# Patient Record
Sex: Male | Born: 1955 | Race: White | Hispanic: No | Marital: Married | Smoking: Never smoker
Health system: Southern US, Community
[De-identification: ages and names within clinical notes are randomized; demographics above are authoritative.]

## PROBLEM LIST (undated history)

## (undated) DIAGNOSIS — Z7901 Long term (current) use of anticoagulants: Secondary | ICD-10-CM

## (undated) DIAGNOSIS — Z9081 Acquired absence of spleen: Secondary | ICD-10-CM

## (undated) DIAGNOSIS — R0600 Dyspnea, unspecified: Secondary | ICD-10-CM

## (undated) DIAGNOSIS — J189 Pneumonia, unspecified organism: Secondary | ICD-10-CM

## (undated) DIAGNOSIS — M7701 Medial epicondylitis, right elbow: Secondary | ICD-10-CM

## (undated) DIAGNOSIS — E785 Hyperlipidemia, unspecified: Secondary | ICD-10-CM

## (undated) DIAGNOSIS — K219 Gastro-esophageal reflux disease without esophagitis: Secondary | ICD-10-CM

## (undated) DIAGNOSIS — I471 Supraventricular tachycardia, unspecified: Secondary | ICD-10-CM

## (undated) DIAGNOSIS — T4145XA Adverse effect of unspecified anesthetic, initial encounter: Secondary | ICD-10-CM

## (undated) DIAGNOSIS — R0989 Other specified symptoms and signs involving the circulatory and respiratory systems: Secondary | ICD-10-CM

## (undated) DIAGNOSIS — T466X5A Adverse effect of antihyperlipidemic and antiarteriosclerotic drugs, initial encounter: Secondary | ICD-10-CM

## (undated) DIAGNOSIS — K449 Diaphragmatic hernia without obstruction or gangrene: Secondary | ICD-10-CM

## (undated) DIAGNOSIS — T783XXA Angioneurotic edema, initial encounter: Secondary | ICD-10-CM

## (undated) DIAGNOSIS — I5189 Other ill-defined heart diseases: Secondary | ICD-10-CM

## (undated) DIAGNOSIS — R7303 Prediabetes: Secondary | ICD-10-CM

## (undated) DIAGNOSIS — C884 Extranodal marginal zone b-cell lymphoma of mucosa-associated lymphoid tissue (malt-lymphoma) not having achieved remission: Secondary | ICD-10-CM

## (undated) DIAGNOSIS — G43109 Migraine with aura, not intractable, without status migrainosus: Secondary | ICD-10-CM

## (undated) DIAGNOSIS — I2510 Atherosclerotic heart disease of native coronary artery without angina pectoris: Secondary | ICD-10-CM

## (undated) DIAGNOSIS — G4730 Sleep apnea, unspecified: Secondary | ICD-10-CM

## (undated) DIAGNOSIS — T8859XA Other complications of anesthesia, initial encounter: Secondary | ICD-10-CM

## (undated) DIAGNOSIS — M75102 Unspecified rotator cuff tear or rupture of left shoulder, not specified as traumatic: Secondary | ICD-10-CM

## (undated) DIAGNOSIS — G2581 Restless legs syndrome: Secondary | ICD-10-CM

## (undated) DIAGNOSIS — G4700 Insomnia, unspecified: Secondary | ICD-10-CM

## (undated) DIAGNOSIS — I82890 Acute embolism and thrombosis of other specified veins: Secondary | ICD-10-CM

## (undated) DIAGNOSIS — K5792 Diverticulitis of intestine, part unspecified, without perforation or abscess without bleeding: Secondary | ICD-10-CM

## (undated) DIAGNOSIS — G5711 Meralgia paresthetica, right lower limb: Secondary | ICD-10-CM

## (undated) DIAGNOSIS — Z9289 Personal history of other medical treatment: Secondary | ICD-10-CM

## (undated) DIAGNOSIS — G4733 Obstructive sleep apnea (adult) (pediatric): Secondary | ICD-10-CM

## (undated) DIAGNOSIS — K5790 Diverticulosis of intestine, part unspecified, without perforation or abscess without bleeding: Secondary | ICD-10-CM

## (undated) DIAGNOSIS — R3129 Other microscopic hematuria: Secondary | ICD-10-CM

## (undated) DIAGNOSIS — Z8489 Family history of other specified conditions: Secondary | ICD-10-CM

## (undated) DIAGNOSIS — Z87442 Personal history of urinary calculi: Secondary | ICD-10-CM

## (undated) DIAGNOSIS — M791 Myalgia, unspecified site: Secondary | ICD-10-CM

## (undated) DIAGNOSIS — C8590 Non-Hodgkin lymphoma, unspecified, unspecified site: Secondary | ICD-10-CM

## (undated) HISTORY — DX: Non-Hodgkin lymphoma, unspecified, unspecified site: C85.90

## (undated) HISTORY — DX: Diaphragmatic hernia without obstruction or gangrene: K44.9

## (undated) HISTORY — DX: Meralgia paresthetica, right lower limb: G57.11

## (undated) HISTORY — PX: HERNIA REPAIR: SHX51

## (undated) HISTORY — DX: Gastro-esophageal reflux disease without esophagitis: K21.9

## (undated) HISTORY — PX: SMALL INTESTINE SURGERY: SHX150

## (undated) HISTORY — DX: Diverticulosis of intestine, part unspecified, without perforation or abscess without bleeding: K57.90

## (undated) HISTORY — DX: Medial epicondylitis, right elbow: M77.01

## (undated) HISTORY — PX: SPLENECTOMY: SUR1306

## (undated) HISTORY — DX: Other microscopic hematuria: R31.29

## (undated) HISTORY — DX: Hyperlipidemia, unspecified: E78.5

## (undated) HISTORY — DX: Personal history of other medical treatment: Z92.89

## (undated) HISTORY — DX: Diverticulitis of intestine, part unspecified, without perforation or abscess without bleeding: K57.92

## (undated) HISTORY — DX: Other specified symptoms and signs involving the circulatory and respiratory systems: R09.89

## (undated) HISTORY — DX: Atherosclerotic heart disease of native coronary artery without angina pectoris: I25.10

## (undated) HISTORY — PX: COLONOSCOPY: SHX174

---
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
## 2005-12-28 ENCOUNTER — Ambulatory Visit: Payer: Self-pay | Admitting: Unknown Physician Specialty

## 2007-01-26 ENCOUNTER — Ambulatory Visit (HOSPITAL_COMMUNITY): Admission: RE | Admit: 2007-01-26 | Discharge: 2007-01-26 | Payer: Self-pay | Admitting: Cardiology

## 2007-01-26 ENCOUNTER — Ambulatory Visit: Payer: Self-pay | Admitting: Vascular Surgery

## 2007-12-21 HISTORY — PX: OTHER SURGICAL HISTORY: SHX169

## 2008-03-02 LAB — HM COLONOSCOPY: HM Colonoscopy: NORMAL

## 2008-09-26 DIAGNOSIS — R5381 Other malaise: Secondary | ICD-10-CM

## 2008-09-26 DIAGNOSIS — E785 Hyperlipidemia, unspecified: Secondary | ICD-10-CM

## 2008-09-26 DIAGNOSIS — I10 Essential (primary) hypertension: Secondary | ICD-10-CM

## 2008-09-26 DIAGNOSIS — K219 Gastro-esophageal reflux disease without esophagitis: Secondary | ICD-10-CM | POA: Insufficient documentation

## 2008-09-26 DIAGNOSIS — R5383 Other fatigue: Secondary | ICD-10-CM

## 2008-10-01 ENCOUNTER — Ambulatory Visit: Payer: Self-pay | Admitting: Internal Medicine

## 2008-10-01 DIAGNOSIS — G4730 Sleep apnea, unspecified: Secondary | ICD-10-CM

## 2008-10-01 DIAGNOSIS — K625 Hemorrhage of anus and rectum: Secondary | ICD-10-CM

## 2008-10-04 ENCOUNTER — Telehealth: Payer: Self-pay | Admitting: Internal Medicine

## 2008-10-09 ENCOUNTER — Ambulatory Visit: Payer: Self-pay | Admitting: Internal Medicine

## 2008-10-18 ENCOUNTER — Telehealth: Payer: Self-pay | Admitting: Internal Medicine

## 2008-10-24 ENCOUNTER — Encounter: Payer: Self-pay | Admitting: Internal Medicine

## 2008-11-13 ENCOUNTER — Ambulatory Visit: Payer: Self-pay | Admitting: Internal Medicine

## 2008-11-13 DIAGNOSIS — K3189 Other diseases of stomach and duodenum: Secondary | ICD-10-CM

## 2008-11-13 DIAGNOSIS — R1013 Epigastric pain: Secondary | ICD-10-CM

## 2008-12-26 ENCOUNTER — Encounter: Payer: Self-pay | Admitting: Internal Medicine

## 2009-03-31 ENCOUNTER — Telehealth: Payer: Self-pay | Admitting: Internal Medicine

## 2009-04-09 ENCOUNTER — Ambulatory Visit: Payer: Self-pay | Admitting: Internal Medicine

## 2009-04-09 DIAGNOSIS — R197 Diarrhea, unspecified: Secondary | ICD-10-CM

## 2009-06-04 ENCOUNTER — Ambulatory Visit: Payer: Self-pay | Admitting: Internal Medicine

## 2009-07-07 ENCOUNTER — Encounter: Admission: RE | Admit: 2009-07-07 | Discharge: 2009-07-07 | Payer: Self-pay | Admitting: Orthopedic Surgery

## 2009-08-18 ENCOUNTER — Inpatient Hospital Stay (HOSPITAL_COMMUNITY): Admission: RE | Admit: 2009-08-18 | Discharge: 2009-08-21 | Payer: Self-pay | Admitting: Surgery

## 2009-09-18 ENCOUNTER — Encounter: Payer: Self-pay | Admitting: Internal Medicine

## 2011-03-26 LAB — CBC
Hemoglobin: 13.4 g/dL (ref 13.0–17.0)
MCHC: 34.3 g/dL (ref 30.0–36.0)
Platelets: 227 10*3/uL (ref 150–400)
RDW: 14 % (ref 11.5–15.5)

## 2011-03-27 LAB — BASIC METABOLIC PANEL
Calcium: 9.5 mg/dL (ref 8.4–10.5)
GFR calc non Af Amer: >60 mL/min (ref >60)
Glucose, Bld: 87 mg/dL (ref 70–99)
Sodium: 141 mEq/L (ref 135–145)

## 2011-03-27 LAB — HEMOGLOBIN AND HEMATOCRIT, BLOOD: Hemoglobin: 14.5 g/dL (ref 13.0–17.0)

## 2011-05-04 NOTE — Op Note (Signed)
Brian Atkins, Brian Atkins                 ACCOUNT NO.:  [id number]   MEDICAL RECORD NO.:  [id number]          PATIENT TYPE:  AMB   LOCATION:  DAY                          FACILITY:  [facility]   PHYSICIAN:  Thornton Park. Daphine Deutscher, MD  DATE OF BIRTH:  [birth year]   DATE OF PROCEDURE:  08/18/2009  DATE OF DISCHARGE:                               OPERATIVE REPORT   PREOPERATIVE DIAGNOSIS:  Gastroesophageal reflux with small hiatal  hernia.   POSTOPERATIVE DIAGNOSIS:  Gastroesophageal reflux with small hiatal  hernia.   PROCEDURE:  Laparoscopic repair of hiatal hernia with three posterior  pledgeted sutures and a three suture Nissen wrap.   FINDINGS:  Evidence of moderate gastroesophageal reflux with  inflammatory changes in the EG junction.   SURGEON:  Dr. Daphine Deutscher   ASSISTANT:  Dr. Freida Busman   ANESTHESIA:  General endotracheal.   DESCRIPTION OF PROCEDURE:  Brian Atkins is a 52-year man taken to room  one on Monday, August 18, 2009 and given general anesthesia.  The  abdomen was prepped with a Techni-Care equivalent and draped sterilely.  I entered the abdomen through the left upper quadrant and 0 degree  OptiView without difficulty.  After entering, a 5 was placed lateral to  that, a 5 the upper midline for the [facility] retractor, two 11s on the  right and one slightly to the left umbilicus for the camera.   With the [facility] in place, I began the foregut dissection.  I took  down the gastrohepatic window.  I clipped first the small vessel and  there was no evidence of any change in the liver before I harmonized  that and then opened up the window completely up.  I went posterior  along the right crus and opened that window and then took down the  phrenoesophageal ligament and completely mobilized the esophagus  anteriorly and posteriorly including the left crus.  I took down short  gastrics and mobilized the cardia and a portion of the fundus.   I then placed three sutures with pledgeted sutures  posteriorly for the  closure and before tying the last one, I put in the 56 lighted bougie  and then secured the last one with tie knot.  I then brought the wrap  around the distal esophagus inside the vagus nerve which was lying  outside the wrap and this stayed in position and was under no tension.  I then found a contiguous portion of the stomach and wrapped it suturing  in place esophagus with three sutures using these 2-0 Surgitek and the  Endo stitch with tie knots in the top two and a free tie on the bottom  one.  I also tacked the left side of the stomach down to the stomach to  kind of prevent migration up behind the wrapped portion of the stomach  on the left.   There was no bleeding.  Everything looked good.  Pictures were taken and  the area was irrigated and the [place] removed.  Port sites were all  injected with Marcaine and closed 4-0 Vicryl with Benzoin and Steri-  Strips.  The patient tolerated the procedure well and was taken to the  recovery room in satisfactory condition.      Thornton Park Daphine Deutscher, MD  Electronically Signed     MBM/MEDQ  D:  08/18/2009  T:  08/18/2009  Job:  [id number]   cc:   Wilhemina Bonito. Marina Goodell, MD  [address]  [place]  [place] [address]

## 2011-06-07 ENCOUNTER — Other Ambulatory Visit: Payer: Self-pay | Admitting: Cardiology

## 2011-08-02 ENCOUNTER — Other Ambulatory Visit: Payer: Self-pay | Admitting: *Deleted

## 2011-08-02 MED ORDER — CARVEDILOL 6.25 MG PO TABS: 6.2500 mg | ORAL_TABLET | Freq: Two times a day (BID) | ORAL | Status: DC

## 2011-08-02 NOTE — Telephone Encounter (Signed)
escribe medication per fax request  

## 2011-10-04 ENCOUNTER — Ambulatory Visit: Payer: Self-pay | Admitting: Family Medicine

## 2011-10-25 ENCOUNTER — Ambulatory Visit (INDEPENDENT_AMBULATORY_CARE_PROVIDER_SITE_OTHER): Payer: BC Managed Care – PPO | Admitting: Internal Medicine

## 2011-10-25 ENCOUNTER — Encounter: Payer: Self-pay | Admitting: Internal Medicine

## 2011-10-25 VITALS — BP 112/74 | HR 70 | Temp 98.3°F | Resp 16 | Ht 73.5 in | Wt 201.8 lb

## 2011-10-25 DIAGNOSIS — M7701 Medial epicondylitis, right elbow: Secondary | ICD-10-CM

## 2011-10-25 DIAGNOSIS — M7700 Medial epicondylitis, unspecified elbow: Secondary | ICD-10-CM

## 2011-10-25 DIAGNOSIS — R5381 Other malaise: Secondary | ICD-10-CM

## 2011-10-25 DIAGNOSIS — I10 Essential (primary) hypertension: Secondary | ICD-10-CM

## 2011-10-25 DIAGNOSIS — E785 Hyperlipidemia, unspecified: Secondary | ICD-10-CM

## 2011-10-25 DIAGNOSIS — K2970 Gastritis, unspecified, without bleeding: Secondary | ICD-10-CM

## 2011-10-25 DIAGNOSIS — L918 Other hypertrophic disorders of the skin: Secondary | ICD-10-CM

## 2011-10-25 DIAGNOSIS — G5711 Meralgia paresthetica, right lower limb: Secondary | ICD-10-CM

## 2011-10-25 DIAGNOSIS — L909 Atrophic disorder of skin, unspecified: Secondary | ICD-10-CM

## 2011-10-25 DIAGNOSIS — R5383 Other fatigue: Secondary | ICD-10-CM

## 2011-10-25 DIAGNOSIS — E162 Hypoglycemia, unspecified: Secondary | ICD-10-CM

## 2011-10-25 DIAGNOSIS — G5710 Meralgia paresthetica, unspecified lower limb: Secondary | ICD-10-CM

## 2011-10-25 LAB — COMPREHENSIVE METABOLIC PANEL
ALT: 20 U/L (ref 0–53)
AST: 21 U/L (ref 0–37)
Albumin: 4.4 g/dL (ref 3.5–5.2)
BUN: 17 mg/dL (ref 6–23)
Calcium: 9.2 mg/dL (ref 8.4–10.5)
Chloride: 103 mEq/L (ref 96–112)
Potassium: 4.5 mEq/L (ref 3.5–5.1)

## 2011-10-25 LAB — LIPID PANEL
Cholesterol: 140 mg/dL (ref 0–200)
LDL Cholesterol: 83 mg/dL (ref 0–99)
Total CHOL/HDL Ratio: 4

## 2011-10-25 MED ORDER — HYOSCYAMINE SULFATE 0.125 MG SL SUBL: 0.1250 mg | SUBLINGUAL_TABLET | SUBLINGUAL | Status: DC | PRN

## 2011-10-25 MED ORDER — DEXLANSOPRAZOLE 30 MG PO CPDR: 30.0000 mg | DELAYED_RELEASE_CAPSULE | Freq: Every day | ORAL | Status: AC

## 2011-10-25 NOTE — Patient Instructions (Addendum)
Resume kapidex or dexilant daily .  Add 20 mg pepcid (famotidine) at bedtime if needed,  Ok to [employer] for immediate relief of reflux,  But for the cramping  Try hyoscyamine sublingual tablet   If no improvement in 2 weeks,  We should order a barium swallow and followup with Dr. Hyacinth Meeker  Add 500 mg of tylenol two times daily for the shoulder  (2000 mg total daily)

## 2011-10-25 NOTE — Progress Notes (Signed)
Subjective:    Patient ID: Brian Atkins, male    DOB: Dec 09, 1956, 55 y.o.   MRN: [id number]  HPI  55 yo male with a history of htn, hyperlipidemia,  Hiatal hernia s/p Nissen fundoplication 2009, presents for establishment of primary care.  Has several complaints: recurrence of reflux after being mostly symptom free for over 2 years. He describes reflux and epsiodes of severe substernal chest pain occurring 1 hr after eating.  He has had 6 such episodes since his surgery. He has a remote history of a normal stress test and recently walked up [employer] 1.5 hr trail and had no chest pain during event.   2nd complaint is that his 2nd and 3rd toes turn blue in the winter, only in the left foot. There is some pain associated with the color change.  Had a doppler ultrasound done by Clelia Croft several months ago and it was reportedly  normal.   HIs 3rd issue is persistent right anterior thigh numbness for the past 6 months.  He had an evaluation by neurologist Dr. Clelia Croft who diagnosed lateral femoral cutaneous nerve syndrome.  He has not seen an improvement but conitnue to wear belts and constrictive undergarments due to misunderstanding.  His 4th complaint is left shoulder soreness as well as pain his his right elbow.  NO history of trauma.  He is taking naproxen daily  Which provides minimal relief.    Review of Systems  Constitutional: Negative for fever, chills, diaphoresis, activity change, appetite change, fatigue and unexpected weight change.  HENT: Negative for hearing loss, ear pain, nosebleeds, congestion, sore throat, facial swelling, rhinorrhea, sneezing, drooling, mouth sores, trouble swallowing, neck pain, neck stiffness, dental problem, voice change, postnasal drip, sinus pressure, tinnitus and ear discharge.   Eyes: Negative for photophobia, pain, discharge, redness, itching and visual disturbance.  Respiratory: Negative for apnea, cough, choking, chest tightness, shortness of breath, wheezing and  stridor.   Cardiovascular: Positive for chest pain. Negative for palpitations and leg swelling.  Gastrointestinal: Negative for nausea, vomiting, abdominal pain, diarrhea, constipation, blood in stool, abdominal distention, anal bleeding and rectal pain.  Genitourinary: Negative for dysuria, urgency, frequency, hematuria, flank pain, decreased urine volume, scrotal swelling, difficulty urinating and testicular pain.  Musculoskeletal: Positive for arthralgias. Negative for myalgias, back pain, joint swelling and gait problem.  Skin: Positive for color change. Negative for rash and wound.  Neurological: Positive for numbness. Negative for dizziness, tremors, seizures, syncope, speech difficulty, weakness, light-headedness and headaches.  Psychiatric/Behavioral: Negative for suicidal ideas, hallucinations, behavioral problems, confusion, sleep disturbance, dysphoric mood, decreased concentration and agitation. The patient is not nervous/anxious.        Objective:   Physical Exam  Constitutional: He is oriented to person, place, and time.  HENT:  Head: Normocephalic and atraumatic.  Mouth/Throat: Oropharynx is clear and moist.  Eyes: Conjunctivae and EOM are normal.  Neck: Normal range of motion. Neck supple. No JVD present. No thyromegaly present.  Cardiovascular: Normal rate, regular rhythm, normal heart sounds and normal pulses.        Cap refill sluggish left foot toes 2 and 3  Pulmonary/Chest: Effort normal and breath sounds normal. He has no wheezes. He has no rales.  Abdominal: Soft. Bowel sounds are normal. He exhibits no mass. There is no tenderness. There is no rebound.  Musculoskeletal: Normal range of motion. He exhibits tenderness. He exhibits no edema.       Arms: Neurological: He is alert and oriented to person, place, and time.  Skin: Skin is warm and dry.  Psychiatric: He has a normal mood and affect.      Notable fo rdecreased cap refill in ttoes 2 and 3    Assessment  & Plan:  Chest pain:  Unlikely to be cardiac given its timing.   May be esophageal spasm from recurrent reflux vs problem with the Nissen fundoplication. Resume PPI, a trial of hyoscyamine , consider workup for CREST given and refer back to GI  May need barium swallow but will need to discuss with radiology.

## 2011-10-26 ENCOUNTER — Encounter: Payer: Self-pay | Admitting: Internal Medicine

## 2011-10-26 DIAGNOSIS — M7701 Medial epicondylitis, right elbow: Secondary | ICD-10-CM | POA: Insufficient documentation

## 2011-10-26 DIAGNOSIS — R3121 Asymptomatic microscopic hematuria: Secondary | ICD-10-CM | POA: Insufficient documentation

## 2011-10-26 DIAGNOSIS — R0989 Other specified symptoms and signs involving the circulatory and respiratory systems: Secondary | ICD-10-CM | POA: Insufficient documentation

## 2011-10-26 DIAGNOSIS — G5711 Meralgia paresthetica, right lower limb: Secondary | ICD-10-CM | POA: Insufficient documentation

## 2011-10-26 DIAGNOSIS — K649 Unspecified hemorrhoids: Secondary | ICD-10-CM | POA: Insufficient documentation

## 2011-10-26 DIAGNOSIS — R3129 Other microscopic hematuria: Secondary | ICD-10-CM | POA: Insufficient documentation

## 2011-10-26 DIAGNOSIS — E785 Hyperlipidemia, unspecified: Secondary | ICD-10-CM | POA: Insufficient documentation

## 2011-10-26 LAB — TESTOSTERONE, FREE, TOTAL, SHBG: Testosterone-% Free: 2.1 % (ref 1.6–2.9)

## 2011-10-26 NOTE — Assessment & Plan Note (Signed)
LDL 80, HDL 24 in Sept 2011.  Repeat lipids are  Due/

## 2011-10-26 NOTE — Assessment & Plan Note (Signed)
currenlty well controlled on carvedilol and losartan/ no changes

## 2011-10-26 NOTE — Assessment & Plan Note (Signed)
He was treated with a steroid taper in Late July with improvement in symptoms but continues to overuse his joints at work.

## 2011-10-26 NOTE — Assessment & Plan Note (Signed)
Explained source of problem and need for him to change to boxer shorts and suspenders (avoid belts) to giave the entrapped nervie a time to decomopresso  Minimum of 6 months

## 2011-11-23 ENCOUNTER — Other Ambulatory Visit: Payer: Self-pay | Admitting: Internal Medicine

## 2011-11-24 MED ORDER — CARVEDILOL 6.25 MG PO TABS: 6.2500 mg | ORAL_TABLET | Freq: Two times a day (BID) | ORAL | Status: DC

## 2011-11-29 ENCOUNTER — Telehealth: Payer: Self-pay | Admitting: Internal Medicine

## 2011-11-29 NOTE — Telephone Encounter (Signed)
Left message asking patient to return my call.

## 2011-11-29 NOTE — Telephone Encounter (Signed)
[phone]  Pt went to get rx at pharmacy and they told him that he need to make an appointment with his md.  He as an appointment on 12/07/11 for cpx. [facility] He needs refills on all his meds Does he need to come see dr [name] before 12/18  He was seen by dr [name] 10/25/11 Please call and advise pt,

## 2011-12-07 ENCOUNTER — Ambulatory Visit (INDEPENDENT_AMBULATORY_CARE_PROVIDER_SITE_OTHER): Payer: BC Managed Care – PPO | Admitting: Internal Medicine

## 2011-12-07 ENCOUNTER — Encounter: Payer: Self-pay | Admitting: Internal Medicine

## 2011-12-07 DIAGNOSIS — G4730 Sleep apnea, unspecified: Secondary | ICD-10-CM

## 2011-12-07 DIAGNOSIS — Z1211 Encounter for screening for malignant neoplasm of colon: Secondary | ICD-10-CM

## 2011-12-07 DIAGNOSIS — G4710 Hypersomnia, unspecified: Secondary | ICD-10-CM | POA: Insufficient documentation

## 2011-12-07 DIAGNOSIS — I10 Essential (primary) hypertension: Secondary | ICD-10-CM

## 2011-12-07 DIAGNOSIS — R1013 Epigastric pain: Secondary | ICD-10-CM

## 2011-12-07 DIAGNOSIS — Z125 Encounter for screening for malignant neoplasm of prostate: Secondary | ICD-10-CM

## 2011-12-07 DIAGNOSIS — E785 Hyperlipidemia, unspecified: Secondary | ICD-10-CM

## 2011-12-07 DIAGNOSIS — K3189 Other diseases of stomach and duodenum: Secondary | ICD-10-CM

## 2011-12-07 NOTE — Assessment & Plan Note (Signed)
DRE normal today,  Return for PSA on Friday

## 2011-12-07 NOTE — Assessment & Plan Note (Signed)
Done at age 55 for rectal bleeding.  Repeat at age 66

## 2011-12-07 NOTE — Progress Notes (Signed)
Subjective:    Patient ID: Daivik Overley, male    DOB: December 29, 1955, 55 y.o.   MRN: [id number]  HPI  Mr. Bognar is a healthy 55 yo white male with a history of hypertension, hiatal hernia and    BP 107/74  Pulse 78  Temp(Src) 98.7 F (37.1 C) (Oral)  Ht 6\' 1"  (1.854 m)  Wt 204 lb 4 oz (92.647 kg)  BMI 26.95 kg/m2  SpO2 97% recurrent left shoulder pain and bilteral elbow pain who present for his annual physical exam. 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 Relieved with occasional Alleve .No prior orthopedic evaluations or steroid injections but has had prior PT therapy which were not helpful .   2d complaint is decreased libido and weight gain .  Not exercising,  Falls asleep as soon as he comes home and eats a sandwhich, frequentl skips meals,  Snores a lot but no prior apnea test and sleeps in separate room from wife.   Past Medical History  Diagnosis Date  . Medial epicondylitis of right elbow   . Meralgia paresthetica of right side   . Microscopic hematuria   . Dyslipidemia   . Labile hypertension   . Hemorrhoids    Past Surgical History  Procedure Date  . Nissen funduplication 2009    Juliane Lack    Current Outpatient Prescriptions on File Prior to Visit  Medication Sig Dispense Refill  . carvedilol (COREG) 6.25 MG tablet Take 1 tablet (6.25 mg total) by mouth 2 (two) times daily.  60 tablet  2  . fluticasone (FLONASE) 50 MCG/ACT nasal spray Place 2 sprays into the nose daily.        [name] losartan-hydrochlorothiazide (HYZAAR) 100-25 MG per tablet Take 1 tablet by mouth daily.        . naproxen (NAPROSYN) 500 MG tablet Take 500 mg by mouth 2 (two) times daily as needed.       . simvastatin (ZOCOR) 20 MG tablet Take 20 mg by mouth at bedtime.           Review of Systems  Constitutional: Negative for fever, chills, diaphoresis, activity change, appetite change, fatigue and unexpected weight change.  HENT: Negative for hearing loss, ear pain,  nosebleeds, congestion, sore throat, facial swelling, rhinorrhea, sneezing, drooling, mouth sores, trouble swallowing, neck pain, neck stiffness, dental problem, voice change, postnasal drip, sinus pressure, tinnitus and ear discharge.   Eyes: Negative for photophobia, pain, discharge, redness, itching and visual disturbance.  Respiratory: Negative for apnea, cough, choking, chest tightness, shortness of breath, wheezing and stridor.   Cardiovascular: Negative for chest pain, palpitations and leg swelling.  Gastrointestinal: Negative for nausea, vomiting, abdominal pain, diarrhea, constipation, blood in stool, abdominal distention, anal bleeding and rectal pain.  Genitourinary: Negative for dysuria, urgency, frequency, hematuria, flank pain, decreased urine volume, scrotal swelling, difficulty urinating and testicular pain.  Musculoskeletal: Negative for myalgias, back pain, joint swelling, arthralgias and gait problem.  Skin: Negative for color change, rash and wound.  Neurological: Negative for dizziness, tremors, seizures, syncope, speech difficulty, weakness, light-headedness, numbness and headaches.  Psychiatric/Behavioral: Negative for suicidal ideas, hallucinations, behavioral problems, confusion, sleep disturbance, dysphoric mood, decreased concentration and agitation. The patient is not nervous/anxious.        BP 107/74  Pulse 78  Temp(Src) 98.7 F (37.1 C) (Oral)  Ht 6\' 1"  (1.854 m)  Wt 204 lb 4 oz (92.647 kg)  BMI 26.95 kg/m2  SpO2 97%  Objective:   Physical Exam  Constitutional: He is oriented to person, place, and time.  HENT:  Head: Normocephalic and atraumatic.  Mouth/Throat: Oropharynx is clear and moist.  Eyes: Conjunctivae and EOM are normal.  Neck: Normal range of motion. Neck supple. No JVD present. No thyromegaly present.  Cardiovascular: Normal rate, regular rhythm and normal heart sounds.   Pulmonary/Chest: Effort normal and breath sounds normal. He has no  wheezes. He has no rales.  Abdominal: Soft. Bowel sounds are normal. He exhibits no mass. There is no tenderness. There is no rebound.  Genitourinary: Prostate normal and penis normal.  Musculoskeletal: Normal range of motion. He exhibits no edema.  Neurological: He is alert and oriented to person, place, and time.  Skin: Skin is warm and dry.  Psychiatric: He has a normal mood and affect.       Assessment & Plan:   SLEEP APNEA Apparently patient has had a sleep study and does not want to use CPAP for dx.  Will recommend weight loss, consider repeat study in 6 months  HYPERTENSION Now well controlled. Normal renal function,  No changes  HYPERLIPIDEMIA Near goal of LDL 100 .  recommended mediterranean diet and starch restriction.   DYSPEPSIA His symptoms solved after last visit and  he is not using PPI on a regular basis despite my advice.   Special screening for malignant neoplasm of prostate DRE normal today,  Return for PSA on Friday  Screening for colon cancer Done at age 55 for rectal bleeding.  Repeat at age 55    Updated Medication List Outpatient Encounter Prescriptions as of 12/07/2011  Medication Sig Dispense Refill  . carvedilol (COREG) 6.25 MG tablet Take 1 tablet (6.25 mg total) by mouth 2 (two) times daily.  60 tablet  2  . fluticasone (FLONASE) 50 MCG/ACT nasal spray Place 2 sprays into the nose daily.        [name] losartan-hydrochlorothiazide (HYZAAR) 100-25 MG per tablet Take 1 tablet by mouth daily.        . naproxen (NAPROSYN) 500 MG tablet Take 500 mg by mouth 2 (two) times daily as needed.       . simvastatin (ZOCOR) 20 MG tablet Take 20 mg by mouth at bedtime.         Total visit time 45 minutes spent answering additional questions on chronic medical issues.

## 2011-12-07 NOTE — Assessment & Plan Note (Signed)
Apparently patient has had a sleep study and does not want to use CPAP for dx.  Will recommend weight loss, consider repeat study in 6 months

## 2011-12-07 NOTE — Assessment & Plan Note (Addendum)
His symptoms solved after last visit and  he is not using PPI on a regular basis despite my advice.

## 2011-12-07 NOTE — Assessment & Plan Note (Signed)
Near goal of LDL 100 .  recommended mediterranean diet and starch restriction.

## 2011-12-07 NOTE — Assessment & Plan Note (Signed)
Now well controlled. Normal renal function,  No changes

## 2012-02-02 ENCOUNTER — Emergency Department: Payer: Self-pay | Admitting: Emergency Medicine

## 2012-02-02 LAB — CBC
HCT: 47.7 % (ref 40.0–52.0)
MCH: 29.1 pg (ref 26.0–34.0)
MCHC: 34.5 g/dL (ref 32.0–36.0)
Platelet: 249 10*3/uL (ref 150–440)
RDW: 13.5 % (ref 11.5–14.5)
WBC: 12.4 10*3/uL — ABNORMAL HIGH (ref 3.8–10.6)

## 2012-02-02 LAB — COMPREHENSIVE METABOLIC PANEL
Alkaline Phosphatase: 54 U/L (ref 50–136)
Anion Gap: 10 (ref 7–16)
BUN: 24 mg/dL — ABNORMAL HIGH (ref 7–18)
Bilirubin,Total: 0.5 mg/dL (ref 0.2–1.0)
EGFR (Non-African Amer.): >60
Glucose: 114 mg/dL — ABNORMAL HIGH (ref 65–99)
Potassium: 3.9 mmol/L (ref 3.5–5.1)
SGOT(AST): 24 U/L (ref 15–37)
SGPT (ALT): 23 U/L
Sodium: 143 mmol/L (ref 136–145)
Total Protein: 7.9 g/dL (ref 6.4–8.2)

## 2012-02-03 LAB — CK TOTAL AND CKMB (NOT AT ARMC): CK, Total: 61 U/L (ref 35–232)

## 2012-02-03 LAB — TROPONIN I: Troponin-I: <0.02 ng/mL

## 2012-03-06 ENCOUNTER — Other Ambulatory Visit: Payer: Self-pay | Admitting: Internal Medicine

## 2012-03-06 MED ORDER — CARVEDILOL 6.25 MG PO TABS: 6.2500 mg | ORAL_TABLET | Freq: Two times a day (BID) | ORAL | Status: DC

## 2012-06-29 ENCOUNTER — Other Ambulatory Visit: Payer: Self-pay | Admitting: *Deleted

## 2012-06-29 MED ORDER — LOSARTAN POTASSIUM-HCTZ 100-25 MG PO TABS: 1.0000 | ORAL_TABLET | Freq: Every day | ORAL | Status: DC

## 2012-06-30 ENCOUNTER — Telehealth: Payer: Self-pay | Admitting: Internal Medicine

## 2012-06-30 MED ORDER — LOSARTAN POTASSIUM-HCTZ 100-25 MG PO TABS: 1.0000 | ORAL_TABLET | Freq: Every day | ORAL | Status: DC

## 2012-06-30 NOTE — Telephone Encounter (Signed)
Refill on Losartan 100-25 mg.

## 2012-06-30 NOTE — Telephone Encounter (Signed)
Rx called in 

## 2012-07-30 ENCOUNTER — Other Ambulatory Visit: Payer: Self-pay | Admitting: Internal Medicine

## 2012-11-05 ENCOUNTER — Other Ambulatory Visit: Payer: Self-pay | Admitting: Internal Medicine

## 2012-11-06 ENCOUNTER — Telehealth: Payer: Self-pay | Admitting: Internal Medicine

## 2012-11-06 MED ORDER — LOSARTAN POTASSIUM-HCTZ 100-25 MG PO TABS: 1.0000 | ORAL_TABLET | Freq: Every day | ORAL | Status: DC

## 2012-11-06 NOTE — Telephone Encounter (Signed)
Losartan -Hydrochlorothiazide 100-25 mg # 30 2R sent electronic to [facility].Patient advised that a follow up appt is need for further refills.

## 2012-11-06 NOTE — Telephone Encounter (Signed)
Will refer to Dr. [name]

## 2012-11-06 NOTE — Telephone Encounter (Signed)
Pt spouse called he is completely out of  Losartan hctz 100-25 [facility]

## 2012-11-06 NOTE — Telephone Encounter (Signed)
Losartan 100-25 mg # 30 2 R [facility].

## 2013-01-23 ENCOUNTER — Other Ambulatory Visit: Payer: Self-pay | Admitting: Internal Medicine

## 2013-01-23 ENCOUNTER — Other Ambulatory Visit: Payer: Self-pay | Admitting: General Practice

## 2013-01-23 MED ORDER — CARVEDILOL 6.25 MG PO TABS: ORAL_TABLET | ORAL | Status: DC

## 2013-03-02 ENCOUNTER — Encounter: Payer: Self-pay | Admitting: Internal Medicine

## 2013-03-02 ENCOUNTER — Ambulatory Visit (INDEPENDENT_AMBULATORY_CARE_PROVIDER_SITE_OTHER): Payer: No Typology Code available for payment source | Admitting: Internal Medicine

## 2013-03-02 VITALS — BP 112/78 | HR 67 | Temp 98.0°F | Resp 16 | Ht 73.0 in | Wt 204.2 lb

## 2013-03-02 DIAGNOSIS — K219 Gastro-esophageal reflux disease without esophagitis: Secondary | ICD-10-CM

## 2013-03-02 DIAGNOSIS — G4730 Sleep apnea, unspecified: Secondary | ICD-10-CM

## 2013-03-02 DIAGNOSIS — R7309 Other abnormal glucose: Secondary | ICD-10-CM

## 2013-03-02 DIAGNOSIS — Z79899 Other long term (current) drug therapy: Secondary | ICD-10-CM

## 2013-03-02 DIAGNOSIS — Z13220 Encounter for screening for lipoid disorders: Secondary | ICD-10-CM

## 2013-03-02 DIAGNOSIS — Z125 Encounter for screening for malignant neoplasm of prostate: Secondary | ICD-10-CM

## 2013-03-02 LAB — COMPREHENSIVE METABOLIC PANEL
Albumin: 4.4 g/dL (ref 3.5–5.2)
Alkaline Phosphatase: 62 U/L (ref 39–117)
CO2: 27 mEq/L (ref 19–32)
Glucose, Bld: 93 mg/dL (ref 70–99)
Potassium: 4 mEq/L (ref 3.5–5.1)
Sodium: 138 mEq/L (ref 135–145)
Total Protein: 7.9 g/dL (ref 6.0–8.3)

## 2013-03-02 LAB — CBC WITH DIFFERENTIAL/PLATELET
Eosinophils Relative: 2.4 % (ref 0.0–5.0)
Lymphocytes Relative: 43.6 % (ref 12.0–46.0)
Monocytes Absolute: 0.6 10*3/uL (ref 0.1–1.0)
Monocytes Relative: 6.5 % (ref 3.0–12.0)
Neutrophils Relative %: 47 % (ref 43.0–77.0)
Platelets: 276 10*3/uL (ref 150.0–400.0)
WBC: 8.8 10*3/uL (ref 4.5–10.5)

## 2013-03-02 LAB — LIPID PANEL
Cholesterol: 212 mg/dL — ABNORMAL HIGH (ref 0–200)
Total CHOL/HDL Ratio: 8

## 2013-03-02 LAB — PSA: PSA: 1.92 ng/mL (ref 0.10–4.00)

## 2013-03-02 NOTE — Progress Notes (Signed)
Patient ID: Brian Atkins, male   DOB: August 26, 1956, 57 y.o.   MRN: [id number].  The patient is here for his annual male physical examination and management of other chronic and acute problems.   The risk factors are reflected in the social history.  The roster of all physicians providing medical care to patient - is listed in the Snapshot section of the chart.  Activities of daily living:  The patient is 100% independent in all ADLs: dressing, toileting, feeding as well as independent mobility  Home safety : The patient has smoke detectors in the home. He wears seatbelts.  There are no firearms at home. There is no violence in the home.   There is no risks for hepatitis, STDs or HIV. There is no   history of blood transfusion. There is no travel history to infectious disease endemic areas of the world.  The patient has seen their dentist in the last six month and  their eye doctor in the last year.  They do not  have excessive sun exposure. They have seen a dermatoloigist in the last year. Discussed the need for sun protection: hats, long sleeves and use of sunscreen if there is significant sun exposure.   Diet: the importance of a healthy diet is discussed. They do have a healthy diet.  The benefits of regular aerobic exercise were discussed. He exercises a minimum of 30 minutes  5 days per week. Depression screen: there are no signs or vegative symptoms of depression- irritability, change in appetite, anhedonia, sadness/tearfullness.  The following portions of the patient's history were reviewed and updated as appropriate: allergies, current medications, past family history, past medical history,  past surgical history, past social history  and problem list.  Visual acuity was not assessed per patient preference since he has regular follow up with his ophthalmologist. Hearing and body mass index were assessed and reviewed.   During the course of the visit the patient was educated and counseled  about appropriate screening and preventive services including :  nutrition counseling, colorectal cancer screening, and recommended immunizations.    Objective  BP 112/78  Pulse 67  Temp(Src) 98 F (36.7 C) (Oral)  Resp 16  Ht 6\' 1"  (1.854 m)  Wt 204 lb 4 oz (92.647 kg)  BMI 26.95 kg/m2  SpO2 95%  General Appearance:    Alert, cooperative, no distress, appears stated age  Head:    Normocephalic, without obvious abnormality, atraumatic  Eyes:    PERRL, conjunctiva/corneas clear, EOM's intact, fundi    benign, both eyes       Ears:    Normal TM's and external ear canals, both ears  Nose:   Nares normal, septum midline, mucosa normal, no drainage   or sinus tenderness  Throat:   Lips, mucosa, and tongue normal; teeth and gums normal  Neck:   Supple, symmetrical, trachea midline, no adenopathy;       thyroid:  No enlargement/tenderness/nodules; no carotid   bruit or JVD  Back:     Symmetric, no curvature, ROM normal, no CVA tenderness  Lungs:     Clear to auscultation bilaterally, respirations unlabored  Chest wall:    No tenderness or deformity  Heart:    Regular rate and rhythm, S1 and S2 normal, no murmur, rub   or gallop  Abdomen:     Soft, non-tender, bowel sounds active all four quadrants,    no masses, no organomegaly  Genitalia:    Normal male without lesion, discharge or  tenderness  Rectal:    Normal tone, normal prostate, no masses or tenderness;   guaiac negative stool  Extremities:   Extremities normal, atraumatic, no cyanosis or edema  Pulses:   2+ and symmetric all extremities  Skin:   Skin color, texture, turgor normal, no rashes or lesions  Lymph nodes:   Cervical, supraclavicular, and axillary nodes normal  Neurologic:   CNII-XII intact. Normal strength, sensation and reflexes      throughout   Assessment and Plan  Special screening for malignant neoplasm of prostate Annual male exam was done including testicular and prostate exam. PSA is < 2.0  Routine  general medical examination at a health care facility Annual male exam was done including testicular and prostate exam. PSA is pending .  Colon ca screening was reviewed and options given.    HYPERLIPIDEMIA His untreated cholesterol is fine except for a very low HDL. recommended Mediterranean lifestyle diet  HYPERTENSION Well controlled on current regimen. Renal function stable, no changes today.  GERD Recommended daily use of a PPI or H2 blocker for management of symptoms.  SLEEP APNEA Managed with weight loss as patient refused to use CPAP for treatment. He currently has no symptoms.   Updated Medication List Outpatient Encounter Prescriptions as of 03/02/2013  Medication Sig Dispense Refill  . carvedilol (COREG) 6.25 MG tablet TAKE ONE TABLET BY MOUTH TWICE DAILY  120 tablet  0  . fluticasone (FLONASE) 50 MCG/ACT nasal spray Place 2 sprays into the nose daily.        [name] losartan-hydrochlorothiazide (HYZAAR) 100-25 MG per tablet Take 1 tablet by mouth daily.  30 tablet  2  . naproxen (NAPROSYN) 500 MG tablet Take 500 mg by mouth 2 (two) times daily as needed.       . [DISCONTINUED] simvastatin (ZOCOR) 20 MG tablet Take 20 mg by mouth at bedtime.        No facility-administered encounter medications on file as of 03/02/2013.

## 2013-03-02 NOTE — Patient Instructions (Addendum)
We did your annual exam today and everything was normal  We are checking cholesterol, PSA, thyroid, etc and you will be notified of your results  Regarding your stomach complaints"  Try Mylanta Gas for the episode  of belly pain. Slow down eating,  Talk  less during eating (swallow less air )  And try resuming nexium or prilosec   Regarding your low blood sugars ,  I will check a hgba1c today , and you may want to try the following diet   This is  One version of a  "Low GI"  Diet:  It is not ultra low carb, but will still lower your blood sugars and allow you to lose 5 to 15lbs per month if you follow it carefully and combine it with 30 minutes of aeroric exercise 5 days per week .   All of the foods can be found at grocery stores and in bulk at [employer].  The Atkins protein bars and shakes are available in more varieties at [employer], [employer] and [employer].     7 AM Breakfast:  Low carbohydrate Protein  Shakes (I recommend the EAS AdvantEdge "Carb Control" shakes  Or the low carb shakes by Atkins.   Both are available everywhere:  In  cases at BJs  Or in 4 packs at grocery stores and pharmacies  2.5 carbs  (Alternative is  a toasted Arnold's Sandwhich Thin w/ peanut butter, a "Bagel Thin" with cream cheese and salmon) or  a scrambled egg burrito made with a low carb tortilla .  Avoid cereal and bananas, oatmeal too unless you are cooking the old fashioned kind that takes 30-40 minutes to prepare.  the rest is overly processed, has minimal fiber, and is loaded with carbohydrates!   10 AM: Protein bar by Atkins (the snack size, under 200 cal).  There are many varieties , available widely again or in bulk in limited varieties at BJs)  Other so called "protein bars" tend to be loaded with carbohydrates.  Remember, in food advertising, the word "energy" is synonymous for " carbohydrate."  Lunch: sandwich of [place], (or any lunchmeat, grilled meat or canned tuna), fresh avocado, mayonnaise  and  cheese on a lower carbohydrate pita bread, flatbread, or tortilla . Ok to use regular mayonnaise. The bread is the only source or carbohydrate that can be decreased (Joseph's makes a pita bread and a flat bread that are 50 cal and 4 net carbs ; Toufayan makes a low carb flatbread that's 100 cal and 9 net carbs  and  Mission makes a low carb whole wheat tortilla  That is 210 cal and 6 net carbs)  3 PM:  Mid day :  Another protein bar,  Or a  cheese stick (100 cal, 0 carbs),  Or 1 ounce of  almonds, walnuts, pistachios, pecans, peanuts,  Macadamia nuts. Or a Dannon light n Fit greek yogurt, 80 cal 8 net carbs . Avoid "granola"; the dried cranberries and raisins are loaded with carbohydrates. Mixed nuts ok if no raisins or cranberries or dried fruit.      6 PM  Dinner:  "mean and green:"  Meat/chicken/fish or a high protein legume; , with a green salad, and a low GI  Veggie (broccoli, cauliflower, green beans, spinach, brussel sprouts. Lima beans) : Avoid "Low fat dressings, as well as Reyne Dumas and [address]! They are loaded with sugar! Instead use ranch, vinagrette,  Blue cheese, etc.  There is a low carb pasta  by Dreamfield's available at [facility] grocery that is acceptable and tastes great. Try Michel Angelo's chicken piccata over low carb pasta. The chicken dish is 0 carbs, and can be found in frozen section at BJs and Lowe's. Also try [employer]" (pulled pork, no sauce,  0 carbs) and his pot roast.   both are in the refrigerated section at BJs   Dreamfield's makes a low carb pasta only 5 g/serving.  Available at all grocery stores,  And tastes like normal pasta  9 PM snack : Breyer's "low carb" fudgsicle or  ice cream bar (Carb Smart line), or  Weight Watcher's ice cream bar , or another "no sugar added" ice cream;a serving of fresh berries/cherries with whipped cream (Avoid bananas, pineapple, grapes  and watermelon on a regular basis because they are high in sugar)   Remember that  snack Substitutions should be less than 10 carbs per serving and meals < 20 carbs. Remember to subtract fiber grams and sugar alcohols to get the "net carbs."

## 2013-03-04 DIAGNOSIS — Z0001 Encounter for general adult medical examination with abnormal findings: Secondary | ICD-10-CM | POA: Insufficient documentation

## 2013-03-04 NOTE — Assessment & Plan Note (Signed)
Annual male exam was done including testicular and prostate exam. PSA is pending .  Colon ca screening was reviewed and options given.   

## 2013-03-04 NOTE — Assessment & Plan Note (Signed)
Managed with weight loss as patient refused to use CPAP for treatment. He currently has no symptoms.

## 2013-03-04 NOTE — Assessment & Plan Note (Signed)
Recommended daily use of a PPI or H2 blocker for management of symptoms.

## 2013-03-04 NOTE — Assessment & Plan Note (Addendum)
His untreated cholesterol is fine except for a very low HDL. recommended Mediterranean lifestyle diet

## 2013-03-04 NOTE — Assessment & Plan Note (Signed)
Annual male exam was done including testicular and prostate exam. PSA is < 2.0

## 2013-03-04 NOTE — Assessment & Plan Note (Signed)
Well controlled on current regimen. Renal function stable, no changes today. 

## 2013-03-11 ENCOUNTER — Other Ambulatory Visit: Payer: Self-pay | Admitting: Internal Medicine

## 2013-04-08 ENCOUNTER — Other Ambulatory Visit: Payer: Self-pay | Admitting: Internal Medicine

## 2013-04-09 NOTE — Telephone Encounter (Signed)
Rx sent to pharmacy by escript  

## 2013-07-14 ENCOUNTER — Other Ambulatory Visit: Payer: Self-pay | Admitting: Internal Medicine

## 2013-10-17 ENCOUNTER — Other Ambulatory Visit: Payer: Self-pay | Admitting: Internal Medicine

## 2013-10-23 ENCOUNTER — Encounter: Payer: Self-pay | Admitting: Internal Medicine

## 2013-10-23 ENCOUNTER — Ambulatory Visit (INDEPENDENT_AMBULATORY_CARE_PROVIDER_SITE_OTHER): Payer: BC Managed Care – PPO | Admitting: Internal Medicine

## 2013-10-23 VITALS — BP 118/78 | HR 67 | Temp 97.5°F | Resp 12 | Ht 73.0 in | Wt 203.0 lb

## 2013-10-23 DIAGNOSIS — M25429 Effusion, unspecified elbow: Secondary | ICD-10-CM

## 2013-10-23 DIAGNOSIS — M25421 Effusion, right elbow: Secondary | ICD-10-CM | POA: Insufficient documentation

## 2013-10-23 NOTE — Progress Notes (Signed)
Patient ID: Brian Atkins, male   DOB: 07-29-1956, 57 y.o.   MRN: [id number]  Patient Active Problem List   Diagnosis Date Noted  . Effusion of right olecranon bursa 10/23/2013  . Routine general medical examination at a health care facility 03/04/2013  . Special screening for malignant neoplasm of prostate 12/07/2011  . Screening for colon cancer 12/07/2011  . Microscopic hematuria   . Hemorrhoids   . SLEEP APNEA 10/01/2008  . HYPERLIPIDEMIA 09/26/2008  . HYPERTENSION 09/26/2008  . GERD 09/26/2008    Subjective:  CC:   Chief Complaint  Patient presents with  . Acute Visit    Right Elbow swollen and painful to lift weight.    HPI:   Brian Atkins a 57 y.o. male who presents with painful swollen elbow,  History of blunt trauma to elbow on truck machinery last week, but  ignored it. Has noticed that it has been very tender to direct pressure.  Came in today because his wife noticed considerable swelling last night.  Has been taking  advil on and off for other joint pains.  . Does not take aspirin .   Has developed some numbness and tingling in the right hand on the ulnar side.         Past Medical History  Diagnosis Date  . Medial epicondylitis of right elbow   . Meralgia paresthetica of right side   . Microscopic hematuria   . Dyslipidemia   . Labile hypertension   . Hemorrhoids     Past Surgical History  Procedure Laterality Date  . Nissen funduplication  2009    Juliane Lack       The following portions of the patient's history were reviewed and updated as appropriate: Allergies, current medications, and problem list.    Review of Systems:   12 Pt  review of systems was negative except those addressed in the HPI,     History   Social History  . Marital Status: Married    Spouse Name: N/A    Number of Children: N/A  . Years of Education: 14   Occupational History  . [employer]   Social History Main Topics  . Smoking status:  Never Smoker   . Smokeless tobacco: Never Used  . Alcohol Use: No  . Drug Use: No  . Sexual Activity: Not on file   Other Topics Concern  . Not on file   Social History Narrative  . No narrative on file    Objective:  Filed Vitals:   10/23/13 0858  BP: 118/78  Pulse: 67  Temp: 97.5 F (36.4 C)  Resp: 12     General appearance: alert, cooperative and appears stated age Ears: normal TM's and external ear canals both ears Throat: lips, mucosa, and tongue normal; teeth and gums normal Neck: no adenopathy, no carotid bruit, supple, symmetrical, trachea midline and thyroid not enlarged, symmetric, no tenderness/mass/nodules Back: symmetric, no curvature. ROM normal. No CVA tenderness. Lungs: clear to auscultation bilaterally Heart: regular rate and rhythm, S1, S2 normal, no murmur, click, rub or gallop Abdomen: soft, non-tender; bowel sounds normal; no masses,  no organomegaly Pulses: 2+ and symmetric MSK: Large effusion tracking down into right  forearm.  Has a good radial pulse. Skin: Skin color, texture, turgor normal. No rashes or lesions Lymph nodes: Cervical, supraclavicular, and axillary nodes normal.  Assessment and Plan:  Effusion of right olecranon bursa Secondary to trauma last week, now with swelling of  forearm. He has a good radial pulse but is developing numbness in the ulnar side of hand which may be due to trauma of ulnar nerve versus early compartment syndrome.  Urgent referral to [facility],  Dr Claris Gladden to see him.    Updated Medication List Outpatient Encounter Prescriptions as of 10/23/2013  Medication Sig  . carvedilol (COREG) 6.25 MG tablet TAKE ONE TABLET BY MOUTH TWICE DAILY   . fluticasone (FLONASE) 50 MCG/ACT nasal spray Place 2 sprays into the nose daily.    [name] losartan-hydrochlorothiazide (HYZAAR) 100-25 MG per tablet Take one tablet by mouth one time daily  . naproxen (NAPROSYN) 500 MG tablet Take 500 mg by mouth 2 (two) times  daily as needed.      No orders of the defined types were placed in this encounter.    No Follow-up on file.

## 2013-10-23 NOTE — Assessment & Plan Note (Signed)
Secondary to trauma last week, now with swelling of forearm. He has a good radial pulse but is developing numbness in the ulnar side of hand which may be due to trauma of ulnar nerve versus early compartment syndrome.  Urgent referral to [facility] clinic,  Dr Claris Gladden to see him.

## 2013-10-26 ENCOUNTER — Other Ambulatory Visit: Payer: Self-pay | Admitting: Internal Medicine

## 2013-11-21 ENCOUNTER — Other Ambulatory Visit: Payer: Self-pay | Admitting: Internal Medicine

## 2014-03-04 ENCOUNTER — Other Ambulatory Visit: Payer: Self-pay | Admitting: Internal Medicine

## 2014-03-26 ENCOUNTER — Inpatient Hospital Stay: Payer: Self-pay | Admitting: Internal Medicine

## 2014-03-26 LAB — COMPREHENSIVE METABOLIC PANEL
Albumin: 3.9 g/dL (ref 3.4–5.0)
Alkaline Phosphatase: 65 U/L
Anion Gap: 7 (ref 7–16)
BILIRUBIN TOTAL: 0.7 mg/dL (ref 0.2–1.0)
BUN: 23 mg/dL — ABNORMAL HIGH (ref 7–18)
CALCIUM: 9.3 mg/dL (ref 8.5–10.1)
Chloride: 107 mmol/L (ref 98–107)
Co2: 24 mmol/L (ref 21–32)
Creatinine: 1.16 mg/dL (ref 0.60–1.30)
EGFR (Non-African Amer.): >60
GLUCOSE: 120 mg/dL — AB (ref 65–99)
Osmolality: 281 (ref 275–301)
POTASSIUM: 3.7 mmol/L (ref 3.5–5.1)
SGOT(AST): 20 U/L (ref 15–37)
SGPT (ALT): 32 U/L (ref 12–78)
Sodium: 138 mmol/L (ref 136–145)
Total Protein: 7.8 g/dL (ref 6.4–8.2)

## 2014-03-26 LAB — LIPASE, BLOOD: Lipase: 70 U/L — ABNORMAL LOW (ref 73–393)

## 2014-03-26 LAB — URINALYSIS, COMPLETE
BACTERIA: NONE SEEN
Bilirubin,UR: NEGATIVE
Glucose,UR: NEGATIVE mg/dL (ref 0–75)
Ketone: NEGATIVE
Leukocyte Esterase: NEGATIVE
NITRITE: NEGATIVE
PROTEIN: NEGATIVE
Ph: 5 (ref 4.5–8.0)
RBC,UR: 1 /HPF (ref 0–5)
SPECIFIC GRAVITY: 1.008 (ref 1.003–1.030)
SQUAMOUS EPITHELIAL: NONE SEEN
WBC UR: NONE SEEN /HPF (ref 0–5)

## 2014-03-26 LAB — CBC WITH DIFFERENTIAL/PLATELET
BASOS ABS: 0.1 10*3/uL (ref 0.0–0.1)
BASOS PCT: 0.4 %
EOS ABS: 0.1 10*3/uL (ref 0.0–0.7)
EOS PCT: 0.8 %
HCT: 49.8 % (ref 40.0–52.0)
HGB: 17 g/dL (ref 13.0–18.0)
LYMPHS PCT: 12.9 %
Lymphocyte #: 2.1 10*3/uL (ref 1.0–3.6)
MCH: 28.7 pg (ref 26.0–34.0)
MCHC: 34.1 g/dL (ref 32.0–36.0)
MCV: 84 fL (ref 80–100)
MONOS PCT: 6.5 %
Monocyte #: 1.1 x10 3/mm — ABNORMAL HIGH (ref 0.2–1.0)
NEUTROS PCT: 79.4 %
Neutrophil #: 13 10*3/uL — ABNORMAL HIGH (ref 1.4–6.5)
Platelet: 209 10*3/uL (ref 150–440)
RBC: 5.92 10*6/uL — AB (ref 4.40–5.90)
RDW: 14.4 % (ref 11.5–14.5)
WBC: 16.3 10*3/uL — AB (ref 3.8–10.6)

## 2014-03-26 LAB — TROPONIN I

## 2014-03-27 LAB — BASIC METABOLIC PANEL WITH GFR
Anion Gap: 5 — ABNORMAL LOW
BUN: 20 mg/dL — ABNORMAL HIGH
Calcium, Total: 8.4 mg/dL — ABNORMAL LOW
Chloride: 105 mmol/L
Co2: 30 mmol/L
Creatinine: 1.34 mg/dL — ABNORMAL HIGH
EGFR (African American): >60
EGFR (Non-African Amer.): 58 — ABNORMAL LOW
Glucose: 122 mg/dL — ABNORMAL HIGH
Osmolality: 283
Potassium: 3.7 mmol/L
Sodium: 140 mmol/L

## 2014-03-27 LAB — CBC WITH DIFFERENTIAL/PLATELET
BASOS PCT: 0.4 %
Basophil #: 0 10*3/uL (ref 0.0–0.1)
Eosinophil #: 0.1 10*3/uL (ref 0.0–0.7)
Eosinophil %: 0.7 %
HCT: 40.1 % (ref 40.0–52.0)
HGB: 13.4 g/dL (ref 13.0–18.0)
Lymphocyte #: 3.3 10*3/uL (ref 1.0–3.6)
Lymphocyte %: 31.3 %
MCH: 28.5 pg (ref 26.0–34.0)
MCHC: 33.6 g/dL (ref 32.0–36.0)
MCV: 85 fL (ref 80–100)
MONO ABS: 1 x10 3/mm (ref 0.2–1.0)
Monocyte %: 9.5 %
Neutrophil #: 6 10*3/uL (ref 1.4–6.5)
Neutrophil %: 58.1 %
Platelet: 186 10*3/uL (ref 150–440)
RBC: 4.72 10*6/uL (ref 4.40–5.90)
RDW: 14.4 % (ref 11.5–14.5)
WBC: 10.4 10*3/uL (ref 3.8–10.6)

## 2014-03-28 LAB — CBC WITH DIFFERENTIAL/PLATELET
BASOS PCT: 0.4 %
Basophil #: 0 10*3/uL (ref 0.0–0.1)
Eosinophil #: 0.2 10*3/uL (ref 0.0–0.7)
Eosinophil %: 2.4 %
HCT: 39.7 % — ABNORMAL LOW (ref 40.0–52.0)
HGB: 13.7 g/dL (ref 13.0–18.0)
Lymphocyte #: 2 10*3/uL (ref 1.0–3.6)
Lymphocyte %: 26 %
MCH: 29.2 pg (ref 26.0–34.0)
MCHC: 34.4 g/dL (ref 32.0–36.0)
MCV: 85 fL (ref 80–100)
Monocyte #: 0.7 x10 3/mm (ref 0.2–1.0)
Monocyte %: 8.7 %
Neutrophil #: 4.7 10*3/uL (ref 1.4–6.5)
Neutrophil %: 62.5 %
Platelet: 170 10*3/uL (ref 150–440)
RBC: 4.68 10*6/uL (ref 4.40–5.90)
RDW: 14.4 % (ref 11.5–14.5)
WBC: 7.5 10*3/uL (ref 3.8–10.6)

## 2014-03-28 LAB — BASIC METABOLIC PANEL
Anion Gap: 3 — ABNORMAL LOW (ref 7–16)
BUN: 12 mg/dL (ref 7–18)
CALCIUM: 8.5 mg/dL (ref 8.5–10.1)
CHLORIDE: 110 mmol/L — AB (ref 98–107)
CO2: 30 mmol/L (ref 21–32)
Creatinine: 1.22 mg/dL (ref 0.60–1.30)
EGFR (African American): >60
EGFR (Non-African Amer.): >60
Glucose: 93 mg/dL (ref 65–99)
OSMOLALITY: 284 (ref 275–301)
Potassium: 3.9 mmol/L (ref 3.5–5.1)
Sodium: 143 mmol/L (ref 136–145)

## 2014-03-28 LAB — CLOSTRIDIUM DIFFICILE(ARMC)

## 2014-03-28 LAB — MAGNESIUM: Magnesium: 2 mg/dL

## 2014-03-29 LAB — WBCS, STOOL

## 2014-03-31 LAB — STOOL CULTURE

## 2014-04-10 ENCOUNTER — Encounter: Payer: Self-pay | Admitting: Internal Medicine

## 2014-04-10 ENCOUNTER — Ambulatory Visit (INDEPENDENT_AMBULATORY_CARE_PROVIDER_SITE_OTHER): Payer: BC Managed Care – PPO | Admitting: Internal Medicine

## 2014-04-10 VITALS — BP 120/70 | HR 90 | Temp 98.8°F | Resp 18 | Wt 208.5 lb

## 2014-04-10 DIAGNOSIS — K5732 Diverticulitis of large intestine without perforation or abscess without bleeding: Secondary | ICD-10-CM

## 2014-04-10 DIAGNOSIS — H1013 Acute atopic conjunctivitis, bilateral: Secondary | ICD-10-CM

## 2014-04-10 DIAGNOSIS — H1045 Other chronic allergic conjunctivitis: Secondary | ICD-10-CM

## 2014-04-10 MED ORDER — AZELASTINE HCL 0.05 % OP SOLN: 1.0000 [drp] | Freq: Two times a day (BID) | OPHTHALMIC | Status: DC

## 2014-04-10 NOTE — Progress Notes (Signed)
Patient ID: Brian Atkins, male   DOB: [birth year], 58 y.o.   MRN: [id number]  Patient Active Problem List   Diagnosis Date Noted  . Diverticulitis of colon without hemorrhage 04/13/2014  . Allergic conjunctivitis, bilateral 04/13/2014  . Routine general medical examination at a health care facility 03/04/2013  . Special screening for malignant neoplasm of prostate 12/07/2011  . Screening for colon cancer 12/07/2011  . Microscopic hematuria   . Hemorrhoids   . SLEEP APNEA 10/01/2008  . HYPERLIPIDEMIA 09/26/2008  . HYPERTENSION 09/26/2008  . GERD 09/26/2008    Subjective:  CC:   Chief Complaint  Patient presents with  . Follow-up    hospiatl follow up all meds at 90 day supplies.    HPI:   Brian Atkins is a 58 y.o. male who presents for Hospital  follow up .  Patient was admitted April 7 to April 9 at [facility] for severe lower abdominal pain accompanied by nausea, abdominal distension and leukocytosis.  Was diagnosed with  gastroenteritis by GI consult [name]).  He was treated with Cipro/flagyl for sudden onset of profuse diarrhea while in house,  And the assay for  C dificile toxin was negative .  CT scan suggested ileitis or Crohn's (small bowel wall thickening and sigmoid diverticulitis was noted). He was discharged home on clear liquid diet and abx.    Has been home 13 day. ,  Had diarrhea for the first few days but eventually with clear liquid diet  Stools solidified . Ate red meat yesteday,  No problem.  Told to follow up with his GI but has not made appt with Dr. Henrene Pastor.  No instructions on diet given.  Has sigmoid diverticuliss by report on 2008 colonoscopy   2) Swollen eyes for the past 3 months.  Etiology unclear,  Has allergies to weeds by recent environmental allergy testing (per patient).  [name] did CT scan of head (I assume to rule out orbital cellulitis) .,  Found a deviated septum.  Turbinate hypertrophy. PND keeping him awake.   taking singulair ,  Allegra,  nasonex bid for  the pqst 2 weeks . PND has improved.  Was treated initially with predisone taper, flonase.  Currently  Also using a compounded oral drop prescribed by Dr. Kathyrn Sheriff.  ,     Past Medical History  Diagnosis Date  . Medial epicondylitis of right elbow   . Meralgia paresthetica of right side   . Microscopic hematuria   . Dyslipidemia   . Labile hypertension   . Hemorrhoids     Past Surgical History  Procedure Laterality Date  . Nissen funduplication  4709    [name]       The following portions of the patient's history were reviewed and updated as appropriate: Allergies, current medications, and problem list.    Review of Systems:   Patient denies headache, fevers, malaise, unintentional weight loss, skin rash, eye pain, sinus congestion and sinus pain, sore throat, dysphagia,  hemoptysis , cough, dyspnea, wheezing, chest pain, palpitations, orthopnea, edema, abdominal pain, nausea, melena, diarrhea, constipation, flank pain, dysuria, hematuria, urinary  Frequency, nocturia, numbness, tingling, seizures,  Focal weakness, Loss of consciousness,  Tremor, insomnia, depression, anxiety, and suicidal ideation.     History   Social History  . Marital Status: Married    Spouse Name: N/A    Number of Children: N/A  . Years of Education: 14   Occupational History  . [employer]  Social History Main Topics  . Smoking status: Never Smoker   . Smokeless tobacco: Never Used  . Alcohol Use: No  . Drug Use: No  . Sexual Activity: Yes   Other Topics Concern  . Not on file   Social History Narrative  . No narrative on file    Objective:  Filed Vitals:   04/10/14 1525  BP: 120/70  Pulse: 90  Temp: 98.8 F (37.1 C)  Resp: 18     General appearance: alert, cooperative and appears stated age Ears: normal TM's and external ear canals both ears Throat: lips, mucosa, and tongue normal; teeth and gums normal Neck: no adenopathy, no carotid bruit,  supple, symmetrical, trachea midline and thyroid not enlarged, symmetric, no tenderness/mass/nodules Back: symmetric, no curvature. ROM normal. No CVA tenderness. Lungs: clear to auscultation bilaterally Heart: regular rate and rhythm, S1, S2 normal, no murmur, click, rub or gallop Abdomen: soft, non-tender; bowel sounds normal; no masses,  no organomegaly Pulses: 2+ and symmetric Skin: Skin color, texture, turgor normal. No rashes or lesions Lymph nodes: Cervical, supraclavicular, and axillary nodes normal.  Assessment and Plan:  Diverticulitis of colon without hemorrhage By recent CT and admission. Low residue high fiber diet discussed.  Follow up with Dr, Henrene Pastor.   Allergic conjunctivitis, bilateral Persistent symptoms despite treatment by Dr Kathyrn Sheriff with multiple modalities.  Adding Dymista. Records requested.   A total of 40 minutes was spent with patient more than half of which was spent in counseling, reviewing records from other prviders and coordination of care.  Updated Medication List Outpatient Encounter Prescriptions as of 04/10/2014  Medication Sig  . carvedilol (COREG) 6.25 MG tablet TAKE ONE TABLET BY MOUTH TWICE DAILY   . carvedilol (COREG) 6.25 MG tablet TAKE ONE TABLET BY MOUTH TWICE DAILY   . fexofenadine (ALLEGRA) 180 MG tablet Take 180 mg by mouth daily.  [name] losartan-hydrochlorothiazide (HYZAAR) 100-25 MG per tablet TAKE ONE TABLET BY MOUTH ONE TIME DAILY   . mometasone (NASONEX) 50 MCG/ACT nasal spray Place 2 sprays into the nose daily.  . montelukast (SINGULAIR) 10 MG tablet Take 1 tablet by mouth daily at 8 pm.  . naproxen (NAPROSYN) 500 MG tablet Take 500 mg by mouth 2 (two) times daily as needed.   [name] azelastine (OPTIVAR) 0.05 % ophthalmic solution Place 1 drop into both eyes 2 (two) times daily.  . fluticasone (FLONASE) 50 MCG/ACT nasal spray Place 2 sprays into the nose daily.       Orders Placed This Encounter  Procedures  . Ambulatory referral to  Gastroenterology    No Follow-up on file.

## 2014-04-10 NOTE — Progress Notes (Signed)
Pre-visit discussion using our clinic review tool. No additional management support is needed unless otherwise documented below in the visit note.  

## 2014-04-10 NOTE — Patient Instructions (Addendum)
Try Toufayan  Low carb flat bread  Sandwich thins,  Joseph's sandwhich thins   We will set you up with Dr Henrene Pastor  Trial of azelastine eye drops for allergic conjunctivitis    Diverticulosis Diverticulosis is a common condition that develops when small pouches (diverticula) form in the wall of the colon. The risk of diverticulosis increases with age. It happens more often in people who eat a low-fiber diet. Most individuals with diverticulosis have no symptoms. Those individuals with symptoms usually experience abdominal pain, constipation, or loose stools (diarrhea). HOME CARE INSTRUCTIONS   Increase the amount of fiber in your diet as directed by your caregiver or dietician. This may reduce symptoms of diverticulosis.  Your caregiver may recommend taking a dietary fiber supplement.  Drink at least 6 to 8 glasses of water each day to prevent constipation.  Try not to strain when you have a bowel movement.  Your caregiver may recommend avoiding nuts and seeds to prevent complications, although this is still an uncertain benefit.  Only take over-the-counter or prescription medicines for pain, discomfort, or fever as directed by your caregiver. FOODS WITH HIGH FIBER CONTENT INCLUDE:  Fruits. Apple, peach, pear, tangerine, raisins, prunes.  Vegetables. Brussels sprouts, asparagus, broccoli, cabbage, carrot, cauliflower, romaine lettuce, spinach, summer squash, tomato, winter squash, zucchini.  Starchy Vegetables. Baked beans, kidney beans, lima beans, split peas, lentils, potatoes (with skin).  Grains. Whole wheat bread, brown rice, bran flake cereal, plain oatmeal, white rice, shredded wheat, bran muffins. SEEK IMMEDIATE MEDICAL CARE IF:   You develop increasing pain or severe bloating.  You have an oral temperature above 102 F (38.9 C), not controlled by medicine.  You develop vomiting or bowel movements that are bloody or black. Document Released: 09/02/2004 Document Revised:  02/28/2012 Document Reviewed: 05/06/2010 [facility] Patient Information 2014 [place].

## 2014-04-13 ENCOUNTER — Encounter: Payer: Self-pay | Admitting: Internal Medicine

## 2014-04-13 DIAGNOSIS — K5732 Diverticulitis of large intestine without perforation or abscess without bleeding: Secondary | ICD-10-CM | POA: Insufficient documentation

## 2014-04-13 DIAGNOSIS — H1013 Acute atopic conjunctivitis, bilateral: Secondary | ICD-10-CM | POA: Insufficient documentation

## 2014-04-13 NOTE — Assessment & Plan Note (Signed)
Persistent symptoms despite treatment by Dr Kathyrn Sheriff with multiple modalities.  Adding Dymista. Records requested.

## 2014-04-13 NOTE — Assessment & Plan Note (Signed)
By recent CT and admission. Low residue high fiber diet discussed.  Follow up with Dr, Henrene Pastor.

## 2014-04-24 ENCOUNTER — Other Ambulatory Visit: Payer: Self-pay | Admitting: Internal Medicine

## 2014-04-29 ENCOUNTER — Telehealth: Payer: Self-pay | Admitting: Internal Medicine

## 2014-04-29 NOTE — Telephone Encounter (Signed)
Pt states he is having another diverticulitis flare and would like to be seen sooner than June. Pt scheduled to see Dr. Henrene Pastor tomorrow morning, he is aware of appt date and time.

## 2014-04-30 ENCOUNTER — Other Ambulatory Visit (INDEPENDENT_AMBULATORY_CARE_PROVIDER_SITE_OTHER): Payer: BC Managed Care – PPO

## 2014-04-30 ENCOUNTER — Encounter: Payer: Self-pay | Admitting: Internal Medicine

## 2014-04-30 ENCOUNTER — Ambulatory Visit (INDEPENDENT_AMBULATORY_CARE_PROVIDER_SITE_OTHER): Payer: BC Managed Care – PPO | Admitting: Internal Medicine

## 2014-04-30 VITALS — BP 120/80 | HR 68 | Ht 73.0 in | Wt 205.4 lb

## 2014-04-30 DIAGNOSIS — K5732 Diverticulitis of large intestine without perforation or abscess without bleeding: Secondary | ICD-10-CM

## 2014-04-30 DIAGNOSIS — R933 Abnormal findings on diagnostic imaging of other parts of digestive tract: Secondary | ICD-10-CM

## 2014-04-30 DIAGNOSIS — R109 Unspecified abdominal pain: Secondary | ICD-10-CM

## 2014-04-30 DIAGNOSIS — K5792 Diverticulitis of intestine, part unspecified, without perforation or abscess without bleeding: Secondary | ICD-10-CM

## 2014-04-30 DIAGNOSIS — K219 Gastro-esophageal reflux disease without esophagitis: Secondary | ICD-10-CM

## 2014-04-30 LAB — CBC WITH DIFFERENTIAL/PLATELET
BASOS PCT: 0.5 % (ref 0.0–3.0)
Basophils Absolute: 0 10*3/uL (ref 0.0–0.1)
Eosinophils Absolute: 0.2 10*3/uL (ref 0.0–0.7)
Eosinophils Relative: 2.8 % (ref 0.0–5.0)
HEMATOCRIT: 42.4 % (ref 39.0–52.0)
Hemoglobin: 14.6 g/dL (ref 13.0–17.0)
LYMPHS ABS: 2.4 10*3/uL (ref 0.7–4.0)
Lymphocytes Relative: 32.5 % (ref 12.0–46.0)
MCHC: 34.5 g/dL (ref 30.0–36.0)
MCV: 85.4 fl (ref 78.0–100.0)
MONO ABS: 0.7 10*3/uL (ref 0.1–1.0)
Monocytes Relative: 9.2 % (ref 3.0–12.0)
Neutro Abs: 4.1 10*3/uL (ref 1.4–7.7)
Neutrophils Relative %: 55 % (ref 43.0–77.0)
PLATELETS: 252 10*3/uL (ref 150.0–400.0)
RBC: 4.96 Mil/uL (ref 4.22–5.81)
RDW: 13.7 % (ref 11.5–15.5)
WBC: 7.4 10*3/uL (ref 4.0–10.5)

## 2014-04-30 LAB — BASIC METABOLIC PANEL
BUN: 19 mg/dL (ref 6–23)
CHLORIDE: 105 meq/L (ref 96–112)
CO2: 31 mEq/L (ref 19–32)
Calcium: 9.5 mg/dL (ref 8.4–10.5)
Creatinine, Ser: 1 mg/dL (ref 0.4–1.5)
GFR: 78.05 mL/min (ref >60.00)
Glucose, Bld: 85 mg/dL (ref 70–99)
POTASSIUM: 4.4 meq/L (ref 3.5–5.1)
SODIUM: 141 meq/L (ref 135–145)

## 2014-04-30 MED ORDER — HYOSCYAMINE SULFATE 0.125 MG SL SUBL: SUBLINGUAL_TABLET | SUBLINGUAL | Status: DC

## 2014-04-30 NOTE — Patient Instructions (Addendum)
Your physician has requested that you go to the basement for the following lab work before leaving today:  BMET, CBC  We have sent the following medications to your pharmacy for you to pick up at your convenience:  Levsin   You have been scheduled for a CT scan of the abdomen and pelvis at [facility] ([address]---this is in the same building as Press [facility]).   You are scheduled on 05/03/2014 at 9:00am. You should arrive 15 minutes prior to your appointment time for registration. Please follow the written instructions below on the day of your exam:  WARNING: IF YOU ARE ALLERGIC TO IODINE/X-RAY DYE, PLEASE NOTIFY RADIOLOGY IMMEDIATELY AT [phone]! YOU WILL BE GIVEN A 13 HOUR PREMEDICATION PREP.  1) Do not eat or drink anything after 5:00am (4 hours prior to your test) 2) You have been given 2 bottles of oral contrast to drink. The solution may taste better if refrigerated, but do NOT add ice or any other liquid to this solution. Shake well before drinking.    Drink 1 bottle of contrast @ 7:00am (2 hours prior to your exam)  Drink 1 bottle of contrast @ 8:00am (1 hour prior to your exam)  You may take any medications as prescribed with a small amount of water except for the following: Metformin, Glucophage, Glucovance, Avandamet, Riomet, Fortamet, Actoplus Met, Janumet, Glumetza or Metaglip. The above medications must be held the day of the exam AND 48 hours after the exam.  The purpose of you drinking the oral contrast is to aid in the visualization of your intestinal tract. The contrast solution may cause some diarrhea. Before your exam is started, you will be given a small amount of fluid to drink. Depending on your individual set of symptoms, you may also receive an intravenous injection of x-ray contrast/dye. Plan on being at [facility] for 30 minutes or long, depending on the type of exam you are having performed.  If you have any questions regarding your  exam or if you need to reschedule, you may call the CT department at [phone] between the hours of 8:00 am and 5:00 pm, Monday-Friday.  ________________________________________________________________________

## 2014-04-30 NOTE — Progress Notes (Signed)
HISTORY OF PRESENT ILLNESS:  Brian Atkins is a 58 y.o. male who is seen here remotely for refractory GERD for which she eventually underwent surgical fundoplication with good results. He presents today regarding recent problems with abdominal pain and possible diverticulitis. He is accompanied by his wife. He reports intermittent abdominal discomfort over the past several months. In early April, was hospitalized at [facility] for abdominal pain. Diagnosed with acute gastroenteritis. Was treated with antibiotics. Seen in followup by his PCP. Told he had diverticulitis. This appointment made. Several days ago was complaining of recurrent cramping type abdominal discomfort. Slightly loose bowels. No fevers or other issues. Colonoscopy and upper endoscopy here 10/09/2008. Colonoscopy revealed scattered diverticulosis and hemorrhoids. Upper endoscopy revealed a hiatal hernia  REVIEW OF SYSTEMS:  All non-GI ROS negative except for sinus and allergy, fatigue, headaches, muscle cramps, sleeping problems  Past Medical History  Diagnosis Date  . Medial epicondylitis of right elbow   . Meralgia paresthetica of right side   . Microscopic hematuria   . Dyslipidemia   . Labile hypertension   . Hemorrhoids   . Diverticulitis   . GERD (gastroesophageal reflux disease)   . Hiatal hernia   . Diverticulosis     Past Surgical History  Procedure Laterality Date  . Nissen funduplication  5621    Brian Atkins    Social History Brian Atkins  reports that he has never smoked. He has never used smokeless tobacco. He reports that he does not drink alcohol or use illicit drugs.  family history includes Aortic aneurysm (age of onset: 70) in his father; COPD in his mother; Cancer in his mother; Coronary artery disease (age of onset: 31) in his father; Hyperlipidemia in his father; Hypertension in his mother; Lung cancer in his mother; Stomach cancer in his maternal grandfather.  Allergies  Allergen Reactions   . Avelox [Moxifloxacin]   . Crestor [Rosuvastatin Calcium]   . Morphine And Related   . Niacin-Simvastatin Er        PHYSICAL EXAMINATION: Vital signs: BP 120/80  Pulse 68  Ht 6\' 1"  (1.854 m)  Wt 205 lb 6.4 oz (93.169 kg)  BMI 27.11 kg/m2 General: Well-developed, well-nourished, no acute distress HEENT: Sclerae are anicteric, conjunctiva pink. Oral mucosa intact Lungs: Clear Heart: Regular Abdomen: soft, nontender, nondistended, no obvious ascites, no peritoneal signs, normal bowel sounds. No organomegaly. Extremities: No edema Psychiatric: alert and oriented x3. Cooperative   ASSESSMENT:  #1. Recent abdominal problems gastritis versus diverticulitis. Recently with some recurrent pain. Nontoxic today #2. History of GERD status post fundoplication   PLAN:  #1. Contrast-enhanced CT scan of the abdomen and pelvis #2. CBC #3. Levsin sublingual prescribed when necessary pain #4. Followup plans to be determined #5. Routine colonoscopy would be due around 2019

## 2014-05-03 ENCOUNTER — Ambulatory Visit (INDEPENDENT_AMBULATORY_CARE_PROVIDER_SITE_OTHER)
Admission: RE | Admit: 2014-05-03 | Discharge: 2014-05-03 | Disposition: A | Discharge status: Home / Self Care | Payer: BC Managed Care – PPO | Source: Ambulatory Visit | Attending: Internal Medicine | Admitting: Internal Medicine

## 2014-05-03 DIAGNOSIS — K5732 Diverticulitis of large intestine without perforation or abscess without bleeding: Secondary | ICD-10-CM

## 2014-05-03 DIAGNOSIS — K5792 Diverticulitis of intestine, part unspecified, without perforation or abscess without bleeding: Secondary | ICD-10-CM

## 2014-05-03 MED ORDER — IOHEXOL 300 MG/ML  SOLN
100.0000 mL | Freq: Once | INTRAMUSCULAR | Status: AC | PRN
  Administered 2014-05-03: 100 mL via INTRAVENOUS

## 2014-05-15 ENCOUNTER — Ambulatory Visit: Payer: Self-pay | Admitting: Ophthalmology

## 2014-05-16 ENCOUNTER — Ambulatory Visit (INDEPENDENT_AMBULATORY_CARE_PROVIDER_SITE_OTHER)
Admission: RE | Admit: 2014-05-16 | Discharge: 2014-05-16 | Disposition: A | Discharge status: Home / Self Care | Payer: BC Managed Care – PPO | Source: Ambulatory Visit | Attending: Internal Medicine | Admitting: Internal Medicine

## 2014-05-16 ENCOUNTER — Encounter: Payer: Self-pay | Admitting: Internal Medicine

## 2014-05-16 ENCOUNTER — Ambulatory Visit (INDEPENDENT_AMBULATORY_CARE_PROVIDER_SITE_OTHER): Payer: BC Managed Care – PPO | Admitting: Internal Medicine

## 2014-05-16 VITALS — BP 146/90 | HR 54 | Temp 99.0°F | Resp 18 | Ht 75.0 in | Wt 204.2 lb

## 2014-05-16 DIAGNOSIS — R599 Enlarged lymph nodes, unspecified: Secondary | ICD-10-CM

## 2014-05-16 DIAGNOSIS — R591 Generalized enlarged lymph nodes: Secondary | ICD-10-CM

## 2014-05-16 DIAGNOSIS — R109 Unspecified abdominal pain: Secondary | ICD-10-CM

## 2014-05-16 DIAGNOSIS — H5789 Other specified disorders of eye and adnexa: Secondary | ICD-10-CM

## 2014-05-16 LAB — COMPREHENSIVE METABOLIC PANEL
ALK PHOS: 53 U/L (ref 39–117)
ALT: 36 U/L (ref 0–53)
AST: 25 U/L (ref 0–37)
Albumin: 4.3 g/dL (ref 3.5–5.2)
BILIRUBIN TOTAL: 0.6 mg/dL (ref 0.2–1.2)
BUN: 18 mg/dL (ref 6–23)
CO2: 28 mEq/L (ref 19–32)
CREATININE: 1.3 mg/dL (ref 0.4–1.5)
Calcium: 9.3 mg/dL (ref 8.4–10.5)
Chloride: 107 mEq/L (ref 96–112)
GFR: 60.86 mL/min (ref >60.00)
Glucose, Bld: 90 mg/dL (ref 70–99)
Potassium: 3.8 mEq/L (ref 3.5–5.1)
Sodium: 142 mEq/L (ref 135–145)
Total Protein: 7.5 g/dL (ref 6.0–8.3)

## 2014-05-16 LAB — CBC WITH DIFFERENTIAL/PLATELET
BASOS PCT: 0.4 % (ref 0.0–3.0)
Basophils Absolute: 0 10*3/uL (ref 0.0–0.1)
EOS ABS: 0.2 10*3/uL (ref 0.0–0.7)
EOS PCT: 2.4 % (ref 0.0–5.0)
HCT: 44.4 % (ref 39.0–52.0)
HEMOGLOBIN: 15.3 g/dL (ref 13.0–17.0)
Lymphocytes Relative: 28.7 % (ref 12.0–46.0)
Lymphs Abs: 2.5 10*3/uL (ref 0.7–4.0)
MCHC: 34.4 g/dL (ref 30.0–36.0)
MCV: 85.3 fl (ref 78.0–100.0)
MONO ABS: 0.7 10*3/uL (ref 0.1–1.0)
Monocytes Relative: 7.8 % (ref 3.0–12.0)
NEUTROS ABS: 5.2 10*3/uL (ref 1.4–7.7)
Neutrophils Relative %: 60.7 % (ref 43.0–77.0)
Platelets: 276 10*3/uL (ref 150.0–400.0)
RBC: 5.21 Mil/uL (ref 4.22–5.81)
RDW: 13.6 % (ref 11.5–15.5)
WBC: 8.5 10*3/uL (ref 4.0–10.5)

## 2014-05-16 MED ORDER — CARVEDILOL 6.25 MG PO TABS: ORAL_TABLET | ORAL | Status: DC

## 2014-05-16 MED ORDER — DICYCLOMINE HCL 20 MG PO TABS: 20.0000 mg | ORAL_TABLET | Freq: Three times a day (TID) | ORAL | Status: DC

## 2014-05-16 MED ORDER — LOSARTAN POTASSIUM-HCTZ 100-25 MG PO TABS: ORAL_TABLET | ORAL | Status: DC

## 2014-05-16 NOTE — Patient Instructions (Addendum)
Your CT scans suggest an infiltrative process affecting eyes,  Spleen, liver, esophagus and stomach This may be due to sarcoid or due to lymphoma  Chest x ray today at [facility] to evaluate the lungs and lymph nodes in the chest   Trial of dicyclomine,  An antispasmodic that treats irritable bowel syndrome (which may be causing your right lower quadrant pain )  Take it 15 minutes prior to every meal (up to 4 daily)   We will need to ultimately get a biopsy , but labs today may help make the diagosis   Sarcoidosis, Schaumann's Disease, Sarcoid of [place] Sarcoidosis appears briefly and heals naturally in 16 to 93 percent of cases, often without the patient knowing or doing anything about it. 20 to 30 percent of patients with sarcoidosis are left with some permanent lung damage. In 10 to 15 percent of the patients, sarcoidosis can become chronic (long lasting). When either the granulomas or fibrosis seriously affect the function of a vital organ (lungs, heart, nervous system, liver, or kidneys), sarcoidosis can be fatal. This occurs 5 to 10 percent of the time. No one can predict how sarcoidosis will progress in an individual patient. The symptoms the patient experiences, the caregiver's findings, and the patient's race can give some clues. Sarcoidosis was once considered a rare disease. We now know that it is a common chronic illness that appears all over the world. It is the most common of the fibrotic (scarring) lung disorders. Anyone can get sarcoidosis. It occurs in all races and in both sexes. The risk is greater if you are a young black adult, especially a black woman, or are of [place], [place], [place], or [place] origin. In sarcoidosis, small lumps (also called nodules or granulomas) develop in multiple organs of the body. These granulomas are small collections of inflamed cells. They commonly appear in the lungs. This is the most common organ affected. They also occur in the lymph  nodes (your glands), skin, liver, and eyes. The granulomas vary in the amount of disease they produce from very little with no problems (symptoms) to causing severe illness. The cause of sarcoidosis is not known. It may be due to an abnormal immune reaction in the body. Most people will recover. A few people will develop long lasting conditions that may get worse. Women are affected more often than men. The majority of those affected are under 19 years of age. Because we do not know the cause, we do not have ways to prevent it. SYMPTOMS   Fever.  Loss of appetite.  Night sweats.  Joint pain.  Aching muscles Symptoms vary because the disease affects different parts of the body in different people. Most people who see their caregiver with sarcoidosis have lung problems. The first signs are usually a dry cough and shortness of breath. There may also be wheezing, chest pain, or a cough that brings up bloody mucus. In severe cases, lung function may become so poor that the person cannot perform even the simple routine tasks of daily life. Other symptoms of sarcoidosis are less common than lung symptoms. They can include:  Skin symptoms. Sarcoidosis can appear as a collection of tender, red bumps called erythema nodosum. These bumps usually occur on the face, shins, and arms. They can also occur as a scaly, purplish discoloration on the nose, cheeks, and ears. This is called lupus pernio. Less often, sarcoidosis causes cysts, pimples, or disfiguring over growths of skin. In many cases, the disfiguring over  growths develop in areas of scars or tattoos.  Eye symptoms. These include redness, eye pain, and sensitivity to light.  Heart symptoms. These include irregular heartbeat and heart failure.  Other symptoms. A person may have paralyzed facial muscles, seizures, psychiatric symptoms, swollen salivary glands, or bone pain. DIAGNOSIS  Even when there are no symptoms, your caregiver can sometimes  pick up signs of sarcoidosis during a routine examination, usually through a chest x-ray or when checking other complaints. The patient's age and race or ethnic group can raise an additional red flag that a sign or symptom could be related to sarcoidosis.   Enlargement of the salivary or tear glands and cysts in bone tissue may also be caused by sarcoidosis.  You may have had a biopsy done that shows signs of sarcoidosis. A biopsy is a small tissue sample that is removed for laboratory testing. This tissue sample can be taken from your lung, skin, lip, or another inflamed or abnormal area of the body.  You may have had an abnormal chest X-ray. Although you appear healthy, a chest X-ray ordered for other reasons may turn up abnormalities that suggest sarcoidosis.  Other tests may be needed. These tests may be done to rule out other illnesses or to determine the amount of organ damage caused by sarcoidosis. Some of the most common tests are:  Blood levels of calcium or angiotensin-converting enzyme may be high in people with sarcoidosis.  Blood tests to evaluate how well your liver is functioning.  Lung function tests to measure how well you are breathing.  A complete eye examination. TREATMENT  If sarcoidosis does not cause any problems, treatment may not be necessary. Your caregiver may decide to simply monitor your condition. As part of this monitoring process, you may have frequent office visits, follow-up chest X-rays, and tests of your lung function.If you have signs of moderate or severe lung disease, your doctor may recommend:  A corticosteroid drug, such as prednisone (sold under several brand names).  Corticosteroids also are used to treat sarcoidosis of the eyes, joints, skin, nerves, or heart.  Corticosteroid eye drops may be used for the eyes.  Over-the-counter medications like nonsteroidal anti-inflammatory drugs (NSAID) often are used to treat joint pain first before  corticosteroids, which tend to have more side effects.  If corticosteroids are not effective or cause serious side effects, other drugs that alter or suppress the immune system may be used.  In rare cases, when sarcoidosis causes life-threatening lung disease, a lung transplant may be necessary. However, there is some risk that the new lungs also will be attacked by sarcoidosis. SEEK IMMEDIATE MEDICAL CARE IF:   You suffer from shortness of breath or a lingering cough.  You develop new problems that may be related to the disease. Remember this disease can affect almost all organs of the body and cause many different problems. Document Released: 10/06/2004 Document Revised: 02/28/2012 Document Reviewed: 03/16/2006 [facility] Patient Information 2014 [place].

## 2014-05-16 NOTE — Progress Notes (Signed)
Patient ID: Brian Atkins, male   DOB: [birth year], 58 y.o.   MRN: [id number]   Patient Active Problem List   Diagnosis Date Noted  . Abdominal pain, unspecified site 05/17/2014  . Periorbital swelling 05/17/2014  . Diverticulitis of colon without hemorrhage 04/13/2014  . Routine general medical examination at a health care facility 03/04/2013  . Special screening for malignant neoplasm of prostate 12/07/2011  . Screening for colon cancer 12/07/2011  . Microscopic hematuria   . Hemorrhoids   . SLEEP APNEA 10/01/2008  . HYPERLIPIDEMIA 09/26/2008  . HYPERTENSION 09/26/2008  . GERD 09/26/2008    Subjective:  CC:   Chief Complaint  Patient presents with  . Annual Exam    HPI:   Brian Atkins is a 58 y.o. male who presents for Follow up on recurrent abdominal pain and persistent periorbital swelling.  He was referred to GI for recent admission for pain presumed to be secondary to diverticulitis .  Had 2 more episodes after seeing Dr Henrene Pastor of lower abdominal pain and right lower quadrant pain which occurred several hours after eating .  He has also been  having episodes of lower esophageal spasms occurring while  eating which involve the upper epigastric  And resolve after a minute or so.  There was  no change with hysocyamine.  He had a contrasted CT of abd and pelvis and was told by Dr. Henrene Pastor that it was normal. However I have reviewed the CT and there are multiple hypodense lesions in the spleen and liver,  And diffuse circumferential thickening of the lower esophagus and stomach.   He had a CT of the eyes/sinuses recently, ordered by [name], his opthalmologist  after being referred to him by his ENT [name] for persistent periorbital swelling unresponsive to treatment for allergic rhinitis/conjunctivities.  The CT scan was concerning for  Soft tissue hypertrophy/infiltrative changes which raised the issue of either sarcoid or lymphoma.    Had a rash and chest pain after  receiving IV contrasted study at [facility].  Had a contrasted head CT at [facility] and was pretreated with benadryl and prednisone .  Patient is losing weight unintentionally.  Denies night sweats shortness of breath.   Previous history (april 2014)  Hospital  follow up .  Patient was admitted April 7 to April 9 at [facility] for severe lower abdominal pain accompanied by nausea, abdominal distension and leukocytosis.  Was diagnosed with  gastroenteritis by GI consult [name]).  He was treated with Cipro/flagyl for sudden onset of profuse diarrhea while in house,  And the assay for  C dificile toxin was negative .  CT scan suggested ileitis or Crohn's (small bowel wall thickening and sigmoid diverticulitis was noted). He was discharged home on clear liquid diet and abx.    Has been home 13 days. ,  Had diarrhea for the first few days but eventually with clear liquid diet  Stools solidified . Ate red meat yesteeday,  No problem.  Told to follow up with his GI but has not made appt with Dr. Henrene Pastor.  No instructions on diet given.  Has sigmoid diverticuliss by report on 2008 colonoscopy   2) Swollen eyes for the past 3 months.  Etiology unclear,  Has allergies to weeds by recent environmental allergy testing (per patient).  [name] did CT scan of head (I assume to rule out orbital cellulitis) .,  Found a deviated septum.  Turbinate hypertrophy. PND keeping him awake.   taking singulair ,  Allegra,  nasonex bid for the pqst 2 weeks . PND has improved.  Was treated initially with predisone taper, flonase.  Currently  Also using a compounded oral drop prescribed by Dr. Kathyrn Sheriff.  ,     Past Medical History  Diagnosis Date  . Medial epicondylitis of right elbow   . Meralgia paresthetica of right side   . Microscopic hematuria   . Dyslipidemia   . Labile hypertension   . Hemorrhoids   . Diverticulitis   . GERD (gastroesophageal reflux disease)   . Hiatal hernia   . Diverticulosis     Past Surgical History   Procedure Laterality Date  . Nissen funduplication  0263    [name]       The following portions of the patient's history were reviewed and updated as appropriate: Allergies, current medications, and problem list.    Review of Systems:   Patient denies headache, fevers, malaise, unintentional weight loss, skin rash, eye pain, sinus congestion and sinus pain, sore throat, dysphagia,  hemoptysis , cough, dyspnea, wheezing, chest pain, palpitations, orthopnea, edema, abdominal pain, nausea, melena, diarrhea, constipation, flank pain, dysuria, hematuria, urinary  Frequency, nocturia, numbness, tingling, seizures,  Focal weakness, Loss of consciousness,  Tremor, insomnia, depression, anxiety, and suicidal ideation.     History   Social History  . Marital Status: Married    Spouse Name: N/A    Number of Children: 2  . Years of Education: 14   Occupational History  . [employer]   Social History Main Topics  . Smoking status: Never Smoker   . Smokeless tobacco: Never Used  . Alcohol Use: No  . Drug Use: No  . Sexual Activity: Yes   Other Topics Concern  . Not on file   Social History Narrative  . No narrative on file    Objective:  Filed Vitals:   05/16/14 0826  BP: 146/90  Pulse: 54  Temp: 99 F (37.2 C)  Resp: 18     General appearance: alert, cooperative and appears stated age Ears: normal TM's and external ear canals both ears Throat: lips, mucosa, and tongue normal; teeth and gums normal Neck: no adenopathy, no carotid bruit, supple, symmetrical, trachea midline and thyroid not enlarged, symmetric, no tenderness/mass/nodules Back: symmetric, no curvature. ROM normal. No CVA tenderness. Lungs: clear to auscultation bilaterally Heart: regular rate and rhythm, S1, S2 normal, no murmur, click, rub or gallop Abdomen: soft, non-tender; bowel sounds normal; no masses,  no organomegaly Pulses: 2+ and symmetric Skin: Skin color, texture,  turgor normal. No rashes or lesions Lymph nodes: Cervical, supraclavicular, and axillary nodes normal.  Assessment and Plan:  Abdominal pain, unspecified site His recurrent episodes of abdominal pain occurring in the upper epigaststric area and RLQ may be multiple processes.  Will treat for IBS with bentyl qid.  Need to rule out sarcoid and lymphoma given the changes noted on abd and pelvic CT  Serum ACE was normal . Referring to [place] [facility] for orbital biopsy   Periorbital swelling With soft tissue changes on CT concerign for sarcoid vs lymphoma.  Serum ACE was normal .  Needs biopsy.  Referral to [facility].   A total of 40 minutes was spent with patient more than half of which was spent in counseling, reviewing records from other prviders and coordination of care.   Updated Medication List Outpatient Encounter Prescriptions as of 05/16/2014  Medication Sig  . azelastine (OPTIVAR) 0.05 % ophthalmic solution Place 1 drop  into both eyes 2 (two) times daily.  . carvedilol (COREG) 6.25 MG tablet TAKE ONE TABLET BY MOUTH TWICE DAILY  . fexofenadine (ALLEGRA) 180 MG tablet Take 180 mg by mouth daily.  . hyoscyamine (LEVSIN SL) 0.125 MG SL tablet Take 1-2 tablets sublingually every 4 hours as needed for pain  . losartan-hydrochlorothiazide (HYZAAR) 100-25 MG per tablet TAKE ONE TABLET BY MOUTH ONE TIME DAILY  . mometasone (NASONEX) 50 MCG/ACT nasal spray Place 2 sprays into the nose daily.  . montelukast (SINGULAIR) 10 MG tablet Take 1 tablet by mouth daily at 8 pm.  . naproxen (NAPROSYN) 500 MG tablet Take 500 mg by mouth 2 (two) times daily as needed.   . prednisoLONE acetate (PRED FORTE) 1 % ophthalmic suspension   . [DISCONTINUED] carvedilol (COREG) 6.25 MG tablet TAKE ONE TABLET BY MOUTH TWICE DAILY   . [DISCONTINUED] losartan-hydrochlorothiazide (HYZAAR) 100-25 MG per tablet TAKE ONE TABLET BY MOUTH ONE TIME DAILY   . dicyclomine (BENTYL) 20 MG tablet Take 1 tablet (20 mg total) by mouth 4  (four) times daily -  before meals and at bedtime.  . fluticasone (FLONASE) 50 MCG/ACT nasal spray Place 2 sprays into the nose daily.    . hyoscyamine (LEVSIN SL) 0.125 MG SL tablet Place 1 tablet (0.125 mg total) under the tongue every 4 (four) hours as needed for cramping.     Orders Placed This Encounter  Procedures  . DG Chest 2 View  . Angiotensin converting enzyme  . ANA w/Reflex if Positive  . Comp Met (CMET)  . CBC with Differential    No Follow-up on file.

## 2014-05-16 NOTE — Progress Notes (Signed)
Pre-visit discussion using our clinic review tool. No additional management support is needed unless otherwise documented below in the visit note.  

## 2014-05-17 ENCOUNTER — Telehealth: Payer: Self-pay | Admitting: *Deleted

## 2014-05-17 DIAGNOSIS — C8581 Other specified types of non-Hodgkin lymphoma, lymph nodes of head, face, and neck: Secondary | ICD-10-CM | POA: Insufficient documentation

## 2014-05-17 DIAGNOSIS — H05229 Edema of unspecified orbit: Secondary | ICD-10-CM

## 2014-05-17 DIAGNOSIS — R109 Unspecified abdominal pain: Secondary | ICD-10-CM | POA: Insufficient documentation

## 2014-05-17 LAB — ANGIOTENSIN CONVERTING ENZYME: ANGIOTENSIN-CONVERTING ENZYME: 15 U/L (ref 8–52)

## 2014-05-17 LAB — ANA W/REFLEX IF POSITIVE: Anti Nuclear Antibody(ANA): NEGATIVE

## 2014-05-17 NOTE — Telephone Encounter (Signed)
The ACE test was negative.  As I told him, a negative test does not rule out sarcoid. He needs a biopys,  So i have placed the referral to [facility]  He needs to take all images from [facility] on a CD with inm to appt when schedculed.

## 2014-05-17 NOTE — Telephone Encounter (Signed)
Patient notified and voiced understanding.

## 2014-05-17 NOTE — Assessment & Plan Note (Signed)
His recurrent episodes of abdominal pain occurring in the upper epigaststric area and RLQ may be multiple processes.  Will treat for IBS with bentyl qid.  Need to rule out sarcoid and lymphoma given the changes noted on abd and pelvic CT  Serum ACE was normal . Referring to [place] [facility] for orbital biopsy

## 2014-05-17 NOTE — Telephone Encounter (Signed)
I would not base a diagnosis on the blood tests, so I have placed the referral.

## 2014-05-17 NOTE — Telephone Encounter (Signed)
Dr. Unice Cobble called and stated that he would recommend waiting for the blood work  But if you felt should go ahead with biopsy patient should be referred to [facility] for Ocular biopsy and ask if our office could arrange.?

## 2014-05-17 NOTE — Telephone Encounter (Signed)
Patient called for lab results.

## 2014-05-17 NOTE — Assessment & Plan Note (Signed)
With soft tissue changes on CT concerign for sarcoid vs lymphoma.  Serum ACE was normal .  Needs biopsy.  Referral to [facility].

## 2014-05-31 ENCOUNTER — Telehealth: Payer: Self-pay | Admitting: Internal Medicine

## 2014-05-31 DIAGNOSIS — H0589 Other disorders of orbit: Secondary | ICD-10-CM | POA: Insufficient documentation

## 2014-05-31 NOTE — Telephone Encounter (Signed)
Question of inflammatory bowel disease raised on the most recent CT scan, in May, from Brian Atkins. Next step would be colonoscopy in the [place]. Please arrange

## 2014-05-31 NOTE — Telephone Encounter (Signed)
Pt had CT done at the end of May and he is calling to find out what the next step is. Pt had scan done at [facility] and the result is scanned into epic. Please advise.

## 2014-06-03 NOTE — Telephone Encounter (Signed)
Left message for pt to call back  °

## 2014-06-04 ENCOUNTER — Ambulatory Visit: Payer: BC Managed Care – PPO | Admitting: Internal Medicine

## 2014-06-05 NOTE — Telephone Encounter (Signed)
Left message for pt to call back  °

## 2014-06-07 ENCOUNTER — Telehealth: Payer: Self-pay | Admitting: Internal Medicine

## 2014-06-07 DIAGNOSIS — C884 Extranodal marginal zone b-cell lymphoma of mucosa-associated lymphoid tissue (malt-lymphoma) not having achieved remission: Secondary | ICD-10-CM | POA: Insufficient documentation

## 2014-06-07 NOTE — Telephone Encounter (Signed)
Pt's wife called and was extremely upset. She stated she has not received a returned call since 05-31-14.  Pt went to [facility] and was Dx with cancer.  "I am not happy with not receiving a return call nor with Dr. Henrene Pastor for not telling my husband that he has cancer.  Then your place has the modality to send my husband a $500 bill and him with cancer"  I apologized and said I would forward the information to [facility] and Dr. Henrene Pastor

## 2014-06-07 NOTE — Telephone Encounter (Signed)
Left message for pt to call back again. After multiple attempts have been unable to reach pt. Letter mailed to pt.

## 2014-06-10 NOTE — Telephone Encounter (Signed)
Called wife's cell number and was able to get in touch with her. States they were upset that the pt did not get his call returned on the 12th because he was in a lot of pain. Discussed with pts wife that the message received did not mention pain at all, message stated pt wanted to know the next step following CT scan. Wife states the pt went to [facility] on the 12th because he was in so much pain and did not get a call back. 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 Wife wanted to know why they were not told he had cancer by GI. Wife stated cancer was present on CT. Discussed with wife that multiple attempts were made to contact pt. She states that her husband did get the messages but did not call us back because he was angry that he was not called back on the 12th. Wife was also upset that they had received a bill for 500.00 and only saw the Doctor for 2 minutes. Case discussed with Anastasia Pall, Director. Note sent to Dr. Henrene Pastor.

## 2014-06-10 NOTE — Telephone Encounter (Signed)
Left message for pts wife to call back. I have attempted to call this pt back multiple times. Message was left for pt to call back. 06/03/14@8 :59am, 06/05/14@3 :26pm, and 06/07/14@9 :44am. Letter was mailed out to pt 06/07/14 after multiple attempts to reach pt. Each time answering machine was reached with male voice stating that you had reached the home of Mr. And Mrs. Polio.

## 2014-06-23 ENCOUNTER — Telehealth: Payer: Self-pay | Admitting: Internal Medicine

## 2014-06-23 DIAGNOSIS — C8581 Other specified types of non-Hodgkin lymphoma, lymph nodes of head, face, and neck: Secondary | ICD-10-CM

## 2014-06-27 DIAGNOSIS — T783XXA Angioneurotic edema, initial encounter: Secondary | ICD-10-CM | POA: Insufficient documentation

## 2014-11-07 ENCOUNTER — Ambulatory Visit (INDEPENDENT_AMBULATORY_CARE_PROVIDER_SITE_OTHER): Payer: No Typology Code available for payment source | Admitting: Nurse Practitioner

## 2014-11-07 ENCOUNTER — Encounter (INDEPENDENT_AMBULATORY_CARE_PROVIDER_SITE_OTHER): Payer: Self-pay

## 2014-11-07 ENCOUNTER — Encounter: Payer: Self-pay | Admitting: Nurse Practitioner

## 2014-11-07 VITALS — BP 100/70 | HR 83 | Temp 97.9°F | Resp 14 | Ht 75.0 in | Wt 201.5 lb

## 2014-11-07 DIAGNOSIS — B029 Zoster without complications: Secondary | ICD-10-CM | POA: Insufficient documentation

## 2014-11-07 MED ORDER — VALACYCLOVIR HCL 1 G PO TABS: 1000.0000 mg | ORAL_TABLET | Freq: Three times a day (TID) | ORAL | Status: DC

## 2014-11-07 MED ORDER — OXYCODONE-ACETAMINOPHEN 2.5-325 MG PO TABS: 1.0000 | ORAL_TABLET | Freq: Four times a day (QID) | ORAL | Status: DC | PRN

## 2014-11-07 NOTE — Progress Notes (Signed)
Subjective:    Patient ID: Brian Atkins, male    DOB: [birth year], 58 y.o.   MRN: [id number]  HPI Mr. Helmstetter is a 58 yo male with a cc of rash.  Symptoms of Painful rash on left back to abdomen began last night. He was scratching and could not reach his back. His wife helped him and noticed a rash. She had shingles 4 months ago and made the appointment for him to be seen. The rash is on the left side around the T-9 T-10 range. It does not cross midline and is red clustered rash like. There are no vessicles with the rash showing up last night, but the red papules are clustered and patchy. He describes it as very sensitive, shooting pains, and pruritic. He denies burning sensation.    *Due to his history of chemo for Leukemia and Lymphoma (being seen at [facility] and treated) we called the office of Dr. Lanette Hampshire with [facility] to run the medications by them. The wife reached the secretary and explained the situation. The secretary transferred [place] to the triage nurse for Dr. Dorothea Ogle who assured [place] that the medications prescribed at this visit are okay to take. He is not due for chemo for 3 more weeks.    Review of Systems  See HPI for pertinent positives. All other symptoms denied.  Past Medical History  Diagnosis Date  . Medial epicondylitis of right elbow   . Meralgia paresthetica of right side   . Microscopic hematuria   . Dyslipidemia   . Labile hypertension   . Hemorrhoids   . Diverticulitis   . GERD (gastroesophageal reflux disease)   . Hiatal hernia   . Diverticulosis     History   Social History  . Marital Status: Married    Spouse Name: N/A    Number of Children: 2  . Years of Education: 14   Occupational History  . Shop [employer]   Social History Main Topics  . Smoking status: Never Smoker   . Smokeless tobacco: Never Used  . Alcohol Use: No  . Drug Use: No  . Sexual Activity: Yes   Other Topics Concern  . Not on file   Social History  Narrative    Past Surgical History  Procedure Laterality Date  . Nissen funduplication  2505    [name]    Family History  Problem Relation Age of Onset  . Coronary artery disease Father 29    CABG  . Aortic aneurysm Father 107    repaired  . Hyperlipidemia Father   . Lung cancer Mother   . Hypertension Mother   . Cancer Mother   . COPD Mother   . Stomach cancer Maternal Grandfather     Allergies  Allergen Reactions  . Avelox [Moxifloxacin]   . Crestor [Rosuvastatin Calcium]   . Morphine And Related   . Niacin-Simvastatin Er     Current Outpatient Prescriptions on File Prior to Visit  Medication Sig Dispense Refill  . carvedilol (COREG) 6.25 MG tablet TAKE ONE TABLET BY MOUTH TWICE DAILY 180 tablet 1  . fexofenadine (ALLEGRA) 180 MG tablet Take 180 mg by mouth daily.    [name] losartan-hydrochlorothiazide (HYZAAR) 100-25 MG per tablet TAKE ONE TABLET BY MOUTH ONE TIME DAILY 90 tablet 1  . mometasone (NASONEX) 50 MCG/ACT nasal spray Place 2 sprays into the nose daily.    . montelukast (SINGULAIR) 10 MG tablet Take 1 tablet by  mouth daily at 8 pm.    . hyoscyamine (LEVSIN SL) 0.125 MG SL tablet Place 1 tablet (0.125 mg total) under the tongue every 4 (four) hours as needed for cramping. 30 tablet 0   No current facility-administered medications on file prior to visit.    BP 100/70 mmHg  Pulse 83  Temp(Src) 97.9 F (36.6 C) (Oral)  Resp 14  Ht 6\' 3"  (1.905 m)  Wt 201 lb 8 oz (91.4 kg)  BMI 25.19 kg/m2  SpO2 97%     Objective:   Physical Exam  Constitutional: He is oriented to person, place, and time. He appears well-developed and well-nourished. No distress.  HENT:  Head: Normocephalic and atraumatic.  Right Ear: External ear normal.  Left Ear: External ear normal.  Eyes: Right eye exhibits no discharge. Left eye exhibits no discharge. No scleral icterus.  Neck: Normal range of motion. Neck supple.  Neurological: He is alert and oriented to person, place, and  time.  Skin: Rash noted. He is not diaphoretic.  Psychiatric: He has a normal mood and affect. His behavior is normal. Judgment and thought content normal.        Assessment & Plan:

## 2014-11-07 NOTE — Patient Instructions (Addendum)
Please make a follow up for 1 week (anytime next week)  The Percocet is for pain and will make you drowsy so do not take if working or going to drive.  Shingles Shingles (herpes zoster) is an infection that is caused by the same virus that causes chickenpox (varicella). The infection causes a painful skin rash and fluid-filled blisters, which eventually break open, crust over, and heal. It may occur in any area of the body, but it usually affects only one side of the body or face. The pain of shingles usually lasts about 1 month. However, some people with shingles may develop long-term (chronic) pain in the affected area of the body. Shingles often occurs many years after the person had chickenpox. It is more common:  In people older than 50 years.  In people with weakened immune systems, such as those with HIV, AIDS, or cancer.  In people taking medicines that weaken the immune system, such as transplant medicines.  In people under great stress. CAUSES  Shingles is caused by the varicella zoster virus (VZV), which also causes chickenpox. After a person is infected with the virus, it can remain in the person's body for years in an inactive state (dormant). To cause shingles, the virus reactivates and breaks out as an infection in a nerve root. The virus can be spread from person to person (contagious) through contact with open blisters of the shingles rash. It will only spread to people who have not had chickenpox. When these people are exposed to the virus, they may develop chickenpox. They will not develop shingles. Once the blisters scab over, the person is no longer contagious and cannot spread the virus to others. SIGNS AND SYMPTOMS  Shingles shows up in stages. The initial symptoms may be pain, itching, and tingling in an area of the skin. This pain is usually described as burning, stabbing, or throbbing.In a few days or weeks, a painful red rash will appear in the area where the pain,  itching, and tingling were felt. The rash is usually on one side of the body in a band or belt-like pattern. Then, the rash usually turns into fluid-filled blisters. They will scab over and dry up in approximately 2-3 weeks. Flu-like symptoms may also occur with the initial symptoms, the rash, or the blisters. These may include:  Fever.  Chills.  Headache.  Upset stomach. DIAGNOSIS  Your health care provider will perform a skin exam to diagnose shingles. Skin scrapings or fluid samples may also be taken from the blisters. This sample will be examined under a microscope or sent to a lab for further testing. TREATMENT  There is no specific cure for shingles. Your health care provider will likely prescribe medicines to help you manage the pain, recover faster, and avoid long-term problems. This may include antiviral drugs, anti-inflammatory drugs, and pain medicines. HOME CARE INSTRUCTIONS   Take a cool bath or apply cool compresses to the area of the rash or blisters as directed. This may help with the pain and itching.   Take medicines only as directed by your health care provider.   Rest as directed by your health care provider.  Keep your rash and blisters clean with mild soap and cool water or as directed by your health care provider.  Do not pick your blisters or scratch your rash. Apply an anti-itch cream or numbing creams to the affected area as directed by your health care provider.  Keep your shingles rash covered with a loose  bandage (dressing).  Avoid skin contact with:  Babies.   Pregnant women.   Children with eczema.   Elderly people with transplants.   People with chronic illnesses, such as leukemia or AIDS.   Wear loose-fitting clothing to help ease the pain of material rubbing against the rash.  Keep all follow-up visits as directed by your health care provider.If the area involved is on your face, you may receive a referral for a specialist, such as  an eye doctor (ophthalmologist) or an ear, nose, and throat (ENT) doctor. Keeping all follow-up visits will help you avoid eye problems, chronic pain, or disability.  SEEK IMMEDIATE MEDICAL CARE IF:   You have facial pain, pain around the eye area, or loss of feeling on one side of your face.  You have ear pain or ringing in your ear.  You have loss of taste.  Your pain is not relieved with prescribed medicines.   Your redness or swelling spreads.   You have more pain and swelling.  Your condition is worsening or has changed.   You have a fever. MAKE SURE YOU:  Understand these instructions.  Will watch your condition.  Will get help right away if you are not doing well or get worse. Document Released: 12/06/2005 Document Revised: 04/22/2014 Document Reviewed: 07/20/2012 [facility] Patient Information 2015 [place], [place]. This information is not intended to replace advice given to you by your health care provider. Make sure you discuss any questions you have with your health care provider.

## 2014-11-07 NOTE — Progress Notes (Signed)
Pre visit review using our clinic review tool, if applicable. No additional management support is needed unless otherwise documented below in the visit note. 

## 2014-11-07 NOTE — Assessment & Plan Note (Addendum)
New onset. Pt was given Valacyclovir x 7 days and Percocet for pain prn. He was given a handout with Shingles information. He is to make a FU appnt for 1 wk. Was advised about Percocet and drowsiness. He has not had past problems with percocet and discussed using for pain prn and stop if having a reaction. Okayed Valtrex with Dr. Gennie Alma triage RN on the phone.

## 2014-11-18 ENCOUNTER — Encounter: Payer: Self-pay | Admitting: Nurse Practitioner

## 2014-11-18 ENCOUNTER — Ambulatory Visit (INDEPENDENT_AMBULATORY_CARE_PROVIDER_SITE_OTHER): Payer: No Typology Code available for payment source | Admitting: Nurse Practitioner

## 2014-11-18 VITALS — BP 128/74 | HR 83 | Temp 97.8°F | Resp 14 | Wt 212.8 lb

## 2014-11-18 DIAGNOSIS — B0229 Other postherpetic nervous system involvement: Secondary | ICD-10-CM

## 2014-11-18 MED ORDER — AMITRIPTYLINE HCL 10 MG PO TABS
10.0000 mg | ORAL_TABLET | Freq: Every day | ORAL | Status: DC
Start: 2014-11-18 — End: 2015-09-29

## 2014-11-18 NOTE — Progress Notes (Signed)
Pre visit review using our clinic review tool, if applicable. No additional management support is needed unless otherwise documented below in the visit note. 

## 2014-11-18 NOTE — Assessment & Plan Note (Signed)
Uncontrolled. Patient describes not sleeping well and having intermittent, but daily neuropathic pain. Trial of Amitriptyline 10 mg QHS. He was instructed to call if worsening or not resolving.

## 2014-11-18 NOTE — Progress Notes (Signed)
Subjective:    Patient ID: Brian Atkins, male    DOB: 1956-03-07, 58 y.o.   MRN: [id number]  HPI  Brian Atkins is a 58 yo male here today to follow up for herpes zoster. He states he has completed all of the Valtrex and is having some nerve pain.   1) Shingles- The percocet was helpful for taking the edge off of the pain he stated. He only took 3 (one at a time) when the pain was at its greatest. He did not feel this was helpful for the shooting pains and sensitivity he is now feeling with the resolving shingles. He states they are daily, intermittent, and affecting sleep. There are no other concerns at this time. He has another Chemo treatment next Monday.    Review of Systems  See HPI. Denies all other symptoms.   Past Medical History  Diagnosis Date  . Medial epicondylitis of right elbow   . Meralgia paresthetica of right side   . Microscopic hematuria   . Dyslipidemia   . Labile hypertension   . Hemorrhoids   . Diverticulitis   . GERD (gastroesophageal reflux disease)   . Hiatal hernia   . Diverticulosis     History   Social History  . Marital Status: Married    Spouse Name: N/A    Number of Children: 2  . Years of Education: 14   Occupational History  . [employer]   Social History Main Topics  . Smoking status: Never Smoker   . Smokeless tobacco: Never Used  . Alcohol Use: No  . Drug Use: No  . Sexual Activity: Yes   Other Topics Concern  . Not on file   Social History Narrative    Past Surgical History  Procedure Laterality Date  . Nissen funduplication  7741    [name]    Family History  Problem Relation Age of Onset  . Coronary artery disease Father 105    CABG  . Aortic aneurysm Father 27    repaired  . Hyperlipidemia Father   . Lung cancer Mother   . Hypertension Mother   . Cancer Mother   . COPD Mother   . Stomach cancer Maternal Grandfather     Allergies  Allergen Reactions  . Avelox [Moxifloxacin]   . Crestor  [Rosuvastatin Calcium]   . Morphine And Related   . Niacin-Simvastatin Er     Current Outpatient Prescriptions on File Prior to Visit  Medication Sig Dispense Refill  . carvedilol (COREG) 6.25 MG tablet TAKE ONE TABLET BY MOUTH TWICE DAILY 180 tablet 1  . esomeprazole (NEXIUM) 40 MG capsule Take 40 mg by mouth daily at 12 noon.    . famotidine-calcium carbonate-magnesium hydroxide (PEPCID COMPLETE) 10-800-165 MG CHEW chewable tablet Chew 1 tablet by mouth 4 (four) times daily.    . fexofenadine (ALLEGRA) 180 MG tablet Take 180 mg by mouth daily.    [name] LORazepam (ATIVAN) 1 MG tablet Take 1 mg by mouth every 8 (eight) hours as needed.   3  . losartan-hydrochlorothiazide (HYZAAR) 100-25 MG per tablet TAKE ONE TABLET BY MOUTH ONE TIME DAILY 90 tablet 1  . mometasone (NASONEX) 50 MCG/ACT nasal spray Place 2 sprays into the nose daily.    . montelukast (SINGULAIR) 10 MG tablet Take 1 tablet by mouth daily at 8 pm.    . ondansetron (ZOFRAN) 8 MG tablet Take 8 mg by mouth every 8 (eight) hours as  needed. for nausea  3  . oxycodone-acetaminophen (PERCOCET) 2.5-325 MG per tablet Take 1 tablet by mouth every 6 (six) hours as needed for pain. 20 tablet 0  . prochlorperazine (COMPAZINE) 5 MG tablet Take 5 mg by mouth every 6 (six) hours as needed.   0  . hyoscyamine (LEVSIN SL) 0.125 MG SL tablet Place 1 tablet (0.125 mg total) under the tongue every 4 (four) hours as needed for cramping. 30 tablet 0   No current facility-administered medications on file prior to visit.    BP 128/74 mmHg  Pulse 83  Temp(Src) 97.8 F (36.6 C) (Oral)  Resp 14  Wt 212 lb 12 oz (96.503 kg)  SpO2 97%        Objective:   Physical Exam  Skin- Shingles rash is crusted over and healing well. No signs of infection.      Assessment & Plan:

## 2014-11-18 NOTE — Patient Instructions (Signed)
Take 1 tablet at bedtime for the nerve pain associated with Shingles If you have worsening symptoms or side effects please call us.

## 2015-01-04 DIAGNOSIS — C8599 Non-Hodgkin lymphoma, unspecified, extranodal and solid organ sites: Secondary | ICD-10-CM | POA: Insufficient documentation

## 2015-01-19 DIAGNOSIS — Z9221 Personal history of antineoplastic chemotherapy: Secondary | ICD-10-CM | POA: Insufficient documentation

## 2015-02-26 DIAGNOSIS — D7389 Other diseases of spleen: Secondary | ICD-10-CM | POA: Insufficient documentation

## 2015-03-19 LAB — CBC AND DIFFERENTIAL
HEMATOCRIT: 40 % — AB (ref 41–53)
Hemoglobin: 13.3 g/dL — AB (ref 13.5–17.5)
Neutrophils Absolute: 6 /uL
PLATELETS: 738 10*3/uL — AB (ref 150–399)
WBC: 7.7 10*3/mL

## 2015-03-19 LAB — HEPATIC FUNCTION PANEL
ALK PHOS: 109 U/L (ref 25–125)
ALT: 87 U/L — AB (ref 10–40)
AST: 44 U/L — AB (ref 14–40)
BILIRUBIN, TOTAL: 0.5 mg/dL

## 2015-03-19 LAB — BASIC METABOLIC PANEL
BUN: 13 mg/dL (ref 4–21)
CREATININE: 1 mg/dL (ref 0.6–1.3)
Glucose: 97 mg/dL
POTASSIUM: 3.4 mmol/L (ref 3.4–5.3)
Sodium: 138 mmol/L (ref 137–147)

## 2015-03-20 ENCOUNTER — Encounter: Payer: Self-pay | Admitting: *Deleted

## 2015-03-21 ENCOUNTER — Other Ambulatory Visit: Payer: Self-pay | Admitting: Internal Medicine

## 2015-03-21 ENCOUNTER — Telehealth: Payer: Self-pay | Admitting: Internal Medicine

## 2015-03-21 MED ORDER — POTASSIUM CHLORIDE CRYS ER 20 MEQ PO TBCR: 20.0000 meq | EXTENDED_RELEASE_TABLET | Freq: Every day | ORAL | Status: DC

## 2015-03-21 NOTE — Telephone Encounter (Signed)
Spoke with pt advised of MDs message.  Pt verbalized understanding. 

## 2015-03-21 NOTE — Telephone Encounter (Signed)
Recent labs drawn at [facility] showed low potassium.  Potassium chloirde tablets called in ,  One tablet daild with food

## 2015-03-21 NOTE — Telephone Encounter (Signed)
Left message for pt to return my call.

## 2015-03-26 ENCOUNTER — Other Ambulatory Visit: Payer: Self-pay | Admitting: Internal Medicine

## 2015-04-01 LAB — BASIC METABOLIC PANEL
BUN: 11 mg/dL (ref 4–21)
CREATININE: 0.9 mg/dL (ref 0.6–1.3)
Glucose: 98 mg/dL
POTASSIUM: 3.4 mmol/L (ref 3.4–5.3)
SODIUM: 138 mmol/L (ref 137–147)

## 2015-04-01 LAB — CBC AND DIFFERENTIAL
HCT: 40 % — AB (ref 41–53)
Hemoglobin: 13.4 g/dL — AB (ref 13.5–17.5)
Platelets: 494 10*3/uL — AB (ref 150–399)
WBC: 6.7 10^3/mL

## 2015-04-01 LAB — HEPATIC FUNCTION PANEL
ALK PHOS: 107 U/L (ref 25–125)
ALT: 52 U/L — AB (ref 10–40)
AST: 30 U/L (ref 14–40)
Bilirubin, Total: 0.2 mg/dL

## 2015-04-03 ENCOUNTER — Telehealth: Payer: Self-pay | Admitting: Internal Medicine

## 2015-04-03 NOTE — Telephone Encounter (Signed)
Patient stated he was at [facility] on 04/02/15 for a PET scan and was advised he needed to start the Potassium back he had stopped for a while patient advised he has started the potassium again on 04/02/15.

## 2015-04-03 NOTE — Telephone Encounter (Signed)
his potassium is still a little low,  based on labs drom [facility] done Tuesday . Is he taking the potassium chloride supplement ?

## 2015-04-12 NOTE — H&P (Signed)
PATIENT NAME:  Brian Atkins, SEESE MR#:  [id number] DATE OF BIRTH:  1956/11/05  DATE OF ADMISSION:  03/27/2014  PRIMARY CARE PHYSICIAN: Dr. Derrel Nip   REFERRING PHYSICIAN: Dr. Jasmine December.   CHIEF COMPLAINT: Abdominal pain and nausea.   HISTORY OF PRESENT ILLNESS: The patient is a 59 year old Caucasian male with a past medical history of hypertension, hyperlipidemia and GERD. He is presenting to the ER with a chief complaint of severe lower abdominal pain associated with nausea. The patient is reporting that he was in his usual state of health until yesterday. Today at around 4:00 p.m. he started developing lower abdominal pain, which was squeezing and crampy in nature. Associated with nausea but denies any vomiting or diarrhea. He felt feverish, associated with chills and rigors. He denies any recent travel or sick contacts. Denies any history of ulcerative colitis or Crohn disease, and had a colonoscopy done eight years ago which was normal. CAT scan of the abdomen and pelvis has revealed acute enteritis. Blood cultures were obtained, and the patient was started on IV Zosyn and Flagyl. Hospitalist team is called to admit the patient. During my examination, patient's abdominal pain is slightly better, as he was given pain medications. He feels bloated and distended. No similar complaints in the past, and no family members at bedside. Denies any chest pain or shortness of breath. The lower abdominal pain is without any radiation. Initially, it was 10 out of 10, but during my examination, the pain was at 4 out of 10.   PAST MEDICAL HISTORY: Hypertension, hyperlipidemia, GERD.   PAST SURGICAL HISTORY: Nissen fundoplication.   ALLERGIES: AVELOX AND MORPHINE.   PSYCHOSOCIAL HISTORY: Lives at home with wife. Denies smoking, alcohol or illicit drug usage.   FAMILY HISTORY: Mom had history of diverticulitis and lung cancer. Dad had history of coronary artery disease and status post CABG.   HOME MEDICATIONS: The  list is not updated, and medication reconciliation is not done yet. The patient has reported that he is not on any cholesterol medications.   REVIEW OF SYSTEMS:  CONSTITUTIONAL: Denies any fever, but felt diaphoretic associated with chills and rigors. Feeling weak. Denies any weight loss or weight gain.  EYES: Denies blurry vision, double vision, eye pain or redness.  ENT: Denies epistaxis, discharge, ear pain, hearing loss.  RESPIRATION: Denies cough, chronic obstructive pulmonary disease, wheezing, hemoptysis.  CARDIOVASCULAR: No chest pain, palpitations, syncope.  GASTROINTESTINAL: Complaining of nausea. Denies vomiting, diarrhea. Complaining of lower abdominal pain. Denies hematemesis, melena.  GENITOURINARY: No dysuria, hematuria, frequency.  ENDOCRINE: Denies polyuria, nocturia, thyroid problems.  HEMATOLOGIC AND LYMPHATIC: No anemia, easy bruising, bleeding.  INTEGUMENTARY: Denies acne, rash lesions.  MUSCULOSKELETAL: No joint pain in the neck, back. Denies gout, arthritis.  NEUROLOGIC: Denies vertigo, ataxia, dementia, headache, dysarthria. PSYCHIATRIC: No ADD, OCD, anxiety, insomnia.   PHYSICAL EXAMINATION: VITAL SIGNS: Temperature 97.5, pulse 82, respirations 16 to 18, blood pressure 117/74, pulse oximetry 97% on room air.  GENERAL APPEARANCE: Not in any acute distress. Moderately built and nourished.  HEENT: Normocephalic, atraumatic. Pupils are equally reacting to light and accommodation. No scleral icterus. No conjunctival injection. No sinus tenderness. No postnasal drip. Moist mucous membranes.  NECK: Supple. No JVD or thyromegaly. Range of motion is intact.  LUNGS: Clear to auscultation bilaterally. No accessory muscle use and no anterior chest wall tenderness on palpation.  CARDIOVASCULAR: S1 and S2 normal. Regular rate and rhythm. No murmurs.  GASTROINTESTINAL: Soft but distended. Bowel sounds are positive in all four quadrants. Diffuse  tenderness is present, but more tender  in the lower abdominal area, in the umbilical and the left lower quadrant. No rebound tenderness. No masses felt.  NEUROLOGIC: Awake, alert, and oriented x3. Motor and sensory grossly intact. Reflexes are 2+. Cranial nerves II through XII are intact.  EXTREMITIES: No edema. No cyanosis. No clubbing.  SKIN: Warm to touch. Normal turgor. No rashes. No lesions.  MUSCULOSKELETAL: No joint effusion, tenderness, or erythema.  PSYCHIATRIC: Normal mood and affect.   LABORATORY AND IMAGING STUDIES: LFTs are normal. Troponin less than 0.02. WBC 16.3, hemoglobin 17.0, hematocrit 49.8, platelets are 209. Urinalysis: Straw-colored, clear in appearance. Nitrite and leukocyte esterase are negative. Troponin less than 0.02. Chem-8: Lipase is at 70, glucose 120, BUN 23. The rest of the Chem-8 is normal. CAT scan of the abdomen and pelvis with contrast has revealed scattered areas of wall thickening involving the small bowel, most prominent over ileum, in the left lower quadrant. No definite bowel obstruction or perforation, mild associated patchy ascites. Findings are likely due to infectious enteritis; cannot exclude inflammatory bowel disease such as Crohn disease. Mild colonic diverticulosis. Minimal stranding of fat adjacent to the sigmoid colon, indicative of diverticulitis. As 1.5 cm cyst over the dome of the right lobe of the liver. Multiple other smaller scattered liver hypodensities, too small to characterize, but slightly cysts or hemangiomas, although metastatic disease or infection could have this appearance in the proper clinical setting. Several small renal cortical hypodensities, likely cysts but too small to characterize. Largest hypodensity over the mid pole of the right kidney, measuring 1.8 cm; recommended follow-up CT one year.  ASSESSMENT AND PLAN: A 59 year old male presenting to the ER with a chief complaint of acute lower abdominal pain, associated with nausea, diagnosed with acute infectious or  inflammatory enteritis. Will be admitted with following assessment and plan.   1.  Acute lower abdominal pain secondary to acute enteritis, probably infectious or inflammatory; cannot rule out Crohn disease. The patient will be admitted to medical/surgical floor. Will keep him on nothing by mouth. Will provide him IV Zosyn and Flagyl. If there is no improvement, will consider gastroenterology consult. Either way, the patient will be benefited with gastrointestinal follow-up as an outpatient or inpatient, as irritable bowel disease needs to be ruled out.  2.  Hypertension. Currently, blood pressure is stable. Home medications are not reconciled yet. We will resume the medications once medication reconciliation is done.  3.  Hyperlipidemia. The patient is not on any medications, as reported by him.  4.  Gastroesophageal reflux disease. We will put him on gastrointestinal prophylaxis.  5.  We will provide deep vein thrombosis prophylaxis.   Diagnosis and plan of care discussed with the patient. He is aware of the plan. He is full code.  TOTAL TIME SPENT ON ADMISSION: 50 minutes.    ____________________________ Nicholes Mango, MD ag:cg D: 03/27/2014 00:45:18 ET T: 03/27/2014 01:39:10 ET JOB#: [id number]  cc: Nicholes Mango, MD, <Dictator> Deborra Medina, MD  Nicholes Mango MD ELECTRONICALLY SIGNED 04/14/2014 8:13

## 2015-04-12 NOTE — Discharge Summary (Signed)
PATIENT NAME:  Brian Atkins, Brian Atkins MR#:  [id number] DATE OF BIRTH:  1956-02-24  DATE OF ADMISSION:  03/26/2014 DATE OF DISCHARGE:  03/28/2014  DISCHARGE DIAGNOSIS:  Abdominal pain and nausea, likely from viral gastroenteritis, now resolved and tolerating diet fine.   SECONDARY DIAGNOSES:  1. Hypertension.  2. Hyperlipidemia.  3. Gastroesophageal reflux disease.   CONSULTATIONS: GI, Dr. Lucilla Lame.  PROCEDURES AND RADIOLOGY: CT scan of the abdomen and pelvis with contrast on April 7th  showed mild colonic diverticulosis.  Possible colitis/infectious enteritis.   Abdominal x-ray on April 9th showed findings suggestive of small bowel ileus or gastroenteritis. No obstruction.   MAJOR LABORATORY PANEL: Urinalysis on admission was negative.   Stool for Clostridium difficile was negative. Stool for Campylobacter was negative.   HISTORY AND SHORT HOSPITAL COURSE: The patient is a 59 year old male with above-mentioned medical problems who was admitted for abdominal pain and nausea, worrisome for possible acute enteritis. Please see Dr. Rinaldo Ratel dictated history and physical for further details. GI consultation was obtained with Dr. Lucilla Lame who felt this to be possible gastroenteritis. The patient's stool studies were negative. He was feeling significantly better with Cipro and Flagyl, so this was continued.  Although patient did not have any more abdominal pain, he certainly could have gas which could be causing him to have so much pain also, but by April 9th, he was feeling significantly better. He was passing gas. He did not have any further nausea and was tolerating diet and was discharged home in stable condition.  On the date of discharge, his vital signs were as follows: temperature 98.5, heart rate 73 per minute, respirations 19 per minute, blood pressure 121/72 mmHg. He was saturating 96% on room air.   PERTINENT PHYSICAL EXAMINATION ON THE DATE OF DISCHARGE:  CARDIOVASCULAR: S1, S2 normal. No  murmurs, rales, or gallop.  LUNGS: Clear to auscultation bilaterally. No wheezing, rales, rhonchi, or crepitation.  ABDOMEN: Soft, benign.  NEUROLOGIC: Nonfocal examination.   All other physical examination remained at baseline.   DISCHARGE MEDICATIONS:  1. Singulair once daily.  2. Allegra once daily.  3. Hydrochlorothiazide/losartan 25/100 mg 1 tablet p.o. daily.  4. Coreg 6.25 mg p.o. b.i.d.  5. Nasacort 2 sprays intranasally daily.  6. Fexofenadine 180 mg p.o. at bedtime.  7. Flagyl 500 mg p.o. every 8 hours for 5 more days.   DISCHARGE DIET: Low sodium. Eat light for the first meal and advance the diet as tolerated.   DISCHARGE ACTIVITY: As tolerated.   DISCHARGE INSTRUCTIONS AND FOLLOWUP: The patient was instructed to follow up with his primary care physician, Dr. Deborra Medina, in 1-2 weeks. He will need followup with his GI physician, Dr. Henrene Pastor, in 2-4 weeks.   TOTAL TIME DISCHARGING THIS PATIENT: 55 minutes.    ____________________________ Lucina Mellow. Manuella Ghazi, MD vss:dd D: 03/28/2014 23:14:00 ET T: 03/29/2014 00:39:43 ET JOB#: [id number]  cc: Deborra Medina, MD Dr. Henrene Pastor Stefanos Haynesworth S. Manuella Ghazi, MD, <Dictator>   Lucina Mellow [facility] MD ELECTRONICALLY SIGNED 03/30/2014 0:12

## 2015-04-12 NOTE — Consult Note (Signed)
Brief Consult Note: Diagnosis: Abd pain and bloating.   Patient was seen by consultant.   Consult note dictated.   Comments: The patient reports his pain to be a 1 out of 10 and he is passing gas. No other complaints. Tolerating clears. Will advance to a soft diet. Likely gastroenteritis. Follow up as an out patient with primary GI ( Dr. Henrene Pastor).  Electronic Signatures: Lucilla Lame (MD)  (Signed 09-Apr-15 06:39)  Authored: Brief Consult Note   Last Updated: 09-Apr-15 06:39 by Lucilla Lame (MD)

## 2015-04-24 ENCOUNTER — Other Ambulatory Visit: Payer: Self-pay | Admitting: Internal Medicine

## 2015-04-30 ENCOUNTER — Encounter: Payer: Self-pay | Admitting: Internal Medicine

## 2015-05-18 DIAGNOSIS — Z86718 Personal history of other venous thrombosis and embolism: Secondary | ICD-10-CM | POA: Insufficient documentation

## 2015-05-23 ENCOUNTER — Other Ambulatory Visit: Payer: Self-pay | Admitting: Internal Medicine

## 2015-06-24 ENCOUNTER — Encounter: Payer: Self-pay | Admitting: Internal Medicine

## 2015-07-17 ENCOUNTER — Other Ambulatory Visit: Payer: Self-pay | Admitting: Internal Medicine

## 2015-07-17 NOTE — Telephone Encounter (Signed)
Second request for refill patient has not scheduled OV last OV 11/15 please advise.

## 2015-08-18 ENCOUNTER — Other Ambulatory Visit: Payer: Self-pay | Admitting: Internal Medicine

## 2015-08-18 NOTE — Telephone Encounter (Signed)
Last OV 11.30.15.  Please advise refill

## 2015-08-18 NOTE — Telephone Encounter (Signed)
Refill authorized, because he has had labs done recently at [facility]

## 2015-09-15 ENCOUNTER — Other Ambulatory Visit: Payer: Self-pay | Admitting: Internal Medicine

## 2015-09-15 MED ORDER — LORAZEPAM 1 MG PO TABS: 1.0000 mg | ORAL_TABLET | Freq: Three times a day (TID) | ORAL | Status: DC | PRN

## 2015-09-15 MED ORDER — PROCHLORPERAZINE MALEATE 5 MG PO TABS: 5.0000 mg | ORAL_TABLET | Freq: Four times a day (QID) | ORAL | Status: DC | PRN

## 2015-09-29 ENCOUNTER — Encounter: Payer: Self-pay | Admitting: Internal Medicine

## 2015-09-29 ENCOUNTER — Ambulatory Visit (INDEPENDENT_AMBULATORY_CARE_PROVIDER_SITE_OTHER): Payer: BLUE CROSS/BLUE SHIELD | Admitting: Internal Medicine

## 2015-09-29 VITALS — BP 128/70 | HR 79 | Temp 98.3°F | Resp 12 | Ht 74.0 in | Wt 206.8 lb

## 2015-09-29 DIAGNOSIS — G4730 Sleep apnea, unspecified: Secondary | ICD-10-CM | POA: Diagnosis not present

## 2015-09-29 DIAGNOSIS — R1084 Generalized abdominal pain: Secondary | ICD-10-CM | POA: Diagnosis not present

## 2015-09-29 DIAGNOSIS — D735 Infarction of spleen: Secondary | ICD-10-CM

## 2015-09-29 DIAGNOSIS — I82890 Acute embolism and thrombosis of other specified veins: Secondary | ICD-10-CM

## 2015-09-29 DIAGNOSIS — R682 Dry mouth, unspecified: Secondary | ICD-10-CM

## 2015-09-29 DIAGNOSIS — G4701 Insomnia due to medical condition: Secondary | ICD-10-CM | POA: Diagnosis not present

## 2015-09-29 DIAGNOSIS — R0982 Postnasal drip: Secondary | ICD-10-CM

## 2015-09-29 MED ORDER — AMITRIPTYLINE HCL 50 MG PO TABS: 50.0000 mg | ORAL_TABLET | Freq: Every day | ORAL | Status: DC

## 2015-09-29 MED ORDER — IPRATROPIUM BROMIDE 0.06 % NA SOLN: 2.0000 | Freq: Four times a day (QID) | NASAL | Status: DC

## 2015-09-29 NOTE — Progress Notes (Signed)
Subjective:  Patient ID: Brian Atkins, male    DOB: November 01, 1956  Age: 59 y.o. MRN: [id number]  CC: The primary encounter diagnosis was Generalized abdominal pain. Diagnoses of Sleep apnea, Insomnia due to medical condition, Deep vein thrombosis of splenic vein, Post-nasal drip, and Dry mouth were also pertinent to this visit.  HPI Brian Atkins presents for follow up on chronic issues.  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  He reported periodic and frequent abdominal symptoms of pain suggestive of intermittent obstruction of the bowels and lost 15 lbs.  He underwent EGD  In July 2015 , all biopsies were negative for lymphoma involvement but colonoscopy was deferred,  He started XRuly 8 2015 but   Developed angioedema requiring emergent intubation. XRT was discontinued and he underwent and finished systemic chemo (BR) 6 cycles .  He underwent splenectomy March 1093 which was complicated by splenic vein thrombosis requiring use of Levonex for 6 months.  His  Last PET was  done July showing improvement in tumor load. Labs normal July 2016 including CBC CMET and glucose    He states that he is uncertain  he would go through systemic treatment again. He has many subjective complaints today that he is requesting evaluation for,    1) not sleeping well despite use of  ambien 10 mg and 3 mg melatonin .  Still sleeping lightly,  Wakes Up at 4 am .gets nighttime  occipital headaches has been using Excedrin   Averaging 3 nights per week  He has PND keeping him up with cough.  He was treated with Biaxin by [facility] 3 months ago.  Having clear post nasal drip causing a nighttime time cough,  Not coughing during the day   3) Continues to have post prandial nausea and abdominal pain   4) dry mouth.   Treated for candida in the past,   Now using Biotene several times daily. enies pain with swallowing, denies tongue pain.   Outpatient Prescriptions Prior to Visit  Medication Sig Dispense Refill  . famotidine-calcium carbonate-magnesium hydroxide (PEPCID COMPLETE) 10-800-165 MG CHEW chewable tablet Chew 1 tablet by mouth 4 (four) times daily.    . fexofenadine (ALLEGRA) 180 MG tablet Take 180 mg by mouth daily.    [name] LORazepam (ATIVAN) 1 MG tablet Take 1 tablet (1 mg total) by mouth every 8 (eight) hours as needed. 30 tablet 3  . losartan-hydrochlorothiazide (HYZAAR) 100-25 MG per tablet TAKE ONE TABLET BY MOUTH ONE TIME DAILY 30 tablet 0  . mometasone (NASONEX) 50 MCG/ACT nasal spray Place 2 sprays into the nose daily.    . montelukast (SINGULAIR) 10 MG tablet Take 1 tablet by mouth daily at 8 pm.    . ondansetron (ZOFRAN) 8 MG tablet Take 8 mg by mouth every 8 (eight) hours as needed. for nausea  3  . potassium chloride SA (K-DUR,KLOR-CON) 20 MEQ tablet Take 1 tablet (20 mEq total) by mouth daily. 30 tablet 3  . prochlorperazine (COMPAZINE) 5 MG tablet Take 1 tablet (5 mg total) by mouth every 6 (six) hours as needed. 30 tablet 3  . amitriptyline (ELAVIL) 10 MG tablet Take 1 tablet (10 mg total) by mouth at bedtime. 30 tablet 1  . carvedilol (COREG) 6.25 MG tablet TAKE ONE TABLET BY MOUTH TWICE DAILY (Patient not  taking: Reported on 09/29/2015) 180 tablet 1  . esomeprazole (NEXIUM) 40 MG capsule Take 40 mg by mouth daily at 12 noon.    . hyoscyamine (LEVSIN SL) 0.125 MG SL tablet Place 1 tablet (0.125 mg total) under the tongue every 4 (four) hours as needed for cramping. 30 tablet 0  . oxycodone-acetaminophen (PERCOCET) 2.5-325 MG per tablet Take 1 tablet by mouth every 6 (six) hours as needed for pain. (Patient not taking: Reported on 09/29/2015) 20 tablet 0   No facility-administered medications prior to visit.    Review of Systems;  Patient denies  fevers, , , skin rash, eye pain, sinus congestion and sinus pain, sore  throat, dysphagia,  hemoptysis ,  dyspnea, wheezing, chest pain, palpitations, orthopnea, edema,  melena, diarrhea, constipation, flank pain, dysuria, hematuria, urinary  Frequency, nocturia, numbness, tingling, seizures,  Focal weakness, Loss of consciousness,  Tremor, insomnia, depression, anxiety, and suicidal ideation.      Objective:  BP 128/70 mmHg  Pulse 79  Temp(Src) 98.3 F (36.8 C) (Oral)  Resp 12  Ht 6\' 2"  (1.88 m)  Wt 206 lb 12 oz (93.781 kg)  BMI 26.53 kg/m2  SpO2 97%  BP Readings from Last 3 Encounters:  09/29/15 128/70  11/18/14 128/74  11/07/14 100/70    Wt Readings from Last 3 Encounters:  09/29/15 206 lb 12 oz (93.781 kg)  11/18/14 212 lb 12 oz (96.503 kg)  11/07/14 201 lb 8 oz (91.4 kg)    General appearance: alert, cooperative and appears stated age Ears: normal TM's and external ear canals both ears Throat: lips, mucosa normal.  Tongue white  No thrush,   teeth and gums normal Neck: no adenopathy, no carotid bruit, supple, symmetrical, trachea midline and thyroid not enlarged, symmetric, no tenderness/mass/nodules Back: symmetric, no curvature. ROM normal. No CVA tenderness. Lungs: clear to auscultation bilaterally Heart: regular rate and rhythm, S1, S2 normal, no murmur, click, rub or gallop Abdomen: soft, non-tender; bowel sounds normal; no masses,  no organomegaly Pulses: 2+ and symmetric Skin: Skin color, texture, turgor normal. No rashes or lesions Lymph nodes: Cervical, supraclavicular, and axillary nodes normal.  Lab Results  Component Value Date   HGBA1C 5.0 03/02/2013   HGBA1C 5.0 10/25/2011    Lab Results  Component Value Date   CREATININE 0.9 04/01/2015   CREATININE 1.0 03/19/2015   CREATININE 1.3 05/16/2014    Lab Results  Component Value Date   WBC 6.7 04/01/2015   HGB 13.4* 04/01/2015   HCT 40* 04/01/2015   PLT 494* 04/01/2015   GLUCOSE 90 05/16/2014   CHOL 212* 03/02/2013   TRIG 168.0* 03/02/2013   HDL 27.70* 03/02/2013     LDLDIRECT 135.8 03/02/2013   LDLCALC 83 10/25/2011   ALT 52* 04/01/2015   AST 30 04/01/2015   NA 138 04/01/2015   K 3.4 04/01/2015   CL 107 05/16/2014   CREATININE 0.9 04/01/2015   BUN 11 04/01/2015   CO2 28 05/16/2014   TSH 1.16 10/25/2011   PSA 1.92 03/02/2013   HGBA1C 5.0 03/02/2013    Dg Chest 2 View  05/16/2014   CLINICAL DATA:  Multiple splenic and liver lesions suspicious for sarcoid or lymphoma  EXAM: CHEST  2 VIEW  COMPARISON:  CT abdomen pelvis of 03/26/2014 and chest x-ray of 08/14/2009  FINDINGS: No active infiltrate or effusion is seen. No mediastinal or hilar adenopathy is noted. The heart is within normal limits in size. No bony abnormality is seen.  IMPRESSION: No active cardiopulmonary disease.  No mediastinal or hilar adenopathy is noted.   Electronically Signed   By: Ivar Drape M.D.   On: 05/16/2014 09:45    Assessment & Plan:   Problem List Items Addressed This Visit    Sleep apnea    Never treated since diagnosis in 2014,  May be contributing to poor sleep and nocturnal headaches.  Will recommend repeat sleep study       Abdominal pain - Primary    His current pain is subacute /chronic  intermittent,  Does not occur with every meal so it is unlikely to be chronic mesenteric ischemia although this must be considered, given his history of thrombosis of splenic vein following splenectomy in March. Also need to consider recurrence of retroperitoneal lymphadenopathy seen on march 2016 PET scan , not seen on July 2016 PET scan ([facility])   He completed a 6 month course of Lovenox in September.  Discussed repeating a CT angiogram of the abdomen here at [facility],  Bu he has an appt with [facility] in 2 weeks so he has deferred imaging .       Insomnia due to medical condition    Despite daily use of ambien and melatonin. Untreated OSA and rebound headaches also noted. Trial of amitripytline 25 to 50 mg daily in the evening, sleep study advised.       Deep vein thrombosis  of splenic vein    Occurred post operatively after splenectomy March 2016, with extension to the portal vein.  Treated with Lovenox x 6 months, ending in September       Post-nasal drip    No improvement wit steroid nasal spray prescribed by ENT<  Trial of Atrovent nasal spray       Dry mouth    Likely secondary to chemo for treatment of MALT .  Continue Biotene       A total of 40 minutes was spent with patient more than half of which was spent in counseling patient on the above mentioned issues , reviewing and explaining recent labs and imaging studies done, and coordination of care.  I have changed Mr. Graybeal amitriptyline. I am also having him start on ipratropium. Additionally, I am having him maintain his hyoscyamine, montelukast, fexofenadine, mometasone, carvedilol, ondansetron, esomeprazole, famotidine-calcium carbonate-magnesium hydroxide, oxycodone-acetaminophen, potassium chloride SA, losartan-hydrochlorothiazide, LORazepam, prochlorperazine, zolpidem, and Melatonin.  Meds ordered this encounter  Medications  . zolpidem (AMBIEN) 10 MG tablet    Sig: Take 10 mg by mouth at bedtime.     Refill:  4  . Melatonin 3 MG TABS    Sig: Take 1 tablet by mouth at bedtime.   [name] amitriptyline (ELAVIL) 50 MG tablet    Sig: Take 1 tablet (50 mg total) by mouth at bedtime.    Dispense:  30 tablet    Refill:  2  . ipratropium (ATROVENT) 0.06 % nasal spray    Sig: Place 2 sprays into both nostrils 4 (four) times daily.    Dispense:  15 mL    Refill:  12    Medications Discontinued During This Encounter  Medication Reason  . amitriptyline (ELAVIL) 10 MG tablet Reorder    Follow-up: Return in about 3 months (around 12/30/2015).   [name], MD

## 2015-09-29 NOTE — Progress Notes (Signed)
Pre-visit discussion using our clinic review tool. No additional management support is needed unless otherwise documented below in the visit note.  

## 2015-09-29 NOTE — Patient Instructions (Addendum)
For your insomnia and headaches:  Stat with 1/2 tablet amitrpyline at 8 pm.  You may increase to full tablet  if needed.  If not sleepy by 10 ,  Take  the ambien  ,     For the post nasal drip:  Atrovent nasal spray two times daily,  Use it in the Evening and at bedtime for post nasal drip

## 2015-09-30 DIAGNOSIS — G4701 Insomnia due to medical condition: Secondary | ICD-10-CM | POA: Insufficient documentation

## 2015-09-30 DIAGNOSIS — R682 Dry mouth, unspecified: Secondary | ICD-10-CM | POA: Insufficient documentation

## 2015-09-30 DIAGNOSIS — I82890 Acute embolism and thrombosis of other specified veins: Secondary | ICD-10-CM | POA: Insufficient documentation

## 2015-09-30 DIAGNOSIS — R0982 Postnasal drip: Secondary | ICD-10-CM | POA: Insufficient documentation

## 2015-09-30 NOTE — Assessment & Plan Note (Addendum)
Occurred post operatively after splenectomy March 2016, with extension to the portal vein.  Treated with Lovenox x 6 months, ending in September

## 2015-09-30 NOTE — Assessment & Plan Note (Signed)
Despite daily use of ambien and melatonin. Untreated OSA and rebound headaches also noted. Trial of amitripytline 25 to 50 mg daily in the evening, sleep study advised.

## 2015-09-30 NOTE — Assessment & Plan Note (Signed)
Likely secondary to chemo for treatment of MALT .  Continue Biotene

## 2015-09-30 NOTE — Assessment & Plan Note (Addendum)
His current pain is subacute /chronic  intermittent,  Does not occur with every meal so it is unlikely to be chronic mesenteric ischemia although this must be considered, given his history of thrombosis of splenic vein following splenectomy in March. Also need to consider recurrence of retroperitoneal lymphadenopathy seen on march 2016 PET scan , not seen on July 2016 PET scan ([facility])   He completed a 6 month course of Lovenox in September.  Discussed repeating a CT angiogram of the abdomen here at [place],  Bu he has an appt with [facility] in 2 weeks so he has deferred imaging .

## 2015-09-30 NOTE — Assessment & Plan Note (Signed)
Never treated since diagnosis in 2014,  May be contributing to poor sleep and nocturnal headaches.  Will recommend repeat sleep study

## 2015-09-30 NOTE — Assessment & Plan Note (Signed)
No improvement wit steroid nasal spray prescribed by ENT<  Trial of Atrovent nasal spray

## 2015-10-13 ENCOUNTER — Other Ambulatory Visit: Payer: Self-pay | Admitting: Internal Medicine

## 2015-12-03 ENCOUNTER — Other Ambulatory Visit: Payer: Self-pay | Admitting: Internal Medicine

## 2015-12-03 MED ORDER — ZOLPIDEM TARTRATE 10 MG PO TABS: 10.0000 mg | ORAL_TABLET | Freq: Every day | ORAL | Status: DC

## 2015-12-03 NOTE — Telephone Encounter (Signed)
Ok to refill,  printed rx  

## 2015-12-03 NOTE — Telephone Encounter (Signed)
Please advise 

## 2016-01-08 ENCOUNTER — Telehealth: Payer: Self-pay | Admitting: Internal Medicine

## 2016-01-08 ENCOUNTER — Telehealth: Payer: Self-pay | Admitting: *Deleted

## 2016-01-08 NOTE — Telephone Encounter (Signed)
Called to follow up with pt and his wife stated that they in route to [facility] for evaluation./tvw

## 2016-01-08 NOTE — Telephone Encounter (Signed)
Patient Name: Brian Atkins DOB: 22-Mar-1956 Initial Comment Caller states has ringing in ears,aching all over, 138/108, hr 112, heart rate keeps going up and down. he doesn't have a spleen due to lymphoma, shoulders are hurting and in abd area Nurse Assessment Nurse: Marcelline Deist, RN, Kermit Balo Date/Time (Eastern Time): 01/08/2016 8:19:35 AM Confirm and document reason for call. If symptomatic, describe symptoms. You must click the next button to save text entered. ---Caller states has ringing in ears, aching all over, 138/108, HR 112, heart rate keeps going up and down. He doesn't have a spleen due to lymphoma, shoulders are hurting and in abdominal area. Has been having sinus symptoms as well. Has the patient traveled out of the country within the last 30 days? ---Not Applicable Does the patient have any new or worsening symptoms? ---Yes Will a triage be completed? ---Yes Related visit to physician within the last 2 weeks? ---No Does the PT have any chronic conditions? (i.e. diabetes, asthma, etc.) ---Yes List chronic conditions. ---lymphoma, no spleen Is this a behavioral health or substance abuse call? ---No Guidelines Guideline Title Affirmed Question Affirmed Notes Heart Rate and Heartbeat Questions New or worsened shortness of breath with activity (dyspnea on exertion) Final Disposition User Go to ED Now (or PCP triage) Marcelline Deist, RN, Lynda Comments Caller states patient is not in respiratory distress, but is having a hard time with getting breaths in due to congestion in nasal cavities & sinuses. She is concerned he could have the flu or a sinus infection, but without a spleen and because he is already compromised with lymphoma, she worries he won't be able to fight it. She plans to take him to [facility] to be seen where they have all his records. Referrals GO TO FACILITY OTHER - SPECIFY Disagree/Comply: Comply

## 2016-04-05 ENCOUNTER — Ambulatory Visit (INDEPENDENT_AMBULATORY_CARE_PROVIDER_SITE_OTHER): Payer: BLUE CROSS/BLUE SHIELD | Admitting: Internal Medicine

## 2016-04-05 ENCOUNTER — Encounter: Payer: Self-pay | Admitting: Internal Medicine

## 2016-04-05 ENCOUNTER — Encounter (INDEPENDENT_AMBULATORY_CARE_PROVIDER_SITE_OTHER): Payer: Self-pay

## 2016-04-05 VITALS — BP 128/88 | HR 85 | Temp 97.8°F | Resp 14 | Ht 74.0 in | Wt 204.4 lb

## 2016-04-05 DIAGNOSIS — N5089 Other specified disorders of the male genital organs: Secondary | ICD-10-CM | POA: Diagnosis not present

## 2016-04-05 DIAGNOSIS — C8581 Other specified types of non-Hodgkin lymphoma, lymph nodes of head, face, and neck: Secondary | ICD-10-CM | POA: Diagnosis not present

## 2016-04-05 DIAGNOSIS — R0982 Postnasal drip: Secondary | ICD-10-CM

## 2016-04-05 MED ORDER — FUROSEMIDE 20 MG PO TABS: 20.0000 mg | ORAL_TABLET | Freq: Every day | ORAL | Status: DC

## 2016-04-05 NOTE — Progress Notes (Signed)
Subjective:  Patient ID: Brian Atkins, male    DOB: 08/10/1956  Age: 60 y.o. MRN: [id number]  CC: The primary encounter diagnosis was Scrotal edema. Diagnoses of Post-nasal drip and Marginal zone lymphoma of lymph nodes of head, face, and neck ([place]) were also pertinent to this visit.  HPI Brian Atkins presents for  6 month follow up  On multiple chronic complaints including isomnia,  sinus congestion, frontal headache,  RLQ pain, loss of appetite,  and diminutive changes involving his penis (shrinkage) .  Last seen  In October after completing comprehensive  Treatment at [facility] for marginal zone lymphoma involving the left orbit and spleen.  He is  s/p splenectomy.  He underwent Chemotherapy and  radiation which was complicated by severe angioedema requiring intubation for several days to protecx his airway .  Also had severe abdominal  pain and rigors in March 2016 post splenectomy ,  Found to have splenic vein thrombosis,  Required use of Lovenox bid x 6 months.  .  He was referred to GI by Oncology for abdominal pain and workup revealed delayed SB emptying found on recent UGIS with SBFT , normal gastric emptying .   PET scan: one small LN.  No signs of spread .  He is scheduled for MRI head Monday for  Evaluation of bilateral eye swelling He has trouble regulating his body temperature and alternates between feeling Hot and cold all night long  He has recurrent subcutaneous nodules on his chest and arms that resolve spontaneously without rupturing or becoming indurated.   Still Not sleeping well.  Trial of Elavil. Caused excessive prolonged sedation lasting over 12 hours even at the 25 mg dose. Currently wakened recurrently by persistent PND that causes him to wake up choking.  Occasionally clears sinuses of purulent mucus , but mostly clear.  No rigors  Or sinus pain suggestive of infection.  Having to use sudafed PE to use the PND   Feels that his penis has shrunk.  Not sexually  active often, but does have a normal erection when stimulated. Denies pain and discharge,       Outpatient Prescriptions Prior to Visit  Medication Sig Dispense Refill  . amitriptyline (ELAVIL) 50 MG tablet Take 1 tablet (50 mg total) by mouth at bedtime. 30 tablet 2  . carvedilol (COREG) 6.25 MG tablet TAKE ONE TABLET BY MOUTH TWICE DAILY 180 tablet 1  . esomeprazole (NEXIUM) 40 MG capsule Take 40 mg by mouth daily at 12 noon.    . famotidine-calcium carbonate-magnesium hydroxide (PEPCID COMPLETE) 10-800-165 MG CHEW chewable tablet Chew 1 tablet by mouth 4 (four) times daily.    . fexofenadine (ALLEGRA) 180 MG tablet Take 180 mg by mouth daily.    [name] ipratropium (ATROVENT) 0.06 % nasal spray Place 2 sprays into both nostrils 4 (four) times daily. 15 mL 12  . LORazepam (ATIVAN) 1 MG tablet Take 1 tablet (1 mg total) by mouth every 8 (eight) hours as needed. 30 tablet 3  . losartan-hydrochlorothiazide (HYZAAR) 100-25 MG tablet TAKE ONE TABLET BY MOUTH ONE TIME DAILY 30 tablet 5  . Melatonin 3 MG TABS Take 1 tablet by mouth at bedtime.     . mometasone (NASONEX) 50 MCG/ACT nasal spray Place 2 sprays into the nose daily.    . montelukast (SINGULAIR) 10 MG tablet Take 1 tablet by mouth daily at 8 pm.    . ondansetron (ZOFRAN) 8 MG tablet Take 8 mg by mouth every 8 (  eight) hours as needed. for nausea  3  . oxycodone-acetaminophen (PERCOCET) 2.5-325 MG per tablet Take 1 tablet by mouth every 6 (six) hours as needed for pain. 20 tablet 0  . potassium chloride SA (K-DUR,KLOR-CON) 20 MEQ tablet Take 1 tablet (20 mEq total) by mouth daily. 30 tablet 3  . prochlorperazine (COMPAZINE) 5 MG tablet Take 1 tablet (5 mg total) by mouth every 6 (six) hours as needed. 30 tablet 3  . zolpidem (AMBIEN) 10 MG tablet Take 1 tablet (10 mg total) by mouth at bedtime. 30 tablet 4  . hyoscyamine (LEVSIN SL) 0.125 MG SL tablet Place 1 tablet (0.125 mg total) under the tongue every 4 (four) hours as needed for cramping.  30 tablet 0   No facility-administered medications prior to visit.    Review of Systems;  Patient denies headache, fevers, malaise, unintentional weight loss, skin rash, eye pain, sinus congestion and sinus pain, sore throat, dysphagia,  hemoptysis , cough, dyspnea, wheezing, chest pain, palpitations, orthopnea, edema, abdominal pain, nausea, melena, diarrhea, constipation, flank pain, dysuria, hematuria, urinary  Frequency, nocturia, numbness, tingling, seizures,  Focal weakness, Loss of consciousness,  Tremor, insomnia, depression, anxiety, and suicidal ideation.      Objective:  BP 128/88 mmHg  Pulse 85  Temp(Src) 97.8 F (36.6 C) (Oral)  Resp 14  Ht 6\' 2"  (1.88 m)  Wt 204 lb 6.4 oz (92.715 kg)  BMI 26.23 kg/m2  SpO2 95%  BP Readings from Last 3 Encounters:  04/05/16 128/88  09/29/15 128/70  11/18/14 128/74    Wt Readings from Last 3 Encounters:  04/05/16 204 lb 6.4 oz (92.715 kg)  09/29/15 206 lb 12 oz (93.781 kg)  11/18/14 212 lb 12 oz (96.503 kg)   BP 128/88 mmHg  Pulse 85  Temp(Src) 97.8 F (36.6 C) (Oral)  Resp 14  Ht 6\' 2"  (1.88 m)  Wt 204 lb 6.4 oz (92.715 kg)  BMI 26.23 kg/m2  SpO2 95%  General Appearance:    Alert, cooperative, no distress, appears stated age  Head:    Normocephalic, without obvious abnormality, atraumatic  Eyes:    PERRL, conjunctiva/corneas clear, EOM's intact, fundi    benign, bilateral ST swelling, both eyes       Ears:    Normal TM's and external ear canals, both ears  Nose:   Nares normal, septum midline, mucosa normal, no drainage   or sinus tenderness  Throat:   Lips, mucosa, and tongue coated white, normal  teeth and gums normal  Neck:   Supple, symmetrical, trachea midline, no adenopathy;       thyroid:  No enlargement/tenderness/nodules; no carotid   bruit or JVD  Back:     Symmetric, no curvature, ROM normal, no CVA tenderness  Lungs:     Clear to auscultation bilaterally, respirations unlabored  Chest wall:    No  tenderness or deformity  Heart:    Regular rate and rhythm, S1 and S2 normal, no murmur, rub   or gallop  Abdomen:     Soft, non-tender, bowel sounds active all four quadrants,    no masses, no organomegaly  Genitalia:    Normal male without lesion, discharge or tenderness     Extremities:   Extremities normal, atraumatic, no cyanosis or edema  Pulses:   2+ and symmetric all extremities  Skin:   Skin color, texture, turgor normal, no rashes or lesions  Lymph nodes:   Cervical, supraclavicular, and axillary nodes normal  Neurologic:   CNII-XII intact.  Normal strength, sensation and reflexes      throughout    Lab Results  Component Value Date   HGBA1C 5.0 03/02/2013   HGBA1C 5.0 10/25/2011    Lab Results  Component Value Date   CREATININE 0.9 04/01/2015   CREATININE 1.0 03/19/2015   CREATININE 1.3 05/16/2014    Lab Results  Component Value Date   WBC 6.7 04/01/2015   HGB 13.4* 04/01/2015   HCT 40* 04/01/2015   PLT 494* 04/01/2015   GLUCOSE 90 05/16/2014   CHOL 212* 03/02/2013   TRIG 168.0* 03/02/2013   HDL 27.70* 03/02/2013   LDLDIRECT 135.8 03/02/2013   LDLCALC 83 10/25/2011   ALT 52* 04/01/2015   AST 30 04/01/2015   NA 138 04/01/2015   K 3.4 04/01/2015   CL 107 05/16/2014   CREATININE 0.9 04/01/2015   BUN 11 04/01/2015   CO2 28 05/16/2014   TSH 1.16 10/25/2011   PSA 1.92 03/02/2013   HGBA1C 5.0 03/02/2013    Dg Chest 2 View  05/16/2014  CLINICAL DATA:  Multiple splenic and liver lesions suspicious for sarcoid or lymphoma EXAM: CHEST  2 VIEW COMPARISON:  CT abdomen pelvis of 03/26/2014 and chest x-ray of 08/14/2009 FINDINGS: No active infiltrate or effusion is seen. No mediastinal or hilar adenopathy is noted. The heart is within normal limits in size. No bony abnormality is seen. IMPRESSION: No active cardiopulmonary disease. No mediastinal or hilar adenopathy is noted. Electronically Signed   By: Ivar Drape M.D.   On: 05/16/2014 09:45    Assessment & Plan:     Problem List Items Addressed This Visit    Marginal zone lymphoma of lymph nodes of head, face, and neck (H[facility])    Involving both orbits and spleen, diagnosed  left orbital mass biopsy June 12 at [facility].  S/p splenectomy, chemo /XRT .  Has had multiple complications including splenic vein thrombosis requiring Lovenox for 6 months,  Angioedema post XRT requiring intubation, and SB delayed emptying (stricture?) by recent GI workup.  Concern for spread given bilateral orbital swelling,  MRi planned.        Post-nasal drip    Advised to try adding twice daily sinus rinses using NeilMed.       Scrotal edema - Primary    Mild,  Accounting for the penile "shrinkage." suspect protein stores are donw givne weight loss.  Lasix trial.  Increase protein intake.         A total of 40 minutes was spent with patient more than half of which was spent in counseling patient on the above mentioned issues , reviewing and explaining recent labs and imaging studies done, and coordination of care.  I am having Mr. Ludeman start on furosemide. I am also having him maintain his hyoscyamine, montelukast, fexofenadine, mometasone, carvedilol, ondansetron, esomeprazole, famotidine-calcium carbonate-magnesium hydroxide, oxycodone-acetaminophen, potassium chloride SA, LORazepam, prochlorperazine, Melatonin, amitriptyline, ipratropium, losartan-hydrochlorothiazide, and zolpidem.  Meds ordered this encounter  Medications  . furosemide (LASIX) 20 MG tablet    Sig: Take 1 tablet (20 mg total) by mouth daily.    Dispense:  30 tablet    Refill:  3    There are no discontinued medications.  Follow-up: No Follow-up on file.   [name], MD

## 2016-04-05 NOTE — Patient Instructions (Addendum)
I am prescribing a fluid pill called furosemide 20 mg .  Use it once daily as needed for fluid retention.  Take a potassium pill  every day that you take the furosemide    Switch soaps to Lever 2000 or Dial   Try flushing your sinuses twice daily with NeilMed's sinus rinse  Your fluid retention may be due to low protein stores.   You might want to try a premixed protein drink called Premier Protein shake  Instead of skipping a meal when you don't feel like eating    160 cal  30 g protein  1 g sugar 50% calcium needs

## 2016-04-05 NOTE — Progress Notes (Signed)
Pre visit review using our clinic review tool, if applicable. No additional management support is needed unless otherwise documented below in the visit note. 

## 2016-04-06 DIAGNOSIS — N5089 Other specified disorders of the male genital organs: Secondary | ICD-10-CM | POA: Insufficient documentation

## 2016-04-06 NOTE — Assessment & Plan Note (Signed)
Advised to try adding twice daily sinus rinses using NeilMed.

## 2016-04-06 NOTE — Assessment & Plan Note (Signed)
Involving both orbits and spleen, diagnosed  left orbital mass biopsy June 12 at [facility].  S/p splenectomy, chemo /XRT .  Has had multiple complications including splenic vein thrombosis requiring Lovenox for 6 months,  Angioedema post XRT requiring intubation, and SB delayed emptying (stricture?) by recent GI workup.  Concern for spread given bilateral orbital swelling,  MRi planned.

## 2016-04-06 NOTE — Assessment & Plan Note (Signed)
Mild,  Accounting for the penile "shrinkage." suspect protein stores are donw givne weight loss.  Lasix trial.  Increase protein intake.

## 2016-04-11 ENCOUNTER — Encounter: Payer: Self-pay | Admitting: Internal Medicine

## 2016-05-24 ENCOUNTER — Other Ambulatory Visit: Payer: Self-pay | Admitting: Internal Medicine

## 2016-05-25 NOTE — Telephone Encounter (Signed)
refilled 

## 2016-05-25 NOTE — Telephone Encounter (Signed)
Last OV 04/05/16 ok to fill Ambien?

## 2016-07-26 ENCOUNTER — Other Ambulatory Visit: Payer: Self-pay | Admitting: Internal Medicine

## 2016-08-04 ENCOUNTER — Other Ambulatory Visit: Payer: Self-pay | Admitting: Internal Medicine

## 2016-09-03 LAB — HM COLONOSCOPY

## 2016-09-07 ENCOUNTER — Telehealth: Payer: Self-pay | Admitting: Internal Medicine

## 2016-09-07 ENCOUNTER — Telehealth: Payer: Self-pay

## 2016-09-07 NOTE — Telephone Encounter (Signed)
Patients wife was transferred from [facility] to schedule appointment to see PCP since patient refused to go to ER/Urgent care.  Patient was scheduled for [id number] @ 1800 with Dr. Derrel Nip.

## 2016-09-07 NOTE — Telephone Encounter (Signed)
Patient Name: Brian Atkins DOB: April 17, 1956 Initial Comment Caller states husband is leaving [facility] right now, was seeing cancer MD. BP 170/110. Nurse Assessment Nurse: Vallery Sa, RN, [place] Date/Time (Eastern Time): 09/07/2016 2:39:32 PM Confirm and document reason for call. If symptomatic, describe symptoms. You must click the next button to save text entered. ---Caller states Shamarr's blood pressure was 170/110 about two hours ago. No breathing difficulty. No chest pain. He's had a headache for the past week (rated as a 4 on the 1 to 10 scale). No trauma in the past week. Alert and responsive. Has the patient traveled out of the country within the last 30 days? ---No Does the patient have any new or worsening symptoms? ---Yes Will a triage be completed? ---Yes Related visit to physician within the last 2 weeks? ---Yes Does the PT have any chronic conditions? (i.e. diabetes, asthma, etc.) ---Yes List chronic conditions. ---Lymphoma, High Blood Pressure Is this a behavioral health or substance abuse call? 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 She shares that he will not go to an ER. Called the office backline and notified Fransisco Beau. Connected Fransisco Beau and Vicente Males together. Referrals GO TO FACILITY OTHER - SPECIFY Disagree/Comply: Disagree Disagree/Comply Reason: Disagree with instructions

## 2016-09-09 LAB — HM COLONOSCOPY

## 2016-09-13 ENCOUNTER — Ambulatory Visit: Payer: BLUE CROSS/BLUE SHIELD | Admitting: Family

## 2016-09-13 ENCOUNTER — Ambulatory Visit (INDEPENDENT_AMBULATORY_CARE_PROVIDER_SITE_OTHER): Payer: BLUE CROSS/BLUE SHIELD | Admitting: Internal Medicine

## 2016-09-13 ENCOUNTER — Encounter: Payer: Self-pay | Admitting: Internal Medicine

## 2016-09-13 VITALS — BP 124/82 | HR 73 | Temp 97.4°F | Resp 14 | Wt 197.0 lb

## 2016-09-13 DIAGNOSIS — I10 Essential (primary) hypertension: Secondary | ICD-10-CM | POA: Diagnosis not present

## 2016-09-13 DIAGNOSIS — R0681 Apnea, not elsewhere classified: Secondary | ICD-10-CM | POA: Diagnosis not present

## 2016-09-13 DIAGNOSIS — R0683 Snoring: Secondary | ICD-10-CM

## 2016-09-13 DIAGNOSIS — Z23 Encounter for immunization: Secondary | ICD-10-CM

## 2016-09-13 DIAGNOSIS — R5382 Chronic fatigue, unspecified: Secondary | ICD-10-CM

## 2016-09-13 DIAGNOSIS — R1031 Right lower quadrant pain: Secondary | ICD-10-CM

## 2016-09-13 DIAGNOSIS — G4730 Sleep apnea, unspecified: Secondary | ICD-10-CM

## 2016-09-13 MED ORDER — DICYCLOMINE HCL 10 MG PO CAPS: 10.0000 mg | ORAL_CAPSULE | Freq: Three times a day (TID) | ORAL | 1 refills | Status: DC

## 2016-09-13 MED ORDER — TRAZODONE HCL 50 MG PO TABS: 25.0000 mg | ORAL_TABLET | Freq: Every evening | ORAL | 3 refills | Status: DC | PRN

## 2016-09-13 MED ORDER — CARVEDILOL 3.125 MG PO TABS: 3.1250 mg | ORAL_TABLET | Freq: Two times a day (BID) | ORAL | 3 refills | Status: DC

## 2016-09-13 MED ORDER — ONDANSETRON HCL 8 MG PO TABS: 8.0000 mg | ORAL_TABLET | Freq: Every day | ORAL | 1 refills | Status: DC | PRN

## 2016-09-13 NOTE — Progress Notes (Signed)
Pre visit review using our clinic review tool, if applicable. No additional management support is needed unless otherwise documented below in the visit note. 

## 2016-09-13 NOTE — Patient Instructions (Addendum)
We are resuming Coreg at a lower dose (3.125 mg) for your BP > 140/90  I am ordering a sleep study to rule out sleep apnea  Trial of trazodone for insomnia .  Take 1 tablet 1 hour before you want to be asleep.  If still wide awake at bedtime , you can take 1/2 ambien    Trial of dicyclomine , an antispasmodic, used to treat IBS.  Take 1-2 tablets (20 mg total dose) 30 minutes prior to each meal

## 2016-09-13 NOTE — Progress Notes (Signed)
Subjective:  Patient ID: Brian Atkins, male    DOB: [birth year]  Age: 60 y.o. MRN: [id number]  CC: The primary encounter diagnosis was Snoring. Diagnoses of Witnessed apneic spells, Chronic fatigue, Encounter for immunization, Essential hypertension, Sleep apnea, and Right lower quadrant abdominal pain were also pertinent to this visit.  HPI Brian Atkins presents for follow up on multiple issues.  He is accompanied by his wife.    1)  elevated BP readings. Has had several high readings during recent office visits with ID, Oncology. Office notes from [place] were accessed before and during visit with patient via [facility] portal.  Initial BP during a recent OV was 170/111,  Then 150/110 at ID clinic sept 19, 152/81 same day  134/87 in June at oncology .  He has been checking BP 3 times daily at home for the past week: BPs have been trending down since then.   Past history of hypertension controlled with coreg 6.25 mg bid .     He denies increased anxiety,  But his demeanor suggests otherwise.   Has lost 7 lbs since last visit in April  Still has post prandial nausea and burning in the right inguinal area  That occurs immediately after eating.  Recent GI study shows slow transit . Had 2 bariums swallows.  EGD and colonoscopy done recently  .   Wife has noted apneic spells and snoring with no prior sleep study.  Feels fatigued constantly.     Outpatient Medications Prior to Visit  Medication Sig Dispense Refill  . esomeprazole (NEXIUM) 40 MG capsule Take 40 mg by mouth daily at 12 noon.    . famotidine-calcium carbonate-magnesium hydroxide (PEPCID COMPLETE) 10-800-165 MG CHEW chewable tablet Chew 1 tablet by mouth 4 (four) times daily.    . fexofenadine (ALLEGRA) 180 MG tablet Take 180 mg by mouth daily.    . furosemide (LASIX) 20 MG tablet TAKE 1 TABLET (20 MG TOTAL) BY MOUTH DAILY. 30 tablet 3  . LORazepam (ATIVAN) 1 MG tablet Take 1 tablet (1 mg total) by mouth every 8 (eight) hours as  needed. 30 tablet 3  . losartan-hydrochlorothiazide (HYZAAR) 100-25 MG tablet TAKE ONE TABLET BY MOUTH ONE TIME DAILY 30 tablet 4  . Melatonin 3 MG TABS Take 1 tablet by mouth at bedtime.     . mometasone (NASONEX) 50 MCG/ACT nasal spray Place 2 sprays into the nose daily.    . montelukast (SINGULAIR) 10 MG tablet Take 1 tablet by mouth daily at 8 pm.    . potassium chloride SA (K-DUR,KLOR-CON) 20 MEQ tablet Take 1 tablet (20 mEq total) by mouth daily. 30 tablet 3  . zolpidem (AMBIEN) 10 MG tablet TAKE 1 TABLET BY MOUTH AT BEDTIME 30 tablet 5  . ondansetron (ZOFRAN) 8 MG tablet Take 8 mg by mouth every 8 (eight) hours as needed. for nausea  3  . prochlorperazine (COMPAZINE) 5 MG tablet Take 1 tablet (5 mg total) by mouth every 6 (six) hours as needed. 30 tablet 3  . ipratropium (ATROVENT) 0.06 % nasal spray Place 2 sprays into both nostrils 4 (four) times daily. (Patient not taking: Reported on 09/13/2016) 15 mL 12  . amitriptyline (ELAVIL) 50 MG tablet Take 1 tablet (50 mg total) by mouth at bedtime. (Patient not taking: Reported on 09/13/2016) 30 tablet 2  . carvedilol (COREG) 6.25 MG tablet TAKE ONE TABLET BY MOUTH TWICE DAILY (Patient not taking: Reported on 09/13/2016) 180 tablet 1  . hyoscyamine (LEVSIN SL)  0.125 MG SL tablet Place 1 tablet (0.125 mg total) under the tongue every 4 (four) hours as needed for cramping. 30 tablet 0  . oxycodone-acetaminophen (PERCOCET) 2.5-325 MG per tablet Take 1 tablet by mouth every 6 (six) hours as needed for pain. (Patient not taking: Reported on 09/13/2016) 20 tablet 0   No facility-administered medications prior to visit.     Review of Systems;  Patient denies headache, fevers, malaise, unintentional weight loss, skin rash, eye pain, sinus congestion and sinus pain, sore throat, dysphagia,  hemoptysis , cough, dyspnea, wheezing, chest pain, palpitations, orthopnea, edema, abdominal pain, nausea, melena, diarrhea, constipation, flank pain, dysuria,  hematuria, urinary  Frequency, nocturia, numbness, tingling, seizures,  Focal weakness, Loss of consciousness,  Tremor, insomnia, depression, anxiety, and suicidal ideation.      Objective:  BP 124/82   Pulse 73   Temp 97.4 F (36.3 C) (Oral)   Resp 14   Wt 197 lb (89.4 kg)   SpO2 94%   BMI 25.29 kg/m   BP Readings from Last 3 Encounters:  09/13/16 124/82  04/05/16 128/88  09/29/15 128/70    Wt Readings from Last 3 Encounters:  09/13/16 197 lb (89.4 kg)  04/05/16 204 lb 6.4 oz (92.7 kg)  09/29/15 206 lb 12 oz (93.8 kg)    General appearance: alert, cooperative and appears stated age Ears: normal TM's and external ear canals both ears Throat: lips, mucosa, and tongue normal; teeth and gums normal Neck: no adenopathy, no carotid bruit, supple, symmetrical, trachea midline and thyroid not enlarged, symmetric, no tenderness/mass/nodules Back: symmetric, no curvature. ROM normal. No CVA tenderness. Lungs: clear to auscultation bilaterally Heart: regular rate and rhythm, S1, S2 normal, no murmur, click, rub or gallop Abdomen: soft, non-tender; bowel sounds normal; no masses,  no organomegaly Pulses: 2+ and symmetric Skin: Skin color, texture, turgor normal. No rashes or lesions Lymph nodes: Cervical, supraclavicular, and axillary nodes normal.  Lab Results  Component Value Date   HGBA1C 5.0 03/02/2013   HGBA1C 5.0 10/25/2011    Lab Results  Component Value Date   CREATININE 0.9 04/01/2015   CREATININE 1.0 03/19/2015   CREATININE 1.3 05/16/2014    Lab Results  Component Value Date   WBC 6.7 04/01/2015   HGB 13.4 (A) 04/01/2015   HCT 40 (A) 04/01/2015   PLT 494 (A) 04/01/2015   GLUCOSE 90 05/16/2014   CHOL 212 (H) 03/02/2013   TRIG 168.0 (H) 03/02/2013   HDL 27.70 (L) 03/02/2013   LDLDIRECT 135.8 03/02/2013   LDLCALC 83 10/25/2011   ALT 52 (A) 04/01/2015   AST 30 04/01/2015   NA 138 04/01/2015   K 3.4 04/01/2015   CL 107 05/16/2014   CREATININE 0.9  04/01/2015   BUN 11 04/01/2015   CO2 28 05/16/2014   TSH 3.18 09/13/2016   PSA 1.92 03/02/2013   HGBA1C 5.0 03/02/2013    Dg Chest 2 View  Result Date: 05/16/2014 CLINICAL DATA:  Multiple splenic and liver lesions suspicious for sarcoid or lymphoma EXAM: CHEST  2 VIEW COMPARISON:  CT abdomen pelvis of 03/26/2014 and chest x-ray of 08/14/2009 FINDINGS: No active infiltrate or effusion is seen. No mediastinal or hilar adenopathy is noted. The heart is within normal limits in size. No bony abnormality is seen. IMPRESSION: No active cardiopulmonary disease. No mediastinal or hilar adenopathy is noted. Electronically Signed   By: Ivar Drape M.D.   On: 05/16/2014 09:45    Assessment & Plan:   Problem List Items Addressed  This Visit    Essential hypertension    Elevations appear to be transient and likely related to unacknowledged anxiety. Resuming carvedilol at 3.125 mg bid      Relevant Medications   carvedilol (COREG) 3.125 MG tablet   Sleep apnea    Untreated since diagnosis in 2012.  Advised to repeat study given constant fatigue and witnessed apneic events.       Abdominal pain    No treatment offered by GI.  Discussed empiric treatment for IBS with dicyclomine pre meal.       Other Visit Diagnoses    Snoring    -  Primary   Relevant Orders   Nocturnal polysomnography   Witnessed apneic spells       Relevant Orders   Nocturnal polysomnography   Chronic fatigue       Relevant Orders   TSH (Completed)   B12 (Completed)   Folate RBC   Encounter for immunization       Relevant Orders   Flu Vaccine QUAD 36+ mos IM (Completed)      I have discontinued Mr. Bogdanowicz hyoscyamine, carvedilol, oxycodone-acetaminophen, prochlorperazine, and amitriptyline. I have also changed his ondansetron. Additionally, I am having him start on carvedilol, dicyclomine, and traZODone. Lastly, I am having him maintain his montelukast, fexofenadine, mometasone, esomeprazole, famotidine-calcium  carbonate-magnesium hydroxide, potassium chloride SA, LORazepam, Melatonin, ipratropium, zolpidem, losartan-hydrochlorothiazide, and furosemide.  Meds ordered this encounter  Medications  . carvedilol (COREG) 3.125 MG tablet    Sig: Take 1 tablet (3.125 mg total) by mouth 2 (two) times daily with a meal.    Dispense:  180 tablet    Refill:  3  . dicyclomine (BENTYL) 10 MG capsule    Sig: Take 1 capsule (10 mg total) by mouth 4 (four) times daily -  before meals and at bedtime.    Dispense:  360 capsule    Refill:  1  . ondansetron (ZOFRAN) 8 MG tablet    Sig: Take 1 tablet (8 mg total) by mouth daily as needed. for nausea    Dispense:  90 tablet    Refill:  1  . traZODone (DESYREL) 50 MG tablet    Sig: Take 0.5-1 tablets (25-50 mg total) by mouth at bedtime as needed for sleep.    Dispense:  30 tablet    Refill:  3    Medications Discontinued During This Encounter  Medication Reason  . carvedilol (COREG) 6.25 MG tablet   . amitriptyline (ELAVIL) 50 MG tablet   . hyoscyamine (LEVSIN SL) 0.125 MG SL tablet   . prochlorperazine (COMPAZINE) 5 MG tablet   . ondansetron (ZOFRAN) 8 MG tablet Reorder  . oxycodone-acetaminophen (PERCOCET) 2.5-325 MG per tablet     Follow-up: Return in about 3 months (around 12/13/2016).   [name], MD

## 2016-09-14 DIAGNOSIS — Z23 Encounter for immunization: Secondary | ICD-10-CM | POA: Diagnosis not present

## 2016-09-14 LAB — TSH: TSH: 3.18 u[IU]/mL (ref 0.35–4.50)

## 2016-09-14 LAB — VITAMIN B12: Vitamin B-12: 217 pg/mL (ref 211–911)

## 2016-09-14 NOTE — Assessment & Plan Note (Signed)
Untreated since diagnosis in 2012.  Advised to repeat study given constant fatigue and witnessed apneic events.

## 2016-09-14 NOTE — Assessment & Plan Note (Signed)
No treatment offered by GI.  Discussed empiric treatment for IBS with dicyclomine pre meal.

## 2016-09-14 NOTE — Assessment & Plan Note (Signed)
Elevations appear to be transient and likely related to unacknowledged anxiety. Resuming carvedilol at 3.125 mg bid

## 2016-09-15 LAB — FOLATE RBC: RBC FOLATE: 311 ng/mL (ref >280)

## 2016-09-16 ENCOUNTER — Encounter: Payer: Self-pay | Admitting: Internal Medicine

## 2016-09-16 ENCOUNTER — Other Ambulatory Visit: Payer: Self-pay | Admitting: Internal Medicine

## 2016-09-16 ENCOUNTER — Telehealth: Payer: Self-pay | Admitting: *Deleted

## 2016-09-16 MED ORDER — SYRINGE (DISPOSABLE) 3 ML MISC: 0 refills | Status: DC

## 2016-09-16 MED ORDER — CYANOCOBALAMIN 1000 MCG/ML IJ SOLN: INTRAMUSCULAR | 0 refills | Status: DC

## 2016-09-16 NOTE — Telephone Encounter (Signed)
Pharmacy called and given clarified instructions.

## 2016-09-16 NOTE — Telephone Encounter (Signed)
The instructions state take the injection for 4 days then weekly for 4 weeks. Is that correct?

## 2016-09-16 NOTE — Telephone Encounter (Signed)
[facility] requested clarification on the Rx for B12  Contact [phone]

## 2016-09-16 NOTE — Telephone Encounter (Signed)
Once per day for 4 days and then once weekly for 4 weeks.

## 2016-09-20 ENCOUNTER — Other Ambulatory Visit: Payer: Self-pay | Admitting: Internal Medicine

## 2016-09-21 ENCOUNTER — Encounter: Payer: Self-pay | Admitting: Internal Medicine

## 2016-09-22 ENCOUNTER — Encounter: Payer: Self-pay | Admitting: Internal Medicine

## 2016-09-22 MED ORDER — FUROSEMIDE 20 MG PO TABS: 20.0000 mg | ORAL_TABLET | Freq: Every day | ORAL | 3 refills | Status: DC

## 2016-12-07 ENCOUNTER — Telehealth: Payer: Self-pay | Admitting: Internal Medicine

## 2016-12-07 MED ORDER — ZOLPIDEM TARTRATE 10 MG PO TABS: 10.0000 mg | ORAL_TABLET | Freq: Every day | ORAL | 5 refills | Status: DC

## 2016-12-07 NOTE — Telephone Encounter (Signed)
ambien refill authorized and printed

## 2016-12-07 NOTE — Telephone Encounter (Signed)
Patient tried the trazodone and stated the Ambien works better ok to  Use Ambien? Patient will need script if ok to refill?

## 2016-12-07 NOTE — Telephone Encounter (Signed)
Script placed in quick sign for MD signature.

## 2016-12-07 NOTE — Telephone Encounter (Signed)
Script faxed.

## 2016-12-07 NOTE — Telephone Encounter (Signed)
Pt called and stated that the zolpidem (AMBIEN) 10 MG tablet works best for him, and was wondering if we could call that it. Please advise, thank you!  Call pt @ [phone]  Pharmacy - [id number] - [place], [place]

## 2016-12-25 ENCOUNTER — Other Ambulatory Visit: Payer: Self-pay | Admitting: Internal Medicine

## 2017-02-10 ENCOUNTER — Other Ambulatory Visit: Payer: Self-pay | Admitting: Internal Medicine

## 2017-02-16 ENCOUNTER — Telehealth: Payer: Self-pay | Admitting: Internal Medicine

## 2017-02-16 ENCOUNTER — Encounter: Payer: Self-pay | Admitting: Internal Medicine

## 2017-02-16 DIAGNOSIS — R110 Nausea: Secondary | ICD-10-CM

## 2017-02-16 DIAGNOSIS — R5383 Other fatigue: Secondary | ICD-10-CM

## 2017-02-16 NOTE — Telephone Encounter (Signed)
Reason for call: Symptoms: no energy, no appetite, no fever, chills, sweats,  Left side lower stomach pain, pale in color, recent travel to [place] on air plane, nauseated all the time, fatigue, not eating well.  Food intake not well. Not staying hydrated.  History lymphoma ,angioedema Duration 2 weeks but pain worsening   Medications: Ibuprofen as needed Only wants to see you , Spoke with wife Please advise.

## 2017-02-16 NOTE — Telephone Encounter (Signed)
Pt spouse called and stated that pt is not feeling well, pt has lymphoma. The best way she can describe the way he is feeling is that it is like when he was on chemo but he is not on chemo. He is c/o rundown, no energy, no appetite, deeper than a stomach ache. Pt is looking for an appt with Dr. Derrel Nip. Please advise, thank you!  Call pt  @ [phone]

## 2017-02-16 NOTE — Telephone Encounter (Signed)
Left detailed message for patient to call tomorrow morning and advise of appt. For 02/18/17 at 4:30 and to come early for labs.

## 2017-02-16 NOTE — Telephone Encounter (Signed)
The only appt I have this week is 4:30 on Friday.  He will need to come early so he can get labs done PRIOR TO THE OFFICE VISIT

## 2017-02-17 ENCOUNTER — Ambulatory Visit: Payer: BLUE CROSS/BLUE SHIELD | Admitting: Internal Medicine

## 2017-02-17 NOTE — Telephone Encounter (Signed)
Patient has been scheduled earliest he can come in for the labs is around 4 pm and then appt has advised with PCP at 4.30 on Friday. FYI

## 2017-02-18 ENCOUNTER — Encounter: Payer: Self-pay | Admitting: Internal Medicine

## 2017-02-18 ENCOUNTER — Ambulatory Visit (INDEPENDENT_AMBULATORY_CARE_PROVIDER_SITE_OTHER): Payer: BLUE CROSS/BLUE SHIELD | Admitting: Internal Medicine

## 2017-02-18 ENCOUNTER — Ambulatory Visit (INDEPENDENT_AMBULATORY_CARE_PROVIDER_SITE_OTHER): Payer: BLUE CROSS/BLUE SHIELD

## 2017-02-18 ENCOUNTER — Other Ambulatory Visit: Payer: Self-pay | Admitting: Internal Medicine

## 2017-02-18 VITALS — BP 120/90 | HR 77 | Temp 98.7°F | Resp 16 | Wt 195.0 lb

## 2017-02-18 DIAGNOSIS — I10 Essential (primary) hypertension: Secondary | ICD-10-CM | POA: Diagnosis not present

## 2017-02-18 DIAGNOSIS — R1031 Right lower quadrant pain: Secondary | ICD-10-CM | POA: Diagnosis not present

## 2017-02-18 DIAGNOSIS — G4733 Obstructive sleep apnea (adult) (pediatric): Secondary | ICD-10-CM

## 2017-02-18 DIAGNOSIS — R110 Nausea: Secondary | ICD-10-CM | POA: Diagnosis not present

## 2017-02-18 DIAGNOSIS — K5900 Constipation, unspecified: Secondary | ICD-10-CM

## 2017-02-18 DIAGNOSIS — R5383 Other fatigue: Secondary | ICD-10-CM | POA: Diagnosis not present

## 2017-02-18 LAB — CBC WITH DIFFERENTIAL/PLATELET
BASOS ABS: 0 {cells}/uL (ref 0–200)
Basophils Relative: 0 %
Eosinophils Absolute: 279 cells/uL (ref 15–500)
Eosinophils Relative: 3 %
HEMATOCRIT: 46.5 % (ref 38.5–50.0)
HEMOGLOBIN: 15.7 g/dL (ref 13.2–17.1)
LYMPHS ABS: 3534 {cells}/uL (ref 850–3900)
Lymphocytes Relative: 38 %
MCH: 30.3 pg (ref 27.0–33.0)
MCHC: 33.8 g/dL (ref 32.0–36.0)
MCV: 89.6 fL (ref 80.0–100.0)
MONO ABS: 1116 {cells}/uL — AB (ref 200–950)
MPV: 9.2 fL (ref 7.5–12.5)
Monocytes Relative: 12 %
NEUTROS PCT: 47 %
Neutro Abs: 4371 cells/uL (ref 1500–7800)
Platelets: 380 10*3/uL (ref 140–400)
RBC: 5.19 MIL/uL (ref 4.20–5.80)
RDW: 14.7 % (ref 11.0–15.0)
WBC: 9.3 10*3/uL (ref 3.8–10.8)

## 2017-02-18 LAB — VITAMIN B12: Vitamin B-12: 456 pg/mL (ref 200–1100)

## 2017-02-18 LAB — COMPREHENSIVE METABOLIC PANEL
ALBUMIN: 4 g/dL (ref 3.6–5.1)
ALT: 13 U/L (ref 9–46)
AST: 16 U/L (ref 10–35)
Alkaline Phosphatase: 63 U/L (ref 40–115)
BUN: 19 mg/dL (ref 7–25)
CALCIUM: 9.6 mg/dL (ref 8.6–10.3)
CHLORIDE: 106 mmol/L (ref 98–110)
CO2: 29 mmol/L (ref 20–31)
CREATININE: 1.29 mg/dL — AB (ref 0.70–1.25)
GLUCOSE: 83 mg/dL (ref 65–99)
Potassium: 4.5 mmol/L (ref 3.5–5.3)
SODIUM: 143 mmol/L (ref 135–146)
Total Bilirubin: 0.4 mg/dL (ref 0.2–1.2)
Total Protein: 6.6 g/dL (ref 6.1–8.1)

## 2017-02-18 LAB — TSH: TSH: 2.23 m[IU]/L (ref 0.40–4.50)

## 2017-02-18 MED ORDER — LACTULOSE 20 GM/30ML PO SOLN: ORAL | 3 refills | Status: DC

## 2017-02-18 MED ORDER — TRIAMCINOLONE ACETONIDE 0.1 % EX CREA: 1.0000 "application " | TOPICAL_CREAM | Freq: Two times a day (BID) | CUTANEOUS | 0 refills | Status: DC

## 2017-02-18 MED ORDER — ONDANSETRON HCL 8 MG PO TABS: 8.0000 mg | ORAL_TABLET | Freq: Every day | ORAL | 1 refills | Status: DC | PRN

## 2017-02-18 NOTE — Progress Notes (Signed)
Pre visit review using our clinic review tool, if applicable. No additional management support is needed unless otherwise documented below in the visit note. 

## 2017-02-18 NOTE — Progress Notes (Signed)
Subjective:  Patient ID: Brian Atkins, male    DOB: 07-04-1956  Age: 61 y.o. MRN: [id number]  CC: The primary encounter diagnosis was Constipation, unspecified constipation type. Diagnoses of Nausea, Fatigue, unspecified type, Right lower quadrant abdominal pain, Essential hypertension, and Obstructive sleep apnea syndrome were also pertinent to this visit.  HPI Brian Atkins presents for  gi symptoms:  2 weesk of nausea,  Anorexia,  Lack of appetite . BLOATED,  Persistent RLW discomfort.   Last bm was Tuesday,  Small "rocks"    Last week during flight to [place]  became acutey nauseated when he tried to drink carbonated beverages to setttle his stomach. .  Has been taking senot extra for the past weeks with very little output.  decreased flatulence as well for the past week ,  No energy,  Not sleeping well.    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 Normal colonoscopy an normla abd CT last September   Had normal labs in Jan 2018  History of angioedema with low complement levels.  Last checked 3/.2017   9 lb wt loss since April when he presented with edema . Has not used furosemide in weeks   Outpatient Medications Prior to Visit  Medication Sig Dispense Refill  . carvedilol (COREG) 3.125 MG tablet Take 1 tablet (3.125 mg total) by mouth 2 (two) times daily with a meal. 180 tablet 3  . cyanocobalamin (,VITAMIN B-12,) 1000 MCG/ML injection 1 ml injected intramuscularly  For 4 days,  Then weekly for 4 weeks 10 mL 0  . dicyclomine (BENTYL) 10 MG capsule Take 1 capsule (10 mg total) by mouth 4 (four) times daily -  before meals and at bedtime. 360 capsule 1  . esomeprazole (NEXIUM) 40 MG capsule Take 40 mg by mouth daily at 12 noon.    . famotidine-calcium carbonate-magnesium hydroxide (PEPCID COMPLETE) 10-800-165 MG CHEW chewable tablet Chew 1 tablet by mouth 4 (four) times daily.    . fexofenadine (ALLEGRA) 180 MG  tablet Take 180 mg by mouth daily.    . furosemide (LASIX) 20 MG tablet TAKE 1 TABLET (20 MG TOTAL) BY MOUTH DAILY. 30 tablet 3  . ipratropium (ATROVENT) 0.06 % nasal spray Place 2 sprays into both nostrils 4 (four) times daily. 15 mL 12  . LORazepam (ATIVAN) 1 MG tablet Take 1 tablet (1 mg total) by mouth every 8 (eight) hours as needed. 30 tablet 3  . Melatonin 3 MG TABS Take 1 tablet by mouth at bedtime.     . mometasone (NASONEX) 50 MCG/ACT nasal spray Place 2 sprays into the nose daily.    . montelukast (SINGULAIR) 10 MG tablet Take 1 tablet by mouth daily at 8 pm.    . potassium chloride SA (K-DUR,KLOR-CON) 20 MEQ tablet Take 1 tablet (20 mEq total) by mouth daily. 30 tablet 3  . Syringe, Disposable, 3 ML MISC For use with B12 injections weekly/monthly 25 each 0  . traZODone (DESYREL) 50 MG tablet Take 0.5-1 tablets (25-50 mg total) by mouth at bedtime as needed for sleep. 30 tablet 3  . zolpidem (AMBIEN) 10 MG tablet Take 1 tablet (10 mg total) by mouth at bedtime. 30 tablet 5  . losartan-hydrochlorothiazide (HYZAAR) 100-25 MG tablet TAKE ONE TABLET BY MOUTH ONE TIME DAILY 30 tablet 3  . ondansetron (ZOFRAN) 8 MG tablet Take 1 tablet (8 mg total) by mouth daily as needed.  for nausea 90 tablet 1   No facility-administered medications prior to visit.     Review of Systems;  Patient denies headache, fevers, , unintentional weight loss, skin rash, eye pain, sinus congestion and sinus pain, sore throat, dysphagia,  hemoptysis , cough, dyspnea, wheezing, chest pain, palpitations, orthopnea, edema, a melena, diarrhea,  flank pain, dysuria, hematuria, urinary  Frequency, nocturia, numbness, tingling, seizures,  Focal weakness, Loss of consciousness,  Tremor, insomnia, depression, anxiety, and suicidal ideation.      Objective:  BP 120/90   Pulse 77   Temp 98.7 F (37.1 C) (Oral)   Resp 16   Wt 195 lb (88.5 kg)   SpO2 94%   BMI 25.04 kg/m   BP Readings from Last 3 Encounters:    02/18/17 120/90  09/13/16 124/82  04/05/16 128/88    Wt Readings from Last 3 Encounters:  02/18/17 195 lb (88.5 kg)  09/13/16 197 lb (89.4 kg)  04/05/16 204 lb 6.4 oz (92.7 kg)    General appearance: alert, cooperative and appears stated age Ears: normal TM's and external ear canals both ears Throat: lips, mucosa, and tongue normal; teeth and gums normal Neck: no adenopathy, no carotid bruit, supple, symmetrical, trachea midline and thyroid not enlarged, symmetric, no tenderness/mass/nodules Back: symmetric, no curvature. ROM normal. No CVA tenderness. Lungs: clear to auscultation bilaterally Heart: regular rate and rhythm, S1, S2 normal, no murmur, click, rub or gallop Abdomen: soft, non-tender; bowel sounds normal; no masses,  no organomegaly Pulses: 2+ and symmetric Skin: Skin color, texture, turgor normal. No rashes or lesions Lymph nodes: Cervical, supraclavicular, and axillary nodes normal.  Lab Results  Component Value Date   HGBA1C 5.0 03/02/2013   HGBA1C 5.0 10/25/2011    Lab Results  Component Value Date   CREATININE 1.29 (H) 02/18/2017   CREATININE 0.9 04/01/2015   CREATININE 1.0 03/19/2015    Lab Results  Component Value Date   WBC 9.3 02/18/2017   HGB 15.7 02/18/2017   HCT 46.5 02/18/2017   PLT 380 02/18/2017   GLUCOSE 83 02/18/2017   CHOL 212 (H) 03/02/2013   TRIG 168.0 (H) 03/02/2013   HDL 27.70 (L) 03/02/2013   LDLDIRECT 135.8 03/02/2013   LDLCALC 83 10/25/2011   ALT 13 02/18/2017   AST 16 02/18/2017   NA 143 02/18/2017   K 4.5 02/18/2017   CL 106 02/18/2017   CREATININE 1.29 (H) 02/18/2017   BUN 19 02/18/2017   CO2 29 02/18/2017   TSH 2.23 02/18/2017   PSA 1.92 03/02/2013   HGBA1C 5.0 03/02/2013    Dg Chest 2 View  Result Date: 05/16/2014 CLINICAL DATA:  Multiple splenic and liver lesions suspicious for sarcoid or lymphoma EXAM: CHEST  2 VIEW COMPARISON:  CT abdomen pelvis of 03/26/2014 and chest x-ray of 08/14/2009 FINDINGS: No active  infiltrate or effusion is seen. No mediastinal or hilar adenopathy is noted. The heart is within normal limits in size. No bony abnormality is seen. IMPRESSION: No active cardiopulmonary disease. No mediastinal or hilar adenopathy is noted. Electronically Signed   By: Ivar Drape M.D.   On: 05/16/2014 09:45    Assessment & Plan:   Problem List Items Addressed This Visit    Abdominal pain    Recurrent rlq pain likely secondary to IBS/constipation , given normal abd ct and colonoscopy      Constipation - Primary    Normal bowel gas pattern on plains films, which confirm constipation without obstruction.  Lactulose prn,  linzess trial.  Essential hypertension    Stopping hctz due to decreased gfr,  Continue losartan 100 mg daily       Relevant Medications   losartan (COZAAR) 100 MG tablet   Fatigue    Screening labs normal.  Has  Untreated OSA .  Recommended participating in regular exercise program with goal of achieving a minimum of 30 minutes of aerobic activity 5 days per week. If no improvement,  needs repeat sleep study   Lab Results  Component Value Date   CREATININE 0.79 01/19/2016   Lab Results  Component Value Date   ALT 14 01/19/2016   AST 18 01/19/2016   ALKPHOS 69 01/19/2016   BILITOT 0.6 01/19/2016   Lab Results  Component Value Date   TSH 1.68 01/19/2016   Lab Results  Component Value Date   WBC 8.6 01/19/2016   HGB 14.7 01/19/2016   HCT 43.8 01/19/2016   MCV 92.7 01/19/2016   PLT 248.0 01/19/2016        Sleep apnea    Untreated since diagnosis .         Other Visit Diagnoses    Nausea          I have discontinued Mr. Olivio losartan-hydrochlorothiazide. I am also having him start on triamcinolone cream, Lactulose, and losartan. Additionally, I am having him maintain his montelukast, fexofenadine, mometasone, esomeprazole, famotidine-calcium carbonate-magnesium hydroxide, potassium chloride SA, LORazepam, Melatonin, ipratropium, carvedilol,  dicyclomine, traZODone, cyanocobalamin, Syringe (Disposable), zolpidem, furosemide, predniSONE, and ondansetron.  Meds ordered this encounter  Medications  . predniSONE (STERAPRED UNI-PAK 48 TAB) 10 MG (48) TBPK tablet    Refill:  0  . triamcinolone cream (KENALOG) 0.1 %    Sig: Apply 1 application topically 2 (two) times daily.    Dispense:  30 g    Refill:  0  . Lactulose 20 GM/30ML SOLN    Sig: 30 ml every 4 hours until constipation is relieved    Dispense:  236 mL    Refill:  3  . ondansetron (ZOFRAN) 8 MG tablet    Sig: Take 1 tablet (8 mg total) by mouth daily as needed. for nausea    Dispense:  90 tablet    Refill:  1  . losartan (COZAAR) 100 MG tablet    Sig: Take 1 tablet (100 mg total) by mouth daily.    Dispense:  90 tablet    Refill:  1   A total of 40 minutes was spent with patient more than half of which was spent in counseling patient on the above mentioned issues , reviewing and explaining recent labs and imaging studies done, and coordination of care. Medications Discontinued During This Encounter  Medication Reason  . ondansetron (ZOFRAN) 8 MG tablet Reorder  . losartan-hydrochlorothiazide (HYZAAR) 100-25 MG tablet     Follow-up: No Follow-up on file.   [name], MD

## 2017-02-18 NOTE — Patient Instructions (Addendum)
Stop the furosemide,  It may be dehydrating you.    I called in lactulose, a cathartic type laxative ,  You can take every 3-4 hours unitl it produces a BM  I refilled the zofran for nausea  Triamcinolone ointment for elbows,  Use twice daily   Once I see the plain films,  I will be able to decide on a trial of Linzess or amitiza to use daily for constipation .

## 2017-02-19 ENCOUNTER — Encounter: Payer: Self-pay | Admitting: Internal Medicine

## 2017-02-19 LAB — FOLATE RBC: RBC Folate: 443 ng/mL (ref >280)

## 2017-02-19 MED ORDER — LINACLOTIDE 290 MCG PO CAPS: 290.0000 ug | ORAL_CAPSULE | Freq: Every day | ORAL | 5 refills | Status: DC

## 2017-02-20 ENCOUNTER — Encounter: Payer: Self-pay | Admitting: Internal Medicine

## 2017-02-20 DIAGNOSIS — R5383 Other fatigue: Secondary | ICD-10-CM | POA: Insufficient documentation

## 2017-02-20 DIAGNOSIS — K5901 Slow transit constipation: Secondary | ICD-10-CM | POA: Insufficient documentation

## 2017-02-20 MED ORDER — LOSARTAN POTASSIUM 100 MG PO TABS: 100.0000 mg | ORAL_TABLET | Freq: Every day | ORAL | 1 refills | Status: DC

## 2017-02-20 NOTE — Assessment & Plan Note (Signed)
Stopping hctz due to decreased gfr,  Continue losartan 100 mg daily

## 2017-02-20 NOTE — Assessment & Plan Note (Signed)
Normal bowel gas pattern on plains films, which confirm constipation without obstruction.  Lactulose prn,  linzess trial.

## 2017-02-20 NOTE — Assessment & Plan Note (Signed)
Untreated since diagnosis .

## 2017-02-20 NOTE — Assessment & Plan Note (Addendum)
Screening labs normal.  Has  Untreated OSA .  Recommended participating in regular exercise program with goal of achieving a minimum of 30 minutes of aerobic activity 5 days per week. If no improvement,  needs repeat sleep study   Lab Results  Component Value Date   CREATININE 0.79 01/19/2016   Lab Results  Component Value Date   ALT 14 01/19/2016   AST 18 01/19/2016   ALKPHOS 69 01/19/2016   BILITOT 0.6 01/19/2016   Lab Results  Component Value Date   TSH 1.68 01/19/2016   Lab Results  Component Value Date   WBC 8.6 01/19/2016   HGB 14.7 01/19/2016   HCT 43.8 01/19/2016   MCV 92.7 01/19/2016   PLT 248.0 01/19/2016

## 2017-02-20 NOTE — Assessment & Plan Note (Signed)
Recurrent rlq pain likely secondary to IBS/constipation , given normal abd ct and colonoscopy

## 2017-02-23 ENCOUNTER — Encounter: Payer: Self-pay | Admitting: Internal Medicine

## 2017-02-23 DIAGNOSIS — K5909 Other constipation: Secondary | ICD-10-CM

## 2017-02-23 DIAGNOSIS — R110 Nausea: Secondary | ICD-10-CM

## 2017-05-17 DIAGNOSIS — Z9081 Acquired absence of spleen: Secondary | ICD-10-CM | POA: Insufficient documentation

## 2017-05-17 DIAGNOSIS — Z87898 Personal history of other specified conditions: Secondary | ICD-10-CM | POA: Insufficient documentation

## 2017-06-05 ENCOUNTER — Other Ambulatory Visit: Payer: Self-pay | Admitting: Internal Medicine

## 2017-07-24 ENCOUNTER — Encounter: Payer: Self-pay | Admitting: Internal Medicine

## 2017-07-25 ENCOUNTER — Encounter: Payer: Self-pay | Admitting: Internal Medicine

## 2017-07-25 ENCOUNTER — Other Ambulatory Visit (INDEPENDENT_AMBULATORY_CARE_PROVIDER_SITE_OTHER): Payer: BLUE CROSS/BLUE SHIELD

## 2017-07-25 ENCOUNTER — Other Ambulatory Visit: Payer: Self-pay | Admitting: Internal Medicine

## 2017-07-25 ENCOUNTER — Ambulatory Visit (INDEPENDENT_AMBULATORY_CARE_PROVIDER_SITE_OTHER): Payer: BLUE CROSS/BLUE SHIELD | Admitting: Internal Medicine

## 2017-07-25 ENCOUNTER — Other Ambulatory Visit: Payer: Self-pay | Admitting: *Deleted

## 2017-07-25 ENCOUNTER — Ambulatory Visit (INDEPENDENT_AMBULATORY_CARE_PROVIDER_SITE_OTHER): Payer: BLUE CROSS/BLUE SHIELD

## 2017-07-25 VITALS — BP 126/90 | HR 71 | Temp 98.0°F | Resp 16 | Ht 74.0 in | Wt 193.0 lb

## 2017-07-25 DIAGNOSIS — K6389 Other specified diseases of intestine: Secondary | ICD-10-CM

## 2017-07-25 DIAGNOSIS — R1031 Right lower quadrant pain: Secondary | ICD-10-CM | POA: Diagnosis not present

## 2017-07-25 DIAGNOSIS — K5901 Slow transit constipation: Secondary | ICD-10-CM | POA: Diagnosis not present

## 2017-07-25 DIAGNOSIS — K5900 Constipation, unspecified: Secondary | ICD-10-CM

## 2017-07-25 DIAGNOSIS — R197 Diarrhea, unspecified: Secondary | ICD-10-CM | POA: Diagnosis not present

## 2017-07-25 LAB — CBC WITH DIFFERENTIAL/PLATELET
BASOS ABS: 0 {cells}/uL (ref 0–200)
BASOS PCT: 0 %
EOS ABS: 237 {cells}/uL (ref 15–500)
Eosinophils Relative: 3 %
HEMATOCRIT: 46.2 % (ref 38.5–50.0)
HEMOGLOBIN: 15.8 g/dL (ref 13.2–17.1)
LYMPHS ABS: 2844 {cells}/uL (ref 850–3900)
Lymphocytes Relative: 36 %
MCH: 30.3 pg (ref 27.0–33.0)
MCHC: 34.2 g/dL (ref 32.0–36.0)
MCV: 88.7 fL (ref 80.0–100.0)
MONO ABS: 869 {cells}/uL (ref 200–950)
MONOS PCT: 11 %
MPV: 9.7 fL (ref 7.5–12.5)
NEUTROS ABS: 3950 {cells}/uL (ref 1500–7800)
Neutrophils Relative %: 50 %
PLATELETS: 425 10*3/uL — AB (ref 140–400)
RBC: 5.21 MIL/uL (ref 4.20–5.80)
RDW: 14.3 % (ref 11.0–15.0)
WBC: 7.9 10*3/uL (ref 3.8–10.8)

## 2017-07-25 LAB — COMPREHENSIVE METABOLIC PANEL
ALBUMIN: 4.1 g/dL (ref 3.6–5.1)
ALK PHOS: 73 U/L (ref 40–115)
ALT: 11 U/L (ref 9–46)
AST: 14 U/L (ref 10–35)
BUN: 13 mg/dL (ref 7–25)
CHLORIDE: 107 mmol/L (ref 98–110)
CO2: 21 mmol/L (ref 20–32)
Calcium: 9.4 mg/dL (ref 8.6–10.3)
Creat: 1.06 mg/dL (ref 0.70–1.25)
Glucose, Bld: 93 mg/dL (ref 65–99)
POTASSIUM: 4.1 mmol/L (ref 3.5–5.3)
Sodium: 142 mmol/L (ref 135–146)
TOTAL PROTEIN: 6.8 g/dL (ref 6.1–8.1)
Total Bilirubin: 0.3 mg/dL (ref 0.2–1.2)

## 2017-07-25 MED ORDER — LACTULOSE 20 GM/30ML PO SOLN: ORAL | 1 refills | Status: DC

## 2017-07-25 NOTE — Telephone Encounter (Addendum)
Patient on PCP schedule for 6:30 PM would you want patient in early for labs, having abdominal , pain nausea, constipated. Abdominal pain in lower left abdomen, has uses Linzess and magnesium as regular regimen and also tried 2 fleets enema's and only had small amount watery stool and took to dulcolax No formed stool more than one  week .[name]  Stated " feels like diverticulitis flair up." Patient called GI they recommended seeing PCP.

## 2017-07-25 NOTE — Addendum Note (Signed)
Addended by: Leeanne Rio on: 07/25/2017 10:09 AM   Modules accepted: Orders

## 2017-07-25 NOTE — Progress Notes (Unsigned)
Received verbal lactulose refilled patient notified.

## 2017-07-25 NOTE — Progress Notes (Signed)
Subjective:  Patient ID: Brian Atkins, male    DOB: Feb 29, 1956  Age: 61 y.o. MRN: [id number]  CC: The primary encounter diagnosis was Watery diarrhea. Diagnoses of Right lower quadrant abdominal pain, Slow transit constipation, and Small intestinal bacterial overgrowth were also pertinent to this visit.  HPI [name] presents for constipation and lower abdominal pain.  Patient has a complicated GI history that started around Feb 2-17 with recurrent episodes of bloating, nausea and constipation .  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 Normal colonoscopy and normal  abd CT  September  2017  Saw [facility] in June, Had a hydrogen breath test done which was VERY positive for bacterial overgrowth . Neomycin 500 mg bid x 10 days prescribed,  Currently on his SECOND ROUND of same medication .    In between epsiodes of diarrhea remains very constipated,  Uses Linzess and magnesium x 3 tablets daily ,  Still has to use stimulant laxatives every few days.    Saw [facility] on July 25.  abd  exam reported as normal, anal manometry was done,  Normal internal,  Weak external anal sphincter pressures.  Has been referred for outpatient rehab but the first appt is not for several weeks.  Referral is for Biofeedback for pelvic floor retraining with sensory percept retraining recommended   For the last two weeks, prior to second round of neomycin, has been having watery stools and bloating with anorexia, crampy abdominal pain and multiple food intolerances and nausea without emesis.  Has not improved with  Current round of  neomycin.  Last tuesday developed severe crampy abdominal pain  pain and persistent nausea.  [facility] told him to  take a Fleet's enema , had no stool output from the first one,  The second, returned only the enema. Took dulcolax. Developed signs of visibly increased peristalsis (accompanied by  lots of "noise" and gurgling, visible) still having explosive liquid diarrhea that has been aggravating his hemorrhoids. Has also noted worsening reflux esophagitis  , has increased nexium to twice daily .     Temps have been normal until one week ago  T max reportedly 100.  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  Unintentional weight loss noted due to anorexia.   Abdominal films and labs done prior to visit  Today  Outpatient Medications Prior to Visit  Medication Sig Dispense Refill  . carvedilol (COREG) 3.125 MG tablet Take 1 tablet (3.125 mg total) by mouth 2 (two) times daily with a meal. 180 tablet 3  . cyanocobalamin (,VITAMIN B-12,) 1000 MCG/ML injection 1 ml injected intramuscularly  For 4 days,  Then weekly for 4 weeks 10 mL 0  . dicyclomine (BENTYL) 10 MG capsule Take 1 capsule (10 mg total) by mouth 4 (four) times daily -  before meals and at bedtime. 360 capsule 1  . esomeprazole (NEXIUM) 40 MG capsule Take 40 mg by mouth daily at 12 noon.    . famotidine-calcium carbonate-magnesium hydroxide (PEPCID COMPLETE) 10-800-165 MG CHEW chewable tablet Chew 1 tablet by mouth 4 (four) times daily.    . fexofenadine (ALLEGRA) 180 MG tablet Take 180 mg by mouth daily.    . furosemide (LASIX) 20 MG tablet TAKE 1 TABLET (20 MG TOTAL) BY MOUTH DAILY. 30 tablet 3  . ipratropium (ATROVENT) 0.06 %  nasal spray Place 2 sprays into both nostrils 4 (four) times daily. 15 mL 12  . Lactulose 20 GM/30ML SOLN 30 ml every 4 hours until constipation is relieved 236 mL 1  . linaclotide (LINZESS) 290 MCG CAPS capsule Take 1 capsule (290 mcg total) by mouth daily before breakfast. 30 capsule 5  . LORazepam (ATIVAN) 1 MG tablet Take 1 tablet (1 mg total) by mouth every 8 (eight) hours as needed. 30 tablet 3  . losartan (COZAAR) 100 MG tablet Take 1 tablet (100 mg total) by mouth daily. 90 tablet 1  . Melatonin 3 MG TABS Take 1 tablet by mouth at bedtime.     . mometasone (NASONEX) 50  MCG/ACT nasal spray Place 2 sprays into the nose daily.    . montelukast (SINGULAIR) 10 MG tablet Take 1 tablet by mouth daily at 8 pm.    . ondansetron (ZOFRAN) 8 MG tablet Take 1 tablet (8 mg total) by mouth daily as needed. for nausea 90 tablet 1  . potassium chloride SA (K-DUR,KLOR-CON) 20 MEQ tablet Take 1 tablet (20 mEq total) by mouth daily. 30 tablet 3  . Syringe, Disposable, 3 ML MISC For use with B12 injections weekly/monthly 25 each 0  . traZODone (DESYREL) 50 MG tablet Take 0.5-1 tablets (25-50 mg total) by mouth at bedtime as needed for sleep. 30 tablet 3  . triamcinolone cream (KENALOG) 0.1 % Apply 1 application topically 2 (two) times daily. 30 g 0  . zolpidem (AMBIEN) 10 MG tablet TAKE 1 TABLET BY MOUTH AT BEDTIME 30 tablet 3  . predniSONE (STERAPRED UNI-PAK 48 TAB) 10 MG (48) TBPK tablet   0   No facility-administered medications prior to visit.     Review of Systems;  Patient denies headache, fevers, malaise, , skin rash, eye pain, sinus congestion and sinus pain, sore throat, dysphagia,  hemoptysis , cough, dyspnea, wheezing, chest pain, palpitations, orthopnea, edema,, melena,  flank pain, dysuria, hematuria, urinary  Frequency, nocturia, numbness, tingling, seizures,  Focal weakness, Loss of consciousness,  Tremor, insomnia, depression, anxiety, and suicidal ideation.      Objective:  BP 126/90 (BP Location: Left Arm, Patient Position: Sitting, Cuff Size: Normal)   Pulse 71   Temp 98 F (36.7 C) (Oral)   Resp 16   Ht '6\' 2"'  (1.88 m)   Wt 193 lb (87.5 kg)   SpO2 96%   BMI 24.78 kg/m   BP Readings from Last 3 Encounters:  07/25/17 126/90  02/18/17 120/90  09/13/16 124/82    Wt Readings from Last 3 Encounters:  07/25/17 193 lb (87.5 kg)  02/18/17 195 lb (88.5 kg)  09/13/16 197 lb (89.4 kg)    General appearance: alert, cooperative and appears stated age Ears: normal TM's and external ear canals both ears Throat: lips, mucosa, and tongue normal; teeth and  gums normal Neck: no adenopathy, no carotid bruit, supple, symmetrical, trachea midline and thyroid not enlarged, symmetric, no tenderness/mass/nodules Back: symmetric, no curvature. ROM normal. No CVA tenderness. Lungs: clear to auscultation bilaterally Heart: regular rate and rhythm, S1, S2 normal, no murmur, click, rub or gallop Abdomen: soft, non-tender; bowel sounds normal; no masses,  no organomegaly Pulses: 2+ and symmetric Skin: Skin color, texture, turgor normal. No rashes or lesions Lymph nodes: Cervical, supraclavicular, and axillary nodes normal.  Lab Results  Component Value Date   HGBA1C 5.0 03/02/2013   HGBA1C 5.0 10/25/2011    Lab Results  Component Value Date   CREATININE 1.06 07/25/2017  CREATININE 1.29 (H) 02/18/2017   CREATININE 0.9 04/01/2015    Lab Results  Component Value Date   WBC 7.9 07/25/2017   HGB 15.8 07/25/2017   HCT 46.2 07/25/2017   PLT 425 (H) 07/25/2017   GLUCOSE 93 07/25/2017   CHOL 212 (H) 03/02/2013   TRIG 168.0 (H) 03/02/2013   HDL 27.70 (L) 03/02/2013   LDLDIRECT 135.8 03/02/2013   LDLCALC 83 10/25/2011   ALT 11 07/25/2017   AST 14 07/25/2017   NA 142 07/25/2017   K 4.1 07/25/2017   CL 107 07/25/2017   CREATININE 1.06 07/25/2017   BUN 13 07/25/2017   CO2 21 07/25/2017   TSH 2.23 02/18/2017   PSA 1.92 03/02/2013   HGBA1C 5.0 03/02/2013    Dg Chest 2 View  Result Date: 05/16/2014 CLINICAL DATA:  Multiple splenic and liver lesions suspicious for sarcoid or lymphoma EXAM: CHEST  2 VIEW COMPARISON:  CT abdomen pelvis of 03/26/2014 and chest x-ray of 08/14/2009 FINDINGS: No active infiltrate or effusion is seen. No mediastinal or hilar adenopathy is noted. The heart is within normal limits in size. No bony abnormality is seen. IMPRESSION: No active cardiopulmonary disease. No mediastinal or hilar adenopathy is noted. Electronically Signed   By: Ivar Drape M.D.   On: 05/16/2014 09:45    Assessment & Plan:   Problem List Items  Addressed This Visit    Watery diarrhea - Primary    Present for the past two weeks despite treatment with neomycin for SB overgrowth.  Ruling out infectious colitis with GI pathogen panel .  ESR and CBC pending,  Will consider diverticulitis if either are elevated and will order CT scan      Relevant Orders   Gastrointestinal Pathogen Panel PCR   Small intestinal bacterial overgrowth    Confirmed with hydrogen breath test done  June 2018 at [facility]   Neomycin prescribed x 2 rounds,  Currently on second round with no improvement in diarrhea.       Slow transit constipation    Managed with Linzess and magnesium      Abdominal pain    Secondary to distension secondary to gas  and watery diarrhea.  Plain films not concerning for perforation or obstruction.  Ruling out infectious colitis with GI pathogen panel .  ESR and CBC pending,  Will consider diverticulitis if elevated and will order CT scan .  Already on maximal therapy,  Advised to try organic apple cider mixed with water        A total of 40 minutes was spent with patient more than half of which was spent in counseling patient on the above mentioned issues , reviewing prior tests done at [facility] , explaining recent labs and imaging studies done, and coordination of care.  I have discontinued Mr. Stefan predniSONE. I am also having him maintain his montelukast, fexofenadine, mometasone, esomeprazole, famotidine-calcium carbonate-magnesium hydroxide, potassium chloride SA, LORazepam, Melatonin, ipratropium, carvedilol, dicyclomine, traZODone, cyanocobalamin, Syringe (Disposable), furosemide, triamcinolone cream, ondansetron, linaclotide, losartan, zolpidem, Lactulose, bisacodyl, and magnesium oxide.  Meds ordered this encounter  Medications  . bisacodyl (DULCOLAX) 5 MG EC tablet    Sig: Take 10 mg by mouth.  . magnesium oxide (MAG-OX) 400 MG tablet    Sig: Take 400 mg by mouth daily.    Medications Discontinued During This Encounter    Medication Reason  . predniSONE (STERAPRED UNI-PAK 48 TAB) 10 MG (48) TBPK tablet Patient has not taken in last 30 days    Follow-up:  No Follow-up on file.   [name], MD

## 2017-07-25 NOTE — Patient Instructions (Signed)
Your abdominal film is reassuring that you are NOT obstructed and have NOT PERFORATED.  If the labs are normal,  We will not treat with antibiotics unless the stool studies suggest infection  Try drinking organic apple cider vinegar "with mother"  Mixed with water (at least 6 ounces) ; it may help and certainly won't hurt.

## 2017-07-26 ENCOUNTER — Encounter: Payer: Self-pay | Admitting: Internal Medicine

## 2017-07-26 ENCOUNTER — Other Ambulatory Visit: Payer: BLUE CROSS/BLUE SHIELD

## 2017-07-26 DIAGNOSIS — K6389 Other specified diseases of intestine: Secondary | ICD-10-CM | POA: Insufficient documentation

## 2017-07-26 DIAGNOSIS — R197 Diarrhea, unspecified: Secondary | ICD-10-CM

## 2017-07-26 LAB — SEDIMENTATION RATE: SED RATE: 1 mm/h (ref 0–20)

## 2017-07-26 NOTE — Assessment & Plan Note (Signed)
Managed with Linzess and magnesium

## 2017-07-26 NOTE — Assessment & Plan Note (Addendum)
Confirmed with hydrogen breath test done  June 2018 at [facility].   Neomycin prescribed x 2 rounds,  Currently on second round with no improvement in diarrhea.

## 2017-07-26 NOTE — Assessment & Plan Note (Signed)
Present for the past two weeks despite treatment with neomycin for SB overgrowth.  Ruling out infectious colitis with GI pathogen panel .  ESR and CBC pending,  Will consider diverticulitis if either are elevated and will order CT scan

## 2017-07-26 NOTE — Assessment & Plan Note (Addendum)
Secondary to distension secondary to gas  and watery diarrhea.  Plain films not concerning for perforation or obstruction.  Ruling out infectious colitis with GI pathogen panel .  ESR and CBC pending,  Will consider diverticulitis if elevated and will order CT scan .  Already on maximal therapy,  Advised to try organic apple cider mixed with water

## 2017-07-28 ENCOUNTER — Encounter: Payer: Self-pay | Admitting: Internal Medicine

## 2017-07-28 LAB — GASTROINTESTINAL PATHOGEN PANEL PCR
C. difficile Tox A/B, PCR: NOT DETECTED
CAMPYLOBACTER, PCR: NOT DETECTED
Cryptosporidium, PCR: NOT DETECTED
E COLI 0157, PCR: NOT DETECTED
E coli (ETEC) LT/ST PCR: NOT DETECTED
E coli (STEC) stx1/stx2, PCR: NOT DETECTED
GIARDIA LAMBLIA, PCR: NOT DETECTED
Norovirus, PCR: NOT DETECTED
ROTAVIRUS, PCR: NOT DETECTED
SHIGELLA, PCR: NOT DETECTED
Salmonella, PCR: NOT DETECTED

## 2017-07-29 ENCOUNTER — Encounter: Payer: Self-pay | Admitting: Internal Medicine

## 2017-08-03 ENCOUNTER — Other Ambulatory Visit: Payer: Self-pay | Admitting: Internal Medicine

## 2017-08-07 ENCOUNTER — Other Ambulatory Visit: Payer: Self-pay | Admitting: Internal Medicine

## 2017-08-12 ENCOUNTER — Other Ambulatory Visit: Payer: Self-pay | Admitting: Internal Medicine

## 2017-09-04 ENCOUNTER — Other Ambulatory Visit: Payer: Self-pay | Admitting: Internal Medicine

## 2017-09-14 NOTE — Telephone Encounter (Signed)
error 

## 2017-10-13 DIAGNOSIS — G4700 Insomnia, unspecified: Secondary | ICD-10-CM | POA: Diagnosis not present

## 2017-10-27 DIAGNOSIS — K5902 Outlet dysfunction constipation: Secondary | ICD-10-CM | POA: Diagnosis not present

## 2017-10-27 DIAGNOSIS — M6289 Other specified disorders of muscle: Secondary | ICD-10-CM | POA: Diagnosis not present

## 2017-10-31 ENCOUNTER — Ambulatory Visit: Payer: Self-pay | Admitting: *Deleted

## 2017-10-31 ENCOUNTER — Encounter: Payer: Self-pay | Admitting: Family Medicine

## 2017-10-31 ENCOUNTER — Ambulatory Visit: Payer: 59 | Admitting: Family Medicine

## 2017-10-31 DIAGNOSIS — R21 Rash and other nonspecific skin eruption: Secondary | ICD-10-CM

## 2017-10-31 DIAGNOSIS — J069 Acute upper respiratory infection, unspecified: Secondary | ICD-10-CM

## 2017-10-31 MED ORDER — CARVEDILOL 3.125 MG PO TABS: ORAL_TABLET | ORAL | Status: DC

## 2017-10-31 MED ORDER — BENZONATATE 200 MG PO CAPS: 200.0000 mg | ORAL_CAPSULE | Freq: Three times a day (TID) | ORAL | 0 refills | Status: DC | PRN

## 2017-10-31 MED ORDER — AMOXICILLIN-POT CLAVULANATE 875-125 MG PO TABS: 1.0000 | ORAL_TABLET | Freq: Two times a day (BID) | ORAL | 0 refills | Status: DC

## 2017-10-31 MED ORDER — TRIAMCINOLONE ACETONIDE 0.5 % EX CREA: 1.0000 "application " | TOPICAL_CREAM | Freq: Two times a day (BID) | CUTANEOUS | 0 refills | Status: DC | PRN

## 2017-10-31 NOTE — Patient Instructions (Signed)
Stop amoxil, change to augmentin.  Rest and fluids.  Tessalon for cough.  TAC 0.5% as needed for the rash.  Take care.  Glad to see you.

## 2017-10-31 NOTE — Telephone Encounter (Signed)
Pt's wife called due to pt having worsening cough and fever; Pt has had chemotherapy (last dose 2 years ago) and a splenectomy; Pt saw ENT on Saturday and was given amoxicillan; Per pt's wife, his normal temp after chemo as been 97 degrees;  Pt has had cough drops and Mucinex Max; cough was productive on Saturday with greenish yellow sputum  Reason for Disposition . [1] Fever > 101 F (38.3 C) AND [2] age > 11  Answer Assessment - Initial Assessment Questions 1. TEMPERATURE: "What is the most recent temperature?"  "How was it measured?"      101.0 tempanic 0740 today 2. ONSET: "When did the fever start?"      2 days ago 3. SYMPTOMS: "Do you have any other symptoms besides the fever?"  (e.g., colds, headache, sore throat, earache, cough, rash, diarrhea, vomiting, abdominal pain)     Deep cough 4. CAUSE: If there are no symptoms, ask: "What do you think is causing the fever?"      Unsure; pt has had lymphoma and has had spleen removed and chemotherapy 5. CONTACTS: "Does anyone else in the family have an infection?"     His son; sick for about 48 hrs cough and fever 6. TREATMENT: "What have you done so far to treat this fever?" (e.g., medications)     Tylenol 550 mg and  Ibuprofen 400 mg alternating  every 4-6 hours 7. IMMUNOCOMPROMISE: "Do you have of the following: diabetes, HIV positive, splenectomy, cancer chemotherapy, chronic steroid treatment, transplant patient, etc."     Last chemo 2 years ago; spleenectomy 8. PREGNANCY: "Is there any chance you are pregnant?" "When was your last menstrual period?"     n/a* 9. TRAVEL: "Have you traveled out of the country in the last month?" (e.g., travel history, exposures)     no  Protocols used: FEVER-A-AH

## 2017-10-31 NOTE — Progress Notes (Signed)
Med list updated, as patient has been taking.  Taking coreg QD, not BID.  I'll defer to PCP, d/w pt.    S/p splenectomy s/p MALT lymphoma, off with treatment at this point.    Sx started about 4 days ago.  Didn't feel well in general, sinus pressure and post nasal gtt.  Started on amoxil in the meantime by ENT, w/o relief.  Fever up to 102.6, this AM.  No vomiting. Dec in appetite.  He has chronic nausea.  Some cough, some sputum.  Tickle in the back of the throat leading to a cough.  L ear pain.  No rash except for chronic intermittent eczema rash on the B elbows.   TAC 0.1% only helped some with itching.    Meds, vitals, and allergies reviewed.   ROS: Per HPI unless specifically indicated in ROS section   GEN: nad, alert and oriented HEENT: mucous membranes moist, tm w/o erythema, nasal exam w/o erythema, clear discharge noted,  OP with cobblestoning NECK: supple w/o LA CV: rrr.   PULM: ctab, no inc wob EXT: no edema SKIN: no acute rash but old irritation noted on the B elbows.

## 2017-11-01 DIAGNOSIS — R21 Rash and other nonspecific skin eruption: Secondary | ICD-10-CM | POA: Insufficient documentation

## 2017-11-01 DIAGNOSIS — J069 Acute upper respiratory infection, unspecified: Secondary | ICD-10-CM | POA: Insufficient documentation

## 2017-11-01 NOTE — Assessment & Plan Note (Signed)
Okay to try triamcinolone 0.5%.  Follow-up with PCP as needed.  Discussed with patient.  He agrees.

## 2017-11-01 NOTE — Assessment & Plan Note (Signed)
Presumed sinusitis.  Lungs are clear.  Nontoxic.  Okay for outpatient follow-up.  Stop amoxicillin.  Change to Augmentin.  Supportive care.  Follow-up as needed.  He agrees.  Would treat given his history of lymphoma.

## 2017-11-07 ENCOUNTER — Ambulatory Visit: Payer: 59 | Admitting: Family Medicine

## 2017-11-08 ENCOUNTER — Emergency Department: Payer: Commercial Managed Care - HMO

## 2017-11-08 ENCOUNTER — Encounter: Payer: Self-pay | Admitting: Emergency Medicine

## 2017-11-08 ENCOUNTER — Other Ambulatory Visit: Payer: Self-pay

## 2017-11-08 DIAGNOSIS — I10 Essential (primary) hypertension: Secondary | ICD-10-CM | POA: Insufficient documentation

## 2017-11-08 DIAGNOSIS — R002 Palpitations: Secondary | ICD-10-CM | POA: Insufficient documentation

## 2017-11-08 DIAGNOSIS — Z79899 Other long term (current) drug therapy: Secondary | ICD-10-CM | POA: Diagnosis not present

## 2017-11-08 DIAGNOSIS — F410 Panic disorder [episodic paroxysmal anxiety] without agoraphobia: Secondary | ICD-10-CM | POA: Insufficient documentation

## 2017-11-08 DIAGNOSIS — R1013 Epigastric pain: Secondary | ICD-10-CM | POA: Diagnosis not present

## 2017-11-08 DIAGNOSIS — R079 Chest pain, unspecified: Secondary | ICD-10-CM | POA: Diagnosis not present

## 2017-11-08 LAB — COMPREHENSIVE METABOLIC PANEL
ALBUMIN: 4.3 g/dL (ref 3.5–5.0)
ALK PHOS: 81 U/L (ref 38–126)
ALT: 16 U/L — ABNORMAL LOW (ref 17–63)
ANION GAP: 13 (ref 5–15)
AST: 25 U/L (ref 15–41)
BILIRUBIN TOTAL: 1 mg/dL (ref 0.3–1.2)
BUN: 16 mg/dL (ref 6–20)
CALCIUM: 9.3 mg/dL (ref 8.9–10.3)
CO2: 24 mmol/L (ref 22–32)
Chloride: 104 mmol/L (ref 101–111)
Creatinine, Ser: 1.12 mg/dL (ref 0.61–1.24)
GFR calc non Af Amer: >60 mL/min (ref >60)
Glucose, Bld: 172 mg/dL — ABNORMAL HIGH (ref 65–99)
POTASSIUM: 3.6 mmol/L (ref 3.5–5.1)
SODIUM: 141 mmol/L (ref 135–145)
TOTAL PROTEIN: 7.7 g/dL (ref 6.5–8.1)

## 2017-11-08 LAB — CBC
HCT: 46.9 % (ref 40.0–52.0)
Hemoglobin: 15.6 g/dL (ref 13.0–18.0)
MCH: 30.3 pg (ref 26.0–34.0)
MCHC: 33.2 g/dL (ref 32.0–36.0)
MCV: 91.3 fL (ref 80.0–100.0)
PLATELETS: 492 10*3/uL — AB (ref 150–440)
RBC: 5.14 MIL/uL (ref 4.40–5.90)
RDW: 14.7 % — ABNORMAL HIGH (ref 11.5–14.5)
WBC: 16.6 10*3/uL — AB (ref 3.8–10.6)

## 2017-11-08 LAB — TROPONIN I

## 2017-11-08 LAB — LIPASE, BLOOD: Lipase: 20 U/L (ref 11–51)

## 2017-11-08 NOTE — ED Triage Notes (Addendum)
Pt to triage via w/c, pt anxious; pt reports 50min PTA had onset reflux going into throat, diarrhea with mid abd pain; st hx of same and takes medication only as needed

## 2017-11-09 ENCOUNTER — Emergency Department
Admission: EM | Admit: 2017-11-09 | Discharge: 2017-11-09 | Disposition: A | Discharge status: Home / Self Care | Payer: Commercial Managed Care - HMO | Attending: Emergency Medicine | Admitting: Emergency Medicine

## 2017-11-09 DIAGNOSIS — F410 Panic disorder [episodic paroxysmal anxiety] without agoraphobia: Secondary | ICD-10-CM

## 2017-11-09 DIAGNOSIS — R1013 Epigastric pain: Secondary | ICD-10-CM

## 2017-11-09 DIAGNOSIS — R002 Palpitations: Secondary | ICD-10-CM

## 2017-11-09 NOTE — ED Provider Notes (Signed)
[facility] Emergency Department Provider Note  ____________________________________________   First MD Initiated Contact with Patient 11/09/17 0148     (approximate)  I have reviewed the triage vital signs and the nursing notes.   HISTORY  Chief Complaint Gastroesophageal Reflux    HPI Brian Atkins is a 61 y.o. male who presents for evaluation of acute onset epigastric discomfort consistent with his prior acid reflux but also an episode of loose stools, palpitations, and tremulousness.  He states that he has a number of chronic medical issues and he has been treated recently by his primary and by Dr. Kathyrn Sheriff with antibiotics for probable lung infection.  He completed a course of Augmentin and is now taking clindamycin.  He reports that for about a week he has felt "just bad" with general malaise and a mild cough but no specific shortness of breath and no chest pain.  He previously felt some subjective fevers and chills but that has resolved.  He has chronic issues with constipation and/or diarrhea and takes a couple of different occasions for this and he previously had a Nissen fundoplication.  He states that tonight he was watching TV and acutely felt the urge to go to the bathroom and have a bowel movement and also had some acid reflux.  Once this started he became quite concerned and felt his heart racing and said that his blood pressure was up.  He describes the symptoms were severe, nothing made it better or worse, and it lasted at least 30 minutes.  He denies having any chest pain or shortness of breath during the episode.  He had some nausea but no vomiting.  When he came into the emergency department he was anxious and tremulous with a heart rate in the 110s but he settled down without any intervention and is essentially asymptomatic at this time except for some mild residual acid reflux which she states is normal.  He has no history of anxiety or panic attacks  of which she is aware.  He does not have diabetes or high cholesterol and has never smoked.  His father had extensive coronary artery disease and his wife reports that the patient had "a heart attack" while he was being treated for lymphoma but he did not require any stents or coronary artery surgery and he reports that the cardiologist said he was fine.  Past Medical History:  Diagnosis Date  . Diverticulitis   . Diverticulosis   . Dyslipidemia   . GERD (gastroesophageal reflux disease)   . Hemorrhoids   . Hiatal hernia   . Labile hypertension   . Medial epicondylitis of right elbow   . Meralgia paresthetica of right side   . Microscopic hematuria     Patient Active Problem List   Diagnosis Date Noted  . URI (upper respiratory infection) 11/01/2017  . Rash 11/01/2017  . Watery diarrhea 07/26/2017  . 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 Fatigue 02/20/2017  . Slow transit constipation 02/20/2017  . 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 Post-nasal drip 09/30/2015  . Dry mouth 09/30/2015  . Neuralgia, post-herpetic 11/18/2014  . Herpes zoster 11/07/2014  . Abdominal pain 05/17/2014  . 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 Routine general medical examination at a health care facility 03/04/2013  . Special screening for malignant neoplasm of prostate 12/07/2011  . Screening for colon cancer 12/07/2011  . Microscopic hematuria   . Hemorrhoids   .  Sleep apnea 10/01/2008  . HYPERLIPIDEMIA 09/26/2008  . Essential hypertension 09/26/2008  . GERD 09/26/2008    Past Surgical History:  Procedure Laterality Date  . nissen funduplication  9371   [name]    Prior to Admission medications   Medication Sig Start Date End Date Taking? Authorizing Provider  amoxicillin-clavulanate (AUGMENTIN) 875-125 MG tablet Take 1 tablet 2 (two) times daily by mouth. 10/31/17   Tonia Ghent, MD  benzonatate (TESSALON) 200  MG capsule Take 1 capsule (200 mg total) 3 (three) times daily as needed by mouth. 10/31/17   Tonia Ghent, MD  bisacodyl (DULCOLAX) 5 MG EC tablet Take 10 mg by mouth.    [provider]  carvedilol (COREG) 3.125 MG tablet TAKE 1 TABLET BY MOUTH 1 TIME DAILY WITH A MEAL. 10/31/17   Tonia Ghent, MD  esomeprazole (NEXIUM) 40 MG capsule Take 40 mg by mouth daily at 12 noon.    [provider]  famotidine-calcium carbonate-magnesium hydroxide (PEPCID COMPLETE) 10-800-165 MG CHEW chewable tablet Chew 1 tablet by mouth 4 (four) times daily.    [provider]  fexofenadine (ALLEGRA) 180 MG tablet Take 180 mg by mouth daily.    [provider]  ipratropium (ATROVENT) 0.06 % nasal spray Place 2 sprays into both nostrils 4 (four) times daily. 09/29/15   [name], MD  LINZESS 290 MCG CAPS capsule TAKE 1 CAPSULE (290 MCG TOTAL) BY MOUTH DAILY BEFORE BREAKFAST. 08/08/17   [name], MD  LORazepam (ATIVAN) 1 MG tablet Take 1 tablet (1 mg total) by mouth every 8 (eight) hours as needed. 09/15/15   [name], MD  losartan (COZAAR) 100 MG tablet TAKE 1 TABLET (100 MG TOTAL) BY MOUTH DAILY. 08/12/17   [name], MD  magnesium oxide (MAG-OX) 400 MG tablet Take 400 mg by mouth daily.    [provider]  Melatonin 3 MG TABS Take 1 tablet by mouth at bedtime.     [provider]  mometasone (NASONEX) 50 MCG/ACT nasal spray Place 2 sprays into the nose daily.    [provider]  montelukast (SINGULAIR) 10 MG tablet Take 1 tablet by mouth daily at 8 pm. 03/25/14   [provider]  ondansetron (ZOFRAN) 8 MG tablet Take 1 tablet (8 mg total) by mouth daily as needed. for nausea 02/18/17   [name], MD  Syringe, Disposable, 3 ML MISC For use with B12 injections weekly/monthly 09/16/16   [name], MD  triamcinolone cream (KENALOG) 0.5 % Apply 1 application 2 (two) times daily as needed topically. 10/31/17   Tonia Ghent, MD  zolpidem (AMBIEN) 10 MG tablet TAKE 1 TABLET BY MOUTH AT BEDTIME 08/04/17   [name], MD    Allergies Avelox [moxifloxacin]; Crestor [rosuvastatin calcium]; Morphine and related; Niacin-simvastatin er; and Tape  Family History  Problem Relation Age of Onset  . Coronary artery disease Father 73       CABG  . Aortic aneurysm Father 40       repaired  . Hyperlipidemia Father   . Lung cancer Mother   . Hypertension Mother   . Cancer Mother   . COPD Mother   . Stomach cancer Maternal Grandfather     Social History Social History   Tobacco Use  . Smoking status: Never Smoker  . Smokeless tobacco: Never Used  Substance Use Topics  . Alcohol use: No  . Drug use: No  Review of Systems Constitutional: No recent fever/chills Eyes: No visual changes. ENT: No sore throat. Cardiovascular: Denies chest pain. Respiratory: Denies shortness of breath.  Cough for about a week Gastrointestinal: Burning epigastric abdominal pain.  No nausea, no vomiting.  Acute onset loose stool.  no constipation. Genitourinary: Negative for dysuria. Musculoskeletal: Negative for neck pain.  Negative for back pain. Integumentary: Negative for rash. Neurological: Negative for headaches, focal weakness or numbness.   ____________________________________________   PHYSICAL EXAM:  VITAL SIGNS: ED Triage Vitals  Enc Vitals Group     BP 11/08/17 2227 (!) 158/86     Pulse Rate 11/08/17 2227 (!) 110     Resp 11/08/17 2227 20     Temp 11/08/17 2227 97.6 F (36.4 C)     Temp Source 11/08/17 2227 Oral     SpO2 11/08/17 2227 100 %     Weight 11/08/17 2226 87.5 kg (193 lb)     Height 11/08/17 2226 1.88 m (6\' 2" )     Head Circumference --      Peak Flow --      Pain Score 11/08/17 2226 5     Pain Loc --      Pain Edu? --      Excl. in [place]? --     Constitutional: Alert and oriented. Well appearing and in no acute distress. Eyes: Conjunctivae are normal.  Head:  Atraumatic. Nose: No congestion/rhinnorhea. Mouth/Throat: Mucous membranes are moist. Neck: No stridor.  No meningeal signs.   Cardiovascular: Normal rate, regular rhythm. Good peripheral circulation. Grossly normal heart sounds. Respiratory: Normal respiratory effort.  No retractions. Lungs CTAB. Gastrointestinal: Soft and nontender. No distention.  Musculoskeletal: No lower extremity tenderness nor edema. No gross deformities of extremities. Neurologic:  Normal speech and language. No gross focal neurologic deficits are appreciated.  Skin:  Skin is warm, dry and intact. No rash noted. Psychiatric: Mood and affect are normal. Speech and behavior are normal.  ____________________________________________   LABS (all labs ordered are listed, but only abnormal results are displayed)  Labs Reviewed  COMPREHENSIVE METABOLIC PANEL - Abnormal; Notable for the following components:      Result Value   Glucose, Bld 172 (*)    ALT 16 (*)    All other components within normal limits  CBC - Abnormal; Notable for the following components:   WBC 16.6 (*)    RDW 14.7 (*)    Platelets 492 (*)    All other components within normal limits  LIPASE, BLOOD  TROPONIN I   ____________________________________________  EKG  ED ECG REPORT #1 I, Hinda Kehr, the attending physician, personally viewed and interpreted this ECG.  Date: 11/09/2017 EKG Time: 22:26 Rate: 112 Rhythm: sinus tachycardia QRS Axis: normal Intervals: normal ST/T Wave abnormalities: Non-specific ST segment / T-wave changes, but no evidence of acute ischemia. Narrative Interpretation: heavy artifact due to tremulousness makes interpretation difficult   ED ECG REPORT #2 I, Hinda Kehr, the attending physician, personally viewed and interpreted this ECG.  Date: 11/09/2017 EKG Time: 02:16 Rate: 60 Rhythm: normal sinus rhythm QRS Axis: normal Intervals: normal ST/T Wave abnormalities: Non-specific ST segment / T-wave  changes, but no evidence of acute ischemia. Narrative Interpretation: no evidence of acute ischemia    ____________________________________________  RADIOLOGY   Dg Chest 2 View  Result Date: 11/08/2017 CLINICAL DATA:  Chest pain EXAM: CHEST  2 VIEW COMPARISON:  Chest radiograph 05/16/2014 FINDINGS: Shallow lung inflation with mild pulmonary vascular congestion. No overt edema. No pneumothorax or  pleural effusion. No focal airspace consolidation. Gas distended bowel in the upper abdomen. IMPRESSION: Shallow lung inflation with mild pulmonary vascular congestion. No overt pulmonary edema or acute cardiopulmonary abnormality. Electronically Signed   By: Ulyses Jarred M.D.   On: 11/08/2017 22:45    ____________________________________________   PROCEDURES  Critical Care performed: No   Procedure(s) performed:   Procedures   ____________________________________________   INITIAL IMPRESSION / ASSESSMENT AND PLAN / ED COURSE  As part of my medical decision making, I reviewed the following data within the [facility] History obtained from family, Nursing notes reviewed and incorporated, Labs reviewed  and EKG interpreted     Differential diagnosis includes, but is not limited to, ACS, aortic dissection, pulmonary embolism, cardiac tamponade, pneumothorax, pneumonia, pericarditis, myocarditis, GI-related causes including esophagitis/gastritis, and musculoskeletal chest wall pain.    However the patient is low risk for CAD/ACS.  His signs and symptoms are most consistent with a panic attack after feeling a couple of things including his acid reflux and an urgent bowel movement.  His presentation in triage was consistent with a panic attack and he calmed down and is now asymptomatic without any intervention.  His adult daughter is present in the room and reports that she felt that was what was going on but he did not believe it.  We had an extensive conversation about  emergent or acute issues but why I believe that this is most likely a panic attack and he states that he understands and agrees.  His lab work demonstrates a mild leukocytosis of 16.6 but he reports that he has been on prednisone and he is actively undergoing treatment for a nonspecific lung infection which I believe also accounts for the slight abnormality reported by the radiologist on the chest x-ray with no focal evidence of pneumonia.  His metabolic panel is reassuring and his troponin is negative.  He has no oxygen requirement and his tachycardia resolved without intervention and he is afebrile.  He and his family are comfortable with the plan for discharge and outpatient follow-up and I am giving them some information regarding panic attacks but also gave my usual customary return precautions.  We discussed obtaining a second troponin but I do not feel it is absolutely necessary based on HEART score the patient is comfortable not checking it as well.     ____________________________________________  FINAL CLINICAL IMPRESSION(S) / ED DIAGNOSES  Final diagnoses:  Epigastric pain  Palpitations  Panic attack     MEDICATIONS GIVEN DURING THIS VISIT:  Medications - No data to display   ED Discharge Orders    None       Note:  This document was prepared using Dragon voice recognition software and may include unintentional dictation errors.    Hinda Kehr, MD 11/09/17 [phone]

## 2017-11-09 NOTE — Discharge Instructions (Signed)
You have been seen in the Emergency Department (ED) today for several different symptoms.  As we have discussed today?s test results are normal, and we feel it is likely that a panic attack may have caused the majority of your symptoms.  Please follow up with the recommended doctor as instructed above in these documents regarding today?s emergent visit and your recent symptoms to discuss further management.  Continue to take your regular medications. If you are not doing so already, consider taking a daily baby aspirin (81 mg), at least until you follow up with your doctor.  We also encourage you to consider taking a daily Prilosec OTC to help with your chronic acid reflux.  Return to the Emergency Department (ED) if you experience any further chest pain/pressure/tightness, difficulty breathing, or sudden sweating, or other symptoms that concern you.

## 2017-11-17 DIAGNOSIS — M6289 Other specified disorders of muscle: Secondary | ICD-10-CM | POA: Diagnosis not present

## 2017-11-17 DIAGNOSIS — R531 Weakness: Secondary | ICD-10-CM | POA: Diagnosis not present

## 2017-11-17 DIAGNOSIS — K5902 Outlet dysfunction constipation: Secondary | ICD-10-CM | POA: Diagnosis not present

## 2017-11-21 ENCOUNTER — Telehealth: Payer: Self-pay | Admitting: Internal Medicine

## 2017-11-21 ENCOUNTER — Ambulatory Visit: Payer: 59 | Admitting: Internal Medicine

## 2017-11-21 ENCOUNTER — Encounter: Payer: Self-pay | Admitting: Internal Medicine

## 2017-11-21 VITALS — BP 132/88 | HR 70 | Temp 97.9°F | Resp 15 | Ht 74.0 in | Wt 203.6 lb

## 2017-11-21 DIAGNOSIS — D72829 Elevated white blood cell count, unspecified: Secondary | ICD-10-CM

## 2017-11-21 DIAGNOSIS — R0601 Orthopnea: Secondary | ICD-10-CM | POA: Diagnosis not present

## 2017-11-21 DIAGNOSIS — R7301 Impaired fasting glucose: Secondary | ICD-10-CM

## 2017-11-21 DIAGNOSIS — R0683 Snoring: Secondary | ICD-10-CM

## 2017-11-21 DIAGNOSIS — R1030 Lower abdominal pain, unspecified: Secondary | ICD-10-CM | POA: Diagnosis not present

## 2017-11-21 DIAGNOSIS — I471 Supraventricular tachycardia: Secondary | ICD-10-CM | POA: Diagnosis not present

## 2017-11-21 DIAGNOSIS — G4710 Hypersomnia, unspecified: Secondary | ICD-10-CM | POA: Diagnosis not present

## 2017-11-21 DIAGNOSIS — R0789 Other chest pain: Secondary | ICD-10-CM

## 2017-11-21 DIAGNOSIS — I479 Paroxysmal tachycardia, unspecified: Secondary | ICD-10-CM | POA: Diagnosis not present

## 2017-11-21 LAB — CBC WITH DIFFERENTIAL/PLATELET
BASOS ABS: 0.1 10*3/uL (ref 0.0–0.1)
Basophils Relative: 0.7 % (ref 0.0–3.0)
EOS ABS: 0.3 10*3/uL (ref 0.0–0.7)
Eosinophils Relative: 3.1 % (ref 0.0–5.0)
HCT: 45.4 % (ref 39.0–52.0)
Hemoglobin: 15.4 g/dL (ref 13.0–17.0)
LYMPHS PCT: 29.6 % (ref 12.0–46.0)
Lymphs Abs: 2.8 10*3/uL (ref 0.7–4.0)
MCHC: 33.8 g/dL (ref 30.0–36.0)
MCV: 93 fl (ref 78.0–100.0)
Monocytes Absolute: 1 10*3/uL (ref 0.1–1.0)
Monocytes Relative: 10.5 % (ref 3.0–12.0)
NEUTROS ABS: 5.3 10*3/uL (ref 1.4–7.7)
NEUTROS PCT: 56.1 % (ref 43.0–77.0)
PLATELETS: 338 10*3/uL (ref 150.0–400.0)
RBC: 4.88 Mil/uL (ref 4.22–5.81)
RDW: 15.4 % (ref 11.5–15.5)
WBC: 9.4 10*3/uL (ref 4.0–10.5)

## 2017-11-21 LAB — BRAIN NATRIURETIC PEPTIDE: PRO B NATRI PEPTIDE: 21 pg/mL (ref 0.0–100.0)

## 2017-11-21 LAB — TSH: TSH: 1.99 u[IU]/mL (ref 0.35–4.50)

## 2017-11-21 LAB — HEMOGLOBIN A1C: Hgb A1c MFr Bld: 5.8 % (ref 4.6–6.5)

## 2017-11-21 NOTE — Patient Instructions (Addendum)
Take an additional carvedilol tablet if you have another  Episode of rapid heart rate (rate > 100) that lasts > 5 minutes    Sleep study and cardiology referral in progress  Continue Nexium once daily and add 20 mg famotidine (pepcid) one hour before bedtime

## 2017-11-21 NOTE — Telephone Encounter (Signed)
Pt was in the office today to see Dr. Derrel Nip.

## 2017-11-21 NOTE — Telephone Encounter (Signed)
Called patient. Mailbox was full. Unable to leave message 

## 2017-11-21 NOTE — Telephone Encounter (Signed)
Pt's heart rate is going up and down. He would like to see Dr. Derrel Nip for the issue. Pt's wife states he has been to numerous providers over that last 2 weeks, including the ED. Pt would like to see [facility].

## 2017-11-21 NOTE — Telephone Encounter (Signed)
Patient wife called and stated she would like to give her appointment today to her husband due to he is having palpitations since ED visit on 11/09/17, and increased BP 150/100 could not give pulse rate yesterday was 144/98 she believed at that time pulse was 77 but at times jumps up to 123 and patient can feel chest pounding.  Patient was seen in ER for same symptoms as stated above on 11/09/17 patient does not want to return to ER . Changed appointment with wife for 1.30 today.

## 2017-11-21 NOTE — Progress Notes (Signed)
Subjective:  Patient ID: Brian Atkins, male    DOB: [birth year]  Age: 61 y.o. MRN: [id number]  CC: The primary encounter diagnosis was Tachycardia, paroxysmal ([place]). Diagnoses of Hypersomnolence, Snoring, Orthopnea, Leukocytosis, unspecified type, Impaired fasting glucose, Lower abdominal pain, Other chest pain, and Paroxysmal supraventricular tachycardia (HCC) were also pertinent to this visit.  HPI Brian Atkins presents for ER follow up for elevated heart rate .   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  Treatment  At [facility] in 2015 with radiation and rituximab was complicated by angioedema requiring intubation, splenic/portal vein thrombosis (treated with lovenox x 6 months), and chronic abdominal pain .   Current symptoms of tachycardia and dizziness were preceeded in mid November with persistent productive cough and shortness of breath which  Was treated  By other providers  as a respiratory infection (chest x ray Nov 20 negative for pneumonia) with amoxicillin,  Then augmentin, and finally clindamycin by ENT for persistent symptoms and fevers ( Tmax was  Reportedly 102  While  Taking augmentin) .   Dr Kathyrn Sheriff changed abx from augmentin to clindamycin on Friday Nov 16.    Patient presented  to ER  On  Nov 21 after developing having severe esophageal pain attributed to GERD esophagitis by patient,  acc'd by prlonged tachycardia reported as a pulse of 123,  along with elevated blood pressure and rigors. Review of ER records indicates that he ruled out for pancreatitis and myocardial  ischemia and was told he had a panic attack.  Contrary to ER notes,  He has been taking his prescribed carvedilol as directed (bid, not qd)   He continues to have recurrent dizziness ,  Without vertigo described as feeling light headed, constantly,  Not triggered by position change.  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   drinking gatorade  and caffeine free mountain dew.   Comes home for lunch and naps.  Exhausted by the end of the day and falls asleep before dinner, Snores per wife, with apneic spells.  Sleep study has been recommended and ordered  By [facility] but has been postponed by patient.  Wants to have it done here   Edema involving both legs is significant by the end of the day, improved by morning. Does not wear compression stockings.    Patient is a lifelong non smoker,  No diabetes,  has FH of CAD ,  But not early (father  in his early 26's requiring CABG x 3 )    Outpatient Medications Prior to Visit  Medication Sig Dispense Refill  . bisacodyl (DULCOLAX) 5 MG EC tablet Take 10 mg by mouth.    . carvedilol (COREG) 3.125 MG tablet TAKE 1 TABLET BY MOUTH 1 TIME DAILY WITH A MEAL.    [name] esomeprazole (NEXIUM) 40 MG capsule Take 40 mg by mouth daily at 12 noon.    . famotidine-calcium carbonate-magnesium hydroxide (PEPCID COMPLETE) 10-800-165 MG CHEW chewable tablet Chew 1 tablet by mouth 4 (four) times daily.    . fexofenadine (ALLEGRA) 180 MG tablet Take 180 mg by mouth daily.    [name] ipratropium (ATROVENT) 0.06 % nasal spray Place 2 sprays into both nostrils 4 (four) times daily. 15 mL 12  . LINZESS 290 MCG CAPS capsule TAKE 1 CAPSULE (290 MCG TOTAL) BY MOUTH  DAILY BEFORE BREAKFAST. 30 capsule 5  . LORazepam (ATIVAN) 1 MG tablet Take 1 tablet (1 mg total) by mouth every 8 (eight) hours as needed. 30 tablet 3  . losartan (COZAAR) 100 MG tablet TAKE 1 TABLET (100 MG TOTAL) BY MOUTH DAILY. 90 tablet 1  . magnesium oxide (MAG-OX) 400 MG tablet Take 400 mg by mouth daily.    . Melatonin 3 MG TABS Take 1 tablet by mouth at bedtime.     . ondansetron (ZOFRAN) 8 MG tablet Take 1 tablet (8 mg total) by mouth daily as needed. for nausea 90 tablet 1  . Syringe, Disposable, 3 ML MISC For use with B12 injections weekly/monthly 25 each 0  . triamcinolone  cream (KENALOG) 0.5 % Apply 1 application 2 (two) times daily as needed topically. 30 g 0  . zolpidem (AMBIEN) 10 MG tablet TAKE 1 TABLET BY MOUTH AT BEDTIME 30 tablet 3  . benzonatate (TESSALON) 200 MG capsule Take 1 capsule (200 mg total) 3 (three) times daily as needed by mouth. (Patient not taking: Reported on 11/21/2017) 30 capsule 0  . amoxicillin-clavulanate (AUGMENTIN) 875-125 MG tablet Take 1 tablet 2 (two) times daily by mouth. (Patient not taking: Reported on 11/21/2017) 20 tablet 0  . mometasone (NASONEX) 50 MCG/ACT nasal spray Place 2 sprays into the nose daily.    . montelukast (SINGULAIR) 10 MG tablet Take 1 tablet by mouth daily at 8 pm.     No facility-administered medications prior to visit.     Review of Systems;  Patient denies headache, fevers, malaise, unintentional weight loss, skin rash, eye pain, sinus congestion and sinus pain, sore throat, dysphagia,  hemoptysis , cough, dyspnea, wheezing, chest pain, palpitations, orthopnea, edema, abdominal pain, nausea, melena, diarrhea, constipation, flank pain, dysuria, hematuria, urinary  Frequency, nocturia, numbness, tingling, seizures,  Focal weakness, Loss of consciousness,  Tremor, insomnia, depression, anxiety, and suicidal ideation.      Objective:  BP 132/88 (BP Location: Left Arm, Patient Position: Sitting, Cuff Size: Normal)   Pulse 70   Temp 97.9 F (36.6 C) (Oral)   Resp 15   Ht 6\' 2"  (1.88 m)   Wt 203 lb 9.6 oz (92.4 kg)   SpO2 96%   BMI 26.14 kg/m   BP Readings from Last 3 Encounters:  11/21/17 132/88  11/09/17 (!) 154/95  10/31/17 130/82    Wt Readings from Last 3 Encounters:  11/21/17 203 lb 9.6 oz (92.4 kg)  11/08/17 193 lb (87.5 kg)  10/31/17 193 lb 8 oz (87.8 kg)    General appearance: alert, cooperative and appears stated age Ears: normal TM's and external ear canals both ears Throat: lips, mucosa, and tongue normal; teeth and gums normal Neck: no adenopathy, no carotid bruit, supple,  symmetrical, trachea midline and thyroid not enlarged, symmetric, no tenderness/mass/nodules Back: symmetric, no curvature. ROM normal. No CVA tenderness. Lungs: clear to auscultation bilaterally Heart: regular rate and rhythm, S1, S2 normal, no murmur, click, rub or gallop Abdomen: soft, non-tender; bowel sounds normal; no masses,  no organomegaly Pulses: 2+ and symmetric Skin: Skin color, texture, turgor normal. No rashes or lesions Lymph nodes: Cervical, supraclavicular, and axillary nodes normal.  Lab Results  Component Value Date   HGBA1C 5.8 11/21/2017   HGBA1C 5.0 03/02/2013   HGBA1C 5.0 10/25/2011    Lab Results  Component Value Date   CREATININE 1.12 11/08/2017   CREATININE 1.06 07/25/2017   CREATININE 1.29 (H) 02/18/2017    Lab Results  Component Value Date  WBC 9.4 11/21/2017   HGB 15.4 11/21/2017   HCT 45.4 11/21/2017   PLT 338.0 11/21/2017   GLUCOSE 172 (H) 11/08/2017   CHOL 212 (H) 03/02/2013   TRIG 168.0 (H) 03/02/2013   HDL 27.70 (L) 03/02/2013   LDLDIRECT 135.8 03/02/2013   LDLCALC 83 10/25/2011   ALT 16 (L) 11/08/2017   AST 25 11/08/2017   NA 141 11/08/2017   K 3.6 11/08/2017   CL 104 11/08/2017   CREATININE 1.12 11/08/2017   BUN 16 11/08/2017   CO2 24 11/08/2017   TSH 1.99 11/21/2017   PSA 1.92 03/02/2013   HGBA1C 5.8 11/21/2017    No results found.  Assessment & Plan:   Problem List Items Addressed This Visit    Abdominal pain    Chronic,  Per hematology, thought to be associated with MALT iduced angioedema.  Has seen GI at  [facility]. EGD and colonoscopy done Sept 2017, no active involvement       Chest pain    Presumed to be secondary to GERD by patient,  But accompanied by sinus tachycardia .  ER ruled out AMI with one troponin but did not rule out PE and patient has history of portal vein thrombosis which occurred during treatment for MALT.  He is not hypoxic or short of breath and pain has resolved since ER visit so PE unlikely.  EKG today  shows no ischemic changes.  Continue PPI,  Referral to cardiology for  Noninvasive  workup .       Paroxysmal supraventricular tachycardia (HCC)    EKG normal today, but EKG from ER visit has the appearance of atrial flutter.  .  Further workup needed .  Cardiology consult ordered.  Patient advised to take an additional dose of carvedilol for next occurrence until workup can be done.  Sleep study ordered today ; he is willing to have this done now,  locally       Other Visit Diagnoses    Tachycardia, paroxysmal ([place])    -  Primary   Relevant Orders   EKG 12-Lead (Completed)   Ambulatory referral to Cardiology   B Nat Peptide (Completed)   TSH (Completed)   Hypersomnolence       Relevant Orders   Nocturnal polysomnography (NPSG)   Snoring       Relevant Orders   Nocturnal polysomnography (NPSG)   Orthopnea       Relevant Orders   B Nat Peptide (Completed)   Leukocytosis, unspecified type       Relevant Orders   CBC with Differential/Platelet (Completed)   Impaired fasting glucose       Relevant Orders   Hemoglobin A1c (Completed)    A total of 40 minutes was spent with patient more than half of which was spent in counseling patient on the above mentioned issues , reviewing and explaining recent ER visit including  labs and imaging studies done, and coordination of care.  I have discontinued Cristopher Estimable. Mcclane's montelukast, mometasone, and amoxicillin-clavulanate. I am also having him maintain his fexofenadine, esomeprazole, famotidine-calcium carbonate-magnesium hydroxide, LORazepam, Melatonin, ipratropium, Syringe (Disposable), ondansetron, bisacodyl, magnesium oxide, zolpidem, LINZESS, losartan, carvedilol, benzonatate, and triamcinolone cream.  No orders of the defined types were placed in this encounter.   Medications Discontinued During This Encounter  Medication Reason  . amoxicillin-clavulanate (AUGMENTIN) 875-125 MG tablet Completed Course  . mometasone (NASONEX) 50  MCG/ACT nasal spray Patient has not taken in last 30 days  . montelukast (SINGULAIR) 10 MG tablet  Patient has not taken in last 30 days    Follow-up: No Follow-up on file.   [name], MD

## 2017-11-22 DIAGNOSIS — R079 Chest pain, unspecified: Secondary | ICD-10-CM | POA: Insufficient documentation

## 2017-11-22 DIAGNOSIS — I471 Supraventricular tachycardia: Secondary | ICD-10-CM | POA: Insufficient documentation

## 2017-11-22 NOTE — Assessment & Plan Note (Addendum)
EKG normal today, but EKG from ER visit has the appearance of atrial flutter.  .  Further workup needed .  Cardiology consult ordered.  Patient advised to take an additional dose of carvedilol for next occurrence until workup can be done.  Sleep study ordered today ; he is willing to have this done now,  locally

## 2017-11-22 NOTE — Assessment & Plan Note (Signed)
Presumed to be secondary to GERD by patient,  But accompanied by sinus tachycardia .  ER ruled out AMI with one troponin but did not rule out PE and patient has history of portal vein thrombosis which occurred during treatment for MALT.  He is not hypoxic or short of breath and pain has resolved since ER visit so PE unlikely.  EKG today shows no ischemic changes.  Continue PPI,  Referral to cardiology for  Noninvasive  workup .

## 2017-11-22 NOTE — Assessment & Plan Note (Addendum)
Chronic,  Per hematology, thought to be associated with MALT iduced angioedema.  Has seen GI at  [facility]. EGD and colonoscopy done Sept 2017, no active involvement

## 2017-11-23 DIAGNOSIS — M6289 Other specified disorders of muscle: Secondary | ICD-10-CM | POA: Diagnosis not present

## 2017-11-23 DIAGNOSIS — K5902 Outlet dysfunction constipation: Secondary | ICD-10-CM | POA: Diagnosis not present

## 2017-11-23 DIAGNOSIS — R531 Weakness: Secondary | ICD-10-CM | POA: Diagnosis not present

## 2017-11-24 DIAGNOSIS — H532 Diplopia: Secondary | ICD-10-CM | POA: Diagnosis not present

## 2017-11-24 DIAGNOSIS — C884 Extranodal marginal zone B-cell lymphoma of mucosa-associated lymphoid tissue [MALT-lymphoma]: Secondary | ICD-10-CM | POA: Diagnosis not present

## 2017-11-24 DIAGNOSIS — J45909 Unspecified asthma, uncomplicated: Secondary | ICD-10-CM | POA: Diagnosis not present

## 2017-11-24 DIAGNOSIS — C8599 Non-Hodgkin lymphoma, unspecified, extranodal and solid organ sites: Secondary | ICD-10-CM | POA: Diagnosis not present

## 2017-11-24 DIAGNOSIS — C8580 Other specified types of non-Hodgkin lymphoma, unspecified site: Secondary | ICD-10-CM | POA: Diagnosis not present

## 2017-11-30 ENCOUNTER — Ambulatory Visit (INDEPENDENT_AMBULATORY_CARE_PROVIDER_SITE_OTHER): Payer: 59 | Admitting: Internal Medicine

## 2017-11-30 ENCOUNTER — Encounter: Payer: Self-pay | Admitting: Internal Medicine

## 2017-11-30 ENCOUNTER — Other Ambulatory Visit: Payer: Self-pay | Admitting: Internal Medicine

## 2017-11-30 VITALS — BP 160/98 | HR 69 | Ht 74.0 in | Wt 202.5 lb

## 2017-11-30 DIAGNOSIS — Z01810 Encounter for preprocedural cardiovascular examination: Secondary | ICD-10-CM | POA: Diagnosis not present

## 2017-11-30 DIAGNOSIS — R51 Headache: Secondary | ICD-10-CM | POA: Diagnosis not present

## 2017-11-30 DIAGNOSIS — R002 Palpitations: Secondary | ICD-10-CM | POA: Diagnosis not present

## 2017-11-30 DIAGNOSIS — I10 Essential (primary) hypertension: Secondary | ICD-10-CM | POA: Diagnosis not present

## 2017-11-30 DIAGNOSIS — R0602 Shortness of breath: Secondary | ICD-10-CM

## 2017-11-30 DIAGNOSIS — R079 Chest pain, unspecified: Secondary | ICD-10-CM

## 2017-11-30 DIAGNOSIS — R519 Headache, unspecified: Secondary | ICD-10-CM

## 2017-11-30 MED ORDER — NITROGLYCERIN 0.4 MG SL SUBL: 0.4000 mg | SUBLINGUAL_TABLET | SUBLINGUAL | 3 refills | Status: DC | PRN

## 2017-11-30 MED ORDER — ISOSORBIDE MONONITRATE ER 30 MG PO TB24: 30.0000 mg | ORAL_TABLET | Freq: Every day | ORAL | 3 refills | Status: DC

## 2017-11-30 MED ORDER — ASPIRIN EC 81 MG PO TBEC: 81.0000 mg | DELAYED_RELEASE_TABLET | Freq: Every day | ORAL | 3 refills | Status: DC

## 2017-11-30 NOTE — H&P (View-Only) (Signed)
New Outpatient Visit Date: 11/30/2017  Referring Provider: [name], MD [address] [place], [place] [address]  Chief Complaint: Palpitations and chest pain  HPI:  Mr. Chavarin is a 61 y.o. male who is being seen today for the evaluation of palpitations and chest pain at the request of Dr. Derrel Nip. He has a history of hypertension, marginal zone B-cell lymphoma, and GERD status post Nissen fundoplication. Over the last 1-2 months, Mr. Hamada has felt as though his heart is pounding on a frequent basis. Sometimes it also seems to be beating faster than expected. Symptoms are happening every day without clear precipitants. Duration is variable. He notes accompanying lightheadedness. Mr. Gibbs has also experienced intermittent chest pain over the last few months. He has occasional burning reminiscent of what he experienced prior to his fundoplication. However, he also notices intermittent tightness radiating from his upper abdomen to the neck, which can be quite severe. The discomfort does not seem to be exertional and typically lasts a few minutes at a time. However, while shoveling snow his past week, he felt very short of breath and fatigued with some accompanying chest tightness.  Mr. Dugal underwent stress echocardiogram at [facility] in 2016 in anticipation of splenectomy. Patient's wife states that the test was ordered due to history of a "heart attack" that occurred while Mr. Orrego was receiving chemotherapy for his lymphoma. It does not appear that he has ever undergone cardiac catheterization.  Mr. Dubey also notes that his blood pressure has been more labile and frequently elevated over the last 2 months. When he notices his heart pounding, his blood pressure is often elevated, up to 163/102. When this occurs, he will frequently take an extra dose of carvedilol. He is accompanying headaches from time to time. There have not been any recent medication changes, though Mr. Jerlyn Ly was started on  local lax and winds as approximately 3 months ago due to constipation and slow colonic motility.  --------------------------------------------------------------------------------------------------  Cardiovascular History & Procedures: Cardiovascular Problems:  Palpitations  Atypical chest pain with questionable history of heart attack while receiving chemotherapy  Shortness of breath  Risk Factors:  Hypertension and male gender  Cath/PCI:  None  CV Surgery:  None  EP Procedures and Devices:  None  Non-Invasive Evaluation(s):  Exercise stress echocardiogram (02/26/15, [facility]): Normal exercise stress echocardiogram with LVEF greater than 55% and no wall motion abnormalities.  Recent CV Pertinent Labs: Lab Results  Component Value Date   CHOL 212 (H) 03/02/2013   HDL 27.70 (L) 03/02/2013   LDLCALC 83 10/25/2011   LDLDIRECT 135.8 03/02/2013   TRIG 168.0 (H) 03/02/2013   CHOLHDL 8 03/02/2013   INR 1.0 11/30/2017   K 4.5 11/30/2017   K 3.9 03/28/2014   MG 2.0 03/28/2014   BUN 14 11/30/2017   BUN 12 03/28/2014   CREATININE 0.96 11/30/2017   CREATININE 1.06 07/25/2017    --------------------------------------------------------------------------------------------------  Past Medical History:  Diagnosis Date  . Diverticulitis   . Diverticulosis   . Dyslipidemia   . GERD (gastroesophageal reflux disease)   . Hemorrhoids   . Hiatal hernia   . Labile hypertension   . Lymphoma ([place])   . Medial epicondylitis of right elbow   . Meralgia paresthetica of right side   . Microscopic hematuria     Past Surgical History:  Procedure Laterality Date  . nissen funduplication  0626   [name]  . SPLENECTOMY      Current Meds  Medication Sig  .  benzonatate (TESSALON) 200 MG capsule Take 1 capsule (200 mg total) 3 (three) times daily as needed by mouth.  . bisacodyl (DULCOLAX) 5 MG EC tablet Take 10 mg by mouth.  . carvedilol (COREG) 3.125 MG tablet TAKE 1 TABLET BY  MOUTH 1 TIME DAILY WITH A MEAL. (Patient taking differently: 3.125 mg 2 (two) times daily with a meal. TAKE 1 TABLET BY MOUTH 1 TIME DAILY WITH A MEAL.)  . esomeprazole (NEXIUM) 40 MG capsule Take 40 mg by mouth daily at 12 noon.  . famotidine-calcium carbonate-magnesium hydroxide (PEPCID COMPLETE) 10-800-165 MG CHEW chewable tablet Chew 1 tablet by mouth 4 (four) times daily.  [name] ipratropium (ATROVENT) 0.06 % nasal spray Place 2 sprays into both nostrils 4 (four) times daily.  [name] LINZESS 290 MCG CAPS capsule TAKE 1 CAPSULE (290 MCG TOTAL) BY MOUTH DAILY BEFORE BREAKFAST.  [name] LORazepam (ATIVAN) 1 MG tablet Take 1 tablet (1 mg total) by mouth every 8 (eight) hours as needed.  [name] losartan (COZAAR) 100 MG tablet TAKE 1 TABLET (100 MG TOTAL) BY MOUTH DAILY.  . magnesium oxide (MAG-OX) 400 MG tablet Take 400 mg by mouth daily.  . Melatonin 3 MG TABS Take 1 tablet by mouth at bedtime.   . ondansetron (ZOFRAN) 8 MG tablet Take 1 tablet (8 mg total) by mouth daily as needed. for nausea  . Syringe, Disposable, 3 ML MISC For use with B12 injections weekly/monthly  . triamcinolone cream (KENALOG) 0.5 % Apply 1 application 2 (two) times daily as needed topically.  [name] zolpidem (AMBIEN) 10 MG tablet TAKE 1 TABLET BY MOUTH AT BEDTIME    Allergies: Avelox [moxifloxacin]; Contrast media [iodinated diagnostic agents]; Crestor [rosuvastatin calcium]; Morphine and related; Niacin-simvastatin er; and Tape  Social History   Socioeconomic History  . Marital status: Married    Spouse name: Not on file  . Number of children: 2  . Years of education: 56  . Highest education level: Not on file  Social Needs  . Financial resource strain: Not on file  . Food insecurity - worry: Not on file  . Food insecurity - inability: Not on file  . Transportation needs - medical: Not on file  . Transportation needs - non-medical: Not on file  Occupational History  . Occupation: Shop Printmaker: [employer]    Comment:  [place]  Tobacco Use  . Smoking status: Never Smoker  . Smokeless tobacco: Never Used  Substance and Sexual Activity  . Alcohol use: No  . Drug use: No  . Sexual activity: Yes  Other Topics Concern  . Not on file  Social History Narrative  . Not on file    Family History  Problem Relation Age of Onset  . Coronary artery disease Father 68       CABG  . Aortic aneurysm Father 36       repaired  . Hyperlipidemia Father   . Lung cancer Mother   . Hypertension Mother   . Cancer Mother        bladder  . COPD Mother   . Stomach cancer Maternal Grandfather     Review of Systems: Patient notes intermittent double vision, scheduled for MRI of the brain in the next few weeks [facility]. Otherwise, a 12-system review of systems was performed and was negative except as noted in the HPI.  --------------------------------------------------------------------------------------------------  Physical Exam: BP (!) 160/98 (BP Location: Right Arm, Patient Position: Sitting, Cuff Size: Normal)   Pulse 69  Ht 6\' 2"  (1.88 m)   Wt 202 lb 8 oz (91.9 kg)   BMI 26.00 kg/m   General:  Overweight man, seated comfortably in the exam room. He is accompanied by his wife. HEENT: No conjunctival pallor or scleral icterus. Moist mucous membranes. OP clear. Neck: Supple without lymphadenopathy, thyromegaly, JVD, or HJR. No carotid bruit. Lungs: Normal work of breathing. Clear to auscultation bilaterally without wheezes or crackles. Heart: Regular rate and rhythm without murmurs, rubs, or gallops. Non-displaced PMI. Abd: Bowel sounds present. Soft, NT/ND without hepatosplenomegaly Ext: No lower extremity edema. Radial, PT, and DP pulses are 2+ bilaterally Skin: Warm and dry without rash. Neuro: CNIII-XII intact. Strength and fine-touch sensation intact in upper and lower extremities bilaterally. Psych: Normal mood and affect.  EKG:  Normal sinus rhythm without abnormalities.  Lab Results  Component  Value Date   WBC 8.2 11/30/2017   HGB 15.2 11/30/2017   HCT 44.5 11/30/2017   MCV 88 11/30/2017   PLT 306 11/30/2017    Lab Results  Component Value Date   NA 145 (H) 11/30/2017   K 4.5 11/30/2017   CL 104 11/30/2017   CO2 25 11/30/2017   BUN 14 11/30/2017   CREATININE 0.96 11/30/2017   GLUCOSE 86 11/30/2017   ALT 16 (L) 11/08/2017    Lab Results  Component Value Date   CHOL 212 (H) 03/02/2013   HDL 27.70 (L) 03/02/2013   LDLCALC 83 10/25/2011   LDLDIRECT 135.8 03/02/2013   TRIG 168.0 (H) 03/02/2013   CHOLHDL 8 03/02/2013    --------------------------------------------------------------------------------------------------  ASSESSMENT AND PLAN: Chest pain and shortness of breath There are some atypical features, as the pain is not clearly exertional and comes and goes throughout the day. However, given that Mr. Pattillo had recurrence of chest pain with accompanying dyspnea and fatigue while shoveling snow earlier this week, I am concerned about significant CAD. Additionally, Mr. Macek and his wife reported a possible history of prior MI. Though exercise stress echocardiogram in 2016 was normal, I believe that further assessment is warranted. We have discussed the risks and benefits of noninvasive testing and cardiac catheterization and have agreed to proceed directly to left heart catheterization. I have reviewed the risks, indications, and alternatives to cardiac catheterization, possible angioplasty, and stenting with the patient. Risks include but are not limited to bleeding, infection, vascular injury, stroke, myocardial infection, arrhythmia, kidney injury, radiation-related injury in the case of prolonged fluoroscopy use, emergency cardiac surgery, and death. The patient understands the risks of serious complication is 1-2 in 3419 with diagnostic cardiac cath and 1-2% or less with angioplasty/stenting. I have encouraged Mr. Hartwell to continue taking aspirin 81 mg daily and carvedilol  3.125 mg twice a day. We will start isosorbide mononitrate 30 mg daily. I also provided him with a prescription for supple nitroglycerin to be used as needed. If he has worsening chest pain before his catheterization can be scheduled, he was advised to seek immediate medical attention in the emergency department.  Palpitations Predominantly a more forceful heartbeat, though increased heart rate also reported by Mr. Kumpf. EKG today shows no arrhythmia. If cardiac catheterization is unrevealing, we will consider placement of a Holter monitor.  Headache Currently being evaluated by his other providers. Mr. Alred states that he is scheduled for an MRI of the brain in the coming weeks, given that his lymphoma previously presented in the orbit and that he has intermittently experienced double vision. I have advised Mr. Montilla to speak with his  oncologist about expediting MRI given her upcoming catheterization. I will also recheck out to her today that she is aware of plans for cardiac catheterization.  Hypertension Blood pressure is moderately elevated today. We will continue carvedilol 3.125 mg twice a day and losartan 100 mg daily, as well as add isosorbide mononitrate 30 mg daily.  Follow-up: To be determined based on results of cardiac catheterization.  Nelva Bush, MD 12/01/2017 7:09 AM

## 2017-11-30 NOTE — Patient Instructions (Addendum)
Medication Instructions:  Your physician has recommended you make the following change in your medication:  1- Continue taking Aspirin 81 mg by mouth once a day. 2- Start Imdur 30 mg  (1 tablet) by mouth once a day. 3- Take Nitroglycerin 0.4 mg (1 tablet) dissolved under your tongue every 5 minutes as needed for chest pain. Do not EXCEED 3 doses.   If chest pain does not subside, please call 911 and go to the Emergency room to seek immediate medical attention.   Labwork: Your physician recommends that you return for lab work in: TODAY (CBC, BMET, PT/INR).   Testing/Procedures: Your physician has requested that you have a cardiac catheterization. Cardiac catheterization is used to diagnose and/or treat various heart conditions. Doctors may recommend this procedure for a number of different reasons. The most common reason is to evaluate chest pain. Chest pain can be a symptom of coronary artery disease (CAD), and cardiac catheterization can show whether plaque is narrowing or blocking your heart's arteries. This procedure is also used to evaluate the valves, as well as measure the blood flow and oxygen levels in different parts of your heart. For further information please visit [email or web address]. Please follow instruction sheet, as given.  [facility] Cardiac Cath Instructions   You are scheduled for a Cardiac Cath on:___12/19/18_______  Please arrive at __07:30 __am on the day of your procedure  Please expect a call from our [place] to pre-register you  Do not eat/drink anything after midnight  Someone will need to drive you home  It is recommended someone be with you for the first 24 hours after your procedure  Wear clothes that are easy to get on/off and wear slip on shoes if possible   Medications bring a current list of all medications with you  _XX_ You may take all of your medications the morning of your procedure with enough water to swallow  safely   Day of your procedure: Arrive at the [place] entrance.  Free valet service is available.  After entering the [place] please check-in at the registration desk (1st desk on your right) to receive your armband. After receiving your armband someone will escort you to the cardiac cath/special procedures waiting area.  The usual length of stay after your procedure is about 2 to 3 hours.  This can vary.  If you have any questions, please call our office at [phone], or you may call the cardiac cath lab at [facility] directly at [phone]   Follow-Up: Your physician recommends that you schedule a follow-up appointment in: TO BE DETERMINED.   If you need a refill on your cardiac medications before your next appointment, please call your pharmacy.     Coronary Angiogram With Stent Coronary angiogram with stent placement is a procedure to widen or open a narrow blood vessel of the heart (coronary artery). Arteries may become blocked by cholesterol buildup (plaques) in the lining or wall. When a coronary artery becomes partially blocked, blood flow to that area decreases. This may lead to chest pain or a heart attack (myocardial infarction). A stent is a small piece of metal that looks like mesh or a spring. Stent placement may be done as treatment for a heart attack or right after a coronary angiogram in which a blocked artery is found. Let your health care provider know about:  Any allergies you have.  All medicines you are taking, including vitamins, herbs, eye drops, creams, and over-the-counter medicines.  Any problems you  or family members have had with anesthetic medicines.  Any blood disorders you have.  Any surgeries you have had.  Any medical conditions you have.  Whether you are pregnant or may be pregnant. What are the risks? Generally, this is a safe procedure. However, problems may occur, including:  Damage to the heart or its blood vessels.  A return  of blockage.  Bleeding, infection, or bruising at the insertion site.  A collection of blood under the skin (hematoma) at the insertion site.  A blood clot in another part of the body.  Kidney injury.  Allergic reaction to the dye or contrast that is used.  Bleeding into the abdomen (retroperitoneal bleeding).  What happens before the procedure? Staying hydrated Follow instructions from your health care provider about hydration, which may include:  Up to 2 hours before the procedure - you may continue to drink clear liquids, such as water, clear fruit juice, black coffee, and plain tea.  Eating and drinking restrictions Follow instructions from your health care provider about eating and drinking, which may include:  8 hours before the procedure - stop eating heavy meals or foods such as meat, fried foods, or fatty foods.  6 hours before the procedure - stop eating light meals or foods, such as toast or cereal.  2 hours before the procedure - stop drinking clear liquids.  Ask your health care provider about:  Changing or stopping your regular medicines. This is especially important if you are taking diabetes medicines or blood thinners.  Taking medicines such as ibuprofen. These medicines can thin your blood. Do not take these medicines before your procedure if your health care provider instructs you not to. Generally, aspirin is recommended before a procedure of passing a small, thin tube (catheter) through a blood vessel and into the heart (cardiac catheterization).  What happens during the procedure?  An IV tube will be inserted into one of your veins.  You will be given one or more of the following: ? A medicine to help you relax (sedative). ? A medicine to numb the area where the catheter will be inserted into an artery (local anesthetic).  To reduce your risk of infection: ? Your health care team will wash or sanitize their hands. ? Your skin will be washed with  soap. ? Hair may be removed from the area where the catheter will be inserted.  Using a guide wire, the catheter will be inserted into an artery. The location may be in your groin, in your wrist, or in the fold of your arm (near your elbow).  A type of X-ray (fluoroscopy) will be used to help guide the catheter to the opening of the arteries in the heart.  A dye will be injected into the catheter, and X-rays will be taken. The dye will help to show where any narrowing or blockages are located in the arteries.  A tiny wire will be guided to the blocked spot, and a balloon will be inflated to make the artery wider.  The stent will be expanded and will crush the plaques into the wall of the vessel. The stent will hold the area open and improve the blood flow. Most stents have a drug coating to reduce the risk of the stent narrowing over time.  The artery may be made wider using a drill, laser, or other tools to remove plaques.  When the blood flow is better, the catheter will be removed. The lining of the artery will grow over  the stent, which stays where it was placed. This procedure may vary among health care providers and hospitals. What happens after the procedure?  If the procedure is done through the leg, you will be kept in bed lying flat for about 6 hours. You will be instructed to not bend and not cross your legs.  The insertion site will be checked frequently.  The pulse in your foot or wrist will be checked frequently.  You may have additional blood tests, X-rays, and a test that records the electrical activity of your heart (electrocardiogram, or ECG). This information is not intended to replace advice given to you by your health care provider. Make sure you discuss any questions you have with your health care provider. Document Released: 06/12/2003 Document Revised: 08/05/2016 Document Reviewed: 07/11/2016 Elsevier Interactive Patient Education  2017 [employer].

## 2017-11-30 NOTE — Progress Notes (Signed)
New Outpatient Visit Date: 11/30/2017  Referring Provider: [name], MD [address] [place], [place] [address]  Chief Complaint: Palpitations and chest pain  HPI:  Brian Atkins is a 61 y.o. male who is being seen today for the evaluation of palpitations and chest pain at the request of Dr. Derrel Nip. He has a history of hypertension, marginal zone B-cell lymphoma, and GERD status post Nissen fundoplication. Over the last 1-2 months, Brian Atkins has felt as though his heart is pounding on a frequent basis. Sometimes it also seems to be beating faster than expected. Symptoms are happening every day without clear precipitants. Duration is variable. He notes accompanying lightheadedness. Brian Atkins has also experienced intermittent chest pain over the last few months. He has occasional burning reminiscent of what he experienced prior to his fundoplication. However, he also notices intermittent tightness radiating from his upper abdomen to the neck, which can be quite severe. The discomfort does not seem to be exertional and typically lasts a few minutes at a time. However, while shoveling snow his past week, he felt very short of breath and fatigued with some accompanying chest tightness.  Brian Atkins underwent stress echocardiogram at [facility] in 2016 in anticipation of splenectomy. Patient's wife states that the test was ordered due to history of a "heart attack" that occurred while Brian Atkins was receiving chemotherapy for his lymphoma. It does not appear that he has ever undergone cardiac catheterization.  Brian Atkins also notes that his blood pressure has been more labile and frequently elevated over the last 2 months. When he notices his heart pounding, his blood pressure is often elevated, up to 163/102. When this occurs, he will frequently take an extra dose of carvedilol. He is accompanying headaches from time to time. There have not been any recent medication changes, though Brian Atkins was started on  local lax and winds as approximately 3 months ago due to constipation and slow colonic motility.  --------------------------------------------------------------------------------------------------  Cardiovascular History & Procedures: Cardiovascular Problems:  Palpitations  Atypical chest pain with questionable history of heart attack while receiving chemotherapy  Shortness of breath  Risk Factors:  Hypertension and male gender  Cath/PCI:  None  CV Surgery:  None  EP Procedures and Devices:  None  Non-Invasive Evaluation(s):  Exercise stress echocardiogram (02/26/15, [facility]): Normal exercise stress echocardiogram with LVEF greater than 55% and no wall motion abnormalities.  Recent CV Pertinent Labs: Lab Results  Component Value Date   CHOL 212 (H) 03/02/2013   HDL 27.70 (L) 03/02/2013   LDLCALC 83 10/25/2011   LDLDIRECT 135.8 03/02/2013   TRIG 168.0 (H) 03/02/2013   CHOLHDL 8 03/02/2013   INR 1.0 11/30/2017   K 4.5 11/30/2017   K 3.9 03/28/2014   MG 2.0 03/28/2014   BUN 14 11/30/2017   BUN 12 03/28/2014   CREATININE 0.96 11/30/2017   CREATININE 1.06 07/25/2017    --------------------------------------------------------------------------------------------------  Past Medical History:  Diagnosis Date  . Diverticulitis   . Diverticulosis   . Dyslipidemia   . GERD (gastroesophageal reflux disease)   . Hemorrhoids   . Hiatal hernia   . Labile hypertension   . Lymphoma ([place])   . Medial epicondylitis of right elbow   . Meralgia paresthetica of right side   . Microscopic hematuria     Past Surgical History:  Procedure Laterality Date  . nissen funduplication  5366   [name]  . SPLENECTOMY      Current Meds  Medication Sig  .  benzonatate (TESSALON) 200 MG capsule Take 1 capsule (200 mg total) 3 (three) times daily as needed by mouth.  . bisacodyl (DULCOLAX) 5 MG EC tablet Take 10 mg by mouth.  . carvedilol (COREG) 3.125 MG tablet TAKE 1 TABLET BY  MOUTH 1 TIME DAILY WITH A MEAL. (Patient taking differently: 3.125 mg 2 (two) times daily with a meal. TAKE 1 TABLET BY MOUTH 1 TIME DAILY WITH A MEAL.)  . esomeprazole (NEXIUM) 40 MG capsule Take 40 mg by mouth daily at 12 noon.  . famotidine-calcium carbonate-magnesium hydroxide (PEPCID COMPLETE) 10-800-165 MG CHEW chewable tablet Chew 1 tablet by mouth 4 (four) times daily.  [name] ipratropium (ATROVENT) 0.06 % nasal spray Place 2 sprays into both nostrils 4 (four) times daily.  [name] LINZESS 290 MCG CAPS capsule TAKE 1 CAPSULE (290 MCG TOTAL) BY MOUTH DAILY BEFORE BREAKFAST.  [name] LORazepam (ATIVAN) 1 MG tablet Take 1 tablet (1 mg total) by mouth every 8 (eight) hours as needed.  [name] losartan (COZAAR) 100 MG tablet TAKE 1 TABLET (100 MG TOTAL) BY MOUTH DAILY.  . magnesium oxide (MAG-OX) 400 MG tablet Take 400 mg by mouth daily.  . Melatonin 3 MG TABS Take 1 tablet by mouth at bedtime.   . ondansetron (ZOFRAN) 8 MG tablet Take 1 tablet (8 mg total) by mouth daily as needed. for nausea  . Syringe, Disposable, 3 ML MISC For use with B12 injections weekly/monthly  . triamcinolone cream (KENALOG) 0.5 % Apply 1 application 2 (two) times daily as needed topically.  [name] zolpidem (AMBIEN) 10 MG tablet TAKE 1 TABLET BY MOUTH AT BEDTIME    Allergies: Avelox [moxifloxacin]; Contrast media [iodinated diagnostic agents]; Crestor [rosuvastatin calcium]; Morphine and related; Niacin-simvastatin er; and Tape  Social History   Socioeconomic History  . Marital status: Married    Spouse name: Not on file  . Number of children: 2  . Years of education: 24  . Highest education level: Not on file  Social Needs  . Financial resource strain: Not on file  . Food insecurity - worry: Not on file  . Food insecurity - inability: Not on file  . Transportation needs - medical: Not on file  . Transportation needs - non-medical: Not on file  Occupational History  . Occupation: Shop Printmaker: [employer]    Comment:  [place]  Tobacco Use  . Smoking status: Never Smoker  . Smokeless tobacco: Never Used  Substance and Sexual Activity  . Alcohol use: No  . Drug use: No  . Sexual activity: Yes  Other Topics Concern  . Not on file  Social History Narrative  . Not on file    Family History  Problem Relation Age of Onset  . Coronary artery disease Father 107       CABG  . Aortic aneurysm Father 19       repaired  . Hyperlipidemia Father   . Lung cancer Mother   . Hypertension Mother   . Cancer Mother        bladder  . COPD Mother   . Stomach cancer Maternal Grandfather     Review of Systems: Patient notes intermittent double vision, scheduled for MRI of the brain in the next few weeks [facility]. Otherwise, a 12-system review of systems was performed and was negative except as noted in the HPI.  --------------------------------------------------------------------------------------------------  Physical Exam: BP (!) 160/98 (BP Location: Right Arm, Patient Position: Sitting, Cuff Size: Normal)   Pulse 69  Ht 6\' 2"  (1.88 m)   Wt 202 lb 8 oz (91.9 kg)   BMI 26.00 kg/m   General:  Overweight man, seated comfortably in the exam room. He is accompanied by his wife. HEENT: No conjunctival pallor or scleral icterus. Moist mucous membranes. OP clear. Neck: Supple without lymphadenopathy, thyromegaly, JVD, or HJR. No carotid bruit. Lungs: Normal work of breathing. Clear to auscultation bilaterally without wheezes or crackles. Heart: Regular rate and rhythm without murmurs, rubs, or gallops. Non-displaced PMI. Abd: Bowel sounds present. Soft, NT/ND without hepatosplenomegaly Ext: No lower extremity edema. Radial, PT, and DP pulses are 2+ bilaterally Skin: Warm and dry without rash. Neuro: CNIII-XII intact. Strength and fine-touch sensation intact in upper and lower extremities bilaterally. Psych: Normal mood and affect.  EKG:  Normal sinus rhythm without abnormalities.  Lab Results  Component  Value Date   WBC 8.2 11/30/2017   HGB 15.2 11/30/2017   HCT 44.5 11/30/2017   MCV 88 11/30/2017   PLT 306 11/30/2017    Lab Results  Component Value Date   NA 145 (H) 11/30/2017   K 4.5 11/30/2017   CL 104 11/30/2017   CO2 25 11/30/2017   BUN 14 11/30/2017   CREATININE 0.96 11/30/2017   GLUCOSE 86 11/30/2017   ALT 16 (L) 11/08/2017    Lab Results  Component Value Date   CHOL 212 (H) 03/02/2013   HDL 27.70 (L) 03/02/2013   LDLCALC 83 10/25/2011   LDLDIRECT 135.8 03/02/2013   TRIG 168.0 (H) 03/02/2013   CHOLHDL 8 03/02/2013    --------------------------------------------------------------------------------------------------  ASSESSMENT AND PLAN: Chest pain and shortness of breath There are some atypical features, as the pain is not clearly exertional and comes and goes throughout the day. However, given that Brian Atkins had recurrence of chest pain with accompanying dyspnea and fatigue while shoveling snow earlier this week, I am concerned about significant CAD. Additionally, Brian Atkins and his wife reported a possible history of prior MI. Though exercise stress echocardiogram in 2016 was normal, I believe that further assessment is warranted. We have discussed the risks and benefits of noninvasive testing and cardiac catheterization and have agreed to proceed directly to left heart catheterization. I have reviewed the risks, indications, and alternatives to cardiac catheterization, possible angioplasty, and stenting with the patient. Risks include but are not limited to bleeding, infection, vascular injury, stroke, myocardial infection, arrhythmia, kidney injury, radiation-related injury in the case of prolonged fluoroscopy use, emergency cardiac surgery, and death. The patient understands the risks of serious complication is 1-2 in 4010 with diagnostic cardiac cath and 1-2% or less with angioplasty/stenting. I have encouraged Brian Atkins to continue taking aspirin 81 mg daily and carvedilol  3.125 mg twice a day. We will start isosorbide mononitrate 30 mg daily. I also provided him with a prescription for supple nitroglycerin to be used as needed. If he has worsening chest pain before his catheterization can be scheduled, he was advised to seek immediate medical attention in the emergency department.  Palpitations Predominantly a more forceful heartbeat, though increased heart rate also reported by Brian Atkins. EKG today shows no arrhythmia. If cardiac catheterization is unrevealing, we will consider placement of a Holter monitor.  Headache Currently being evaluated by his other providers. Brian Atkins states that he is scheduled for an MRI of the brain in the coming weeks, given that his lymphoma previously presented in the orbit and that he has intermittently experienced double vision. I have advised Brian Atkins to speak with his  oncologist about expediting MRI given her upcoming catheterization. I will also recheck out to her today that she is aware of plans for cardiac catheterization.  Hypertension Blood pressure is moderately elevated today. We will continue carvedilol 3.125 mg twice a day and losartan 100 mg daily, as well as add isosorbide mononitrate 30 mg daily.  Follow-up: To be determined based on results of cardiac catheterization.  Nelva Bush, MD 12/01/2017 7:09 AM

## 2017-12-01 ENCOUNTER — Encounter: Payer: Self-pay | Admitting: Internal Medicine

## 2017-12-01 ENCOUNTER — Telehealth: Payer: Self-pay | Admitting: *Deleted

## 2017-12-01 DIAGNOSIS — R002 Palpitations: Secondary | ICD-10-CM | POA: Insufficient documentation

## 2017-12-01 DIAGNOSIS — R0602 Shortness of breath: Secondary | ICD-10-CM | POA: Insufficient documentation

## 2017-12-01 LAB — CBC WITH DIFFERENTIAL/PLATELET
BASOS ABS: 0 10*3/uL (ref 0.0–0.2)
Basos: 1 %
EOS (ABSOLUTE): 0.3 10*3/uL (ref 0.0–0.4)
EOS: 4 %
HEMOGLOBIN: 15.2 g/dL (ref 13.0–17.7)
Hematocrit: 44.5 % (ref 37.5–51.0)
Immature Grans (Abs): 0 10*3/uL (ref 0.0–0.1)
Immature Granulocytes: 0 %
Lymphocytes Absolute: 3.1 10*3/uL (ref 0.7–3.1)
Lymphs: 38 %
MCH: 30.2 pg (ref 26.6–33.0)
MCHC: 34.2 g/dL (ref 31.5–35.7)
MCV: 88 fL (ref 79–97)
MONOCYTES: 11 %
MONOS ABS: 0.9 10*3/uL (ref 0.1–0.9)
Neutrophils Absolute: 3.8 10*3/uL (ref 1.4–7.0)
Neutrophils: 46 %
PLATELETS: 306 10*3/uL (ref 150–379)
RBC: 5.04 x10E6/uL (ref 4.14–5.80)
RDW: 14.6 % (ref 12.3–15.4)
WBC: 8.2 10*3/uL (ref 3.4–10.8)

## 2017-12-01 LAB — BASIC METABOLIC PANEL
BUN / CREAT RATIO: 15 (ref 10–24)
BUN: 14 mg/dL (ref 8–27)
CO2: 25 mmol/L (ref 20–29)
CREATININE: 0.96 mg/dL (ref 0.76–1.27)
Calcium: 9.5 mg/dL (ref 8.6–10.2)
Chloride: 104 mmol/L (ref 96–106)
GFR calc Af Amer: 98 mL/min/{1.73_m2} (ref >59)
GFR calc non Af Amer: 85 mL/min/{1.73_m2} (ref >59)
Glucose: 86 mg/dL (ref 65–99)
Potassium: 4.5 mmol/L (ref 3.5–5.2)
Sodium: 145 mmol/L — ABNORMAL HIGH (ref 134–144)

## 2017-12-01 LAB — PROTIME-INR
INR: 1 (ref 0.8–1.2)
PROTHROMBIN TIME: 10.1 s (ref 9.1–12.0)

## 2017-12-01 MED ORDER — PREDNISONE 50 MG PO TABS: ORAL_TABLET | ORAL | 0 refills | Status: DC

## 2017-12-01 MED ORDER — DIPHENHYDRAMINE HCL 25 MG PO TABS: ORAL_TABLET | ORAL | 0 refills | Status: DC

## 2017-12-01 NOTE — Telephone Encounter (Signed)
-----   Message from Nelva Bush, MD sent at 12/01/2017  1:09 PM EST ----- Regarding: RE: Cath premeds That is perfect. Thanks for your help.  Gerald Stabs  ----- Message ----- From: Vanessa Ralphs, RN Sent: 12/01/2017  10:09 AM To: Nelva Bush, MD Subject: RE: Cath premeds                               Absolutely! The protocol I have is as follows:  Elective Cases: 1. Prednisone 50 mg by mouth at 13 hours, 7 hours, and 1 hour prior to procedure. 2. Benadryl 50 mg by mouth 1 hour prior to procedure.   Is this ok by you?  Thanks, Danise Mina  ----- Message ----- From: Nelva Bush, MD Sent: 12/01/2017   9:24 AM To: Vanessa Ralphs, RN Subject: Cath premeds                                   Claris Gladden,  I forgot to mention that Mr. Szafranski mentioned prior contrast reaction with CT's. Do you mind making sure that he gets prednisone/Benadryl premedication for his upcoming cath? Thanks.  Gerald Stabs

## 2017-12-01 NOTE — Telephone Encounter (Signed)
Called and s/w with wife, Brian Atkins. She verbalized understanding on how to take prednisone and benadryl prior to procedure at 08:30 am on 12/07/17. Rx sent to pharmacy. She also verbalized understanding of results of lab work from yesterday 11/30/17.

## 2017-12-02 ENCOUNTER — Telehealth: Payer: Self-pay | Admitting: Internal Medicine

## 2017-12-02 NOTE — Telephone Encounter (Signed)
S/w patient. Last night BP was 160/98. He took his first does of Imdur 30 mg last night along with losartan 100 mg, and his evening dose of carvedilol.  This afternoon BP decreased to 104/60. He feels dizzy, shortness of breath is at baseline and occasional palpitations. Denies chest pain. He did take morning carvedilol.  S/w with Ignacia Bayley, NP. He advised for patient to hold all 3 BP medications tonight if SBP<120. Patient may take Imdur and carvedilol, hold losartan if SBP >120.   Patient verbalized understanding of instructions and to continue to monitor BP. If symptomatic, patient instructed to call on call physician or go to the emergency.

## 2017-12-02 NOTE — Telephone Encounter (Signed)
Pt has a question regarding Imdur. States his BP yesterday,  was 160/98, this afternoon it was 100/60. He asks should he go ahead and take his BP this afternoon. Please call and advise.

## 2017-12-05 ENCOUNTER — Telehealth: Payer: Self-pay | Admitting: Internal Medicine

## 2017-12-05 DIAGNOSIS — C8599 Non-Hodgkin lymphoma, unspecified, extranodal and solid organ sites: Secondary | ICD-10-CM | POA: Diagnosis not present

## 2017-12-05 DIAGNOSIS — H532 Diplopia: Secondary | ICD-10-CM | POA: Diagnosis not present

## 2017-12-05 MED ORDER — ISOSORBIDE MONONITRATE ER 30 MG PO TB24: 15.0000 mg | ORAL_TABLET | Freq: Every day | ORAL | Status: DC

## 2017-12-05 NOTE — Telephone Encounter (Signed)
He had both of these symptoms before starting Imdur. If they have worsened with the medication, he can cut down to 15 mg daily.

## 2017-12-05 NOTE — Telephone Encounter (Signed)
Previously routed to [name].

## 2017-12-05 NOTE — Telephone Encounter (Signed)
Please route to treating provider.

## 2017-12-05 NOTE — Telephone Encounter (Signed)
Pt was started on Imdur, and has since then had bad headaches and nausea. States his BP hasnt dropped at all. States he is still averaging 150-160/95-105. HR 65-75. Please call.

## 2017-12-05 NOTE — Telephone Encounter (Signed)
I spoke with the patient. He states that he has been taking: 1) Coreg 3.125 mg BID 2) Losartan 100 mg qhs 3) Imdur 30 mg qhs  BP's are reported running 150-170's/ 90-103. These readings are on his current medications. He did have one low SBP reading on Friday of 104. He did have some dizziness associated with this, but this was the only time he has had this since starting his medications.  Imdur was started on 11/30/17. The patient reports nausea/ severe headaches. He is taking advil/ tylenol with no relief.  He is currently scheduled for a cardiac cath on 12/07/17.   I advised the patient I will need to review his BP and meds with the provider and call him back.  He voices understanding. He states he is having an MRI done today so he asked that we call his wife, Brian Atkins, back with any recommendations.  Contact # [phone].

## 2017-12-05 NOTE — Telephone Encounter (Signed)
BP are elevated, but isosorbide mononitrate was just started last week. I recommend that he continue his current medications. We will discuss need for further adjustments following his cardiac catheterization on Wednesday (12/07/17).  Nelva Bush, MD [facility] Pager: [phone]

## 2017-12-05 NOTE — Telephone Encounter (Signed)
I spoke with Vicente Males, and advised her of Dr. Darnelle Bos recommendations.  She states he was having some headaches prior to starting imdur, but not migraine type headaches like he is having now.  I advised her to try to have the patient take imdur 30 mg - 1/2 tablet (15 mg) once daily at least through the time of his heart cath then this can be re-evaluated.  She is agreeable and will notify the patient.

## 2017-12-05 NOTE — Telephone Encounter (Signed)
With the nausea and headaches (not relieved with advil/ tylenol) do you want him to continue imdur 30 mg at the present dose or try cutting down to 15 mg daily to see if he can tolerate?

## 2017-12-07 ENCOUNTER — Telehealth: Payer: Self-pay | Admitting: Cardiology

## 2017-12-07 ENCOUNTER — Ambulatory Visit
Admission: RE | Admit: 2017-12-07 | Discharge: 2017-12-07 | Disposition: A | Discharge status: Home / Self Care | Payer: Commercial Managed Care - HMO | Source: Ambulatory Visit | Attending: Internal Medicine | Admitting: Internal Medicine

## 2017-12-07 ENCOUNTER — Encounter
Admission: RE | Disposition: A | Discharge status: Home / Self Care | Payer: Self-pay | Source: Ambulatory Visit | Attending: Internal Medicine

## 2017-12-07 DIAGNOSIS — R0789 Other chest pain: Secondary | ICD-10-CM | POA: Insufficient documentation

## 2017-12-07 DIAGNOSIS — E785 Hyperlipidemia, unspecified: Secondary | ICD-10-CM | POA: Insufficient documentation

## 2017-12-07 DIAGNOSIS — Z8572 Personal history of non-Hodgkin lymphomas: Secondary | ICD-10-CM | POA: Diagnosis not present

## 2017-12-07 DIAGNOSIS — R079 Chest pain, unspecified: Secondary | ICD-10-CM | POA: Diagnosis not present

## 2017-12-07 DIAGNOSIS — R51 Headache: Secondary | ICD-10-CM | POA: Diagnosis not present

## 2017-12-07 DIAGNOSIS — K219 Gastro-esophageal reflux disease without esophagitis: Secondary | ICD-10-CM | POA: Insufficient documentation

## 2017-12-07 DIAGNOSIS — Z9221 Personal history of antineoplastic chemotherapy: Secondary | ICD-10-CM | POA: Insufficient documentation

## 2017-12-07 DIAGNOSIS — I2510 Atherosclerotic heart disease of native coronary artery without angina pectoris: Secondary | ICD-10-CM | POA: Insufficient documentation

## 2017-12-07 DIAGNOSIS — Z79899 Other long term (current) drug therapy: Secondary | ICD-10-CM | POA: Insufficient documentation

## 2017-12-07 DIAGNOSIS — R002 Palpitations: Secondary | ICD-10-CM | POA: Diagnosis not present

## 2017-12-07 DIAGNOSIS — I10 Essential (primary) hypertension: Secondary | ICD-10-CM | POA: Diagnosis not present

## 2017-12-07 DIAGNOSIS — R0602 Shortness of breath: Secondary | ICD-10-CM | POA: Diagnosis not present

## 2017-12-07 DIAGNOSIS — K5900 Constipation, unspecified: Secondary | ICD-10-CM | POA: Diagnosis not present

## 2017-12-07 HISTORY — PX: INTRAVASCULAR PRESSURE WIRE/FFR STUDY: CATH118243

## 2017-12-07 HISTORY — PX: LEFT HEART CATH AND CORONARY ANGIOGRAPHY: CATH118249

## 2017-12-07 LAB — POCT ACTIVATED CLOTTING TIME
Activated Clotting Time: 224 seconds
Activated Clotting Time: 246 seconds

## 2017-12-07 SURGERY — LEFT HEART CATH AND CORONARY ANGIOGRAPHY
Anesthesia: Moderate Sedation

## 2017-12-07 MED ORDER — SODIUM CHLORIDE 0.9% FLUSH: 3.0000 mL | Freq: Two times a day (BID) | INTRAVENOUS | Status: DC

## 2017-12-07 MED ORDER — IOPAMIDOL (ISOVUE-300) INJECTION 61%
INTRAVENOUS | Status: DC | PRN
  Administered 2017-12-07: 115 mL via INTRA_ARTERIAL

## 2017-12-07 MED ORDER — SODIUM CHLORIDE 0.9% FLUSH: 3.0000 mL | INTRAVENOUS | Status: DC | PRN

## 2017-12-07 MED ORDER — HEPARIN SODIUM (PORCINE) 1000 UNIT/ML IJ SOLN
INTRAMUSCULAR | Status: AC
  Filled 2017-12-07: qty 1

## 2017-12-07 MED ORDER — SODIUM CHLORIDE 0.9 % IV SOLN
250.0000 mL | INTRAVENOUS | Status: DC | PRN
Start: 2017-12-07 — End: 2017-12-07

## 2017-12-07 MED ORDER — VERAPAMIL HCL 2.5 MG/ML IV SOLN
INTRAVENOUS | Status: AC
  Filled 2017-12-07: qty 2

## 2017-12-07 MED ORDER — ASPIRIN 81 MG PO CHEW
81.0000 mg | CHEWABLE_TABLET | ORAL | Status: AC
  Administered 2017-12-07: 81 mg via ORAL

## 2017-12-07 MED ORDER — NITROGLYCERIN 5 MG/ML IV SOLN
INTRAVENOUS | Status: AC
  Filled 2017-12-07: qty 10

## 2017-12-07 MED ORDER — ADENOSINE (DIAGNOSTIC) 140MCG/KG/MIN
INTRAVENOUS | Status: DC | PRN
  Administered 2017-12-07: 140 ug/kg/min via INTRAVENOUS

## 2017-12-07 MED ORDER — MIDAZOLAM HCL 2 MG/2ML IJ SOLN
INTRAMUSCULAR | Status: AC
Start: 2017-12-07 — End: 2017-12-07
  Filled 2017-12-07: qty 2

## 2017-12-07 MED ORDER — HYDRALAZINE HCL 20 MG/ML IJ SOLN: 5.0000 mg | INTRAMUSCULAR | Status: DC | PRN

## 2017-12-07 MED ORDER — SODIUM CHLORIDE 0.9 % WEIGHT BASED INFUSION
3.0000 mL/kg/h | INTRAVENOUS | Status: AC
  Administered 2017-12-07: 3 mL/kg/h via INTRAVENOUS

## 2017-12-07 MED ORDER — HEPARIN (PORCINE) IN NACL 2-0.9 UNIT/ML-% IJ SOLN
INTRAMUSCULAR | Status: AC
  Filled 2017-12-07: qty 500

## 2017-12-07 MED ORDER — LIDOCAINE HCL (PF) 1 % IJ SOLN
INTRAMUSCULAR | Status: AC
  Filled 2017-12-07: qty 30

## 2017-12-07 MED ORDER — ATORVASTATIN CALCIUM 20 MG PO TABS: 20.0000 mg | ORAL_TABLET | Freq: Every day | ORAL | 11 refills | Status: DC

## 2017-12-07 MED ORDER — NITROGLYCERIN 1 MG/10 ML FOR IR/CATH LAB
INTRA_ARTERIAL | Status: DC | PRN
  Administered 2017-12-07: 200 ug via INTRACORONARY

## 2017-12-07 MED ORDER — MIDAZOLAM HCL 2 MG/2ML IJ SOLN
INTRAMUSCULAR | Status: DC | PRN
  Administered 2017-12-07: 2 mg via INTRAVENOUS

## 2017-12-07 MED ORDER — LABETALOL HCL 5 MG/ML IV SOLN
INTRAVENOUS | Status: AC
  Filled 2017-12-07: qty 4

## 2017-12-07 MED ORDER — ASPIRIN 81 MG PO CHEW
CHEWABLE_TABLET | ORAL | Status: AC
  Filled 2017-12-07: qty 1

## 2017-12-07 MED ORDER — FENTANYL CITRATE (PF) 100 MCG/2ML IJ SOLN
INTRAMUSCULAR | Status: DC | PRN
  Administered 2017-12-07: 25 ug via INTRAVENOUS
  Administered 2017-12-07: 50 ug via INTRAVENOUS

## 2017-12-07 MED ORDER — LABETALOL HCL 5 MG/ML IV SOLN
10.0000 mg | INTRAVENOUS | Status: DC | PRN
  Administered 2017-12-07: 10 mg via INTRAVENOUS

## 2017-12-07 MED ORDER — ADENOSINE (DIAGNOSTIC) 3 MG/ML IV SOLN
INTRAVENOUS | Status: AC
  Filled 2017-12-07: qty 30

## 2017-12-07 MED ORDER — FUROSEMIDE 20 MG PO TABS: 20.0000 mg | ORAL_TABLET | Freq: Every day | ORAL | 11 refills | Status: DC

## 2017-12-07 MED ORDER — HEPARIN SODIUM (PORCINE) 1000 UNIT/ML IJ SOLN
INTRAMUSCULAR | Status: DC | PRN
  Administered 2017-12-07 (×2): 4500 [IU] via INTRAVENOUS
  Administered 2017-12-07: 3000 [IU] via INTRAVENOUS

## 2017-12-07 MED ORDER — FENTANYL CITRATE (PF) 100 MCG/2ML IJ SOLN
INTRAMUSCULAR | Status: AC
  Filled 2017-12-07: qty 2

## 2017-12-07 MED ORDER — CARVEDILOL 6.25 MG PO TABS: 6.2500 mg | ORAL_TABLET | Freq: Two times a day (BID) | ORAL | 11 refills | Status: DC

## 2017-12-07 MED ORDER — SODIUM CHLORIDE 0.9 % WEIGHT BASED INFUSION: 1.0000 mL/kg/h | INTRAVENOUS | Status: DC

## 2017-12-07 MED ORDER — SODIUM CHLORIDE 0.9 % IV SOLN: 250.0000 mL | INTRAVENOUS | Status: DC | PRN

## 2017-12-07 SURGICAL SUPPLY — 13 items
CATH 5F 110X4 TIG (CATHETERS) ×2 IMPLANT
CATH INFINITI 5 FR JL3.5 (CATHETERS) ×3 IMPLANT
CATH INFINITI 5FR ANG PIGTAIL (CATHETERS) ×2 IMPLANT
CATH VISTA GUIDE 6FR JR4 (CATHETERS) ×2 IMPLANT
CATH VISTA GUIDE 6FR XBLAD3.5 (CATHETERS) ×3 IMPLANT
DEVICE INFLAT 30 PLUS (MISCELLANEOUS) ×3 IMPLANT
DEVICE RAD TR BAND REGULAR (VASCULAR PRODUCTS) ×3 IMPLANT
GLIDESHEATH SLEND SS 6F .021 (SHEATH) ×3 IMPLANT
KIT MANI 3VAL PERCEP (MISCELLANEOUS) ×4 IMPLANT
PACK CARDIAC CATH (CUSTOM PROCEDURE TRAY) ×4 IMPLANT
WIRE HITORQ VERSACORE ST 145CM (WIRE) ×2 IMPLANT
WIRE PRESSURE VERRATA (WIRE) ×2 IMPLANT
WIRE ROSEN-J .035X260CM (WIRE) ×3 IMPLANT

## 2017-12-07 NOTE — Telephone Encounter (Signed)
The patient called the answering service with concerns for increased BP and heart rate after having cath today. I called him. He says that his BP is elevated at 175/112 with heart rate of 101. During his post cath time he had been given extra antihypertensive. [name] doubled his carvedilol and added lasix.  The patient got home just took his meds about an hour ago. His cath site is stable. He has no chest discomfort.  We discussed that he has not had much oral intake. He agrees to drink a few cups of water and eat dinner. He will relax and give his meds some time to work. He will call the service back later if he feels bad and/or his BP does not improve.   Brian Atkins, AGNP-C [facility] 12/07/2017  6:31 PM

## 2017-12-07 NOTE — Brief Op Note (Signed)
BRIEF CARDIAC CATHETERIZATION NOTE  DATE: 12/07/2017 TIME: 10:10 AM  PATIENT:  Brian Atkins  61 y.o. male  PRE-OPERATIVE DIAGNOSIS:  Atypical chest pain, shortness of breath, and hypertension  POST-OPERATIVE DIAGNOSIS:  Non-obstructive coronary artery disease and diastolic dysfunction.  PROCEDURE:  Procedure(s): LEFT HEART CATH AND CORONARY ANGIOGRAPHY (Left) INTRAVASCULAR PRESSURE WIRE/FFR STUDY (N/A)  SURGEON:  Surgeon(s) and Role:    * Rhyen Mazariego, MD - Primary  FINDINGS: 1. Moderate, non-obstructive coronary artery disease involving the mid LAD and mid RCA. 2. Hyperdynamic left ventricular contraction. 3. Moderately elevated left ventricular filling pressure.  RECOMMENDATIONS: 1. Medical therapy and risk factor modification to prevent progression of CAD: increase carvedilol to 6.25 mg BID and start atorvastatin 20 mg daily. 2. Start furosemide 20 mg daily with BMP in 1-2 weeks. 3. Obtain transthoracic echocardiogram and f/u in clinic in 1 months.  Nelva Bush, MD [facility] Pager: [phone]

## 2017-12-07 NOTE — Interval H&P Note (Signed)
History and Physical Interval Note:  12/07/2017 8:08 AM  Brian Atkins  has presented today for cardiac catheterization, with the diagnosis of chest pain, shortness of breath, and hypertension. The various methods of treatment have been discussed with the patient and family. After consideration of risks, benefits and other options for treatment, the patient has consented to  Procedure(s): LEFT HEART CATH AND CORONARY ANGIOGRAPHY (Left) as a surgical intervention .  The patient's history has been reviewed, patient examined, no change in status, stable for surgery.  I have reviewed the patient's chart and labs.  Questions were answered to the patient's satisfaction.    Cath Lab Visit (complete for each Cath Lab visit)  Clinical Evaluation Leading to the Procedure:   ACS: No.  Non-ACS:    Anginal Classification: CCS IV  Anti-ischemic medical therapy: Maximal Therapy (2 or more classes of medications)  Non-Invasive Test Results: No non-invasive testing performed  Prior CABG: No previous CABG  Brian Atkins

## 2017-12-08 ENCOUNTER — Telehealth: Payer: Self-pay | Admitting: *Deleted

## 2017-12-08 ENCOUNTER — Encounter: Payer: Self-pay | Admitting: Internal Medicine

## 2017-12-08 DIAGNOSIS — Z5181 Encounter for therapeutic drug level monitoring: Secondary | ICD-10-CM

## 2017-12-08 DIAGNOSIS — Z79899 Other long term (current) drug therapy: Secondary | ICD-10-CM

## 2017-12-08 DIAGNOSIS — I5032 Chronic diastolic (congestive) heart failure: Secondary | ICD-10-CM

## 2017-12-08 NOTE — Telephone Encounter (Signed)
Called patient. He verbalized understanding of Dr Darnelle Bos recommendations and plan of care.

## 2017-12-08 NOTE — Telephone Encounter (Signed)
S/w patient's wife, ok per DPR. Follow up appointment with Dr End scheduled for 01/18/18.  She verbalized understanding that patient needed BMP in about 1 week at the [facility] as well that patient needed echocardiogram before follow up appointment. She could not schedule echo appointment at this time but will call back to schedule.   She stated patient had a difficult night with elevated BP and HR. There is a note in Epic from all-call provider. She says this morning patient is better, BP and HR are back down. She had a another concern about the patient being on increased dose of furosemide and if it would have a negative effect with the Linzess the patient takes. Advised I will route to Dr End for advice.

## 2017-12-08 NOTE — Telephone Encounter (Signed)
Received incoming call from patient's wife. Gave her Dr Darnelle Bos recommendations as well and she verbalized understanding.

## 2017-12-08 NOTE — Telephone Encounter (Signed)
I am glad to hear that Brian Atkins is feeling better this morning and that his heart rate/blood pressure are better controlled.  I do not see any evidence of interaction between Linzess and furosemide.  Brian Atkins and his wife were concerned about the possibility for dehydration with addition of furosemide.  However, his cardiac catheterization yesterday showed significantly elevated left ventricular filling pressure that is likely contributing to Brian Atkins' symptoms.  I recommend that he start furosemide 20 mg daily; this is a low dose and is unlikely to cause marked dehydration.  He should limit his salt intake as much as possible.  We will follow-up with labs, echo, and office visit, as previously noted.  Nelva Bush, MD [facility] Pager: [phone]

## 2017-12-08 NOTE — Telephone Encounter (Signed)
-----   Message from Nelva Bush, MD sent at 12/07/2017  6:59 PM EST ----- Regarding: Echo Hi Seraj Dunnam,  Can you help arrange for an echo for Mr. Atiyeh before he returns to see me in about a month for chronic diastolic heart failure? Thanks.  Gerald Stabs

## 2017-12-14 ENCOUNTER — Telehealth: Payer: Self-pay | Admitting: Internal Medicine

## 2017-12-14 NOTE — Telephone Encounter (Signed)
Spoke with the patient. He is s/p cath from 12/07/17 with [name]: Conclusions: 1. Moderate, non-obstructive coronary artery disease involving the proximal/mid LAD and mid RCA. Both lesions are not hemodynamically significant by FFR. 2. Hyperdynamic left ventricular contraction (LVEF >65%). 3. Moderately elevated left ventricular filling pressure, consistent with diastolic dysfunction.  Recommendations: 1. Medical therapy and risk factor modification to prevent progression of CAD. Will increase carvedilol to 6.25 mg BID and add atorvastatin 20 mg daily. 2. Initiate furosemide 20 mg daily and repeat BMP in ~2 weeks. 3. Consider transthoracic echo to evaluate for other structural abnormalities.   He called today with reports of lightheadedness and occular migraines that are occurring 1-2 x per day since Thursday or Friday of last week.  He was having some non-migraine type headaches prior to seeing [name] and had an MRI for this at [facility] which he reports was ok. He was on imdur pre-cath with severe headaches and nausea. This was discontinued post cath. He states he has been urinating very frequently and large amounts. He is also trying to stay hydrated as well and keeps a bottle of water with him. His main concern is that post cath his headaches have turned migraine in nature. I advised him I am uncertain if one of his medications is causing this to occur, but will need to review with [name] his PA.  BP's are reported to be 130-140/100 even when lightheaded.  HR's are 60-70's.  I asked if the lightheadedness/ headache occur at the same time and he felt that the lightheadedness was a precurser to the migraine. He is aware I will be back in touch with him once recommendations are received from the provider.  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

## 2017-12-14 NOTE — Telephone Encounter (Signed)
Pt calling stating he had cath done last week and has some questions about medications he was placed on  He's having headaches more along with lightheadedness and ocular migraines sometimes twice a day he states   Would like to know if this is related to the new medication we placed him on   Please advise

## 2017-12-14 NOTE — Telephone Encounter (Signed)
I left a message for the patient to call. 

## 2017-12-14 NOTE — Telephone Encounter (Signed)
I spoke with the patient and his wife. They are aware of Thurmond Butts, PA's recommendations to hold the lipitor first to see if this is contributing to his headaches.  I have advised him to call back Friday afternoon if no improvement at all.  He states he has not taken any of the SL NTG at all.  He is agreeable with [name]'s recommendations and will call back if needed.

## 2017-12-14 NOTE — Telephone Encounter (Signed)
His frequent urination is secondary to the Lasix which was started given his markedly elevated LV filling pressure. He should remain on this medication with planned follow up labs per [name]. Regarding his headache, his new cardiac medications include Lipitor and sublingual nitro. He has been on Coreg, losartan, and Lasix in the past without issues. He was on Imdur prior to his cardiac cath, though this has since been discontinued. He could have a trial of stopping the Lipitor and see if this helps with his headache. Make sure he is not taking SL NTG. Follow up with MD.

## 2017-12-14 NOTE — Telephone Encounter (Signed)
I agree with recommendations by [name], PA. Headaches predate cath and addition to Imdur (though it sounds like they may have changed somewhat in frequency and quality). He should continue with furosemide, as I do not believe that this is contributing to his headaches. Frequent urination is a normal effect of furosemide and indicates that it is working to improve his fluid retention. We will follow-up as planned in the office. If headaches remain a problem, I suggest that he speak with his PCP or seek the advice of a neurologist.  Nelva Bush, MD [place] Pager: [phone]

## 2017-12-19 ENCOUNTER — Other Ambulatory Visit
Admission: RE | Admit: 2017-12-19 | Discharge: 2017-12-19 | Disposition: A | Discharge status: Home / Self Care | Payer: 59 | Source: Ambulatory Visit | Attending: Internal Medicine | Admitting: Internal Medicine

## 2017-12-19 DIAGNOSIS — Z5181 Encounter for therapeutic drug level monitoring: Secondary | ICD-10-CM | POA: Insufficient documentation

## 2017-12-19 DIAGNOSIS — I5032 Chronic diastolic (congestive) heart failure: Secondary | ICD-10-CM | POA: Diagnosis not present

## 2017-12-19 DIAGNOSIS — Z79899 Other long term (current) drug therapy: Secondary | ICD-10-CM | POA: Diagnosis not present

## 2017-12-19 LAB — BASIC METABOLIC PANEL
Anion gap: 7 (ref 5–15)
BUN: 21 mg/dL — AB (ref 6–20)
CALCIUM: 9.1 mg/dL (ref 8.9–10.3)
CHLORIDE: 105 mmol/L (ref 101–111)
CO2: 29 mmol/L (ref 22–32)
CREATININE: 1.2 mg/dL (ref 0.61–1.24)
GFR calc non Af Amer: >60 mL/min (ref >60)
GLUCOSE: 84 mg/dL (ref 65–99)
Potassium: 4.4 mmol/L (ref 3.5–5.1)
Sodium: 141 mmol/L (ref 135–145)

## 2017-12-29 ENCOUNTER — Ambulatory Visit (INDEPENDENT_AMBULATORY_CARE_PROVIDER_SITE_OTHER): Payer: 59

## 2017-12-29 ENCOUNTER — Other Ambulatory Visit: Payer: Self-pay

## 2017-12-29 DIAGNOSIS — I5032 Chronic diastolic (congestive) heart failure: Secondary | ICD-10-CM | POA: Diagnosis not present

## 2018-01-02 ENCOUNTER — Telehealth: Payer: Self-pay | Admitting: Internal Medicine

## 2018-01-02 DIAGNOSIS — M6289 Other specified disorders of muscle: Secondary | ICD-10-CM | POA: Diagnosis not present

## 2018-01-02 DIAGNOSIS — K5902 Outlet dysfunction constipation: Secondary | ICD-10-CM | POA: Diagnosis not present

## 2018-01-02 DIAGNOSIS — R531 Weakness: Secondary | ICD-10-CM | POA: Diagnosis not present

## 2018-01-02 MED ORDER — CARVEDILOL 12.5 MG PO TABS: 12.5000 mg | ORAL_TABLET | Freq: Two times a day (BID) | ORAL | 3 refills | Status: DC

## 2018-01-02 NOTE — Telephone Encounter (Signed)
S/w patient and wife. Patient had episode today at PT in [facility]. BP 178/116, HR 72. Patient felt headache come on, seeing "white lights" and felt like heart pounding. Wife states this just comes on him randomly and unannounced.   Patient reports BP at home has been the following: 01/10 BP 173/105, HR 89 01/13 BP 165/103, HR 69 at 5 pm  BP 167/102, HR 66 at 9:50 pm.  S/w [name], PA who reviewed patient's chart. Advised to increase carvedilol to 12.5 mg by mouth two times a day.  Patient and wife verbalized understanding. We discussed possibility of neurology consult and evaluation by PCP. I encouraged them to contact Dr Derrel Nip. Offered to reschedule appointment to 01/04/18 from 01/18/18. They agreed and will come see Dr End on 01/04/18 at 10 am.

## 2018-01-02 NOTE — Telephone Encounter (Signed)
Pt wife calling stating pt had an appointment today with GI at [place]  They wouldn't do the physical therapy for his BP was 178/116   She was advised to call us   Would like a call back

## 2018-01-03 NOTE — Telephone Encounter (Signed)
Thanks for the update. We will discuss evaluation and treatment options further at tomorrow's visit.

## 2018-01-04 ENCOUNTER — Ambulatory Visit (INDEPENDENT_AMBULATORY_CARE_PROVIDER_SITE_OTHER): Payer: 59 | Admitting: Internal Medicine

## 2018-01-04 ENCOUNTER — Encounter: Payer: Self-pay | Admitting: Internal Medicine

## 2018-01-04 ENCOUNTER — Other Ambulatory Visit: Payer: Self-pay | Admitting: *Deleted

## 2018-01-04 VITALS — BP 110/60 | HR 64 | Ht 74.0 in | Wt 198.0 lb

## 2018-01-04 DIAGNOSIS — I25708 Atherosclerosis of coronary artery bypass graft(s), unspecified, with other forms of angina pectoris: Secondary | ICD-10-CM

## 2018-01-04 DIAGNOSIS — R519 Headache, unspecified: Secondary | ICD-10-CM

## 2018-01-04 DIAGNOSIS — R51 Headache: Secondary | ICD-10-CM

## 2018-01-04 DIAGNOSIS — E782 Mixed hyperlipidemia: Secondary | ICD-10-CM

## 2018-01-04 DIAGNOSIS — I10 Essential (primary) hypertension: Secondary | ICD-10-CM

## 2018-01-04 MED ORDER — ATORVASTATIN CALCIUM 20 MG PO TABS: 20.0000 mg | ORAL_TABLET | Freq: Every day | ORAL | 3 refills | Status: DC

## 2018-01-04 NOTE — Patient Instructions (Addendum)
Medication Instructions:  Your physician has recommended you make the following change in your medication:  1- RESTART Lipitor 20 mg by mouth once a day.   Labwork: Your physician recommends that you return for lab work in: [place] at [facility].  - Please go to the [facility]. You will check in at the front desk to the right as you walk into the atrium. Valet Parking is offered if needed.  - 24 hour urine collection: Keep urine on ice for 24 hours then return to lab. - Lab will provide you with instructions as well.     Testing/Procedures: none  Follow-Up: Your physician recommends that you schedule a follow-up appointment in: 3 MONTHS WITH DR END.   If you need a refill on your cardiac medications before your next appointment, please call your pharmacy.  24-Hour Urine Collection How do I do a 24-hour urine collection?  When you get up in the morning, urinate in the toilet and flush. Write down the time. This will be your start time on the day of collection and your end time on the next morning.  From then on, collect all of your urine in the plastic jug that is given to you.  Stop collecting your urine 24 hours after you started.  If the plastic jug that is given to you already has liquid in it, that is okay. Do not throw out the liquid or rinse out the jug. Some tests need the liquid to be added to your urine.  Keep your plastic jug cool in an ice chest or keep it in the refrigerator during the test.  When 24 hours are over, bring your plastic jug to the clinic lab. Keep the jug cool in an ice chest while you are bringing it to the lab. This information is not intended to replace advice given to you by your health care provider. Make sure you discuss any questions you have with your health care provider. Document Released: 03/04/2009 Document Revised: 08/09/2016 Document Reviewed: 05/01/2014 Elsevier Interactive Patient Education  [employer].

## 2018-01-04 NOTE — Progress Notes (Signed)
Follow-up Outpatient Visit Date: 01/04/2018  Primary Care Provider: [name], MD [address] [place] [address]  Chief Complaint: Headaches and elevated blood pressure  HPI:  [name] is a 62 y.o. year-old male with history of non-obstructive coronary artery disease by catheterization in 11/2017, hypertension, marginal zone B-cell lymphoma, and GERD status post Nissen fundoplication, who presents for follow-up of headaches and elevated blood pressure.  I last saw [name] on 11/30/17, at which time he noted elevated blood pressure, palpitations, headaches, and chest pain. He subsequently underwent left heart catheterization, which revealed moderate CAD involving the LAD and RCA (neither lesion was positive by FFR). Left ventricular filling pressures were elevated suggestive of diastolic dysfunction.  He is now on carvedilol 25 mg twice daily, furosemide 20 mg daily, and losartan 100 mg daily.  He feels like his breathing may be a little bit better.  He continues to have occasional chest tightness that seems to go along with his headaches and elevated blood pressure.  Blood pressures at home are labile and typically elevated, up to 165/110 in the setting of his headaches.  Headaches are frequently accompanied by vision changes including floaters and flashes.  Mr. Nieblas was concerned that atorvastatin, started after his catheterization, could have been contributing to his headaches.  Since holding this for the last few weeks, he has not noticed any change in his headaches.  He has never been evaluated by a neurologist.  Of note, MRI brain prior to our catheterization did not show any significant intracranial abnormalities, given that his lymphoma initially presented behind the eyes.  --------------------------------------------------------------------------------------------------  Cardiovascular History & Procedures: Cardiovascular Problems:  Palpitations  Atypical  chest pain with questionable history of heart attack while receiving chemotherapy  Shortness of breath  Risk Factors:  Known CAD, hypertension, and male gender  Cath/PCI:  LHC (12/07/17): LMCA normal.  LAD with 20% ostial and 60% mid vessel disease (FFR 0.84).  Small ramus branch with 50% ostial stenosis.  LCx normal.  RCA with 50% mid vessel disease (FFR 0.94).  CV Surgery:  None  EP Procedures and Devices:  None  Non-Invasive Evaluation(s):  TTE (12/29/17): Normal LV size with mild LVH.  LVEF 60-65% with normal wall motion.  Mild left atrial enlargement.  Exercise stress echocardiogram (02/26/15, [facility]): Normal exercise stress echocardiogram with LVEF greater than 55% and no wall motion abnormalities.  Recent CV Pertinent Labs: Lab Results  Component Value Date   CHOL 212 (H) 03/02/2013   HDL 27.70 (L) 03/02/2013   LDLCALC 83 10/25/2011   LDLDIRECT 135.8 03/02/2013   TRIG 168.0 (H) 03/02/2013   CHOLHDL 8 03/02/2013   INR 1.0 11/30/2017   K 4.4 12/19/2017   K 3.9 03/28/2014   MG 2.0 03/28/2014   BUN 21 (H) 12/19/2017   BUN 14 11/30/2017   BUN 12 03/28/2014   CREATININE 1.20 12/19/2017   CREATININE 1.06 07/25/2017    Past medical and surgical history were reviewed and updated in EPIC.  Current Meds  Medication Sig  . aspirin EC 81 MG tablet Take 1 tablet (81 mg total) by mouth daily.  . benzonatate (TESSALON) 200 MG capsule Take 1 capsule (200 mg total) 3 (three) times daily as needed by mouth.  . bisacodyl (DULCOLAX) 5 MG EC tablet Take 10 mg by mouth daily as needed for mild constipation.   . carvedilol (COREG) 6.25 MG tablet Take 25 mg by mouth 2 (two) times daily.  [name] esomeprazole (NEXIUM) 40 MG  capsule Take 40 mg by mouth daily at 12 noon.  . famotidine-calcium carbonate-magnesium hydroxide (PEPCID COMPLETE) 10-800-165 MG CHEW chewable tablet Chew 1 tablet by mouth 4 (four) times daily.  . fexofenadine (ALLEGRA) 180 MG tablet Take 180 mg by mouth daily.    . furosemide (LASIX) 20 MG tablet Take 1 tablet (20 mg total) by mouth daily.  [name] ipratropium (ATROVENT) 0.06 % nasal spray Place 2 sprays into both nostrils 4 (four) times daily.  [name] LINZESS 290 MCG CAPS capsule TAKE 1 CAPSULE (290 MCG TOTAL) BY MOUTH DAILY BEFORE BREAKFAST.  [name] LORazepam (ATIVAN) 1 MG tablet Take 1 tablet (1 mg total) by mouth every 8 (eight) hours as needed.  [name] losartan (COZAAR) 100 MG tablet TAKE 1 TABLET (100 MG TOTAL) BY MOUTH DAILY.  . magnesium oxide (MAG-OX) 400 MG tablet Take 400 mg by mouth daily.  . Melatonin 3 MG TABS Take 1 tablet by mouth at bedtime.   . nitroGLYCERIN (NITROSTAT) 0.4 MG SL tablet Place 1 tablet (0.4 mg total) under the tongue every 5 (five) minutes as needed for chest pain. Maximum of 3 doses.  [name] ondansetron (ZOFRAN) 8 MG tablet Take 1 tablet (8 mg total) by mouth daily as needed. for nausea  . triamcinolone cream (KENALOG) 0.5 % Apply 1 application 2 (two) times daily as needed topically. (Patient taking differently: Apply 1 application topically 2 (two) times daily as needed (skin irritations). )  . zolpidem (AMBIEN) 10 MG tablet TAKE 1 TABLET BY MOUTH AT BEDTIME  . [DISCONTINUED] CARVEDILOL PO Take 50 mg by mouth 2 (two) times daily.     Allergies: Avelox [moxifloxacin]; Contrast media [iodinated diagnostic agents]; Crestor [rosuvastatin calcium]; Morphine and related; Niacin-simvastatin er; and Tape  Social History   Socioeconomic History  . Marital status: Married    Spouse name: Not on file  . Number of children: 2  . Years of education: 34  . Highest education level: Not on file  Social Needs  . Financial resource strain: Not on file  . Food insecurity - worry: Not on file  . Food insecurity - inability: Not on file  . Transportation needs - medical: Not on file  . Transportation needs - non-medical: Not on file  Occupational History  . Occupation: Shop Printmaker: [employer]    Comment: [place]  Tobacco Use  .  Smoking status: Never Smoker  . Smokeless tobacco: Never Used  Substance and Sexual Activity  . Alcohol use: No  . Drug use: No  . Sexual activity: Yes  Other Topics Concern  . Not on file  Social History Narrative  . Not on file    Family History  Problem Relation Age of Onset  . Coronary artery disease Father 76       CABG  . Aortic aneurysm Father 53       repaired  . Hyperlipidemia Father   . Lung cancer Mother   . Hypertension Mother   . Cancer Mother        bladder  . COPD Mother   . Stomach cancer Maternal Grandfather     Review of Systems: A 12-system review of systems was performed and was negative except as noted in the HPI.  --------------------------------------------------------------------------------------------------  Physical Exam: BP 110/60 (BP Location: Left Arm, Patient Position: Sitting, Cuff Size: Normal)   Pulse 64   Ht 6\' 2"  (1.88 m)   Wt 198 lb (89.8 kg)   BMI 25.42 kg/m   General:  Well-developed, well-nourished man, seated comfortably in the exam room. HEENT: No conjunctival pallor or scleral icterus. Moist mucous membranes.  OP clear. Neck: Supple without lymphadenopathy, thyromegaly, JVD, or HJR. No carotid bruit. Lungs: Normal work of breathing. Clear to auscultation bilaterally without wheezes or crackles. Heart: Regular rate and rhythm without murmurs, rubs, or gallops. Non-displaced PMI. Abd: Bowel sounds present. Soft, NT/ND without hepatosplenomegaly Ext: No lower extremity edema. Radial, PT, and DP pulses are 2+ bilaterally.  Right radial arteriotomy is well-healed. Skin: Warm and dry without rash.  EKG: Normal sinus rhythm without abnormalities.  Lab Results  Component Value Date   WBC 8.2 11/30/2017   HGB 15.2 11/30/2017   HCT 44.5 11/30/2017   MCV 88 11/30/2017   PLT 306 11/30/2017    Lab Results  Component Value Date   NA 141 12/19/2017   K 4.4 12/19/2017   CL 105 12/19/2017   CO2 29 12/19/2017   BUN 21 (H)  12/19/2017   CREATININE 1.20 12/19/2017   GLUCOSE 84 12/19/2017   ALT 16 (L) 11/08/2017    Lab Results  Component Value Date   CHOL 212 (H) 03/02/2013   HDL 27.70 (L) 03/02/2013   LDLCALC 83 10/25/2011   LDLDIRECT 135.8 03/02/2013   TRIG 168.0 (H) 03/02/2013   CHOLHDL 8 03/02/2013    --------------------------------------------------------------------------------------------------  ASSESSMENT AND PLAN: Coronary artery disease with atypical chest pain Catheterization showed nonobstructive CAD involving the LAD and RCA.  Intermittent chest pain that occurs in the setting of headaches is atypical and unlikely to be related to his CAD.  I recommend continued medical management with blood pressure control.  Hypertension Blood pressure is well controlled today, though readings at home and with his physical therapist have been quite high at times.  Given his son's history of an adrenal tumor, which sounds like a pheochromocytoma, I think it is important to exclude secondary causes including pheochromocytoma and hyperaldosteronism.  We will check a 24-hour urine fractionated catecholamines and metanephrines as well as plasma renin activity and serum aldosterone.  If these studies are unrevealing, we will obtain a renal artery Doppler.  No medication changes today.  Hyperlipidemia In the setting of moderate CAD, I think it is important to treat Mr. Marchia Bond his lipids aggressively.  Since his headache did not improve at all with statin holiday, I have instructed him to restart atorvastatin 20 mg daily.  Headaches Given visual associations, migraine is certainly a consideration.  I have encouraged Mr. Mines to speak with his primary care doctor about a neurology referral.  Follow-up: Return to clinic in 3 months.  Nelva Bush, MD 01/04/2018 1:11 PM

## 2018-01-05 MED ORDER — CARVEDILOL 25 MG PO TABS: 25.0000 mg | ORAL_TABLET | Freq: Two times a day (BID) | ORAL | 3 refills | Status: DC

## 2018-01-05 MED ORDER — LOSARTAN POTASSIUM 100 MG PO TABS: 100.0000 mg | ORAL_TABLET | Freq: Every day | ORAL | 2 refills | Status: DC

## 2018-01-05 NOTE — Telephone Encounter (Signed)
Patient requested refills on Losartan and carvedilol when leaving appointment yesterday.  S/w Dr End to confirm dosage of carvedilol he wanted patient to be on.  Dr End ordered patient to continue carvedilol 25 mg by mouth BID. Rx for carvedilol and losartan sent to pharmacy.

## 2018-01-06 NOTE — Addendum Note (Signed)
Addended by: Anselm Pancoast on: 01/06/2018 09:33 AM   Modules accepted: Orders

## 2018-01-18 ENCOUNTER — Ambulatory Visit: Payer: 59 | Admitting: Internal Medicine

## 2018-02-01 ENCOUNTER — Other Ambulatory Visit
Admission: RE | Admit: 2018-02-01 | Discharge: 2018-02-01 | Disposition: A | Discharge status: Home / Self Care | Payer: 59 | Source: Ambulatory Visit | Attending: Internal Medicine | Admitting: Internal Medicine

## 2018-02-01 DIAGNOSIS — I10 Essential (primary) hypertension: Secondary | ICD-10-CM | POA: Diagnosis not present

## 2018-02-06 ENCOUNTER — Other Ambulatory Visit
Admission: RE | Admit: 2018-02-06 | Discharge: 2018-02-06 | Disposition: A | Discharge status: Home / Self Care | Payer: 59 | Source: Ambulatory Visit | Attending: Internal Medicine | Admitting: Internal Medicine

## 2018-02-06 DIAGNOSIS — I10 Essential (primary) hypertension: Secondary | ICD-10-CM | POA: Insufficient documentation

## 2018-02-06 LAB — ALDOSTERONE + RENIN ACTIVITY W/ RATIO
ALDO / PRA Ratio: 30.8 — ABNORMAL HIGH (ref 0.0–30.0)
Aldosterone: 6 ng/dL (ref 0.0–30.0)
PRA LC/MS/MS: 0.195 ng/mL/hr (ref 0.167–5.380)

## 2018-02-07 ENCOUNTER — Other Ambulatory Visit: Payer: Self-pay | Admitting: Internal Medicine

## 2018-02-08 LAB — METANEPHRINES, URINE, 24 HOUR
METANEPH TOTAL UR: 65 ug/L
Metanephrines, 24H Ur: 104 ug/24 hr (ref 45–290)
NORMETANEPHRINE UR: 260 ug/L
Normetanephrine, 24H Ur: 416 ug/24 hr (ref 82–500)

## 2018-02-08 NOTE — Telephone Encounter (Signed)
Okay to refill Ambien? Last written on: 08/04/17 #30 with 3 refills.  LOV: 11/21/17 NOV: N/A

## 2018-02-09 NOTE — Telephone Encounter (Signed)
Rx faxed to [facility] in [place].  

## 2018-02-12 ENCOUNTER — Encounter: Payer: Self-pay | Admitting: Internal Medicine

## 2018-02-14 ENCOUNTER — Telehealth: Payer: Self-pay | Admitting: Internal Medicine

## 2018-02-14 ENCOUNTER — Encounter: Payer: Self-pay | Admitting: *Deleted

## 2018-02-14 DIAGNOSIS — I10 Essential (primary) hypertension: Secondary | ICD-10-CM

## 2018-02-14 DIAGNOSIS — Z79899 Other long term (current) drug therapy: Secondary | ICD-10-CM

## 2018-02-14 MED ORDER — SPIRONOLACTONE 25 MG PO TABS: 25.0000 mg | ORAL_TABLET | Freq: Every day | ORAL | 6 refills | Status: DC

## 2018-02-14 NOTE — Addendum Note (Signed)
Addended by: Vanessa Ralphs on: 02/14/2018 02:34 PM   Modules accepted: Orders

## 2018-02-14 NOTE — Telephone Encounter (Signed)
No answer. Left message to call back.  MyChart message sent with instructions to get lab work in 1 week at [facility].

## 2018-02-14 NOTE — Telephone Encounter (Signed)
Lab work discussed with the patient by phone.  Notable findings were slightly elevated serum aldosterone to plasma renin activity ratio, though plasma aldosterone concentration overall was normal.  Brian Atkins reports that his blood pressure remains suboptimally controlled despite being on maximum doses of losartan and carvedilol.  We have agreed to add spironolactone 25 mg daily.  I will have Brian Atkins come in for a basic metabolic panel in about 1 week to ensure stable renal function and potassium.  Nelva Bush, MD [facility] Pager: [phone]

## 2018-02-16 LAB — CATECHOLAMINES,UR.,FREE,24 HR
DOPAMINE RANDOM UR: 194 ug/L
Dopamine, Ur, 24Hr: 310 ug/24 hr (ref 0–510)
NOREPINEPH RAND UR: 32 ug/L
Norepinephrine,U,24H: 51 ug/24 hr (ref 0–135)
Total Volume: 1600

## 2018-02-21 ENCOUNTER — Other Ambulatory Visit
Admission: RE | Admit: 2018-02-21 | Discharge: 2018-02-21 | Disposition: A | Discharge status: Home / Self Care | Payer: 59 | Source: Ambulatory Visit | Attending: Internal Medicine | Admitting: Internal Medicine

## 2018-02-21 DIAGNOSIS — Z79899 Other long term (current) drug therapy: Secondary | ICD-10-CM | POA: Insufficient documentation

## 2018-02-21 DIAGNOSIS — I10 Essential (primary) hypertension: Secondary | ICD-10-CM | POA: Insufficient documentation

## 2018-02-21 LAB — BASIC METABOLIC PANEL
Anion gap: 9 (ref 5–15)
BUN: 22 mg/dL — ABNORMAL HIGH (ref 6–20)
CALCIUM: 9.1 mg/dL (ref 8.9–10.3)
CO2: 26 mmol/L (ref 22–32)
Chloride: 107 mmol/L (ref 101–111)
Creatinine, Ser: 1.34 mg/dL — ABNORMAL HIGH (ref 0.61–1.24)
GFR, EST NON AFRICAN AMERICAN: 56 mL/min — AB (ref >60)
Glucose, Bld: 95 mg/dL (ref 65–99)
Potassium: 4.3 mmol/L (ref 3.5–5.1)
Sodium: 142 mmol/L (ref 135–145)

## 2018-03-06 ENCOUNTER — Encounter: Payer: Self-pay | Admitting: Internal Medicine

## 2018-03-08 ENCOUNTER — Encounter: Payer: Self-pay | Admitting: Internal Medicine

## 2018-03-14 ENCOUNTER — Ambulatory Visit (INDEPENDENT_AMBULATORY_CARE_PROVIDER_SITE_OTHER): Payer: 59 | Admitting: Internal Medicine

## 2018-03-14 ENCOUNTER — Ambulatory Visit (INDEPENDENT_AMBULATORY_CARE_PROVIDER_SITE_OTHER): Payer: 59

## 2018-03-14 VITALS — BP 112/78 | HR 53 | Temp 97.5°F | Resp 16 | Wt 197.6 lb

## 2018-03-14 DIAGNOSIS — R200 Anesthesia of skin: Secondary | ICD-10-CM

## 2018-03-14 DIAGNOSIS — M5030 Other cervical disc degeneration, unspecified cervical region: Secondary | ICD-10-CM | POA: Diagnosis not present

## 2018-03-14 DIAGNOSIS — R202 Paresthesia of skin: Secondary | ICD-10-CM

## 2018-03-14 DIAGNOSIS — R5383 Other fatigue: Secondary | ICD-10-CM | POA: Diagnosis not present

## 2018-03-14 DIAGNOSIS — M50322 Other cervical disc degeneration at C5-C6 level: Secondary | ICD-10-CM | POA: Diagnosis not present

## 2018-03-14 DIAGNOSIS — G4701 Insomnia due to medical condition: Secondary | ICD-10-CM

## 2018-03-14 DIAGNOSIS — E538 Deficiency of other specified B group vitamins: Secondary | ICD-10-CM | POA: Diagnosis not present

## 2018-03-14 MED ORDER — ZOLPIDEM TARTRATE ER 12.5 MG PO TBCR: 12.5000 mg | EXTENDED_RELEASE_TABLET | Freq: Every evening | ORAL | 4 refills | Status: DC | PRN

## 2018-03-14 NOTE — Patient Instructions (Addendum)
I am prescribing Ambien CR (generic) to help you sleep longer at night  You may have to relinquish your current pills to the pharmacisit to get the new ones

## 2018-03-14 NOTE — Progress Notes (Signed)
Subjective:  Patient ID: Brian Atkins, male    DOB: 1956-01-17  Age: 62 y.o. MRN: [id number]  CC: The primary encounter diagnosis was Numbness of fingers of both hands. Diagnoses of B12 deficiency, Other cervical disc degeneration, unspecified cervical region, Numbness and tingling of upper and lower extremities of both sides, Fatigue, unspecified type, and Insomnia due to medical condition were also pertinent to this visit.  HPI Brian Atkins presents for evaluation of  New onset recurrent intermittent numbness involving the forearms  and lower legs bilaterally .  Started 3-4 weeks ago .  Does not occur while supine but aggravated by standing and sitting.  Starts at the elbows , and knees and from the knees to the ankles.   Using g a different pillow seems to alleviate some of the numbness in the arms.  Denies weakness,  But notes that he has been scuffing the medial side of his right shoe.   2) Fatigue,  severe.  Goes home for lunch and naps. Does not help .  Feels worn out lethargic for the rest of the afternoon,  Falls asleep by 6:30 pm pre dinner. .  Snores terribly all  night,  Has avoided having a sleep study   Outpatient Medications Prior to Visit  Medication Sig Dispense Refill  . aspirin EC 81 MG tablet Take 1 tablet (81 mg total) by mouth daily. 90 tablet 3  . atorvastatin (LIPITOR) 20 MG tablet Take 1 tablet (20 mg total) by mouth daily. 90 tablet 3  . benzonatate (TESSALON) 200 MG capsule Take 1 capsule (200 mg total) 3 (three) times daily as needed by mouth. 30 capsule 0  . bisacodyl (DULCOLAX) 5 MG EC tablet Take 10 mg by mouth daily as needed for mild constipation.     . carvedilol (COREG) 25 MG tablet Take 1 tablet (25 mg total) by mouth 2 (two) times daily. (Patient taking differently: Take 25 mg by mouth 2 (two) times daily. ) 180 tablet 3  . esomeprazole (NEXIUM) 40 MG capsule Take 40 mg by mouth daily at 12 noon.    . furosemide (LASIX) 20 MG tablet Take 1 tablet (20 mg  total) by mouth daily. 30 tablet 11  . ipratropium (ATROVENT) 0.06 % nasal spray Place 2 sprays into both nostrils 4 (four) times daily. 15 mL 12  . LINZESS 290 MCG CAPS capsule TAKE 1 CAPSULE (290 MCG TOTAL) BY MOUTH DAILY BEFORE BREAKFAST. 30 capsule 5  . LORazepam (ATIVAN) 1 MG tablet Take 1 tablet (1 mg total) by mouth every 8 (eight) hours as needed. 30 tablet 3  . losartan (COZAAR) 100 MG tablet Take 1 tablet (100 mg total) by mouth daily. 90 tablet 2  . magnesium oxide (MAG-OX) 400 MG tablet Take 400 mg by mouth daily.    . Melatonin 3 MG TABS Take 1 tablet by mouth at bedtime.     . ondansetron (ZOFRAN) 8 MG tablet Take 1 tablet (8 mg total) by mouth daily as needed. for nausea 90 tablet 1  . rifaximin (XIFAXAN) 550 MG TABS tablet Take 550 mg by mouth.    . spironolactone (ALDACTONE) 25 MG tablet Take 1 tablet (25 mg total) by mouth daily. 30 tablet 6  . triamcinolone cream (KENALOG) 0.5 % Apply 1 application 2 (two) times daily as needed topically. (Patient taking differently: Apply 1 application topically 2 (two) times daily as needed (skin irritations). ) 30 g 0  . zolpidem (AMBIEN) 10 MG tablet TAKE 1  TABLET BY MOUTH EVERYDAY AT BEDTIME 30 tablet 3  . famotidine-calcium carbonate-magnesium hydroxide (PEPCID COMPLETE) 10-800-165 MG CHEW chewable tablet Chew 1 tablet by mouth 4 (four) times daily.    . fexofenadine (ALLEGRA) 180 MG tablet Take 180 mg by mouth daily.    . nitroGLYCERIN (NITROSTAT) 0.4 MG SL tablet Place 1 tablet (0.4 mg total) under the tongue every 5 (five) minutes as needed for chest pain. Maximum of 3 doses. 35 tablet 3   No facility-administered medications prior to visit.     Review of Systems;  Patient denies headache, fevers, malaise, unintentional weight loss, skin rash, eye pain, sinus congestion and sinus pain, sore throat, dysphagia,  hemoptysis , cough, dyspnea, wheezing, chest pain, palpitations, orthopnea, edema, abdominal pain, nausea, melena, diarrhea,  constipation, flank pain, dysuria, hematuria, urinary  Frequency, nocturia,  seizures,  Focal weakness, Loss of consciousness,  Tremor, insomnia, depression, anxiety, and suicidal ideation.      Objective:  BP 112/78 (BP Location: Left Arm, Patient Position: Sitting, Cuff Size: Normal)   Pulse (!) 53   Temp (!) 97.5 F (36.4 C) (Oral)   Resp 16   Wt 197 lb 9.6 oz (89.6 kg)   SpO2 95%   BMI 25.37 kg/m   BP Readings from Last 3 Encounters:  03/14/18 112/78  01/04/18 110/60  12/07/17 (!) 144/104    Wt Readings from Last 3 Encounters:  03/14/18 197 lb 9.6 oz (89.6 kg)  01/04/18 198 lb (89.8 kg)  12/07/17 202 lb (91.6 kg)    General appearance: alert, cooperative and appears stated age Neck: no adenopathy, no carotid bruit, supple, symmetrical, trachea midline and thyroid not enlarged, symmetric, no tenderness/mass/nodules Back: symmetric, no curvature. ROM normal. No CVA tenderness. Lungs: clear to auscultation bilaterally Heart: regular rate and rhythm, S1, S2 normal, no murmur, click, rub or gallop Abdomen: soft, non-tender; bowel sounds normal; no masses,  no organomegaly Pulses: 2+ and symmetric Skin: Skin color, texture, turgor normal. No rashes or lesions Lymph nodes: Cervical, supraclavicular, and axillary nodes normal. Neuro:  awake and interactive with normal mood and affect. Higher cortical functions are normal. Speech is clear without word-finding difficulty or dysarthria. Extraocular movements are intact. Visual fields of both eyes are grossly intact. Sensation to light touch is decreased  bilaterally of distal upper and lower extremities. Motor examination shows 4+/5 symmetric hand grip and upper extremity and 5/5 lower extremity strength. There is no pronation or drift. Gait is non-ataxic    Lab Results  Component Value Date   HGBA1C 5.8 11/21/2017   HGBA1C 5.0 03/02/2013   HGBA1C 5.0 10/25/2011    Lab Results  Component Value Date   CREATININE 1.34 (H)  02/21/2018   CREATININE 1.20 12/19/2017   CREATININE 0.96 11/30/2017    Lab Results  Component Value Date   WBC 8.2 11/30/2017   HGB 15.2 11/30/2017   HCT 44.5 11/30/2017   PLT 306 11/30/2017   GLUCOSE 95 02/21/2018   CHOL 212 (H) 03/02/2013   TRIG 168.0 (H) 03/02/2013   HDL 27.70 (L) 03/02/2013   LDLDIRECT 135.8 03/02/2013   LDLCALC 83 10/25/2011   ALT 16 (L) 11/08/2017   AST 25 11/08/2017   NA 142 02/21/2018   K 4.3 02/21/2018   CL 107 02/21/2018   CREATININE 1.34 (H) 02/21/2018   BUN 22 (H) 02/21/2018   CO2 26 02/21/2018   TSH 1.99 11/21/2017   PSA 1.92 03/02/2013   INR 1.0 11/30/2017   HGBA1C 5.8 11/21/2017  No results found.  Assessment & Plan:   Problem List Items Addressed This Visit    Numbness and tingling of upper and lower extremities of both sides    B12 level is < 300.  Will Atkins empirically for b12 deficiency.  Cervical spine films are notable for DDD at multiple levels from c4 to c7. MRI cervical spine ordered.      Insomnia due to medical condition    ambien cr prescribed       Fatigue    Screening labs normal.  Has  Untreated OSA .  needs repeat sleep study       B12 deficiency    Level is <  300 in setting of new onset neuropathy . b12 injections to be done by wife.        Other Visit Diagnoses    Numbness of fingers of both hands    -  Primary   Relevant Orders   B12 (Completed)   RBC Folate   DG Cervical Spine Complete (Completed)   Other cervical disc degeneration, unspecified cervical region       Relevant Orders   MR Cervical Spine Wo Contrast      I have discontinued Cristopher Estimable. Natarajan's zolpidem. I am also having him start on zolpidem, cyanocobalamin, and SYRINGE 3CC/25GX1". Additionally, I am having him maintain his fexofenadine, esomeprazole, famotidine-calcium carbonate-magnesium hydroxide, LORazepam, Melatonin, ipratropium, ondansetron, bisacodyl, magnesium oxide, LINZESS, benzonatate, triamcinolone cream, aspirin EC,  nitroGLYCERIN, furosemide, atorvastatin, losartan, carvedilol, spironolactone, and rifaximin.  Meds ordered this encounter  Medications  . zolpidem (AMBIEN CR) 12.5 MG CR tablet    Sig: Take 1 tablet (12.5 mg total) by mouth at bedtime as needed for sleep.    Dispense:  30 tablet    Refill:  4  . cyanocobalamin (,VITAMIN B-12,) 1000 MCG/ML injection    Sig: Inject 1 mL (1,000 mcg total) into the muscle daily. For 3 days,  Then weekly for 4 weeks ,  Then monthly thereafter    Dispense:  10 mL    Refill:  2  . Syringe/Needle, Disp, (SYRINGE 3CC/25GX1") 25G X 1" 3 ML MISC    Sig: Use for b12 injections    Dispense:  50 each    Refill:  0    Medications Discontinued During This Encounter  Medication Reason  . zolpidem (AMBIEN) 10 MG tablet     Follow-up: No follow-ups on file.   [name], MD

## 2018-03-15 ENCOUNTER — Encounter: Payer: Self-pay | Admitting: Internal Medicine

## 2018-03-15 DIAGNOSIS — R200 Anesthesia of skin: Secondary | ICD-10-CM | POA: Insufficient documentation

## 2018-03-15 DIAGNOSIS — R202 Paresthesia of skin: Secondary | ICD-10-CM

## 2018-03-15 DIAGNOSIS — E538 Deficiency of other specified B group vitamins: Secondary | ICD-10-CM | POA: Insufficient documentation

## 2018-03-15 LAB — VITAMIN B12: VITAMIN B 12: 224 pg/mL (ref 211–911)

## 2018-03-15 MED ORDER — SYRINGE 25G X 1" 3 ML MISC
0 refills | Status: AC
Start: 2018-03-15 — End: ?

## 2018-03-15 MED ORDER — CYANOCOBALAMIN 1000 MCG/ML IJ SOLN: 1000.0000 ug | Freq: Every day | INTRAMUSCULAR | 2 refills | Status: DC

## 2018-03-15 NOTE — Assessment & Plan Note (Signed)
ambien cr prescribed

## 2018-03-15 NOTE — Assessment & Plan Note (Signed)
B12 level is < 300.  Will treat empirically for b12 deficiency.  Cervical spine films are notable for DDD at multiple levels from c4 to c7. MRI cervical spine ordered.

## 2018-03-15 NOTE — Assessment & Plan Note (Signed)
Screening labs normal.  Has  Untreated OSA .  needs repeat sleep study

## 2018-03-15 NOTE — Assessment & Plan Note (Signed)
Level is <  300 in setting of new onset neuropathy . b12 injections to be done by wife.

## 2018-03-16 LAB — FOLATE RBC: RBC FOLATE: 499 ng/mL (ref >280)

## 2018-03-20 ENCOUNTER — Encounter (INDEPENDENT_AMBULATORY_CARE_PROVIDER_SITE_OTHER): Payer: Self-pay

## 2018-03-20 ENCOUNTER — Encounter: Payer: Self-pay | Admitting: Internal Medicine

## 2018-03-21 ENCOUNTER — Ambulatory Visit: Payer: Self-pay | Admitting: Internal Medicine

## 2018-03-22 DIAGNOSIS — G4733 Obstructive sleep apnea (adult) (pediatric): Secondary | ICD-10-CM | POA: Diagnosis not present

## 2018-03-22 DIAGNOSIS — R5381 Other malaise: Secondary | ICD-10-CM | POA: Diagnosis not present

## 2018-03-23 ENCOUNTER — Ambulatory Visit: Payer: 59

## 2018-03-23 ENCOUNTER — Ambulatory Visit (INDEPENDENT_AMBULATORY_CARE_PROVIDER_SITE_OTHER): Payer: 59 | Admitting: *Deleted

## 2018-03-23 DIAGNOSIS — E538 Deficiency of other specified B group vitamins: Secondary | ICD-10-CM

## 2018-03-23 MED ORDER — CYANOCOBALAMIN 1000 MCG/ML IJ SOLN
1000.0000 ug | Freq: Once | INTRAMUSCULAR | Status: AC
Start: 2018-03-23 — End: 2018-03-23
  Administered 2018-03-23: 1000 ug via INTRAMUSCULAR

## 2018-03-23 NOTE — Progress Notes (Signed)
Patient presented for B 12 injection to left deltoid, patient voiced no concerns nor showed any signs of distress during injection, patient spouse taught proper IM injection of B 12.

## 2018-03-28 ENCOUNTER — Ambulatory Visit
Admission: RE | Admit: 2018-03-28 | Discharge: 2018-03-28 | Disposition: A | Discharge status: Home / Self Care | Payer: Commercial Managed Care - HMO | Source: Ambulatory Visit | Attending: Internal Medicine | Admitting: Internal Medicine

## 2018-03-28 DIAGNOSIS — M50223 Other cervical disc displacement at C6-C7 level: Secondary | ICD-10-CM | POA: Diagnosis not present

## 2018-03-28 DIAGNOSIS — M5030 Other cervical disc degeneration, unspecified cervical region: Secondary | ICD-10-CM | POA: Diagnosis present

## 2018-03-29 ENCOUNTER — Encounter: Payer: Self-pay | Admitting: Internal Medicine

## 2018-03-29 DIAGNOSIS — R200 Anesthesia of skin: Secondary | ICD-10-CM

## 2018-03-29 DIAGNOSIS — R202 Paresthesia of skin: Principal | ICD-10-CM

## 2018-04-07 ENCOUNTER — Telehealth: Payer: Self-pay | Admitting: Internal Medicine

## 2018-04-07 MED ORDER — ALPHA LIPOIC ACID 200 MG PO CAPS: 1.0000 | ORAL_CAPSULE | Freq: Three times a day (TID) | ORAL | 2 refills | Status: DC

## 2018-04-07 NOTE — Telephone Encounter (Signed)
MyChart message sent  Re trying alpha lipoic acid

## 2018-04-11 ENCOUNTER — Ambulatory Visit: Payer: 59 | Admitting: Internal Medicine

## 2018-04-11 DIAGNOSIS — K5909 Other constipation: Secondary | ICD-10-CM | POA: Diagnosis not present

## 2018-04-12 ENCOUNTER — Ambulatory Visit: Payer: 59 | Admitting: Internal Medicine

## 2018-05-03 NOTE — Progress Notes (Signed)
Cardiology Office Note Date:  05/04/2018  Patient ID:  Brian, Atkins [birth year], MRN [id number] PCP:  Brian Mc, MD  Cardiologist:  [name], MD    Chief Complaint: Follow up HTN  History of Present Illness: Brian Atkins is a 62 y.o. male with history of nonobstructive CAD by [place] in 11/2017, HTN, marginal zone B-cell lymphoma, headache disorder, and GERD status post Nissen fundoplication who presents for follow-up of HTN.  Patient was seen by Dr. Saunders Atkins in 11/2017 with elevated blood pressure, palpitations, headaches, and chest pain.  He subsequently underwent LHC which revealed moderate, nonobstructive CAD involving the proximal/mid LAD and mid RCA.  Both lesions were not hemodynamically significant by FFR.  LVEF greater than 65%, moderately elevated LVEDP consistent with diastolic dysfunction.  Medical therapy and risk factor modification was recommended.  TTE 12/29/17 showed an EF of 60-65%, normal wall motion, normal LV diastolic function, mildly dilated LA.  At the patient's last follow-up on 01/04/2018 the patient felt like he was breathing a little bit better.  However, he continued to have occasional chest tightness that seemed to correspond with his headaches and elevated BP readings.  His home blood pressure readings were somewhat labile and typically elevated up to 165/110 in the setting of his headaches.  His headaches were noted to be frequently accompanied by vision changes including floaters and flashes.  The patient was concerned that Lipitor may have been contributing to his headaches, thus he had self discontinued Lipitor prior to his office visit in 12/2017.  Following the self discontinuation of Lipitor he did not notice any change in his headaches.  At that time, the patient had never been evaluated by a neurologist.  Prior MRI of the brain (preceding cardiac catheterization) did not show any significant intracranial abnormalities.  Given the patient's son's history of adrenal  tumor it was recommended that the patient be excluded for secondary causes of hypertension including pheochromocytoma and hyperaldosteronism.  Catecholamines/metanephrines showed no evidence of pheochromocytoma.  Aldosterone/renin activity ratio showed a minimally elevated aldosterone/plasma renin activity ratio, though with a serum aldosterone level that was within normal limits.  He was started on spironolactone and 01/2018.  Follow-up labs in 02/2018 showed a mild elevation in his serum creatinine and potassium at goal.  It was recommended he change his Lasix to as needed dosing.  Patient saw PCP and late 02/2018 noting paresthesias of the bilateral upper extremities.  B12 was low.  C-spine plain films showed DDD. MRI of the cervical spine in 03/2018 was nonacute.  He comes in accompanied by his wife today. He is doing reasonably well. He continues to note a "hear pounding" sensation, mostly at night. This frequently precludes him from being able to lay on his left side. It is sometimes associated with dizziness and palpitations. No associated chest pain or SOB. His blood pressure continues to be well controlled in the morning hours, though by the late afternoon to early evening hours, his blood pressure has typically trended up to the 132G to 401U systolic. He reports he can tell when his AM Coreg has worn off. He has been referred to neurology at [facility] by his PCP for his headaches and paresthesias, appointment in August. He is also seeing GI at [facility] for motility disorder. He is scheduled for a home sleep study as well.   Review of the patient's chart showed CT scans of the abdomen and pelvis from 2014 that showed multiple nondescript lesions.    Past  Medical History:  Diagnosis Date  . Coronary artery disease, non-occlusive    a. LHC 12/18: ostLAD 20%, p/mLAD 60% FFR 0.84, ost ramus 50%, mRCA 50% FFR 0.94, EF 65%  . Diverticulitis   . Diverticulosis   . Dyslipidemia   . GERD (gastroesophageal reflux  disease)   . Hemorrhoids   . Hiatal hernia   . History of echocardiogram    a. TTE 1/19: EF of 60-65%, normal wall motion, normal LV diastolic function, mildly dilated LA  . Labile hypertension   . Lymphoma ([place])   . Medial epicondylitis of right elbow   . Meralgia paresthetica of right side   . Microscopic hematuria     Past Surgical History:  Procedure Laterality Date  . INTRAVASCULAR PRESSURE WIRE/FFR STUDY N/A 12/07/2017   Procedure: INTRAVASCULAR PRESSURE WIRE/FFR STUDY;  Surgeon: Brian Bush, MD;  Location: [facility];  Service: Cardiovascular;  Laterality: N/A;  . LEFT HEART CATH AND CORONARY ANGIOGRAPHY Left 12/07/2017   Procedure: LEFT HEART CATH AND CORONARY ANGIOGRAPHY;  Surgeon: Brian Bush, MD;  Location: [facility];  Service: Cardiovascular;  Laterality: Left;  . nissen funduplication  [id number]   Brian Atkins  . SPLENECTOMY      Current Meds  Medication Sig  . Alpha Lipoic Acid 200 MG CAPS Take 1 capsule by mouth 3 (three) times daily.  [name] aspirin EC 81 MG tablet Take 1 tablet (81 mg total) by mouth daily.  . benzonatate (TESSALON) 200 MG capsule Take 1 capsule (200 mg total) 3 (three) times daily as needed by mouth.  . bisacodyl (DULCOLAX) 5 MG EC tablet Take 10 mg by mouth daily as needed for mild constipation.   . cyanocobalamin (,VITAMIN B-12,) 1000 MCG/ML injection Inject 1 mL (1,000 mcg total) into the muscle daily. For 3 days,  Then weekly for 4 weeks ,  Then monthly thereafter  . esomeprazole (NEXIUM) 40 MG capsule Take 40 mg by mouth daily at 12 noon.  . famotidine-calcium carbonate-magnesium hydroxide (PEPCID COMPLETE) 10-800-165 MG CHEW chewable tablet Chew 1 tablet by mouth 4 (four) times daily.  . fexofenadine (ALLEGRA) 180 MG tablet Take 180 mg by mouth daily.  . furosemide (LASIX) 20 MG tablet Take 1 tablet (20 mg total) by mouth daily.  [name] ipratropium (ATROVENT) 0.06 % nasal spray Place 2 sprays into both nostrils 4 (four) times  daily.  [name] LINZESS 290 MCG CAPS capsule TAKE 1 CAPSULE (290 MCG TOTAL) BY MOUTH DAILY BEFORE BREAKFAST.  [name] LORazepam (ATIVAN) 1 MG tablet Take 1 tablet (1 mg total) by mouth every 8 (eight) hours as needed.  [name] losartan (COZAAR) 100 MG tablet Take 1 tablet (100 mg total) by mouth daily.  . magnesium oxide (MAG-OX) 400 MG tablet Take 400 mg by mouth daily.  . Melatonin 3 MG TABS Take 1 tablet by mouth at bedtime.   . ondansetron (ZOFRAN) 8 MG tablet Take 1 tablet (8 mg total) by mouth daily as needed. for nausea  . spironolactone (ALDACTONE) 25 MG tablet Take 1 tablet (25 mg total) by mouth daily.  . Syringe/Needle, Disp, (SYRINGE 3CC/25GX1") 25G X 1" 3 ML MISC Use for b12 injections  . triamcinolone cream (KENALOG) 0.5 % Apply 1 application 2 (two) times daily as needed topically. (Patient taking differently: Apply 1 application topically 2 (two) times daily as needed (skin irritations). )  . zolpidem (AMBIEN CR) 12.5 MG CR tablet Take 1 tablet (12.5 mg total) by mouth at bedtime as needed for sleep.  Allergies:   Avelox [moxifloxacin]; Contrast media [iodinated diagnostic agents]; Crestor [rosuvastatin calcium]; Morphine and related; Niacin-simvastatin er; and Tape   Social History:  The patient  reports that he has never smoked. He has never used smokeless tobacco. He reports that he does not drink alcohol or use drugs.   Family History:  The patient's family history includes Aortic aneurysm (age of onset: 16) in his father; COPD in his mother; Cancer in his mother; Coronary artery disease (age of onset: 62) in his father; Hyperlipidemia in his father; Hypertension in his mother; Lung cancer in his mother; Stomach cancer in his maternal grandfather.  ROS:   Review of Systems  Constitutional: Positive for malaise/fatigue. Negative for chills, diaphoresis, fever and weight loss.  HENT: Negative for congestion.   Eyes: Negative for discharge and redness.  Respiratory: Negative for cough,  hemoptysis, sputum production, shortness of breath and wheezing.   Cardiovascular: Positive for palpitations. Negative for chest pain, orthopnea, claudication, leg swelling and PND.  Gastrointestinal: Positive for abdominal pain, constipation and nausea. Negative for blood in stool, heartburn, melena and vomiting.  Genitourinary: Negative for hematuria.  Musculoskeletal: Negative for falls and myalgias.  Skin: Negative for rash.  Neurological: Positive for dizziness, tingling, sensory change and weakness. Negative for tremors, speech change, focal weakness and loss of consciousness.  Endo/Heme/Allergies: Does not bruise/bleed easily.  Psychiatric/Behavioral: Negative for substance abuse. The patient is nervous/anxious.   All other systems reviewed and are negative.    PHYSICAL EXAM:  VS:  BP (!) 136/94 (BP Location: Left Arm, Patient Position: Sitting, Cuff Size: Normal)   Pulse 60   Ht 6\' 2"  (1.88 m)   Wt 198 lb 8 oz (90 kg)   BMI 25.49 kg/m  BMI: Body mass index is 25.49 kg/m.  Physical Exam  Constitutional: He is oriented to person, place, and time. He appears well-developed and well-nourished.  HENT:  Head: Normocephalic and atraumatic.  Eyes: Right eye exhibits no discharge. Left eye exhibits no discharge.  Neck: Normal range of motion. No JVD present.  Cardiovascular: Normal rate, regular rhythm, S1 normal, S2 normal and normal heart sounds. Exam reveals no distant heart sounds, no friction rub, no midsystolic click and no opening snap.  No murmur heard. Pulses:      Posterior tibial pulses are 2+ on the right side, and 2+ on the left side.  Non-displaced PMI  Pulmonary/Chest: Effort normal and breath sounds normal. No respiratory distress. He has no decreased breath sounds. He has no wheezes. He has no rales. He exhibits no tenderness.  Abdominal: Soft. He exhibits no distension. There is no tenderness.  Musculoskeletal: He exhibits no edema.  Neurological: He is alert and  oriented to person, place, and time.  Skin: Skin is warm and dry. No cyanosis. Nails show no clubbing.  Psychiatric: He has a normal mood and affect. His speech is normal and behavior is normal. Judgment and thought content normal.     EKG:  Was ordered and interpreted by me today. Shows NSR, 60 bpm, no acute st/t changes  Recent Labs: 11/08/2017: ALT 16 11/21/2017: Pro B Natriuretic peptide (BNP) 21.0; TSH 1.99 11/30/2017: Hemoglobin 15.2; Platelets 306 02/21/2018: BUN 22; Creatinine, Ser 1.34; Potassium 4.3; Sodium 142  No results found for requested labs within last 8760 hours.   CrCl cannot be calculated (Patient's most recent lab result is older than the maximum 21 days allowed.).   Wt Readings from Last 3 Encounters:  05/04/18 198 lb 8 oz (90 kg)  03/14/18 197 lb 9.6 oz (89.6 kg)  01/04/18 198 lb (89.8 kg)     Other studies reviewed: Additional studies/records reviewed today include: summarized above  ASSESSMENT AND PLAN:  1. Nonocclusive CAD of the native coronary arteries without angina: No symptoms concerning for angina at this time. Recent nonocclusive LHC as above. Continue current medical therapy including ASA, Lipitor and Coreg. No plans for further ischemic evaluation at this time.   2. HTN: Blood pressure is poorly controlled today at 136/94. BP readings at home in the mornings are typically in the low 808U systolic with PM readings typically in the 110R to 159Y systolic. Add hydralazine 25 mg bid to be taken at noon and 8 PM. Continue Coreg 25 mg bid, Lasix 20 mg prn, losartan 100 mg daily, and spironolactone 25 mg daily. Agree with sleep study.   3. Abnormal aldosterone/PRA ratio with history of abnormal CT abdomen/pelvis: Offered referral to [facility], patient declined at this time. He would like to see how his BP responds to the addition of hydralazine. If his BP remains elevated he would like the referral to [facility], which is reasonable. He may benefit from repeat  imaging of his abdomen/pelvis to better characterize/trend the nondescript lesions noted in 2015. Perhaps his PCP could assist with this if indicated.   4. Palpitations: Offered Holter, patient declined.   5. History of lymphoma: Followed at [facility]. May need to consider repeat imaging as above.   6. Headaches/paresthesias: Started on alpha lipoic acid by PCP. Has been referred to [facility].   7. Motility disorder: Being evaluated at [facility].  Discussed with Dr. Saunders Atkins.  Disposition: F/u with Dr. Saunders Atkins in 3 months.   Current medicines are reviewed at length with the patient today.  The patient did not have any concerns regarding medicines.  Melvern Banker PA-C 05/04/2018 4:20 PM     [address] [place], [place] [address] [phone]

## 2018-05-04 ENCOUNTER — Encounter: Payer: Self-pay | Admitting: Physician Assistant

## 2018-05-04 ENCOUNTER — Ambulatory Visit (INDEPENDENT_AMBULATORY_CARE_PROVIDER_SITE_OTHER): Payer: 59 | Admitting: Physician Assistant

## 2018-05-04 VITALS — BP 136/94 | HR 60 | Ht 74.0 in | Wt 198.5 lb

## 2018-05-04 DIAGNOSIS — Z8579 Personal history of other malignant neoplasms of lymphoid, hematopoietic and related tissues: Secondary | ICD-10-CM | POA: Diagnosis not present

## 2018-05-04 DIAGNOSIS — I10 Essential (primary) hypertension: Secondary | ICD-10-CM

## 2018-05-04 DIAGNOSIS — I2510 Atherosclerotic heart disease of native coronary artery without angina pectoris: Secondary | ICD-10-CM

## 2018-05-04 DIAGNOSIS — K599 Functional intestinal disorder, unspecified: Secondary | ICD-10-CM | POA: Diagnosis not present

## 2018-05-04 DIAGNOSIS — R002 Palpitations: Secondary | ICD-10-CM | POA: Diagnosis not present

## 2018-05-04 DIAGNOSIS — R202 Paresthesia of skin: Secondary | ICD-10-CM | POA: Diagnosis not present

## 2018-05-04 DIAGNOSIS — Z8572 Personal history of non-Hodgkin lymphomas: Secondary | ICD-10-CM

## 2018-05-04 DIAGNOSIS — R899 Unspecified abnormal finding in specimens from other organs, systems and tissues: Secondary | ICD-10-CM | POA: Diagnosis not present

## 2018-05-04 MED ORDER — HYDRALAZINE HCL 25 MG PO TABS: ORAL_TABLET | ORAL | 3 refills | Status: DC

## 2018-05-04 NOTE — Patient Instructions (Signed)
Medication Instructions:  Your physician has recommended you make the following change in your medication:  1- START Hydralazine 25 mg, take 1 tablet at noon and at 8 pm.  Call Monday with Blood pressure and heart rate readings.   Labwork: none  Testing/Procedures: none  Follow-Up: Your physician recommends that you schedule a follow-up appointment in: 2-3 MONTHS WITH DR END.   If you need a refill on your cardiac medications before your next appointment, please call your pharmacy.

## 2018-05-08 ENCOUNTER — Telehealth: Payer: Self-pay | Admitting: Physician Assistant

## 2018-05-08 NOTE — Telephone Encounter (Signed)
Please increase hydralazine to 50 mg bid. Is he agreeable to referral to [facility] endocrinology?

## 2018-05-08 NOTE — Telephone Encounter (Signed)
Pt wife is calling back with pt BP readings: 5-16-pm 139/84 HR 64 5-17 am-154/94 HR 60 5-18 pm-123/71 HR 62 1:54 pm 5-18 pm-144/92 HR 67  5:06 pm 5-19 am-142/95 HR 61  5-19 pm-144/90 HR 68 1:55 pm 5-19 pm 149/99 HR 71 6:00 pm 5-19 pm 130/87 HR 61 10:00 pm 5-20 am 167/97 HR 66

## 2018-05-08 NOTE — Telephone Encounter (Signed)
Patient seen on 05/04/18 by Thurmond Butts, [place]. Hydralazine 25 mg- take 1 tablet at noon & 1 tablet at 8 pm, was added. The patient's wife is calling with follow up BP readings today.  To [place], [place] to review.

## 2018-05-09 MED ORDER — HYDRALAZINE HCL 50 MG PO TABS: ORAL_TABLET | ORAL | 6 refills | Status: DC

## 2018-05-09 MED ORDER — AMLODIPINE BESYLATE 5 MG PO TABS: 5.0000 mg | ORAL_TABLET | Freq: Every day | ORAL | 3 refills | Status: DC

## 2018-05-09 NOTE — Telephone Encounter (Signed)
S/w patient. He said since calling earlier, he has talked with his wife. I reviewed plan of care for referral to endocrinology and to stop hydralazine and start amlodipine. He was agreeable to plan.

## 2018-05-09 NOTE — Telephone Encounter (Signed)
Concern for underlying driving force with his symptoms needs to be worked up by PCP and endocrinology. He has been noted to have multiple lesions on CT abdomen dating back to 2015 that were concerning for possible malignancy that have not been followed up upon. He should follow up with his PCP regarding this as it may be playing a role.   To date, there is no underlying cardiac etiology driving his symptoms that has been found.   Given his palpitations, I would recommend we revisit a Holter monitor as was discussed at his office visit, though he declined. One side effect of hydralazine can be palpitations.    Please have him stop hydralazine and start amlodipine 5 mg daily (normal EF by TTE in 12/2017).

## 2018-05-09 NOTE — Telephone Encounter (Signed)
S/w wife. She verbalized understanding of [name]'s recommendations and discussion. She is agreeable to continue with the referral to endocrinology. She verbalized understanding to have patient stop hydralazine and start amlidipine 5 mg daily. Rx sent to pharmacy.

## 2018-05-09 NOTE — Telephone Encounter (Signed)
S/w representative at [facility].  To place referral, office visit note, recent labs, and demographics faxed to [phone]. Patient will be contacted by them for the referral.

## 2018-05-09 NOTE — Telephone Encounter (Signed)
Patient calling with update  Was told to take 2 pills at lunch time His BP reading was 120/81 pulse 68 His heart still feels like his heart is pounding, he also has ringing in ears and dizziness  Describes it as an anxious feeling Please call to discuss

## 2018-05-09 NOTE — Telephone Encounter (Signed)
S/w Patient's wife, ok per DPR. Patient had the "heart pounding" all night last night. Patient has been breaking out in a sweat about 1 hour after taking hydralazine, last about 10-15 min and then he is fine. She's concerned because patient is cold a lot and this has been happening for a few months now. She expressed she feels like these blood pressure medications are just "putting a bandaid" on the issue.  She wants to know what's cause his symptoms. They are willing to have referral to endocrinology. [facility] neurology appointment referral is scheduled for August.  Encouraged her to call off and on to see if they have a sooner appointment. Patient is on waiting list.  She verbalized understanding to increase hydralazine to 50 mg BID. Rx sent to pharmacy.  Advised I would route to [facility] to see if there's any other explanation he can offer.  Will initiate referral to [facility].

## 2018-06-01 DIAGNOSIS — R200 Anesthesia of skin: Secondary | ICD-10-CM | POA: Diagnosis not present

## 2018-06-01 DIAGNOSIS — R202 Paresthesia of skin: Secondary | ICD-10-CM | POA: Diagnosis not present

## 2018-06-07 DIAGNOSIS — Z6824 Body mass index (BMI) 24.0-24.9, adult: Secondary | ICD-10-CM | POA: Diagnosis not present

## 2018-06-07 DIAGNOSIS — R799 Abnormal finding of blood chemistry, unspecified: Secondary | ICD-10-CM | POA: Diagnosis not present

## 2018-06-07 DIAGNOSIS — R7989 Other specified abnormal findings of blood chemistry: Secondary | ICD-10-CM | POA: Insufficient documentation

## 2018-06-08 DIAGNOSIS — C8580 Other specified types of non-Hodgkin lymphoma, unspecified site: Secondary | ICD-10-CM | POA: Diagnosis not present

## 2018-06-08 DIAGNOSIS — H532 Diplopia: Secondary | ICD-10-CM | POA: Diagnosis not present

## 2018-06-08 DIAGNOSIS — C884 Extranodal marginal zone B-cell lymphoma of mucosa-associated lymphoid tissue [MALT-lymphoma]: Secondary | ICD-10-CM | POA: Diagnosis not present

## 2018-06-08 DIAGNOSIS — R202 Paresthesia of skin: Secondary | ICD-10-CM | POA: Diagnosis not present

## 2018-06-08 DIAGNOSIS — R110 Nausea: Secondary | ICD-10-CM | POA: Diagnosis not present

## 2018-06-08 DIAGNOSIS — R200 Anesthesia of skin: Secondary | ICD-10-CM | POA: Diagnosis not present

## 2018-06-12 ENCOUNTER — Telehealth: Payer: Self-pay | Admitting: Internal Medicine

## 2018-06-12 NOTE — Telephone Encounter (Signed)
Copied from [place] [id number]. Topic: Quick Communication - See Telephone Encounter >> Jun 12, 2018 11:57 AM Rutherford Nail, NT wrote: CRM for notification. See Telephone encounter for: 06/12/18. Patient's wife, Vicente Males, calling and is stating that the pharmacy is needing a Prior Authorization on the zolpidem (AMBIEN CR) 12.5 MG CR tablet. States the PA was sent on Thursday (06/08/18) and again this morning (06/12/18). Please advise.  CB#:[phone]

## 2018-06-13 NOTE — Telephone Encounter (Signed)
PA has been submitted for Zolpidem on covermymeds.

## 2018-06-14 ENCOUNTER — Other Ambulatory Visit: Payer: Self-pay | Admitting: Internal Medicine

## 2018-06-14 NOTE — Telephone Encounter (Signed)
Refilled Zolpidem CR 12.5mg  on 03/14/2018 #[id number] Prior authorization was done for the 12.5mg  dose yesterday waiting on a response from insurance.   Refill request is for Zolpidem 10mg     Last OV: 03/14/2018 Next OV: not scheduled

## 2018-06-15 DIAGNOSIS — K5732 Diverticulitis of large intestine without perforation or abscess without bleeding: Secondary | ICD-10-CM | POA: Diagnosis not present

## 2018-06-15 DIAGNOSIS — C8580 Other specified types of non-Hodgkin lymphoma, unspecified site: Secondary | ICD-10-CM | POA: Diagnosis not present

## 2018-06-15 DIAGNOSIS — C8590 Non-Hodgkin lymphoma, unspecified, unspecified site: Secondary | ICD-10-CM | POA: Diagnosis not present

## 2018-06-15 DIAGNOSIS — J9811 Atelectasis: Secondary | ICD-10-CM | POA: Diagnosis not present

## 2018-06-15 NOTE — Telephone Encounter (Signed)
Printed, signed and faxed.  

## 2018-06-15 NOTE — Telephone Encounter (Signed)
Pt. Calling again,  To see if got PA yet. If so could send to [facility] If no PA has been done, he will need the old one for a month.  He is out of medication

## 2018-06-15 NOTE — Telephone Encounter (Signed)
PA has been submitted by Janett Billow waiting on approval.

## 2018-06-15 NOTE — Telephone Encounter (Signed)
Patient calling to check on the PA for the zolpidem. Informed him that it was submitted for approval today. Patient would like Janett Billow to give his wife a call back. Please advise.

## 2018-06-16 NOTE — Telephone Encounter (Signed)
Spoke with pt's wife to let her know that the Zolpidem CR was denied by insurance but that Dr. Derrel Nip had sent in the old zolpidem for him to take while we do an appeal to see if we can give the insurance company anymore information so maybe they will approve it. Wife gave a verbal understanding. Explained to wife that appeals do take time.

## 2018-06-21 DIAGNOSIS — R110 Nausea: Secondary | ICD-10-CM | POA: Diagnosis not present

## 2018-06-21 DIAGNOSIS — E860 Dehydration: Secondary | ICD-10-CM | POA: Diagnosis not present

## 2018-06-21 DIAGNOSIS — R1031 Right lower quadrant pain: Secondary | ICD-10-CM | POA: Diagnosis not present

## 2018-06-21 DIAGNOSIS — K5732 Diverticulitis of large intestine without perforation or abscess without bleeding: Secondary | ICD-10-CM | POA: Diagnosis not present

## 2018-06-21 DIAGNOSIS — K5792 Diverticulitis of intestine, part unspecified, without perforation or abscess without bleeding: Secondary | ICD-10-CM | POA: Diagnosis not present

## 2018-06-22 DIAGNOSIS — N179 Acute kidney failure, unspecified: Secondary | ICD-10-CM | POA: Diagnosis not present

## 2018-06-22 DIAGNOSIS — E860 Dehydration: Secondary | ICD-10-CM | POA: Diagnosis not present

## 2018-06-22 DIAGNOSIS — R110 Nausea: Secondary | ICD-10-CM | POA: Diagnosis not present

## 2018-06-23 DIAGNOSIS — Z6824 Body mass index (BMI) 24.0-24.9, adult: Secondary | ICD-10-CM | POA: Diagnosis not present

## 2018-06-23 DIAGNOSIS — K567 Ileus, unspecified: Secondary | ICD-10-CM | POA: Insufficient documentation

## 2018-06-23 DIAGNOSIS — E860 Dehydration: Secondary | ICD-10-CM | POA: Diagnosis not present

## 2018-06-23 DIAGNOSIS — K5901 Slow transit constipation: Secondary | ICD-10-CM | POA: Diagnosis not present

## 2018-06-23 DIAGNOSIS — R109 Unspecified abdominal pain: Secondary | ICD-10-CM | POA: Diagnosis not present

## 2018-06-23 DIAGNOSIS — R112 Nausea with vomiting, unspecified: Secondary | ICD-10-CM | POA: Diagnosis not present

## 2018-06-23 DIAGNOSIS — K5732 Diverticulitis of large intestine without perforation or abscess without bleeding: Secondary | ICD-10-CM | POA: Diagnosis not present

## 2018-06-23 DIAGNOSIS — N179 Acute kidney failure, unspecified: Secondary | ICD-10-CM | POA: Diagnosis not present

## 2018-06-24 MED ORDER — PANTOPRAZOLE SODIUM 40 MG PO TBEC
40.00 | DELAYED_RELEASE_TABLET | ORAL | Status: DC
Start: 2018-06-24 — End: 2018-06-24

## 2018-06-24 MED ORDER — ALUM & MAG HYDROXIDE-SIMETH 400-400-40 MG/5ML PO SUSP
30.00 | ORAL | Status: DC
Start: ? — End: 2018-06-24

## 2018-06-24 MED ORDER — LOSARTAN POTASSIUM 100 MG PO TABS
100.00 | ORAL_TABLET | ORAL | Status: DC
Start: 2018-06-25 — End: 2018-06-24

## 2018-06-24 MED ORDER — POLYETHYLENE GLYCOL 3350 17 G PO PACK
17.00 | PACK | ORAL | Status: DC
Start: 2018-06-24 — End: 2018-06-24

## 2018-06-24 MED ORDER — MELATONIN 3 MG PO TABS
5.00 | ORAL_TABLET | ORAL | Status: DC
Start: 2018-06-24 — End: 2018-06-24

## 2018-06-24 MED ORDER — CARVEDILOL 25 MG PO TABS
25.00 | ORAL_TABLET | ORAL | Status: DC
Start: 2018-06-24 — End: 2018-06-24

## 2018-06-24 MED ORDER — ACETAMINOPHEN 325 MG PO TABS
650.00 | ORAL_TABLET | ORAL | Status: DC
Start: ? — End: 2018-06-24

## 2018-06-24 MED ORDER — DOCUSATE SODIUM 100 MG PO CAPS
100.00 | ORAL_CAPSULE | ORAL | Status: DC
Start: 2018-06-24 — End: 2018-06-24

## 2018-06-24 MED ORDER — ZOLPIDEM TARTRATE 5 MG PO TABS
10.00 | ORAL_TABLET | ORAL | Status: DC
Start: 2018-06-24 — End: 2018-06-24

## 2018-06-24 MED ORDER — IPRATROPIUM BROMIDE 0.06 % NA SOLN
2.00 | NASAL | Status: DC
Start: ? — End: 2018-06-24

## 2018-06-24 MED ORDER — SODIUM CHLORIDE 0.9 % IV SOLN
75.00 | INTRAVENOUS | Status: DC
Start: ? — End: 2018-06-24

## 2018-06-24 MED ORDER — DICYCLOMINE HCL 10 MG PO CAPS
10.00 | ORAL_CAPSULE | ORAL | Status: DC
Start: 2018-06-24 — End: 2018-06-24

## 2018-06-24 MED ORDER — HYDROCORTISONE 2.5 % EX CREA
1.00 | TOPICAL_CREAM | CUTANEOUS | Status: DC
Start: 2018-06-24 — End: 2018-06-24

## 2018-06-24 MED ORDER — SIMETHICONE 80 MG PO CHEW
120.00 | CHEWABLE_TABLET | ORAL | Status: DC
Start: 2018-06-24 — End: 2018-06-24

## 2018-06-24 MED ORDER — ATORVASTATIN CALCIUM 20 MG PO TABS
20.00 | ORAL_TABLET | ORAL | Status: DC
Start: 2018-06-24 — End: 2018-06-24

## 2018-06-24 MED ORDER — AMLODIPINE BESYLATE 5 MG PO TABS
5.00 | ORAL_TABLET | ORAL | Status: DC
Start: 2018-06-25 — End: 2018-06-24

## 2018-06-24 MED ORDER — GLYCERIN (ADULT) 2 G RE SUPP
1.00 | RECTAL | Status: DC
Start: ? — End: 2018-06-24

## 2018-06-24 MED ORDER — AMOXICILLIN-POT CLAVULANATE 875-125 MG PO TABS
1.00 | ORAL_TABLET | ORAL | Status: DC
Start: 2018-06-24 — End: 2018-06-24

## 2018-06-24 MED ORDER — HEPARIN SODIUM (PORCINE) 5000 UNIT/ML IJ SOLN
5000.00 | INTRAMUSCULAR | Status: DC
Start: 2018-06-24 — End: 2018-06-24

## 2018-06-24 MED ORDER — FLUTICASONE PROPIONATE 50 MCG/ACT NA SUSP
1.00 | NASAL | Status: DC
Start: ? — End: 2018-06-24

## 2018-06-24 MED ORDER — ONDANSETRON 4 MG PO TBDP
4.00 | ORAL_TABLET | ORAL | Status: DC
Start: ? — End: 2018-06-24

## 2018-06-28 DIAGNOSIS — R112 Nausea with vomiting, unspecified: Secondary | ICD-10-CM | POA: Diagnosis not present

## 2018-06-28 DIAGNOSIS — Z6824 Body mass index (BMI) 24.0-24.9, adult: Secondary | ICD-10-CM | POA: Diagnosis not present

## 2018-07-05 ENCOUNTER — Ambulatory Visit: Payer: Self-pay | Admitting: Internal Medicine

## 2018-07-05 DIAGNOSIS — R1031 Right lower quadrant pain: Secondary | ICD-10-CM | POA: Diagnosis not present

## 2018-07-05 DIAGNOSIS — K5732 Diverticulitis of large intestine without perforation or abscess without bleeding: Secondary | ICD-10-CM | POA: Diagnosis not present

## 2018-07-05 NOTE — Telephone Encounter (Signed)
Pt c/o right lower and mid abdominal pain. Pain began last night. Pt states the pain comes and goes and severity of pain is mild(when pain goes away) and moderate when pain is at its peak. Pt just finished  course of Augmentin 1 day ago. Pt has similar sx and was hospitalized due to dehydration. Pt stated that he is drinking enough fluids and is voiding. Pt was diagnosed with right side diverticulitis by CT scan and was hospitalized with dehydration. Pt is taking Zofran sublingual for nausea. Pt states he took 4 bites of oatmeal and 4 spoons of soup today and has kept that down.  He stated eating causes him more discomfort.  Care advice given per protocol and pt verbalized understanding. Appt made for pt with Dr McLean-Scocuzza tomorrow. Advised to go to the ED if pain worsens or unable to keep fluids down.  Reason for Disposition . [1] MODERATE pain (e.g., interferes with normal activities) AND [2] pain comes and goes (cramps) AND [3] present > 24 hours  (Exception: pain with Vomiting or Diarrhea - see that Guideline)  Answer Assessment - Initial Assessment Questions 1. LOCATION: "Where does it hurt?"      Right lower and in the middle  2. RADIATION: "Does the pain shoot anywhere else?" (e.g., chest, back)     no 3. ONSET: "When did the pain begin?" (Minutes, hours or days ago)      Last night and today 4. SUDDEN: "Gradual or sudden onset?"     gradual 5. PATTERN "Does the pain come and go, or is it constant?"    - If constant: "Is it getting better, staying the same, or worsening?"      (Note: Constant means the pain never goes away completely; most serious pain is constant and it progresses)     - If intermittent: "How long does it last?" "Do you have pain now?"     (Note: Intermittent means the pain goes away completely between bouts)     Comes and goes 6. SEVERITY: "How bad is the pain?"  (e.g., Scale 1-10; mild, moderate, or severe)    - MILD (1-3): doesn't interfere with normal activities,  abdomen soft and not tender to touch     - MODERATE (4-7): interferes with normal activities or awakens from sleep, tender to touch     - SEVERE (8-10): excruciating pain, doubled over, unable to do any normal activities       Mild to moderate 7. RECURRENT SYMPTOM: "Have you ever had this type of abdominal pain before?" If so, ask: "When was the last time?" and "What happened that time?"      Yes-1.5 weeks ago- given abx and CT scan 8. CAUSE: "What do you think is causing the abdominal pain?"     diverticulitis 9. RELIEVING/AGGRAVATING FACTORS: "What makes it better or worse?" (e.g., movement, antacids, bowel movement)     Better: laying down pain comes in waves, taking Zofran oral disintegrates 10. OTHER SYMPTOMS: "Has there been any vomiting, diarrhea, constipation, or urine problems?"       Vomiting, diarrhea, poor appetite  Protocols used: ABDOMINAL PAIN - MALE-A-AH

## 2018-07-06 ENCOUNTER — Ambulatory Visit: Payer: 59 | Admitting: Internal Medicine

## 2018-07-06 DIAGNOSIS — K5900 Constipation, unspecified: Secondary | ICD-10-CM | POA: Diagnosis not present

## 2018-07-06 DIAGNOSIS — R112 Nausea with vomiting, unspecified: Secondary | ICD-10-CM | POA: Diagnosis not present

## 2018-07-06 DIAGNOSIS — K5790 Diverticulosis of intestine, part unspecified, without perforation or abscess without bleeding: Secondary | ICD-10-CM | POA: Diagnosis not present

## 2018-07-06 DIAGNOSIS — I10 Essential (primary) hypertension: Secondary | ICD-10-CM | POA: Diagnosis not present

## 2018-07-07 DIAGNOSIS — I10 Essential (primary) hypertension: Secondary | ICD-10-CM | POA: Diagnosis not present

## 2018-07-07 DIAGNOSIS — K5900 Constipation, unspecified: Secondary | ICD-10-CM | POA: Diagnosis not present

## 2018-07-07 DIAGNOSIS — C884 Extranodal marginal zone B-cell lymphoma of mucosa-associated lymphoid tissue [MALT-lymphoma]: Secondary | ICD-10-CM | POA: Diagnosis not present

## 2018-07-07 DIAGNOSIS — I2510 Atherosclerotic heart disease of native coronary artery without angina pectoris: Secondary | ICD-10-CM | POA: Diagnosis not present

## 2018-07-07 DIAGNOSIS — R1031 Right lower quadrant pain: Secondary | ICD-10-CM | POA: Diagnosis not present

## 2018-07-07 DIAGNOSIS — E785 Hyperlipidemia, unspecified: Secondary | ICD-10-CM | POA: Diagnosis not present

## 2018-07-07 DIAGNOSIS — K5720 Diverticulitis of large intestine with perforation and abscess without bleeding: Secondary | ICD-10-CM | POA: Diagnosis not present

## 2018-07-07 DIAGNOSIS — K5792 Diverticulitis of intestine, part unspecified, without perforation or abscess without bleeding: Secondary | ICD-10-CM | POA: Diagnosis not present

## 2018-07-07 DIAGNOSIS — K5732 Diverticulitis of large intestine without perforation or abscess without bleeding: Secondary | ICD-10-CM | POA: Diagnosis not present

## 2018-07-12 DIAGNOSIS — K5732 Diverticulitis of large intestine without perforation or abscess without bleeding: Secondary | ICD-10-CM | POA: Diagnosis not present

## 2018-07-12 DIAGNOSIS — R1031 Right lower quadrant pain: Secondary | ICD-10-CM | POA: Diagnosis not present

## 2018-07-12 MED ORDER — SPIRONOLACTONE 25 MG PO TABS
100.00 | ORAL_TABLET | ORAL | Status: DC
Start: 2018-07-13 — End: 2018-07-12

## 2018-07-12 MED ORDER — PIPERACILLIN-TAZOBACTAM 3.375 G IVPB
3.38 | INTRAVENOUS | Status: DC
Start: 2018-07-12 — End: 2018-07-12

## 2018-07-12 MED ORDER — LOSARTAN POTASSIUM 100 MG PO TABS
100.00 | ORAL_TABLET | ORAL | Status: DC
Start: 2018-07-12 — End: 2018-07-12

## 2018-07-12 MED ORDER — OXYCODONE HCL 5 MG PO TABS
5.00 | ORAL_TABLET | ORAL | Status: DC
Start: ? — End: 2018-07-12

## 2018-07-12 MED ORDER — GENERIC EXTERNAL MEDICATION
2.00 | Status: DC
Start: ? — End: 2018-07-12

## 2018-07-12 MED ORDER — PROCHLORPERAZINE MALEATE 5 MG PO TABS
5.00 | ORAL_TABLET | ORAL | Status: DC
Start: ? — End: 2018-07-12

## 2018-07-12 MED ORDER — POLYETHYLENE GLYCOL 3350 17 G PO PACK
17.00 | PACK | ORAL | Status: DC
Start: ? — End: 2018-07-12

## 2018-07-12 MED ORDER — SIMETHICONE 80 MG PO CHEW
160.00 | CHEWABLE_TABLET | ORAL | Status: DC
Start: 2018-07-12 — End: 2018-07-12

## 2018-07-12 MED ORDER — DICYCLOMINE HCL 10 MG PO CAPS
10.00 | ORAL_CAPSULE | ORAL | Status: DC
Start: ? — End: 2018-07-12

## 2018-07-12 MED ORDER — PANTOPRAZOLE SODIUM 40 MG PO TBEC
40.00 | DELAYED_RELEASE_TABLET | ORAL | Status: DC
Start: 2018-07-12 — End: 2018-07-12

## 2018-07-12 MED ORDER — POLYETHYLENE GLYCOL 3350 17 G PO PACK
17.00 | PACK | ORAL | Status: DC
Start: 2018-07-12 — End: 2018-07-12

## 2018-07-12 MED ORDER — BISACODYL 5 MG PO TBEC
10.00 | DELAYED_RELEASE_TABLET | ORAL | Status: DC
Start: ? — End: 2018-07-12

## 2018-07-12 MED ORDER — ATORVASTATIN CALCIUM 20 MG PO TABS
20.00 | ORAL_TABLET | ORAL | Status: DC
Start: 2018-07-13 — End: 2018-07-12

## 2018-07-12 MED ORDER — ONDANSETRON 4 MG PO TBDP
8.00 | ORAL_TABLET | ORAL | Status: DC
Start: 2018-07-12 — End: 2018-07-12

## 2018-07-12 MED ORDER — MELATONIN 3 MG PO TABS
6.00 | ORAL_TABLET | ORAL | Status: DC
Start: 2018-07-12 — End: 2018-07-12

## 2018-07-12 MED ORDER — ACETAMINOPHEN 500 MG PO TABS
1000.00 | ORAL_TABLET | ORAL | Status: DC
Start: ? — End: 2018-07-12

## 2018-07-12 MED ORDER — CARVEDILOL 25 MG PO TABS
25.00 | ORAL_TABLET | ORAL | Status: DC
Start: 2018-07-12 — End: 2018-07-12

## 2018-07-12 MED ORDER — SIMETHICONE 80 MG PO CHEW
120.00 | CHEWABLE_TABLET | ORAL | Status: DC
Start: ? — End: 2018-07-12

## 2018-07-12 MED ORDER — FLUTICASONE PROPIONATE 50 MCG/ACT NA SUSP
1.00 | NASAL | Status: DC
Start: ? — End: 2018-07-12

## 2018-07-12 MED ORDER — ZOLPIDEM TARTRATE 5 MG PO TABS
10.00 | ORAL_TABLET | ORAL | Status: DC
Start: ? — End: 2018-07-12

## 2018-07-12 MED ORDER — DIBUCAINE 1 % EX OINT
TOPICAL_OINTMENT | CUTANEOUS | Status: DC
Start: ? — End: 2018-07-12

## 2018-07-13 DIAGNOSIS — K5740 Diverticulitis of both small and large intestine with perforation and abscess without bleeding: Secondary | ICD-10-CM | POA: Diagnosis not present

## 2018-07-13 DIAGNOSIS — K567 Ileus, unspecified: Secondary | ICD-10-CM | POA: Diagnosis not present

## 2018-07-13 DIAGNOSIS — K5732 Diverticulitis of large intestine without perforation or abscess without bleeding: Secondary | ICD-10-CM | POA: Diagnosis not present

## 2018-07-13 DIAGNOSIS — R1031 Right lower quadrant pain: Secondary | ICD-10-CM | POA: Diagnosis not present

## 2018-07-13 DIAGNOSIS — I10 Essential (primary) hypertension: Secondary | ICD-10-CM | POA: Diagnosis not present

## 2018-07-14 ENCOUNTER — Telehealth: Payer: Self-pay

## 2018-07-14 DIAGNOSIS — R1031 Right lower quadrant pain: Secondary | ICD-10-CM | POA: Diagnosis not present

## 2018-07-14 DIAGNOSIS — K5732 Diverticulitis of large intestine without perforation or abscess without bleeding: Secondary | ICD-10-CM | POA: Diagnosis not present

## 2018-07-14 NOTE — Telephone Encounter (Signed)
LMTCB. Please transfer Brian Atkins to our office.

## 2018-07-14 NOTE — Telephone Encounter (Signed)
Spoke with Inez Catalina and she stated she will be faxing over some paperwork to be signed from her home visits she has had with pt since his hospital discharge. Pt had a pic line placed before discharge and will have it removed next week.

## 2018-07-14 NOTE — Telephone Encounter (Signed)
Copied from [place] [phone]. Topic: General - Other >> Jul 14, 2018 10:02 AM Judyann Munson wrote: Reason for CRM: Veronda Prude is calling for home health paper work to be signed over . Best contact number [phone]

## 2018-07-15 DIAGNOSIS — R1031 Right lower quadrant pain: Secondary | ICD-10-CM | POA: Diagnosis not present

## 2018-07-15 DIAGNOSIS — K5732 Diverticulitis of large intestine without perforation or abscess without bleeding: Secondary | ICD-10-CM | POA: Diagnosis not present

## 2018-07-16 DIAGNOSIS — K5732 Diverticulitis of large intestine without perforation or abscess without bleeding: Secondary | ICD-10-CM | POA: Diagnosis not present

## 2018-07-16 DIAGNOSIS — R1031 Right lower quadrant pain: Secondary | ICD-10-CM | POA: Diagnosis not present

## 2018-07-17 DIAGNOSIS — R1031 Right lower quadrant pain: Secondary | ICD-10-CM | POA: Diagnosis not present

## 2018-07-17 DIAGNOSIS — K5732 Diverticulitis of large intestine without perforation or abscess without bleeding: Secondary | ICD-10-CM | POA: Diagnosis not present

## 2018-07-18 DIAGNOSIS — R1031 Right lower quadrant pain: Secondary | ICD-10-CM | POA: Diagnosis not present

## 2018-07-18 DIAGNOSIS — K5740 Diverticulitis of both small and large intestine with perforation and abscess without bleeding: Secondary | ICD-10-CM | POA: Diagnosis not present

## 2018-07-18 DIAGNOSIS — K567 Ileus, unspecified: Secondary | ICD-10-CM | POA: Diagnosis not present

## 2018-07-18 DIAGNOSIS — I10 Essential (primary) hypertension: Secondary | ICD-10-CM | POA: Diagnosis not present

## 2018-07-18 DIAGNOSIS — K5732 Diverticulitis of large intestine without perforation or abscess without bleeding: Secondary | ICD-10-CM | POA: Diagnosis not present

## 2018-07-19 DIAGNOSIS — R1031 Right lower quadrant pain: Secondary | ICD-10-CM | POA: Diagnosis not present

## 2018-07-19 DIAGNOSIS — K5732 Diverticulitis of large intestine without perforation or abscess without bleeding: Secondary | ICD-10-CM | POA: Diagnosis not present

## 2018-07-20 ENCOUNTER — Encounter: Payer: Self-pay | Admitting: Internal Medicine

## 2018-07-20 ENCOUNTER — Ambulatory Visit (INDEPENDENT_AMBULATORY_CARE_PROVIDER_SITE_OTHER): Payer: 59 | Admitting: Internal Medicine

## 2018-07-20 VITALS — BP 116/76 | HR 63 | Temp 98.1°F | Resp 15 | Ht 74.0 in | Wt 188.8 lb

## 2018-07-20 DIAGNOSIS — I10 Essential (primary) hypertension: Secondary | ICD-10-CM

## 2018-07-20 DIAGNOSIS — N179 Acute kidney failure, unspecified: Secondary | ICD-10-CM | POA: Diagnosis not present

## 2018-07-20 DIAGNOSIS — K5792 Diverticulitis of intestine, part unspecified, without perforation or abscess without bleeding: Secondary | ICD-10-CM

## 2018-07-20 DIAGNOSIS — Z09 Encounter for follow-up examination after completed treatment for conditions other than malignant neoplasm: Secondary | ICD-10-CM

## 2018-07-20 DIAGNOSIS — A09 Infectious gastroenteritis and colitis, unspecified: Secondary | ICD-10-CM

## 2018-07-20 DIAGNOSIS — K5732 Diverticulitis of large intestine without perforation or abscess without bleeding: Secondary | ICD-10-CM | POA: Diagnosis not present

## 2018-07-20 DIAGNOSIS — R1031 Right lower quadrant pain: Secondary | ICD-10-CM | POA: Diagnosis not present

## 2018-07-20 LAB — COMPREHENSIVE METABOLIC PANEL
ALT: 15 U/L (ref 0–53)
AST: 14 U/L (ref 0–37)
Albumin: 4.3 g/dL (ref 3.5–5.2)
Alkaline Phosphatase: 60 U/L (ref 39–117)
BUN: 16 mg/dL (ref 6–23)
CALCIUM: 10.1 mg/dL (ref 8.4–10.5)
CHLORIDE: 102 meq/L (ref 96–112)
CO2: 28 meq/L (ref 19–32)
Creatinine, Ser: 1.32 mg/dL (ref 0.40–1.50)
GFR: 58.43 mL/min — AB (ref >60.00)
GLUCOSE: 107 mg/dL — AB (ref 70–99)
Potassium: 4.8 mEq/L (ref 3.5–5.1)
Sodium: 139 mEq/L (ref 135–145)
Total Bilirubin: 0.5 mg/dL (ref 0.2–1.2)
Total Protein: 7.3 g/dL (ref 6.0–8.3)

## 2018-07-20 LAB — CBC WITH DIFFERENTIAL/PLATELET
BASOS ABS: 0 10*3/uL (ref 0.0–0.1)
BASOS PCT: 0.6 % (ref 0.0–3.0)
EOS ABS: 0.1 10*3/uL (ref 0.0–0.7)
Eosinophils Relative: 1.9 % (ref 0.0–5.0)
HCT: 44.7 % (ref 39.0–52.0)
Hemoglobin: 15.2 g/dL (ref 13.0–17.0)
LYMPHS ABS: 2.6 10*3/uL (ref 0.7–4.0)
Lymphocytes Relative: 35.7 % (ref 12.0–46.0)
MCHC: 34.1 g/dL (ref 30.0–36.0)
MCV: 90.7 fl (ref 78.0–100.0)
MONOS PCT: 12.6 % — AB (ref 3.0–12.0)
Monocytes Absolute: 0.9 10*3/uL (ref 0.1–1.0)
NEUTROS PCT: 49.2 % (ref 43.0–77.0)
Neutro Abs: 3.6 10*3/uL (ref 1.4–7.7)
PLATELETS: 420 10*3/uL — AB (ref 150.0–400.0)
RBC: 4.92 Mil/uL (ref 4.22–5.81)
RDW: 14.4 % (ref 11.5–15.5)
WBC: 7.2 10*3/uL (ref 4.0–10.5)

## 2018-07-20 LAB — MAGNESIUM: Magnesium: 2.1 mg/dL (ref 1.5–2.5)

## 2018-07-20 MED ORDER — AMLODIPINE BESYLATE 2.5 MG PO TABS: 2.5000 mg | ORAL_TABLET | Freq: Every day | ORAL | 3 refills | Status: DC

## 2018-07-20 MED ORDER — ALPRAZOLAM 0.25 MG PO TABS: 0.2500 mg | ORAL_TABLET | Freq: Every evening | ORAL | 1 refills | Status: DC | PRN

## 2018-07-20 NOTE — Patient Instructions (Addendum)
If your pulse starts to stay below 50,  Reduce the carvedilol by half  Twice daily   You can resume amlodipine I needed for high blood pressure (2.5. Mg )  (bp greater than 140/80)    I am prescribing 0.25 mg alprazolam  As needed for early waking.

## 2018-07-20 NOTE — Progress Notes (Signed)
Subjective:  Patient ID: Brian Atkins, male    DOB: 1956-01-01  Age: 62 y.o. MRN: [id number]  CC: The primary encounter diagnosis was Acute kidney injury ([place]). Diagnoses of Acute diverticulitis, Diarrhea of infectious origin, Hospital discharge follow-up, and Essential hypertension were also pertinent to this visit.  HPI Brian Atkins presents for hospital follow up.  Patient was admitted to [facility] twice within the past month with diverticulitis, first in [facility] , then in [place] .  The first admission occurred on July 3 and the admitting diagnosis was ileus attributed initially to known severe slow transit constipation.  Diverticulitis was incidentally found on CT done to rule out obstruction given history of MALT lymphoma, and he was treated with Bactrim and flagyl which was not tolerated  To due nausea/emesis, and  switched to augmenti nprior to discharge home on July 6 . plain films on July 5 still suggestive of ileus with retained stool.  AT GI follow up on July 10 miralax was tapered down . prucalopride 2 mg daily was continued for slow transit and colonoscopy was scheduled for July 23  .   Readmitted on July 17 to [place] with persistent nausea, vomiting, AKI, and repeat CT noted  diverticulitis  now with colonic edema and microperforation.   He was seen by [facility] Surgery .  He was treated with zofran,  Anti emetics,  And iv fluids. He was unable to tolerate any oral antibiotics and was discharged home on July 24 with IV Zofran via PICC Line until Aug 3    Repeat CT scan is planned for next week with pre treatment prednisone for contrast allery   He continues to feels exhausted,  Trying to work part time.  Persistent Nausea,  Aggravated by lying down, and by most foods.   .  Zofran was changed to sublingual .  Tolerating 30 ounces of apple juice daily,  ,  Ham and creamed potatoes.  havng persistnt early wakening despite using ambien.  Insurance  would not cover Ambien CR  Reviewed BP meds given current vital signs and fatigue.     Outpatient Medications Prior to Visit  Medication Sig Dispense Refill  . Alpha Lipoic Acid 200 MG CAPS Take 1 capsule by mouth 3 (three) times daily. 90 capsule 2  . cyanocobalamin (,VITAMIN B-12,) 1000 MCG/ML injection Inject 1 mL (1,000 mcg total) into the muscle daily. For 3 days,  Then weekly for 4 weeks ,  Then monthly thereafter 10 mL 2  . dicyclomine (BENTYL) 10 MG capsule TAKE ONE CAPSULE BY MOUTH 3 TIMES A DAY AS NEEDED FOR CRAMPING  0  . esomeprazole (NEXIUM) 40 MG capsule Take 40 mg by mouth daily at 12 noon.    . famotidine-calcium carbonate-magnesium hydroxide (PEPCID COMPLETE) 10-800-165 MG CHEW chewable tablet Chew 1 tablet by mouth 4 (four) times daily.    [name] GAVILYTE-N WITH FLAVOR PACK 420 g solution See admin instructions. see package  0  . ipratropium (ATROVENT) 0.06 % nasal spray Place 2 sprays into both nostrils 4 (four) times daily. 15 mL 12  . isosorbide mononitrate (IMDUR) 30 MG 24 hr tablet Take 30 mg by mouth daily.  3  . LORazepam (ATIVAN) 1 MG tablet Take 1 tablet (1 mg total) by mouth every 8 (eight) hours as needed. 30 tablet 3  . losartan (COZAAR) 100 MG tablet Take 1 tablet (100 mg total) by mouth daily. 90 tablet 2  .  Melatonin 3 MG TABS Take 1 tablet by mouth at bedtime.     . ondansetron (ZOFRAN) 8 MG tablet Take 1 tablet (8 mg total) by mouth daily as needed. for nausea 90 tablet 1  . ondansetron (ZOFRAN-ODT) 8 MG disintegrating tablet TAKE 1 TABLET (8 MG TOTAL) BY MOUTH THREE (3) TIMES A DAY. FOR 7 DAYS  0  . piperacillin-tazobactam (ZOSYN) 3.375 GM/50ML IVPB Inject into the vein.    [name] prochlorperazine (COMPAZINE) 5 MG tablet TAKE 1 TABLET (5 MG TOTAL) BY MOUTH EVERY SIX (6) HOURS AS NEEDED. FOR UP TO 7 DAYS  0  . spironolactone (ALDACTONE) 25 MG tablet Take 1 tablet (25 mg total) by mouth daily. 30 tablet 6  . Syringe/Needle, Disp, (SYRINGE 3CC/25GX1") 25G X 1" 3 ML MISC  Use for b12 injections 50 each 0  . triamcinolone cream (KENALOG) 0.5 % Apply 1 application 2 (two) times daily as needed topically. (Patient taking differently: Apply 1 application topically 2 (two) times daily as needed (skin irritations). ) 30 g 0  . zolpidem (AMBIEN) 10 MG tablet TAKE 1 TABLET BY MOUTH AT BEDTIME 30 tablet 3  . aspirin EC 81 MG tablet Take 1 tablet (81 mg total) by mouth daily. (Patient not taking: Reported on 07/20/2018) 90 tablet 3  . atorvastatin (LIPITOR) 20 MG tablet Take 1 tablet (20 mg total) by mouth daily. 90 tablet 3  . carvedilol (COREG) 25 MG tablet Take 1 tablet (25 mg total) by mouth 2 (two) times daily. (Patient taking differently: Take 25 mg by mouth 2 (two) times daily. ) 180 tablet 3  . nitroGLYCERIN (NITROSTAT) 0.4 MG SL tablet Place 1 tablet (0.4 mg total) under the tongue every 5 (five) minutes as needed for chest pain. Maximum of 3 doses. 35 tablet 3  . amLODipine (NORVASC) 5 MG tablet Take 1 tablet (5 mg total) by mouth daily. (Patient not taking: Reported on 07/20/2018) 90 tablet 3  . benzonatate (TESSALON) 200 MG capsule Take 1 capsule (200 mg total) 3 (three) times daily as needed by mouth. (Patient not taking: Reported on 07/20/2018) 30 capsule 0  . bisacodyl (DULCOLAX) 5 MG EC tablet Take 10 mg by mouth daily as needed for mild constipation.     . fexofenadine (ALLEGRA) 180 MG tablet Take 180 mg by mouth daily.    . furosemide (LASIX) 20 MG tablet Take 1 tablet (20 mg total) by mouth daily. (Patient not taking: Reported on 07/20/2018) 30 tablet 11  . LINZESS 290 MCG CAPS capsule TAKE 1 CAPSULE (290 MCG TOTAL) BY MOUTH DAILY BEFORE BREAKFAST. (Patient not taking: Reported on 07/20/2018) 30 capsule 5  . magnesium oxide (MAG-OX) 400 MG tablet Take 400 mg by mouth daily.    [name] zolpidem (AMBIEN CR) 12.5 MG CR tablet Take 1 tablet (12.5 mg total) by mouth at bedtime as needed for sleep. (Patient not taking: Reported on 07/20/2018) 30 tablet 4   No facility-administered  medications prior to visit.     Review of Systems;  Patient denies headache, fevers, malaise, unintentional weight loss, skin rash, eye pain, sinus congestion and sinus pain, sore throat, dysphagia,  hemoptysis , cough, dyspnea, wheezing, chest pain, palpitations, orthopnea, edema, abdominal pain, nausea, melena, diarrhea, constipation, flank pain, dysuria, hematuria, urinary  Frequency, nocturia, numbness, tingling, seizures,  Focal weakness, Loss of consciousness,  Tremor, insomnia, depression, anxiety, and suicidal ideation.      Objective:  BP 116/76 (BP Location: Left Arm, Patient Position: Sitting, Cuff Size: Normal)  Pulse 63   Temp 98.1 F (36.7 C) (Oral)   Resp 15   Ht 6\' 2"  (1.88 m)   Wt 188 lb 12.8 oz (85.6 kg)   SpO2 98%   BMI 24.24 kg/m   BP Readings from Last 3 Encounters:  07/20/18 116/76  05/04/18 (!) 136/94  03/14/18 112/78    Wt Readings from Last 3 Encounters:  07/20/18 188 lb 12.8 oz (85.6 kg)  05/04/18 198 lb 8 oz (90 kg)  03/14/18 197 lb 9.6 oz (89.6 kg)    General appearance: alert, cooperative and appears stated age Ears: normal TM's and external ear canals both ears Throat: lips, mucosa, and tongue normal; teeth and gums normal Neck: no adenopathy, no carotid bruit, supple, symmetrical, trachea midline and thyroid not enlarged, symmetric, no tenderness/mass/nodules Back: symmetric, no curvature. ROM normal. No CVA tenderness. Lungs: clear to auscultation bilaterally Heart: regular rate and rhythm, S1, S2 normal, no murmur, click, rub or gallop Abdomen: soft, non-tender; bowel sounds normal; no masses,  no organomegaly Pulses: 2+ and symmetric Skin: Skin color, texture, turgor normal. No rashes or lesions Lymph nodes: Cervical, supraclavicular, and axillary nodes normal.  Lab Results  Component Value Date   HGBA1C 5.8 11/21/2017   HGBA1C 5.0 03/02/2013   HGBA1C 5.0 10/25/2011    Lab Results  Component Value Date   CREATININE 1.32  07/20/2018   CREATININE 1.34 (H) 02/21/2018   CREATININE 1.20 12/19/2017    Lab Results  Component Value Date   WBC 7.2 07/20/2018   HGB 15.2 07/20/2018   HCT 44.7 07/20/2018   PLT 420.0 (H) 07/20/2018   GLUCOSE 107 (H) 07/20/2018   CHOL 212 (H) 03/02/2013   TRIG 168.0 (H) 03/02/2013   HDL 27.70 (L) 03/02/2013   LDLDIRECT 135.8 03/02/2013   LDLCALC 83 10/25/2011   ALT 15 07/20/2018   AST 14 07/20/2018   NA 139 07/20/2018   K 4.8 07/20/2018   CL 102 07/20/2018   CREATININE 1.32 07/20/2018   BUN 16 07/20/2018   CO2 28 07/20/2018   TSH 1.99 11/21/2017   PSA 1.92 03/02/2013   INR 1.0 11/30/2017   HGBA1C 5.8 11/21/2017    Mr Cervical Spine Wo Contrast  Result Date: 03/28/2018 CLINICAL DATA:  62 year old male with bilateral hand and foot numbness. Dizziness for 2 months. No known injury. EXAM: MRI CERVICAL SPINE WITHOUT CONTRAST TECHNIQUE: Multiplanar, multisequence MR imaging of the cervical spine was performed. No intravenous contrast was administered. COMPARISON:  Cervical spine radiographs 03/14/2018. Face CT 05/15/2014. FINDINGS: Alignment: Mild straightening of cervical lordosis, similar to the prior radiographs. No spondylolisthesis. Vertebrae: Mild degenerative endplate marrow signal changes in the lower cervical spine. No marrow edema or evidence of acute osseous abnormality. Cord: Spinal cord signal is within normal limits at all visualized levels. Posterior Fossa, vertebral arteries, paraspinal tissues: Cervicomedullary junction is within normal limits. Negative visible posterior fossa. Preserved major vascular flow voids in the neck. The left vertebral artery is dominant. Negative neck soft tissues and visible right lung apex. Disc levels: C2-C3:  Negative. C3-C4: Right paracentral and foraminal disc bulge and endplate spurring. Mild facet hypertrophy greater on the left. No spinal stenosis. Mild right C4 foraminal stenosis. C4-C5: Mild circumferential disc bulge and endplate  spurring. No spinal stenosis. Mild bilateral C5 foraminal stenosis. C5-C6: Mild circumferential disc bulge and endplate spurring. Mild facet hypertrophy. No spinal stenosis. Mild to moderate left and mild right C6 foraminal stenosis. C6-C7: Mild circumferential disc bulge and endplate spurring. Broad-based posterior component of  disc without spinal stenosis. Mild left greater than right C7 foraminal stenosis. C7-T1:  Mild facet and ligament flavum hypertrophy.  No stenosis. No upper thoracic spinal stenosis. IMPRESSION: No acute osseous abnormality and mild for age overall cervical spine degeneration. No spinal stenosis, and predominately mild cervical neural foraminal stenosis. Electronically Signed   By: Genevie Ann M.D.   On: 03/28/2018 19:27    Assessment & Plan:   Problem List Items Addressed This Visit    Essential hypertension    Advised to reduce carvedilol dose by 50% for pulse < 50 , and to increase amlodipine if needed for BP > 140      Relevant Medications   isosorbide mononitrate (IMDUR) 30 MG 24 hr tablet   amLODipine (NORVASC) 2.5 MG tablet   Hospital discharge follow-up    Following discharge on July 24 from [facility] with home health.  Patient is stable post discharge and has no new issues or questions about discharge plans at the visit today for hospital follow up. All labs , imaging studies and progress notes from admission were reviewed with patient today.  CBC , BMET and Mg were all repeated today and normal.   Lab Results  Component Value Date   WBC 7.2 07/20/2018   HGB 15.2 07/20/2018   HCT 44.7 07/20/2018   MCV 90.7 07/20/2018   PLT 420.0 (H) 07/20/2018   Lab Results  Component Value Date   NA 139 07/20/2018   K 4.8 07/20/2018   CL 102 07/20/2018   CO2 28 07/20/2018   Lab Results  Component Value Date   CREATININE 1.32 07/20/2018            Other Visit Diagnoses    Acute kidney injury ([place])    -  Primary   Relevant Orders   Comprehensive metabolic panel  (Completed)   Acute diverticulitis       Relevant Medications   piperacillin-tazobactam (ZOSYN) 3.375 GM/50ML IVPB   prochlorperazine (COMPAZINE) 5 MG tablet   Other Relevant Orders   CBC with Differential/Platelet (Completed)   Diarrhea of infectious origin       Relevant Orders   Magnesium (Completed)      I have discontinued Cristopher Estimable. [place]'s fexofenadine, bisacodyl, magnesium oxide, LINZESS, benzonatate, furosemide, and amLODipine. I am also having him start on amLODipine and ALPRAZolam. Additionally, I am having him maintain his esomeprazole, famotidine-calcium carbonate-magnesium hydroxide, LORazepam, Melatonin, ipratropium, ondansetron, triamcinolone cream, aspirin EC, nitroGLYCERIN, atorvastatin, losartan, carvedilol, spironolactone, cyanocobalamin, SYRINGE 3CC/25GX1", Alpha Lipoic Acid, zolpidem, dicyclomine, isosorbide mononitrate, ondansetron, GAVILYTE-N WITH FLAVOR PACK, piperacillin-tazobactam, and prochlorperazine.  Meds ordered this encounter  Medications  . amLODipine (NORVASC) 2.5 MG tablet    Sig: Take 1 tablet (2.5 mg total) by mouth daily.    Dispense:  90 tablet    Refill:  3  . ALPRAZolam (XANAX) 0.25 MG tablet    Sig: Take 1 tablet (0.25 mg total) by mouth at bedtime as needed for anxiety.    Dispense:  30 tablet    Refill:  1    Medications Discontinued During This Encounter  Medication Reason  . benzonatate (TESSALON) 200 MG capsule Completed Course  . bisacodyl (DULCOLAX) 5 MG EC tablet Patient has not taken in last 30 days  . fexofenadine (ALLEGRA) 180 MG tablet Patient has not taken in last 30 days  . furosemide (LASIX) 20 MG tablet Patient has not taken in last 30 days  . LINZESS 290 MCG CAPS capsule Patient has not taken in last 30  days  . magnesium oxide (MAG-OX) 400 MG tablet Patient has not taken in last 30 days  . zolpidem (AMBIEN CR) 12.5 MG CR tablet Patient has not taken in last 30 days  . amLODipine (NORVASC) 5 MG tablet     Follow-up: No  follow-ups on file.   [name], MD

## 2018-07-21 ENCOUNTER — Telehealth: Payer: Self-pay

## 2018-07-21 DIAGNOSIS — K5732 Diverticulitis of large intestine without perforation or abscess without bleeding: Secondary | ICD-10-CM | POA: Diagnosis not present

## 2018-07-21 DIAGNOSIS — R1031 Right lower quadrant pain: Secondary | ICD-10-CM | POA: Diagnosis not present

## 2018-07-21 NOTE — Telephone Encounter (Signed)
Copied from [place] [id number]. Topic: General - Other >> Jul 21, 2018  3:56 PM Judyann Munson wrote: Reason for CRM: Veronda Prude is calling for home health calling to advise the patient would like wait  till Tuesday to have his Peripherally Inserted Central Catheter (PICC) Line remove. Please advise  CB# [phone]

## 2018-07-22 DIAGNOSIS — Z09 Encounter for follow-up examination after completed treatment for conditions other than malignant neoplasm: Secondary | ICD-10-CM | POA: Insufficient documentation

## 2018-07-22 NOTE — Assessment & Plan Note (Addendum)
Following discharge on July 24 from [facility] with home health.  Patient is stable post discharge and has no new issues or questions about discharge plans at the visit today for hospital follow up. All labs , imaging studies and progress notes from admission were reviewed with patient today.  CBC , BMET and Mg were all repeated today and normal.   Lab Results  Component Value Date   WBC 7.2 07/20/2018   HGB 15.2 07/20/2018   HCT 44.7 07/20/2018   MCV 90.7 07/20/2018   PLT 420.0 (H) 07/20/2018   Lab Results  Component Value Date   NA 139 07/20/2018   K 4.8 07/20/2018   CL 102 07/20/2018   CO2 28 07/20/2018   Lab Results  Component Value Date   CREATININE 1.32 07/20/2018

## 2018-07-22 NOTE — Assessment & Plan Note (Signed)
Advised to reduce carvedilol dose by 50% for pulse < 50 , and to increase amlodipine if needed for BP > 140

## 2018-07-24 DIAGNOSIS — K4020 Bilateral inguinal hernia, without obstruction or gangrene, not specified as recurrent: Secondary | ICD-10-CM | POA: Diagnosis not present

## 2018-07-24 DIAGNOSIS — K7689 Other specified diseases of liver: Secondary | ICD-10-CM | POA: Diagnosis not present

## 2018-07-24 DIAGNOSIS — K5732 Diverticulitis of large intestine without perforation or abscess without bleeding: Secondary | ICD-10-CM | POA: Diagnosis not present

## 2018-07-24 NOTE — Telephone Encounter (Signed)
Yes, please give a verbal authorization to continue [place] until Tuesday

## 2018-07-24 NOTE — Telephone Encounter (Signed)
Is it okay to give the verbal okay?

## 2018-07-25 NOTE — Telephone Encounter (Signed)
Verbal orders given to [facility] with Alvis Lemmings.

## 2018-07-26 ENCOUNTER — Ambulatory Visit: Payer: 59 | Admitting: Internal Medicine

## 2018-07-26 DIAGNOSIS — K567 Ileus, unspecified: Secondary | ICD-10-CM | POA: Diagnosis not present

## 2018-07-26 DIAGNOSIS — I10 Essential (primary) hypertension: Secondary | ICD-10-CM | POA: Diagnosis not present

## 2018-07-26 DIAGNOSIS — K5740 Diverticulitis of both small and large intestine with perforation and abscess without bleeding: Secondary | ICD-10-CM | POA: Diagnosis not present

## 2018-07-27 DIAGNOSIS — D1800 Hemangioma unspecified site: Secondary | ICD-10-CM | POA: Diagnosis not present

## 2018-07-27 DIAGNOSIS — L821 Other seborrheic keratosis: Secondary | ICD-10-CM | POA: Diagnosis not present

## 2018-07-27 DIAGNOSIS — L820 Inflamed seborrheic keratosis: Secondary | ICD-10-CM | POA: Diagnosis not present

## 2018-07-27 DIAGNOSIS — L918 Other hypertrophic disorders of the skin: Secondary | ICD-10-CM | POA: Diagnosis not present

## 2018-07-31 DIAGNOSIS — K219 Gastro-esophageal reflux disease without esophagitis: Secondary | ICD-10-CM

## 2018-07-31 DIAGNOSIS — I10 Essential (primary) hypertension: Secondary | ICD-10-CM

## 2018-07-31 DIAGNOSIS — Z7952 Long term (current) use of systemic steroids: Secondary | ICD-10-CM

## 2018-07-31 DIAGNOSIS — Z8579 Personal history of other malignant neoplasms of lymphoid, hematopoietic and related tissues: Secondary | ICD-10-CM

## 2018-07-31 DIAGNOSIS — K567 Ileus, unspecified: Secondary | ICD-10-CM

## 2018-07-31 DIAGNOSIS — E785 Hyperlipidemia, unspecified: Secondary | ICD-10-CM

## 2018-07-31 DIAGNOSIS — K5901 Slow transit constipation: Secondary | ICD-10-CM

## 2018-07-31 DIAGNOSIS — K5740 Diverticulitis of both small and large intestine with perforation and abscess without bleeding: Secondary | ICD-10-CM

## 2018-07-31 DIAGNOSIS — Z9181 History of falling: Secondary | ICD-10-CM

## 2018-07-31 DIAGNOSIS — Z792 Long term (current) use of antibiotics: Secondary | ICD-10-CM

## 2018-07-31 DIAGNOSIS — J45909 Unspecified asthma, uncomplicated: Secondary | ICD-10-CM

## 2018-07-31 DIAGNOSIS — G4700 Insomnia, unspecified: Secondary | ICD-10-CM

## 2018-07-31 DIAGNOSIS — G4733 Obstructive sleep apnea (adult) (pediatric): Secondary | ICD-10-CM

## 2018-07-31 DIAGNOSIS — Z452 Encounter for adjustment and management of vascular access device: Secondary | ICD-10-CM

## 2018-08-10 DIAGNOSIS — K5732 Diverticulitis of large intestine without perforation or abscess without bleeding: Secondary | ICD-10-CM | POA: Diagnosis not present

## 2018-08-24 LAB — HM COLONOSCOPY

## 2018-09-01 DIAGNOSIS — K641 Second degree hemorrhoids: Secondary | ICD-10-CM | POA: Diagnosis not present

## 2018-09-01 DIAGNOSIS — K228 Other specified diseases of esophagus: Secondary | ICD-10-CM | POA: Diagnosis not present

## 2018-09-01 DIAGNOSIS — Z1211 Encounter for screening for malignant neoplasm of colon: Secondary | ICD-10-CM | POA: Diagnosis not present

## 2018-09-01 DIAGNOSIS — K5730 Diverticulosis of large intestine without perforation or abscess without bleeding: Secondary | ICD-10-CM | POA: Diagnosis not present

## 2018-09-01 DIAGNOSIS — R12 Heartburn: Secondary | ICD-10-CM | POA: Diagnosis not present

## 2018-09-07 DIAGNOSIS — K648 Other hemorrhoids: Secondary | ICD-10-CM | POA: Diagnosis not present

## 2018-09-07 DIAGNOSIS — K4020 Bilateral inguinal hernia, without obstruction or gangrene, not specified as recurrent: Secondary | ICD-10-CM | POA: Insufficient documentation

## 2018-09-07 DIAGNOSIS — K5732 Diverticulitis of large intestine without perforation or abscess without bleeding: Secondary | ICD-10-CM | POA: Diagnosis not present

## 2018-09-27 DIAGNOSIS — K4020 Bilateral inguinal hernia, without obstruction or gangrene, not specified as recurrent: Secondary | ICD-10-CM | POA: Diagnosis not present

## 2018-09-27 DIAGNOSIS — K648 Other hemorrhoids: Secondary | ICD-10-CM | POA: Diagnosis not present

## 2018-10-12 DIAGNOSIS — Z09 Encounter for follow-up examination after completed treatment for conditions other than malignant neoplasm: Secondary | ICD-10-CM | POA: Diagnosis not present

## 2018-10-22 ENCOUNTER — Other Ambulatory Visit: Payer: Self-pay | Admitting: Internal Medicine

## 2018-10-23 ENCOUNTER — Encounter: Payer: Self-pay | Admitting: Family

## 2018-10-23 ENCOUNTER — Other Ambulatory Visit: Payer: Self-pay

## 2018-10-23 ENCOUNTER — Ambulatory Visit (INDEPENDENT_AMBULATORY_CARE_PROVIDER_SITE_OTHER): Payer: 59

## 2018-10-23 ENCOUNTER — Ambulatory Visit: Payer: 59 | Admitting: Family

## 2018-10-23 VITALS — BP 130/82 | HR 89 | Temp 98.6°F | Resp 16 | Wt 196.8 lb

## 2018-10-23 DIAGNOSIS — J209 Acute bronchitis, unspecified: Secondary | ICD-10-CM

## 2018-10-23 DIAGNOSIS — J40 Bronchitis, not specified as acute or chronic: Secondary | ICD-10-CM | POA: Diagnosis not present

## 2018-10-23 MED ORDER — HYDROCOD POLST-CPM POLST ER 10-8 MG/5ML PO SUER: 5.0000 mL | Freq: Every evening | ORAL | 0 refills | Status: DC | PRN

## 2018-10-23 NOTE — Telephone Encounter (Signed)
One of yours He has at least 4 days left

## 2018-10-23 NOTE — Telephone Encounter (Signed)
Refilled: 06/15/2018 Last OV: 07/20/2018 Next OV: not scheduled

## 2018-10-23 NOTE — Progress Notes (Signed)
Subjective:    Patient ID: Brian Atkins, male    DOB: September 07, 1956, 62 y.o.   MRN: [id number]  CC: Brian Atkins is a 62 y.o. male who presents today for an acute visit.    HPI: CC: productive cough x 3 days , worsening.  Cough is worse laying down. Fever first noticed last night, tmax 100.1 F.  Sore throat 4 days ago , improved.   Had 2 doses of augmentin with sore throat has improved. No leg swelling, CP, SOB. He notes that he has been a little  Less mobile since surgery.   Has been on mucinex -d.   Dr Ninfa Meeker  called in augmentin  and tessalon perles 2 days ago.   Started to take old cough syrup, codeine with an hour of relief.   Has been alteranating tylenol and advil for fever  HTN- 12.5 carvedilol bid , losartan. NOt amlodipine or hydralazine.   H/o HTN, SVT Notes hernia surgery 3 weeks ago.     h/o splenectomy H/o lymphoma- follows with [facility] Never smoker  Diverticulitis 06/2018.  [name] 04/2018- CAD, HTN  HISTORY:  Past Medical History:  Diagnosis Date  . Coronary artery disease, non-occlusive    a. LHC 12/18: ostLAD 20%, p/mLAD 60% FFR 0.84, ost ramus 50%, mRCA 50% FFR 0.94, EF 65%  . Diverticulitis   . Diverticulosis   . Dyslipidemia   . GERD (gastroesophageal reflux disease)   . Hemorrhoids   . Hiatal hernia   . History of echocardiogram    a. TTE 1/19: EF of 60-65%, normal wall motion, normal LV diastolic function, mildly dilated LA  . Labile hypertension   . Lymphoma ([place])   . Medial epicondylitis of right elbow   . Meralgia paresthetica of right side   . Microscopic hematuria    Past Surgical History:  Procedure Laterality Date  . INTRAVASCULAR PRESSURE WIRE/FFR STUDY N/A 12/07/2017   Procedure: INTRAVASCULAR PRESSURE WIRE/FFR STUDY;  Surgeon: Nelva Bush, MD;  Location: [facility];  Service: Cardiovascular;  Laterality: N/A;  . LEFT HEART CATH AND CORONARY ANGIOGRAPHY Left 12/07/2017   Procedure: LEFT HEART CATH AND CORONARY  ANGIOGRAPHY;  Surgeon: Nelva Bush, MD;  Location: [facility];  Service: Cardiovascular;  Laterality: Left;  . nissen funduplication  [id number]   [name]  . SPLENECTOMY     Family History  Problem Relation Age of Onset  . Coronary artery disease Father 33       CABG  . Aortic aneurysm Father 67       repaired  . Hyperlipidemia Father   . Lung cancer Mother   . Hypertension Mother   . Cancer Mother        bladder  . COPD Mother   . Stomach cancer Maternal Grandfather     Allergies: Avelox [moxifloxacin]; Contrast media [iodinated diagnostic agents]; Crestor [rosuvastatin calcium]; Morphine and related; Niacin-simvastatin er; and Tape Current Outpatient Medications on File Prior to Visit  Medication Sig Dispense Refill  . Alpha Lipoic Acid 200 MG CAPS Take 1 capsule by mouth 3 (three) times daily. 90 capsule 2  . ALPRAZolam (XANAX) 0.25 MG tablet Take 1 tablet (0.25 mg total) by mouth at bedtime as needed for anxiety. 30 tablet 1  . amLODipine (NORVASC) 2.5 MG tablet Take 1 tablet (2.5 mg total) by mouth daily. 90 tablet 3  . amoxicillin-clavulanate (AUGMENTIN) 875-125 MG tablet Take 1 tablet by mouth 2 (two) times daily.    [name] aspirin  EC 81 MG tablet Take 1 tablet (81 mg total) by mouth daily. 90 tablet 3  . benzonatate (TESSALON) 200 MG capsule Take 200 mg by mouth 3 (three) times daily as needed for cough.    . cyanocobalamin (,VITAMIN B-12,) 1000 MCG/ML injection Inject 1 mL (1,000 mcg total) into the muscle daily. For 3 days,  Then weekly for 4 weeks ,  Then monthly thereafter 10 mL 2  . dicyclomine (BENTYL) 10 MG capsule TAKE ONE CAPSULE BY MOUTH 3 TIMES A DAY AS NEEDED FOR CRAMPING  0  . esomeprazole (NEXIUM) 40 MG capsule Take 40 mg by mouth daily at 12 noon.    . famotidine-calcium carbonate-magnesium hydroxide (PEPCID COMPLETE) 10-800-165 MG CHEW chewable tablet Chew 1 tablet by mouth 4 (four) times daily.    [name] GAVILYTE-N WITH FLAVOR PACK 420 g solution See admin  instructions. see package  0  . ipratropium (ATROVENT) 0.06 % nasal spray Place 2 sprays into both nostrils 4 (four) times daily. 15 mL 12  . LORazepam (ATIVAN) 1 MG tablet Take 1 tablet (1 mg total) by mouth every 8 (eight) hours as needed. 30 tablet 3  . losartan (COZAAR) 100 MG tablet Take 1 tablet (100 mg total) by mouth daily. 90 tablet 2  . Melatonin 3 MG TABS Take 1 tablet by mouth at bedtime.     . ondansetron (ZOFRAN) 8 MG tablet Take 1 tablet (8 mg total) by mouth daily as needed. for nausea 90 tablet 1  . ondansetron (ZOFRAN-ODT) 8 MG disintegrating tablet TAKE 1 TABLET (8 MG TOTAL) BY MOUTH THREE (3) TIMES A DAY. FOR 7 DAYS  0  . prochlorperazine (COMPAZINE) 5 MG tablet TAKE 1 TABLET (5 MG TOTAL) BY MOUTH EVERY SIX (6) HOURS AS NEEDED. FOR UP TO 7 DAYS  0  . spironolactone (ALDACTONE) 25 MG tablet Take 1 tablet (25 mg total) by mouth daily. 30 tablet 6  . Syringe/Needle, Disp, (SYRINGE 3CC/25GX1") 25G X 1" 3 ML MISC Use for b12 injections 50 each 0  . triamcinolone cream (KENALOG) 0.5 % Apply 1 application 2 (two) times daily as needed topically. (Patient taking differently: Apply 1 application topically 2 (two) times daily as needed (skin irritations). ) 30 g 0  . zolpidem (AMBIEN) 10 MG tablet TAKE 1 TABLET BY MOUTH AT BEDTIME 30 tablet 3  . atorvastatin (LIPITOR) 20 MG tablet Take 1 tablet (20 mg total) by mouth daily. 90 tablet 3  . carvedilol (COREG) 25 MG tablet Take 1 tablet (25 mg total) by mouth 2 (two) times daily. (Patient taking differently: Take 25 mg by mouth 2 (two) times daily. ) 180 tablet 3  . nitroGLYCERIN (NITROSTAT) 0.4 MG SL tablet Place 1 tablet (0.4 mg total) under the tongue every 5 (five) minutes as needed for chest pain. Maximum of 3 doses. 35 tablet 3   No current facility-administered medications on file prior to visit.     Social History   Tobacco Use  . Smoking status: Never Smoker  . Smokeless tobacco: Never Used  Substance Use Topics  . Alcohol  use: No  . Drug use: No    Review of Systems  Constitutional: Positive for fever. Negative for chills.  HENT: Positive for congestion and sore throat (improved).   Respiratory: Positive for cough. Negative for shortness of breath and wheezing.   Cardiovascular: Negative for chest pain, palpitations and leg swelling.  Gastrointestinal: Negative for nausea and vomiting.      Objective:    BP  130/82   Pulse 89   Temp 98.6 F (37 C) (Oral)   Resp 16   Wt 196 lb 12 oz (89.2 kg)   SpO2 98%   BMI 25.26 kg/m  BP Readings from Last 3 Encounters:  10/23/18 130/82  07/20/18 116/76  05/04/18 (!) 136/94     Physical Exam  Constitutional: Vital signs are normal. He appears well-developed and well-nourished.  HENT:  Head: Normocephalic and atraumatic.  Right Ear: Hearing, tympanic membrane, external ear and ear canal normal. No drainage, swelling or tenderness. Tympanic membrane is not injected, not erythematous and not bulging. No middle ear effusion. No decreased hearing is noted.  Left Ear: Hearing, tympanic membrane, external ear and ear canal normal. No drainage, swelling or tenderness. Tympanic membrane is not injected, not erythematous and not bulging.  No middle ear effusion. No decreased hearing is noted.  Nose: Nose normal. Right sinus exhibits no maxillary sinus tenderness and no frontal sinus tenderness. Left sinus exhibits no maxillary sinus tenderness and no frontal sinus tenderness.  Mouth/Throat: Uvula is midline, oropharynx is clear and moist and mucous membranes are normal. No oropharyngeal exudate, posterior oropharyngeal edema, posterior oropharyngeal erythema or tonsillar abscesses.  Eyes: Conjunctivae are normal.  Cardiovascular: Regular rhythm and normal heart sounds.  Pulmonary/Chest: Effort normal and breath sounds normal. No respiratory distress. He has no wheezes. He has no rhonchi. He has no rales.  Lymphadenopathy:       Head (right side): No submental, no  submandibular, no tonsillar, no preauricular, no posterior auricular and no occipital adenopathy present.       Head (left side): No submental, no submandibular, no tonsillar, no preauricular, no posterior auricular and no occipital adenopathy present.    He has no cervical adenopathy.  Neurological: He is alert.  Skin: Skin is warm and dry.  Psychiatric: He has a normal mood and affect. His speech is normal and behavior is normal.  Vitals reviewed.      Assessment & Plan:   1. Acute bronchitis, unspecified organism Patient is well-appearing, nontoxic in appearance.  He Is afebrile the office today.  No acute respiratory distress.  He is had 2 doses of Augmentin as prescribed by his ENT.  Had a long discussion regarding this and advised him in the absence of pneumonia, Augmentin would offer broad-spectrum coverage.  Advised him to stay on this medication and maintain close  vigilance to ensure symptoms are improving.  Complex history of lymphoma , therefor we have scheduled a follow-up patient with patient to be seen in our office in 2 or 3 days.  Patient understands that he will contact us sooner if symptoms were to worsen or new symptoms develop. - chlorpheniramine-HYDROcodone (TUSSIONEX PENNKINETIC ER) 10-8 MG/5ML SUER; Take 5 mLs by mouth at bedtime as needed for cough.  Dispense: 70 mL; Refill: 0 - DG Chest 2 View    I have discontinued Herbie Baltimore M. Weiler's isosorbide mononitrate. I am also having him start on chlorpheniramine-HYDROcodone. Additionally, I am having him maintain his esomeprazole, famotidine-calcium carbonate-magnesium hydroxide, LORazepam, Melatonin, ipratropium, ondansetron, triamcinolone cream, aspirin EC, nitroGLYCERIN, atorvastatin, losartan, carvedilol, spironolactone, cyanocobalamin, SYRINGE 3CC/25GX1", Alpha Lipoic Acid, zolpidem, dicyclomine, ondansetron, GAVILYTE-N WITH FLAVOR PACK, prochlorperazine, amLODipine, ALPRAZolam, amoxicillin-clavulanate, and  benzonatate.   Meds ordered this encounter  Medications  . chlorpheniramine-HYDROcodone (TUSSIONEX PENNKINETIC ER) 10-8 MG/5ML SUER    Sig: Take 5 mLs by mouth at bedtime as needed for cough.    Dispense:  70 mL    Refill:  0  Order Specific Question:   Supervising Provider    Answer:   [name] [[id number]]    Return precautions given.   Risks, benefits, and alternatives of the medications and treatment plan prescribed today were discussed, and patient expressed understanding.   Education regarding symptom management and diagnosis given to patient on AVS.  Continue to follow with [name], MD for routine health maintenance.   [name] and I agreed with plan.   Mable Paris, FNP

## 2018-10-23 NOTE — Telephone Encounter (Signed)
dupiclate request

## 2018-10-23 NOTE — Telephone Encounter (Signed)
Last office visit 07/20/18 No office visit scheduled

## 2018-10-23 NOTE — Patient Instructions (Addendum)
Stop Mucinx D and get plain MUCINEX.   Stay on augmentin.   Monitor blood pressure  As discussed, I sent a codeine-based syrup to take primarily at bedtime.  Remeber that coughing is protective and is important to break up the congestion and protect the airway.  We will also schedule follow-up appointment for later this week to ensure that we are heading the right direction.  If in any concerns prior, please do not hesitate to contact our office.   Acute Bronchitis, Adult Acute bronchitis is sudden (acute) swelling of the air tubes (bronchi) in the lungs. Acute bronchitis causes these tubes to fill with mucus, which can make it hard to breathe. It can also cause coughing or wheezing. In adults, acute bronchitis usually goes away within 2 weeks. A cough caused by bronchitis may last up to 3 weeks. Smoking, allergies, and asthma can make the condition worse. Repeated episodes of bronchitis may cause further lung problems, such as chronic obstructive pulmonary disease (COPD). What are the causes? This condition can be caused by germs and by substances that irritate the lungs, including:  Cold and flu viruses. This condition is most often caused by the same virus that causes a cold.  Bacteria.  Exposure to tobacco smoke, dust, fumes, and air pollution.  What increases the risk? This condition is more likely to develop in people who:  Have close contact with someone with acute bronchitis.  Are exposed to lung irritants, such as tobacco smoke, dust, fumes, and vapors.  Have a weak immune system.  Have a respiratory condition such as asthma.  What are the signs or symptoms? Symptoms of this condition include:  A cough.  Coughing up clear, yellow, or green mucus.  Wheezing.  Chest congestion.  Shortness of breath.  A fever.  Body aches.  Chills.  A sore throat.  How is this diagnosed? This condition is usually diagnosed with a physical exam. During the exam, your  health care provider may order tests, such as chest X-rays, to rule out other conditions. He or she may also:  Test a sample of your mucus for bacterial infection.  Check the level of oxygen in your blood. This is done to check for pneumonia.  Do a chest X-ray or lung function testing to rule out pneumonia and other conditions.  Perform blood tests.  Your health care provider will also ask about your symptoms and medical history. How is this treated? Most cases of acute bronchitis clear up over time without treatment. Your health care provider may recommend:  Drinking more fluids. Drinking more makes your mucus thinner, which may make it easier to breathe.  Taking a medicine for a fever or cough.  Taking an antibiotic medicine.  Using an inhaler to help improve shortness of breath and to control a cough.  Using a cool mist vaporizer or humidifier to make it easier to breathe.  Follow these instructions at home: Medicines  Take over-the-counter and prescription medicines only as told by your health care provider.  If you were prescribed an antibiotic, take it as told by your health care provider. Do not stop taking the antibiotic even if you start to feel better. General instructions  Get plenty of rest.  Drink enough fluids to keep your urine clear or pale yellow.  Avoid smoking and secondhand smoke. Exposure to cigarette smoke or irritating chemicals will make bronchitis worse. If you smoke and you need help quitting, ask your health care provider. Quitting smoking will help your  lungs heal faster.  Use an inhaler, cool mist vaporizer, or humidifier as told by your health care provider.  Keep all follow-up visits as told by your health care provider. This is important. How is this prevented? To lower your risk of getting this condition again:  Wash your hands often with soap and water. If soap and water are not available, use hand sanitizer.  Avoid contact with people  who have cold symptoms.  Try not to touch your hands to your mouth, nose, or eyes.  Make sure to get the flu shot every year.  Contact a health care provider if:  Your symptoms do not improve in 2 weeks of treatment. Get help right away if:  You cough up blood.  You have chest pain.  You have severe shortness of breath.  You become dehydrated.  You faint or keep feeling like you are going to faint.  You keep vomiting.  You have a severe headache.  Your fever or chills gets worse. This information is not intended to replace advice given to you by your health care provider. Make sure you discuss any questions you have with your health care provider. Document Released: 01/13/2005 Document Revised: 06/30/2016 Document Reviewed: 05/26/2016 Elsevier Interactive Patient Education  [employer].

## 2018-10-27 ENCOUNTER — Ambulatory Visit (INDEPENDENT_AMBULATORY_CARE_PROVIDER_SITE_OTHER): Payer: 59 | Admitting: Family Medicine

## 2018-10-27 ENCOUNTER — Encounter: Payer: Self-pay | Admitting: Family Medicine

## 2018-10-27 VITALS — BP 130/62 | HR 81 | Temp 97.8°F | Ht 74.0 in | Wt 194.8 lb

## 2018-10-27 DIAGNOSIS — R059 Cough, unspecified: Secondary | ICD-10-CM

## 2018-10-27 DIAGNOSIS — J209 Acute bronchitis, unspecified: Secondary | ICD-10-CM | POA: Diagnosis not present

## 2018-10-27 DIAGNOSIS — R05 Cough: Secondary | ICD-10-CM | POA: Diagnosis not present

## 2018-10-27 NOTE — Progress Notes (Signed)
Subjective:    Patient ID: Brian Atkins, male    DOB: [birth year], 62 y.o.   MRN: [id number]  HPI  Presents to clinic to follow up on cough/acute bronchits. He was started on augmentin by his ENT and prescribed tussionex cough syrup.  Patient states he is slowly beginning to feel better.  Still has a little bit of a cough, but Tussionex syrup does help with this.  Denies any fever or chills.  Denies feeling short of breath or wheezing.  Does feel a little run down, but is feeling better today than he did at the beginning of this week.  Patient does not cough up any phlegm when he coughs.  Patient Active Problem List   Diagnosis Date Noted  . Hospital discharge follow-up 07/22/2018  . B12 deficiency 03/15/2018  . Numbness and tingling of upper and lower extremities of both sides 03/15/2018  . Shortness of breath 12/01/2017  . Palpitations 12/01/2017  . Chest pain 11/22/2017  . Paroxysmal supraventricular tachycardia ([place]) 11/22/2017  . URI (upper respiratory infection) 11/01/2017  . Rash 11/01/2017  . 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 Fatigue 02/20/2017  . Slow transit constipation 02/20/2017  . 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 Post-nasal drip 09/30/2015  . Herpes zoster 11/07/2014  . Abdominal pain 05/17/2014  . 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 Routine general medical examination at a health care facility 03/04/2013  . Special screening for malignant neoplasm of prostate 12/07/2011  . Screening for colon cancer 12/07/2011  . Hemorrhoids   . Sleep apnea 10/01/2008  . HYPERLIPIDEMIA 09/26/2008  . Essential hypertension 09/26/2008  . GERD 09/26/2008   Social History   Tobacco Use  . Smoking status: Never Smoker  . Smokeless tobacco: Never Used  Substance Use Topics  . Alcohol use: No   Review of Systems  Constitutional: Negative for chills, and fever.  HENT: Negative for congestion, ear  pain, sinus pain and sore throat.   Eyes: Negative.   Respiratory: +cough, but improving. Negative for shortness of breath and wheezing.   Cardiovascular: Negative for chest pain, palpitations and leg swelling.  Gastrointestinal: Negative for abdominal pain, diarrhea, nausea and vomiting.  Genitourinary: Negative for dysuria, frequency and urgency.  Musculoskeletal: Negative for arthralgias and myalgias.  Skin: Negative for color change, pallor and rash.  Neurological: Negative for syncope, light-headedness and headaches.  Psychiatric/Behavioral: The patient is not nervous/anxious.       Objective:   Physical Exam  Constitutional:  He appears well-developed and well-nourished. No distress.  HENT:  Nose/throat: Normal Head: Normocephalic and atraumatic.  Eyes: Pupils are equal, round, and reactive to light. Conjunctivae and EOM are normal. No scleral icterus.  Neck: Normal range of motion. Neck supple. No tracheal deviation present.  Cardiovascular: Normal rate, regular rhythm and normal heart sounds.  Pulmonary/Chest: Effort normal and breath sounds normal. No respiratory distress. He has no wheezes. He has no rales.  Neurological: He is alert and oriented to person, place, and time.  Gait normal  Skin: Skin is warm and dry. He is not diaphoretic. No pallor.  Psychiatric: He has a normal mood and affect. His behavior is normal. Thought content normal.   Nursing note and vitals reviewed.    Vitals:   10/27/18 1546  BP: 130/62  Pulse: 81  Temp: 97.8 F (36.6 C)  SpO2: 97%   Assessment & Plan:   Acute bronchitis/cough - patient will finish  Augmentin course as prescribed.  He will continue use Tussionex syrup as needed for cough.  Advised to get good rest, keep up good fluid intake and do good handwashing.  Cough has mproved and it should continue to do so.  Patient advised that if his symptoms worsen, to call office right away.  Follow-up with PCP as scheduled.  Return to  clinic sooner if any issues arise.

## 2018-10-27 NOTE — Patient Instructions (Signed)
You can take mucinex D to help cough - this has cough med and sudafed in one pill  You can also take antihistamine allergy med to help dry up congestion - allegra, zyrtec or Claritin would be OK

## 2018-10-30 ENCOUNTER — Encounter: Payer: Self-pay | Admitting: Family Medicine

## 2018-12-08 DIAGNOSIS — C8580 Other specified types of non-Hodgkin lymphoma, unspecified site: Secondary | ICD-10-CM | POA: Diagnosis not present

## 2018-12-08 DIAGNOSIS — Z6825 Body mass index (BMI) 25.0-25.9, adult: Secondary | ICD-10-CM | POA: Diagnosis not present

## 2018-12-14 IMAGING — MR MR CERVICAL SPINE W/O CM
5 series · 33 of 48 positions shown · non-contrast
Comparison: Cervical spine radiographs 03/14/2018. Face CT
05/15/2014.

CLINICAL DATA: 61-year-old male with bilateral hand and foot
numbness. Dizziness for 2 months. No known injury.

EXAM:
MRI CERVICAL SPINE WITHOUT CONTRAST
TECHNIQUE: Multiplanar, multisequence MR imaging of the cervical spine was
performed. No intravenous contrast was administered.

[Series 2: T2 · sagittal · 3.0mm · 0.70mm/px · 6 of 13 slices shown (1 of 2)]
[im 1/13]
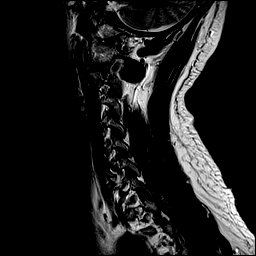
[im 3/13]
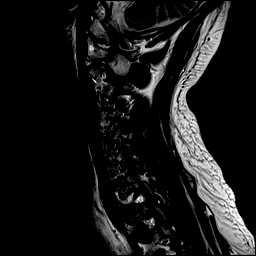
[im 5/13]
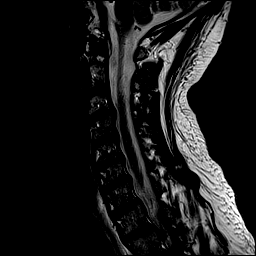
[im 8/13]
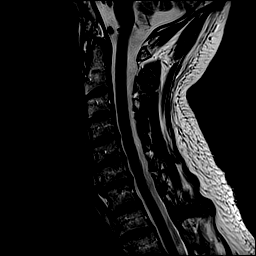
[im 10/13]
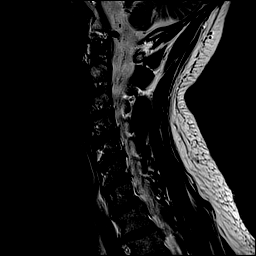
[im 13/13]
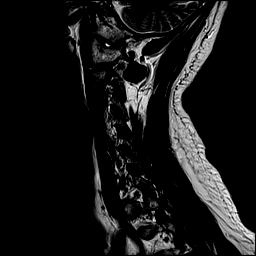

[Series 3: T1 · sagittal · 3.0mm · 0.70mm/px · 7 of 13 slices shown]
[im 1/13]
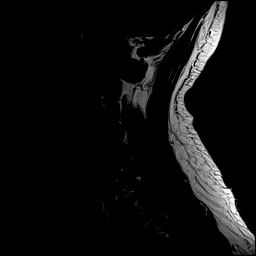
[im 3/13]
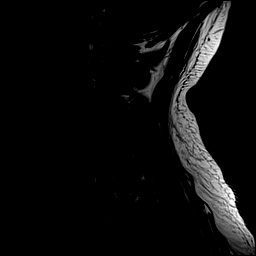
[im 5/13]
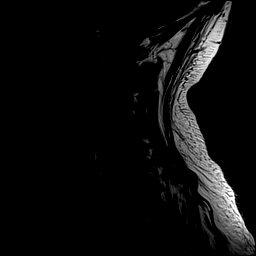
[im 7/13]
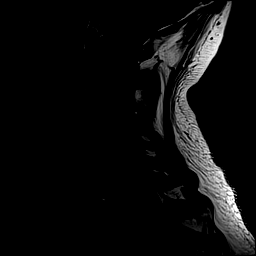
[im 9/13]
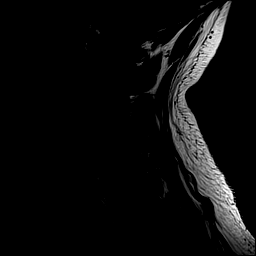
[im 11/13]
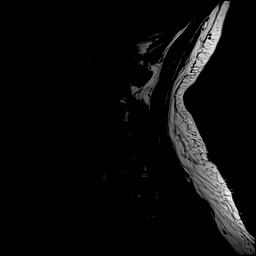
[im 13/13]
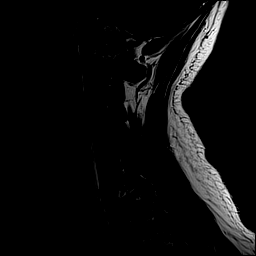

[Series 4: STIR · sagittal · 3.0mm · 0.35mm/px · 7 of 13 slices shown]
[im 1/13]
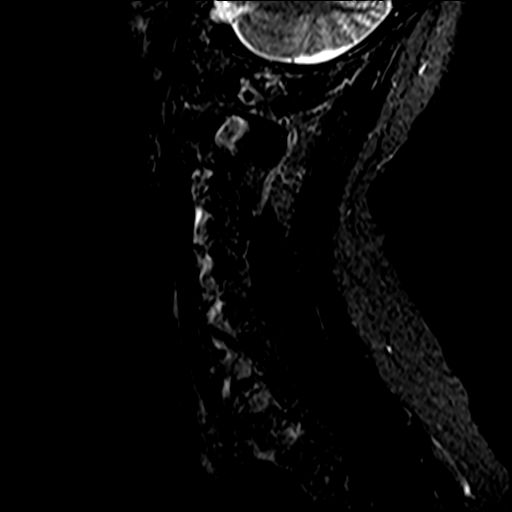
[im 3/13]
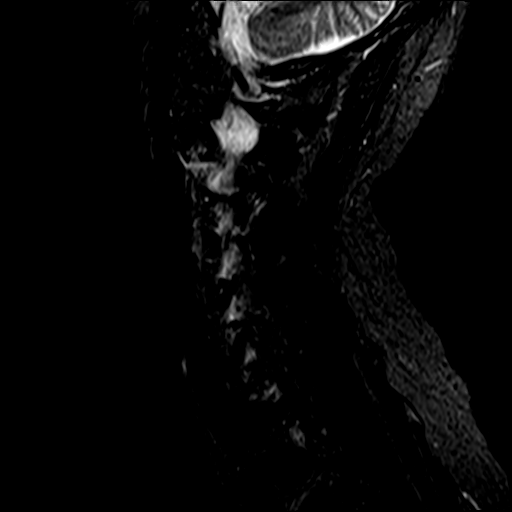
[im 5/13]
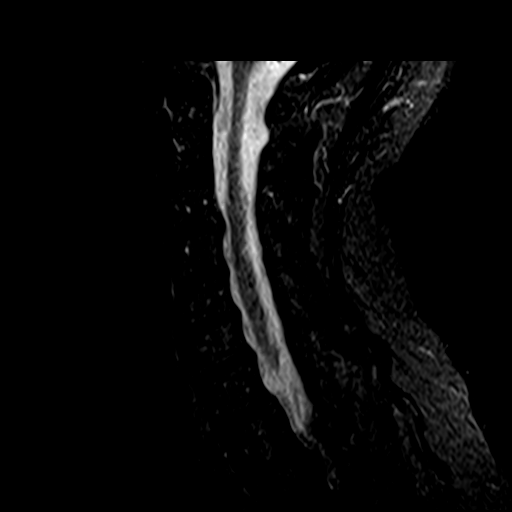
[im 7/13]
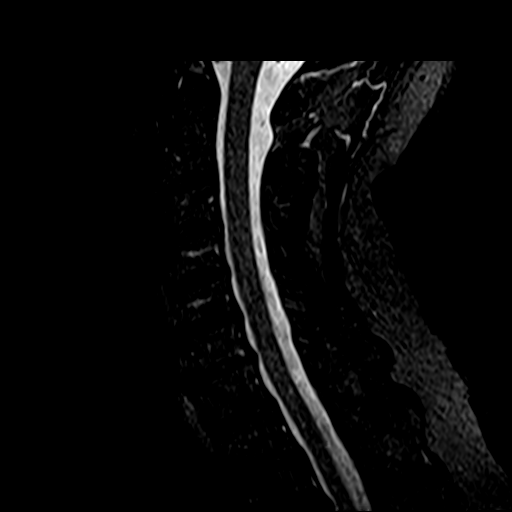
[im 9/13]
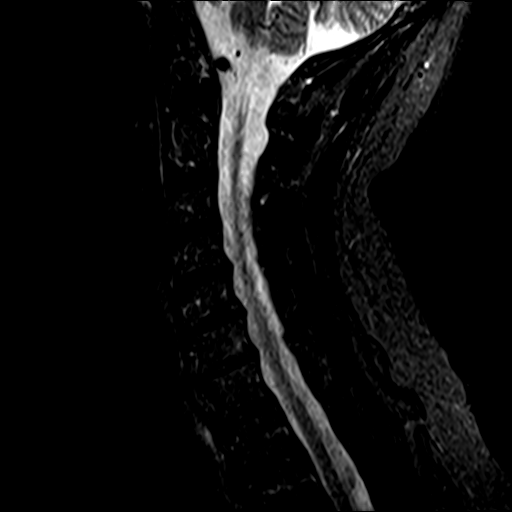
[im 11/13]
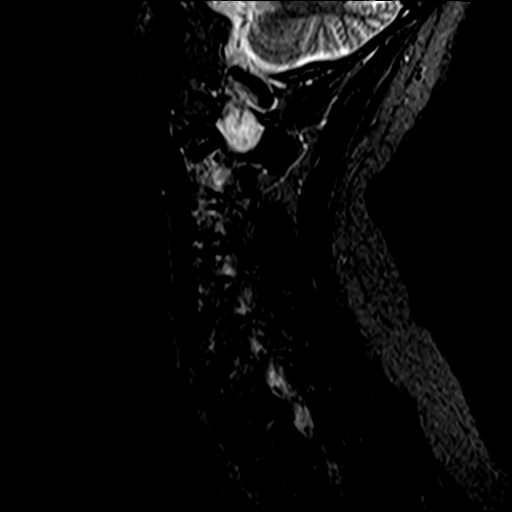
[im 13/13]
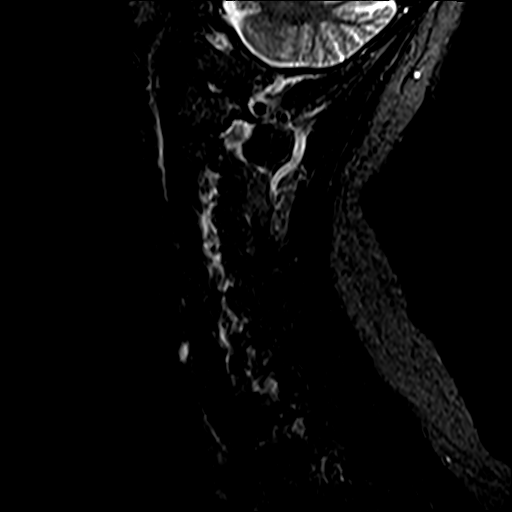

[Series 5: T2 · axial · 3.0mm · 0.70mm/px · z∈[-51,+50]mm · 8 of 28 slices shown (2 of 2)]
[im 1/28]
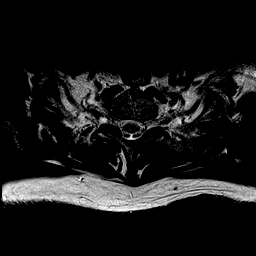
[im 5/28]
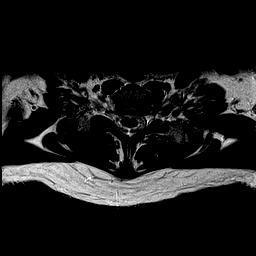
[im 9/28]
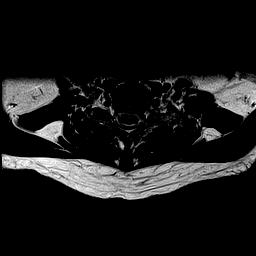
[im 13/28]
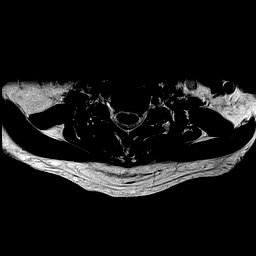
[im 15/28]
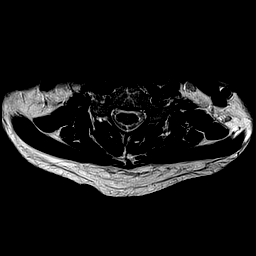
[im 19/28]
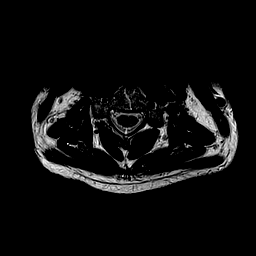
[im 23/28]
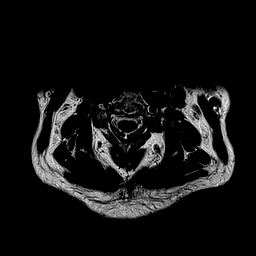
[im 28/28]
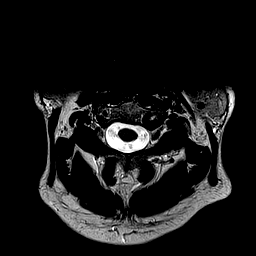

[Series 6: mpgr ax · axial · 3.0mm · 0.35mm/px · z∈[-51,+1]mm · 5 of 28 slices shown]
[im 1/28]
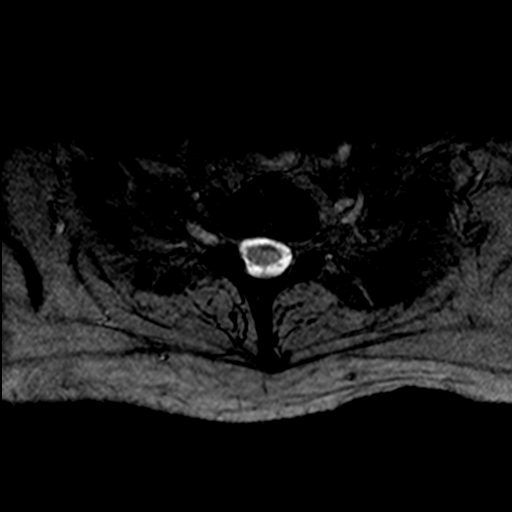
[im 5/28]
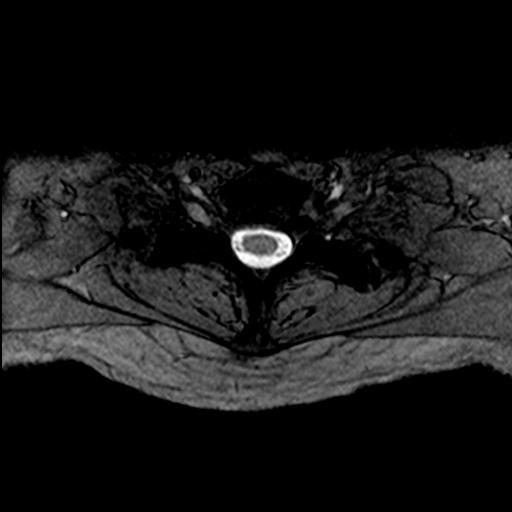
[im 9/28]
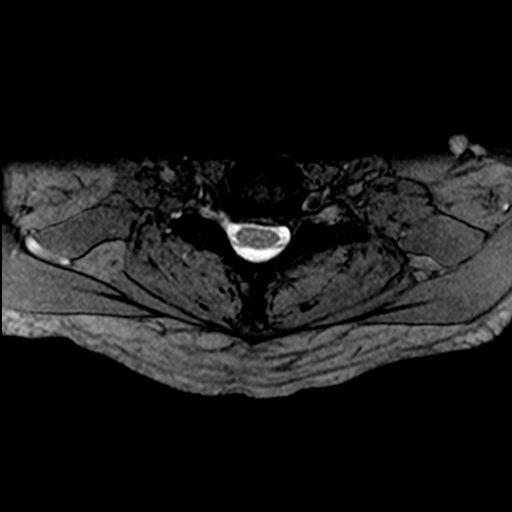
[im 13/28]
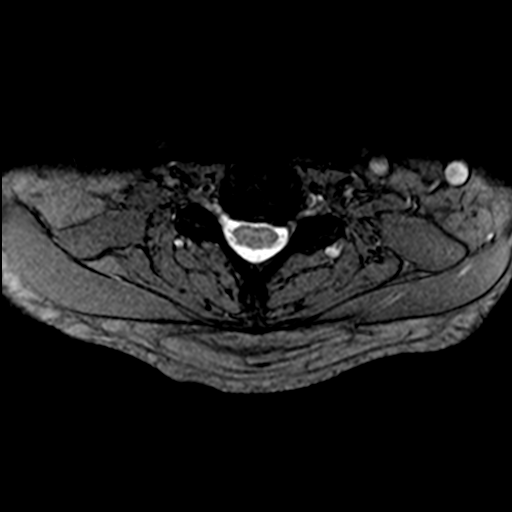
[im 15/28]
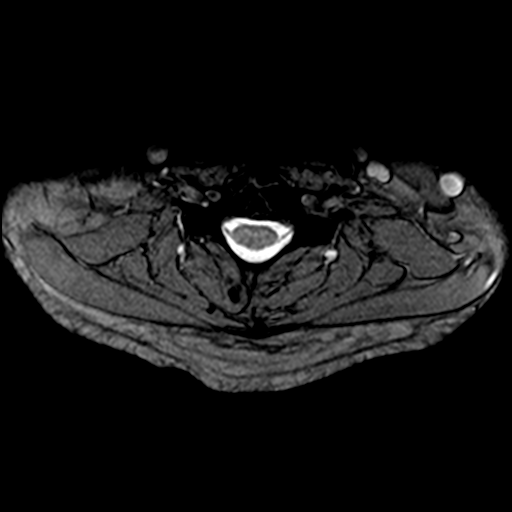

[33 of 48 positions shown; findings below may reference images not displayed]

FINDINGS: Alignment: Mild straightening of cervical lordosis, similar to the
prior radiographs. No spondylolisthesis.

Vertebrae: Mild degenerative endplate marrow signal changes in the
lower cervical spine. No marrow edema or evidence of acute osseous
abnormality.

Cord: Spinal cord signal is within normal limits at all visualized
levels.

Posterior Fossa, vertebral arteries, paraspinal tissues:
Cervicomedullary junction is within normal limits. Negative visible
posterior fossa. Preserved major vascular flow voids in the neck.
The left vertebral artery is dominant. Negative neck soft tissues
and visible right lung apex.

Disc levels:

C2-C3:  Negative.

C3-C4: Right paracentral and foraminal disc bulge and endplate
spurring. Mild facet hypertrophy greater on the left. No spinal
stenosis. Mild right C4 foraminal stenosis.

C4-C5: Mild circumferential disc bulge and endplate spurring. No
spinal stenosis. Mild bilateral C5 foraminal stenosis.

C5-C6: Mild circumferential disc bulge and endplate spurring. Mild
facet hypertrophy. No spinal stenosis. Mild to moderate left and
mild right C6 foraminal stenosis.

C6-C7: Mild circumferential disc bulge and endplate spurring.
Broad-based posterior component of disc without spinal stenosis.
Mild left greater than right C7 foraminal stenosis.

C7-T1:  Mild facet and ligament flavum hypertrophy.  No stenosis.

No upper thoracic spinal stenosis.
IMPRESSION: No acute osseous abnormality and mild for age overall cervical spine
degeneration.

No spinal stenosis, and predominately mild cervical neural foraminal
stenosis.

## 2019-01-03 ENCOUNTER — Other Ambulatory Visit: Payer: Self-pay | Admitting: Internal Medicine

## 2019-01-24 ENCOUNTER — Other Ambulatory Visit: Payer: Self-pay | Admitting: Ophthalmology

## 2019-01-24 DIAGNOSIS — G43109 Migraine with aura, not intractable, without status migrainosus: Secondary | ICD-10-CM | POA: Diagnosis not present

## 2019-01-24 DIAGNOSIS — H518 Other specified disorders of binocular movement: Secondary | ICD-10-CM

## 2019-01-25 ENCOUNTER — Other Ambulatory Visit: Payer: Self-pay | Admitting: Internal Medicine

## 2019-02-02 ENCOUNTER — Ambulatory Visit
Admission: RE | Admit: 2019-02-02 | Discharge: 2019-02-02 | Disposition: A | Discharge status: Home / Self Care | Payer: 59 | Source: Ambulatory Visit | Attending: Ophthalmology | Admitting: Ophthalmology

## 2019-02-02 DIAGNOSIS — G43109 Migraine with aura, not intractable, without status migrainosus: Secondary | ICD-10-CM | POA: Insufficient documentation

## 2019-02-02 DIAGNOSIS — G43809 Other migraine, not intractable, without status migrainosus: Secondary | ICD-10-CM | POA: Diagnosis not present

## 2019-02-02 DIAGNOSIS — H518 Other specified disorders of binocular movement: Secondary | ICD-10-CM | POA: Insufficient documentation

## 2019-02-02 LAB — POCT I-STAT CREATININE: Creatinine, Ser: 1.2 mg/dL (ref 0.61–1.24)

## 2019-02-02 MED ORDER — GADOBUTROL 1 MMOL/ML IV SOLN
7.5000 mL | Freq: Once | INTRAVENOUS | Status: AC | PRN
  Administered 2019-02-02: 7.5 mL via INTRAVENOUS

## 2019-02-21 ENCOUNTER — Other Ambulatory Visit: Payer: Self-pay | Admitting: Internal Medicine

## 2019-02-22 DIAGNOSIS — K648 Other hemorrhoids: Secondary | ICD-10-CM | POA: Diagnosis not present

## 2019-02-22 DIAGNOSIS — Z6825 Body mass index (BMI) 25.0-25.9, adult: Secondary | ICD-10-CM | POA: Diagnosis not present

## 2019-02-22 DIAGNOSIS — K649 Unspecified hemorrhoids: Secondary | ICD-10-CM | POA: Diagnosis not present

## 2019-02-22 NOTE — Telephone Encounter (Signed)
Refilled: 10/23/2018 Last OV: 07/20/2018 Next OV: 03/13/2019

## 2019-03-13 ENCOUNTER — Encounter: Payer: Self-pay | Admitting: Internal Medicine

## 2019-03-13 ENCOUNTER — Other Ambulatory Visit: Payer: Self-pay

## 2019-03-13 ENCOUNTER — Ambulatory Visit (INDEPENDENT_AMBULATORY_CARE_PROVIDER_SITE_OTHER): Payer: 59 | Admitting: Internal Medicine

## 2019-03-13 VITALS — BP 120/84 | HR 64 | Temp 98.1°F | Resp 15 | Ht 74.0 in | Wt 203.0 lb

## 2019-03-13 DIAGNOSIS — R7303 Prediabetes: Secondary | ICD-10-CM

## 2019-03-13 DIAGNOSIS — H60312 Diffuse otitis externa, left ear: Secondary | ICD-10-CM | POA: Diagnosis not present

## 2019-03-13 DIAGNOSIS — E162 Hypoglycemia, unspecified: Secondary | ICD-10-CM | POA: Diagnosis not present

## 2019-03-13 DIAGNOSIS — G4452 New daily persistent headache (NDPH): Secondary | ICD-10-CM | POA: Diagnosis not present

## 2019-03-13 LAB — COMPREHENSIVE METABOLIC PANEL
ALT: 15 U/L (ref 0–53)
AST: 17 U/L (ref 0–37)
Albumin: 4.2 g/dL (ref 3.5–5.2)
Alkaline Phosphatase: 76 U/L (ref 39–117)
BILIRUBIN TOTAL: 0.4 mg/dL (ref 0.2–1.2)
BUN: 17 mg/dL (ref 6–23)
CO2: 27 mEq/L (ref 19–32)
Calcium: 9.6 mg/dL (ref 8.4–10.5)
Chloride: 105 mEq/L (ref 96–112)
Creatinine, Ser: 1.02 mg/dL (ref 0.40–1.50)
GFR: 73.87 mL/min (ref >60.00)
Glucose, Bld: 97 mg/dL (ref 70–99)
Potassium: 4 mEq/L (ref 3.5–5.1)
Sodium: 139 mEq/L (ref 135–145)
Total Protein: 7.1 g/dL (ref 6.0–8.3)

## 2019-03-13 LAB — HEMOGLOBIN A1C: Hgb A1c MFr Bld: 5.9 % (ref 4.6–6.5)

## 2019-03-13 MED ORDER — AMITRIPTYLINE HCL 10 MG PO TABS: 10.0000 mg | ORAL_TABLET | Freq: Every day | ORAL | 1 refills | Status: DC

## 2019-03-13 MED ORDER — CIPROFLOXACIN-HYDROCORTISONE 0.2-1 % OT SUSP: 3.0000 [drp] | Freq: Two times a day (BID) | OTIC | 0 refills | Status: DC

## 2019-03-13 NOTE — Patient Instructions (Addendum)
You need a minimum of 12 g protein with each meal  Check blood sugar for next occurrence of feeling light headed and sweaty.   For the headaches  Start amitriptyline to  10 mg dose 30 minutes before bedtime  Ok to continue Ambien for one week only  increase amitriptyline to 20 mg after one week and stop Ambien    I am prescribing a topical antibiotic for the left ear.

## 2019-03-13 NOTE — Progress Notes (Signed)
Subjective:  Patient ID: Brian Atkins, male    DOB: May 09, 1956  Age: 63 y.o. MRN: [id number]  CC: The primary encounter diagnosis was Hypoglycemia. Diagnoses of Prediabetes, New persistent daily headache, and Acute diffuse otitis externa of left ear were also pertinent to this visit.  HPI Brian Atkins presents for follow up on chronic conditions   Has been having recurrent episodes of diaphoresis followed by feeling light headed,  Becomes hungry .  Not skipping meals.  Breakfast at 7:30   Episodes ocurring about 2-2.5 hours later.  One episode was witnessed by a GI doctor but no CBG or lab was done.  Was given a pack of crackers .  Occurring about 3   Times per week.    Breakfast reviewed:  Bagel or bowl of cereal. No protein   Migraines occurring infrequently for the past year , have been occurring  daily for the past 6 weeks.  Described as ocular migraines.  Mri recently done by [facility].  Neurology appt with Melrose Nakayama on April 8   3)  Ear pain for the last several days. Left ear only.  No sinus congestion or facial pain .  Outpatient Medications Prior to Visit  Medication Sig Dispense Refill   Alpha Lipoic Acid 200 MG CAPS Take 1 capsule by mouth 3 (three) times daily. 90 capsule 2   ALPRAZolam (XANAX) 0.25 MG tablet Take 1 tablet (0.25 mg total) by mouth at bedtime as needed for anxiety. 30 tablet 1   amLODipine (NORVASC) 2.5 MG tablet Take 1 tablet (2.5 mg total) by mouth daily. 90 tablet 3   amoxicillin-clavulanate (AUGMENTIN) 875-125 MG tablet Take 1 tablet by mouth 2 (two) times daily.     aspirin EC 81 MG tablet Take 1 tablet (81 mg total) by mouth daily. 90 tablet 3   benzonatate (TESSALON) 200 MG capsule Take 200 mg by mouth 3 (three) times daily as needed for cough.     carvedilol (COREG) 25 MG tablet TAKE 1 TABLET BY MOUTH TWICE A DAY 60 tablet 2   chlorpheniramine-HYDROcodone (TUSSIONEX PENNKINETIC ER) 10-8 MG/5ML SUER Take 5 mLs by mouth at bedtime as needed for cough.  70 mL 0   cyanocobalamin (,VITAMIN B-12,) 1000 MCG/ML injection Inject 1 mL (1,000 mcg total) into the muscle daily. For 3 days,  Then weekly for 4 weeks ,  Then monthly thereafter 10 mL 2   dicyclomine (BENTYL) 10 MG capsule TAKE ONE CAPSULE BY MOUTH 3 TIMES A DAY AS NEEDED FOR CRAMPING  0   esomeprazole (NEXIUM) 40 MG capsule Take 40 mg by mouth daily at 12 noon.     famotidine-calcium carbonate-magnesium hydroxide (PEPCID COMPLETE) 10-800-165 MG CHEW chewable tablet Chew 1 tablet by mouth 4 (four) times daily.     GAVILYTE-N WITH FLAVOR PACK 420 g solution See admin instructions. see package  0   ipratropium (ATROVENT) 0.06 % nasal spray Place 2 sprays into both nostrils 4 (four) times daily. 15 mL 12   LORazepam (ATIVAN) 1 MG tablet Take 1 tablet (1 mg total) by mouth every 8 (eight) hours as needed. 30 tablet 3   losartan (COZAAR) 100 MG tablet Take 1 tablet (100 mg total) by mouth daily. 90 tablet 2   Melatonin 3 MG TABS Take 1 tablet by mouth at bedtime.      ondansetron (ZOFRAN) 8 MG tablet Take 1 tablet (8 mg total) by mouth daily as needed. for nausea 90 tablet 1   ondansetron (ZOFRAN-ODT) 8 MG  disintegrating tablet TAKE 1 TABLET (8 MG TOTAL) BY MOUTH THREE (3) TIMES A DAY. FOR 7 DAYS  0   prochlorperazine (COMPAZINE) 5 MG tablet TAKE 1 TABLET (5 MG TOTAL) BY MOUTH EVERY SIX (6) HOURS AS NEEDED. FOR UP TO 7 DAYS  0   Syringe/Needle, Disp, (SYRINGE 3CC/25GX1") 25G X 1" 3 ML MISC Use for b12 injections 50 each 0   triamcinolone cream (KENALOG) 0.5 % Apply 1 application 2 (two) times daily as needed topically. (Patient taking differently: Apply 1 application topically 2 (two) times daily as needed (skin irritations). ) 30 g 0   zolpidem (AMBIEN) 10 MG tablet Take 1 tablet (10 mg total) by mouth at bedtime as needed for sleep. 30 tablet 5   atorvastatin (LIPITOR) 20 MG tablet Take 1 tablet (20 mg total) by mouth daily. 90 tablet 3   nitroGLYCERIN (NITROSTAT) 0.4 MG SL tablet  Place 1 tablet (0.4 mg total) under the tongue every 5 (five) minutes as needed for chest pain. Maximum of 3 doses. 35 tablet 3   spironolactone (ALDACTONE) 25 MG tablet Take 1 tablet (25 mg total) by mouth daily. 30 tablet 6   No facility-administered medications prior to visit.     Review of Systems;  Patient denies headache, fevers, malaise, unintentional weight loss, skin rash, eye pain, sinus congestion and sinus pain, sore throat, dysphagia,  hemoptysis , cough, dyspnea, wheezing, chest pain, palpitations, orthopnea, edema, abdominal pain, nausea, melena, diarrhea, constipation, flank pain, dysuria, hematuria, urinary  Frequency, nocturia, numbness, tingling, seizures,  Focal weakness, Loss of consciousness,  Tremor, insomnia, depression, anxiety, and suicidal ideation.      Objective:  BP 120/84 (BP Location: Left Arm, Patient Position: Sitting, Cuff Size: Large)    Pulse 64    Temp 98.1 F (36.7 C) (Oral)    Resp 15    Ht 6\' 2"  (1.88 m)    Wt 203 lb (92.1 kg)    SpO2 97%    BMI 26.06 kg/m   BP Readings from Last 3 Encounters:  03/13/19 120/84  10/27/18 130/62  10/23/18 130/82    Wt Readings from Last 3 Encounters:  03/13/19 203 lb (92.1 kg)  10/27/18 194 lb 12.8 oz (88.4 kg)  10/23/18 196 lb 12 oz (89.2 kg)    General appearance: alert, cooperative and appears stated age Ears: left ear canal erythematous , normal TMs  Throat: lips, mucosa, and tongue normal; teeth and gums normal Neck: no adenopathy, no carotid bruit, supple, symmetrical, trachea midline and thyroid not enlarged, symmetric, no tenderness/mass/nodules Back: symmetric, no curvature. ROM normal. No CVA tenderness. Lungs: clear to auscultation bilaterally Heart: regular rate and rhythm, S1, S2 normal, no murmur, click, rub or gallop Abdomen: soft, non-tender; bowel sounds normal; no masses,  no organomegaly Pulses: 2+ and symmetric Skin: Skin color, texture, turgor normal. No rashes or lesions Lymph nodes:  Cervical, supraclavicular, and axillary nodes normal.  Lab Results  Component Value Date   HGBA1C 5.9 03/13/2019   HGBA1C 5.8 11/21/2017   HGBA1C 5.0 03/02/2013    Lab Results  Component Value Date   CREATININE 1.02 03/13/2019   CREATININE 1.20 02/02/2019   CREATININE 1.32 07/20/2018    Lab Results  Component Value Date   WBC 7.2 07/20/2018   HGB 15.2 07/20/2018   HCT 44.7 07/20/2018   PLT 420.0 (H) 07/20/2018   GLUCOSE 97 03/13/2019   CHOL 212 (H) 03/02/2013   TRIG 168.0 (H) 03/02/2013   HDL 27.70 (L) 03/02/2013  LDLDIRECT 135.8 03/02/2013   LDLCALC 83 10/25/2011   ALT 15 03/13/2019   AST 17 03/13/2019   NA 139 03/13/2019   K 4.0 03/13/2019   CL 105 03/13/2019   CREATININE 1.02 03/13/2019   BUN 17 03/13/2019   CO2 27 03/13/2019   TSH 1.99 11/21/2017   PSA 1.92 03/02/2013   INR 1.0 11/30/2017   HGBA1C 5.9 03/13/2019    Mr Brain W ER Contrast  Result Date: 02/03/2019 CLINICAL DATA:  Initial evaluation for headaches, ocular migraines. EXAM: MRI HEAD AND ORBITS WITHOUT AND WITH CONTRAST TECHNIQUE: Multiplanar, multiecho pulse sequences of the brain and surrounding structures were obtained without and with intravenous contrast. Multiplanar, multiecho pulse sequences of the orbits and surrounding structures were obtained including fat saturation techniques, before and after intravenous contrast administration. CONTRAST:  7.5 cc of Gadavist. COMPARISON:  None. FINDINGS: MRI HEAD FINDINGS Brain: Cerebral volume within normal limits. Single subcentimeter T2/FLAIR hyperintensity noted within the left subinsular white matter, nonspecific, but of doubtful significance in isolation. No abnormal foci of restricted diffusion to suggest acute or subacute ischemia. Gray-white matter differentiation maintained. No encephalomalacia to suggest chronic infarction. No foci of susceptibility artifact to suggest acute or chronic intracranial hemorrhage. No mass lesion, midline shift or mass  effect. No hydrocephalus. No extra-axial fluid collection. Pituitary gland suprasellar region normal. Midline structures intact. No abnormal enhancement within the brain. Vascular: Major intracranial vascular flow voids are maintained. Skull and upper cervical spine: Craniocervical junction normal. Upper cervical spine normal. Bone marrow signal intensity within normal limits. No scalp soft tissue abnormality. Other: No mastoid effusion.  Inner ear structures normal. MRI ORBITS FINDINGS Orbits: Globes are symmetric in size with normal morphology and appearance bilaterally. Optic nerves symmetric and normal without edema or abnormal enhancement. No abnormality at the orbital apices. Optic chiasm normally situated within the suprasellar cistern. Extra-ocular muscles symmetric and normal. Extra-ocular muscles normal in appearance bilaterally. Lacrimal glands normal. Superior orbital veins within normal limits. No abnormality about the cavernous sinus. Visualized sinuses: Left maxillary sinus retention cyst. Paranasal sinuses are otherwise clear. Soft tissues: Periorbital soft tissues are normal. IMPRESSION: Normal MRI of the brain and orbits. No structural findings to explain patient's symptoms identified. Electronically Signed   By: Jeannine Boga M.D.   On: 02/03/2019 06:35   Mr Rosealee Albee XV Contrast  Result Date: 02/03/2019 CLINICAL DATA:  Initial evaluation for headaches, ocular migraines. EXAM: MRI HEAD AND ORBITS WITHOUT AND WITH CONTRAST TECHNIQUE: Multiplanar, multiecho pulse sequences of the brain and surrounding structures were obtained without and with intravenous contrast. Multiplanar, multiecho pulse sequences of the orbits and surrounding structures were obtained including fat saturation techniques, before and after intravenous contrast administration. CONTRAST:  7.5 cc of Gadavist. COMPARISON:  None. FINDINGS: MRI HEAD FINDINGS Brain: Cerebral volume within normal limits. Single subcentimeter  T2/FLAIR hyperintensity noted within the left subinsular white matter, nonspecific, but of doubtful significance in isolation. No abnormal foci of restricted diffusion to suggest acute or subacute ischemia. Gray-white matter differentiation maintained. No encephalomalacia to suggest chronic infarction. No foci of susceptibility artifact to suggest acute or chronic intracranial hemorrhage. No mass lesion, midline shift or mass effect. No hydrocephalus. No extra-axial fluid collection. Pituitary gland suprasellar region normal. Midline structures intact. No abnormal enhancement within the brain. Vascular: Major intracranial vascular flow voids are maintained. Skull and upper cervical spine: Craniocervical junction normal. Upper cervical spine normal. Bone marrow signal intensity within normal limits. No scalp soft tissue abnormality. Other: No mastoid effusion.  [place]  ear structures normal. MRI ORBITS FINDINGS Orbits: Globes are symmetric in size with normal morphology and appearance bilaterally. Optic nerves symmetric and normal without edema or abnormal enhancement. No abnormality at the orbital apices. Optic chiasm normally situated within the suprasellar cistern. Extra-ocular muscles symmetric and normal. Extra-ocular muscles normal in appearance bilaterally. Lacrimal glands normal. Superior orbital veins within normal limits. No abnormality about the cavernous sinus. Visualized sinuses: Left maxillary sinus retention cyst. Paranasal sinuses are otherwise clear. Soft tissues: Periorbital soft tissues are normal. IMPRESSION: Normal MRI of the brain and orbits. No structural findings to explain patient's symptoms identified. Electronically Signed   By: Jeannine Boga M.D.   On: 02/03/2019 06:35    Assessment & Plan:   Problem List Items Addressed This Visit    Prediabetes    His  random glucose is not  elevated but her A1c suggests she is at risk for developing diabetes.  I recommend he follow a low  glycemic index diet and increase his protein intake .  We should check an A1c in 6 months.  Lab Results  Component Value Date   HGBA1C 5.9 03/13/2019           New persistent daily headache    MRI brain done by ENT and negative for metastatic mass. Neurology appt is 3 weeks off.  Trial of low dose elavil,  Suspend ambien       Relevant Medications   amitriptyline (ELAVIL) 10 MG tablet   Otitis externa of left ear    Topical antibiotics prescribed       Other Visit Diagnoses    Hypoglycemia    -  Primary   Relevant Orders   Comprehensive metabolic panel (Completed)   Hemoglobin A1c (Completed)      I am having Brian Atkins start on amitriptyline and ciprofloxacin-hydrocortisone. I am also having him maintain his esomeprazole, famotidine-calcium carbonate-magnesium hydroxide, LORazepam, Melatonin, ipratropium, ondansetron, triamcinolone cream, aspirin EC, nitroGLYCERIN, atorvastatin, losartan, spironolactone, cyanocobalamin, SYRINGE 3CC/25GX1", Alpha Lipoic Acid, dicyclomine, ondansetron, GaviLyte-N with Flavor Pack, prochlorperazine, amLODipine, ALPRAZolam, amoxicillin-clavulanate, benzonatate, chlorpheniramine-HYDROcodone, carvedilol, and zolpidem.  Meds ordered this encounter  Medications   amitriptyline (ELAVIL) 10 MG tablet    Sig: Take 1 tablet (10 mg total) by mouth at bedtime.    Dispense:  90 tablet    Refill:  1   ciprofloxacin-hydrocortisone (CIPRO HC) OTIC suspension    Sig: Place 3 drops into the left ear 2 (two) times daily.    Dispense:  10 mL    Refill:  0    There are no discontinued medications.  Follow-up: No follow-ups on file.   [name], MD

## 2019-03-14 DIAGNOSIS — R7303 Prediabetes: Secondary | ICD-10-CM | POA: Insufficient documentation

## 2019-03-14 DIAGNOSIS — G4452 New daily persistent headache (NDPH): Secondary | ICD-10-CM | POA: Insufficient documentation

## 2019-03-14 DIAGNOSIS — H6092 Unspecified otitis externa, left ear: Secondary | ICD-10-CM | POA: Insufficient documentation

## 2019-03-14 NOTE — Assessment & Plan Note (Signed)
Topical antibiotics prescribed

## 2019-03-14 NOTE — Assessment & Plan Note (Addendum)
His  random glucose is not  elevated but her A1c suggests she is at risk for developing diabetes.  I recommend he follow a low glycemic index diet and increase his protein intake .  We should check an A1c in 6 months.  Lab Results  Component Value Date   HGBA1C 5.9 03/13/2019

## 2019-03-14 NOTE — Assessment & Plan Note (Signed)
MRI brain done by ENT and negative for metastatic mass. Neurology appt is 3 weeks off.  Trial of low dose elavil,  Suspend ambien

## 2019-03-19 MED ORDER — LOSARTAN POTASSIUM 100 MG PO TABS: 100.0000 mg | ORAL_TABLET | Freq: Every day | ORAL | 2 refills | Status: DC

## 2019-03-23 ENCOUNTER — Other Ambulatory Visit: Payer: Self-pay | Admitting: Internal Medicine

## 2019-03-23 MED ORDER — GLUCOSE BLOOD VI STRP: ORAL_STRIP | 0 refills | Status: DC

## 2019-03-23 NOTE — Progress Notes (Signed)
One touCH TEST STRIPS SENT

## 2019-03-29 DIAGNOSIS — G43109 Migraine with aura, not intractable, without status migrainosus: Secondary | ICD-10-CM | POA: Diagnosis not present

## 2019-03-31 ENCOUNTER — Other Ambulatory Visit: Payer: Self-pay | Admitting: Internal Medicine

## 2019-05-09 DIAGNOSIS — T783XXS Angioneurotic edema, sequela: Secondary | ICD-10-CM | POA: Diagnosis not present

## 2019-05-09 DIAGNOSIS — Z79899 Other long term (current) drug therapy: Secondary | ICD-10-CM | POA: Diagnosis not present

## 2019-05-09 DIAGNOSIS — C8599 Non-Hodgkin lymphoma, unspecified, extranodal and solid organ sites: Secondary | ICD-10-CM | POA: Diagnosis not present

## 2019-06-15 ENCOUNTER — Other Ambulatory Visit: Payer: Self-pay | Admitting: Internal Medicine

## 2019-06-26 ENCOUNTER — Other Ambulatory Visit: Payer: Self-pay | Admitting: Internal Medicine

## 2019-07-04 ENCOUNTER — Other Ambulatory Visit: Payer: Self-pay | Admitting: Internal Medicine

## 2019-07-18 ENCOUNTER — Ambulatory Visit: Payer: Self-pay | Admitting: Otolaryngology

## 2019-08-16 ENCOUNTER — Telehealth: Payer: Self-pay | Admitting: Internal Medicine

## 2019-08-16 NOTE — Telephone Encounter (Signed)
   Primary Cardiologist:Christopher End, MD  Chart reviewed as part of pre-operative protocol coverage. Because of Brian Atkins past medical history and time since last visit, he/she will require a follow-up visit in order to better assess preoperative cardiovascular risk.  Pre-op covering staff: - Please schedule appointment and call patient to inform them. - Please contact requesting surgeon's office via preferred method (i.e, phone, fax) to inform them of need for appointment prior to surgery.  If applicable, this message will also be routed to pharmacy pool and/or primary cardiologist for input on holding anticoagulant/antiplatelet agent as requested below so that this information is available at time of patient's appointment.   [place], [place]  08/16/2019, 4:19 PM

## 2019-08-16 NOTE — Anesthesia Preprocedure Evaluation (Addendum)
Anesthesia Evaluation  Patient identified by MRN, date of birth, ID band Patient awake    Reviewed: Allergy & Precautions, H&P , NPO status , Patient's Chart, lab work & pertinent test results  Airway Mallampati: II  TM Distance: >3 FB Neck ROM: full    Dental no notable dental hx.    Pulmonary sleep apnea ,  Hx angioedema 2015 s/p chemorad   Pulmonary exam normal breath sounds clear to auscultation       Cardiovascular hypertension, + CAD (nonocclusive)  Normal cardiovascular exam Rhythm:regular Rate:Normal  ECG 05/04/18: NSR, normal ECG   Neuro/Psych  Headaches,  Neuromuscular disease (merlagia paresthetica)    GI/Hepatic GERD (s/p Nissen)  ,Motility disorder   Endo/Other  Prediabetes   Renal/GU      Musculoskeletal   Abdominal   Peds  Hematology MALT lymphoma s/p chemo, rad, splenectomy   Anesthesia Other Findings 08/21/2019  1. Preoperative cardiac risk stratification: Patient is scheduled for septoplasty with bilateral inferior turbinate reduction on 08/23/2019.  Revised Cardiac Index indicates he is low risk for a low risk surgery.  He is able to achieve 29.45 METs per the [facility] Activity Status Index.  EKG in office today demonstrates no ischemic changes.  With this, the patient is able to proceed for noncardiac surgery at an overall low risk without further cardiac work-up.  2. Nonobstructive CAD: Prior diagnostic cath in 11/2017 demonstrated mild to moderate nonobstructive disease with nonsignificant FFR involving the LAD and RCA lesions.  Of note, patient self discontinued aspirin last year.  I have discussed with him the reasoning for our recommendation in continuing this medicine.  In light of him appearing to undergo surgery on 9/3 as outlined above, we will continue to defer restarting this medication at this time until he is cleared from his ENT surgery and postoperative follow-up to restart aspirin 81 mg daily.   We have also recommended he restart statin therapy as outlined below.  Aggressive resector modification is recommended.  3. HTN: Blood pressure is well controlled in the office today.  Recent renal function normal and potassium at goal.  Continue losartan and Coreg.  Patient prefers to continue amlodipine 2.5 mg as needed at nighttime.  4. HLD: Patient previously held Lipitor in the setting of headache without improvement in symptoms.  He was subsequently instructed to restart Lipitor in 2019, however he never did.  I have discussed with him the benefits of statin therapy with regards to moderate nonobstructive CAD.  We have agreed to undergo a rechallenge of Crestor 5 mg Monday, Wednesday, and Friday.  If he is able to tolerate this we will plan to recheck a fasting lipid and liver panel in 2 months.  Reproductive/Obstetrics                          Anesthesia Physical Anesthesia Plan  ASA: II  Anesthesia Plan: General ETT   Post-op Pain Management:    Induction:   PONV Risk Score and Plan: 2 and Ondansetron, Dexamethasone, Scopolamine patch - Pre-op and Treatment may vary due to age or medical condition  Airway Management Planned: Oral ETT  Additional Equipment:   Intra-op Plan:   Post-operative Plan:   Informed Consent: I have reviewed the patients History and Physical, chart, labs and discussed the procedure including the risks, benefits and alternatives for the proposed anesthesia with the patient or authorized representative who has indicated his/her understanding and acceptance.       Plan  Discussed with: CRNA  Anesthesia Plan Comments: (Hx angioedema s/p radiation Tx for Lymphoma, requiring intubation in 2015; received C1 esterase inhibitor preop for splenectomy in 2016 due to high lymphoma load at the time, but did not receive prior to other anesthetics in 2019.  No anesthesia complications for last procedure, inguinal hernia repair on 09/27/18.   Easy mask, 7.5 ETT placed 1st attempt under grade IIa view via DL.  Medications included lidocaine, fentanyl, propofol, dexamethasone, ondansetron, rocuronium, sugammadex, cefazolin.)      Anesthesia Quick Evaluation

## 2019-08-16 NOTE — Telephone Encounter (Signed)
   [facility] Pre-operative Risk Assessment    Request for surgical clearance:  1. What type of surgery is being performed? SEPTOPLASTY, BILATERAL INFERIOR TURBINATE REDUCTION  2. When is this surgery scheduled? 08/23/19  3. What type of clearance is required (medical clearance vs. Pharmacy clearance to hold med vs. Both)? MEDICAL  4. Are there any medications that need to be held prior to surgery and how long? N/A  5. Practice name and name of physician performing surgery? [facility]/DR PAUL JUENGAL  6. What is your office phone number [phone]   7.   What is your office fax number [phone]  8.   Anesthesia type (None, local, MAC, general) ? GENERAL   Lucienne Minks 08/16/2019, 3:36 PM  _________________________________________________________________   (provider comments below)

## 2019-08-17 NOTE — Telephone Encounter (Signed)
No ans no vm .    Fwd to scheduling pool for further attempts

## 2019-08-19 ENCOUNTER — Other Ambulatory Visit: Payer: Self-pay | Admitting: Internal Medicine

## 2019-08-19 MED ORDER — ZOLPIDEM TARTRATE 10 MG PO TABS: 10.0000 mg | ORAL_TABLET | Freq: Every evening | ORAL | 5 refills | Status: DC | PRN

## 2019-08-19 NOTE — Progress Notes (Signed)
Zolpidem refills authorized and sent

## 2019-08-20 ENCOUNTER — Other Ambulatory Visit
Admission: RE | Admit: 2019-08-20 | Discharge: 2019-08-20 | Disposition: A | Discharge status: Home / Self Care | Payer: 59 | Source: Ambulatory Visit | Attending: Otolaryngology | Admitting: Otolaryngology

## 2019-08-20 ENCOUNTER — Encounter: Payer: Self-pay | Admitting: *Deleted

## 2019-08-20 ENCOUNTER — Other Ambulatory Visit: Payer: Self-pay

## 2019-08-20 DIAGNOSIS — Z01812 Encounter for preprocedural laboratory examination: Secondary | ICD-10-CM | POA: Diagnosis present

## 2019-08-20 DIAGNOSIS — Z20828 Contact with and (suspected) exposure to other viral communicable diseases: Secondary | ICD-10-CM | POA: Insufficient documentation

## 2019-08-20 LAB — SARS CORONAVIRUS 2 (TAT 6-24 HRS): SARS Coronavirus 2: NEGATIVE

## 2019-08-20 NOTE — Telephone Encounter (Signed)
LVM for patient to call back. ?

## 2019-08-20 NOTE — Progress Notes (Signed)
Cardiology Office Note    Date:  08/21/2019   ID:  Brian Atkins, DOB 13-Nov-1956, MRN [id number]  PCP:  [name], MD  Cardiologist:  Nelva Bush, MD  Electrophysiologist:  None   Chief Complaint: Preoperative cardiac evaluation  History of Present Illness:   Brian Atkins is a 63 y.o. male with history of nonobstructive CAD by [place] in 11/2017, HTN, marginal zone B-cell lymphoma, headache disorder, diverticulitis, and GERD status post Nissen fundoplication who presents for preoperative cardiac evaluation.  Patient was seen by [name] in 11/2017 with elevated blood pressure, palpitations, headaches, and chest pain.  He subsequently underwent LHC which revealed moderate, nonobstructive CAD involving the proximal/mid LAD and mid RCA.  Both lesions were not hemodynamically significant by FFR.  LVEF greater than 65%, moderately elevated LVEDP consistent with diastolic dysfunction.  Medical therapy and risk factor modification was recommended.  TTE 12/29/17 showed an EF of 60-65%, normal wall motion, normal LV diastolic function, mildly dilated LA.  At the patient's follow-up on 01/04/2018, he felt like he was breathing a little bit better.  However, he continued to have occasional chest tightness that seemed to correspond with his headaches and elevated BP readings.  His home blood pressure readings were somewhat labile and typically elevated up to 165/110 in the setting of his headaches.  His headaches were noted to be frequently accompanied by vision changes including floaters and flashes.  The patient was concerned that Lipitor may have been contributing to his headaches, thus he had self discontinued Lipitor prior to his office visit in 12/2017.  Following the self discontinuation of Lipitor he did not notice any change in his headaches.  At that time, the patient had never been evaluated by a neurologist.  Prior MRI of the brain (preceding cardiac catheterization) did not show any significant  intracranial abnormalities.  Given the patient's son's history of adrenal tumor it was recommended that the patient be excluded for secondary causes of hypertension including pheochromocytoma and hyperaldosteronism.  Catecholamines/metanephrines showed no evidence of pheochromocytoma.  Aldosterone/renin activity ratio showed a minimally elevated aldosterone/plasma renin activity ratio, though with a serum aldosterone level that was within normal limits.  He was started on spironolactone in 01/2018.  Follow-up labs in 02/2018 showed a mild elevation in his serum creatinine and potassium at goal.  It was recommended he change his Lasix to as needed dosing.  Patient saw PCP in late 02/2018 noting paresthesias of the bilateral upper extremities.  B12 was low.  C-spine plain films showed DDD. MRI of the cervical spine in 03/2018 was nonacute.  He was most recently seen by cardiology in 04/2018 for routine follow-up and was doing well from a cardiac perspective.  He did note some palpitations though declined outpatient cardiac monitoring.  Given mildly elevated blood pressure hydralazine was added to his regimen.  We received preoperative surgical request for septoplasty/bilateral inferior turbinate reduction on 8/27 with procedure being scheduled for 9/3.  Patient comes in doing well from a cardiac perspective.  He denies any chest pain, shortness of breath, palpitations, dizziness, presyncope, or syncope.  No lower extremity swelling, abdominal distention, orthopnea, PND, early satiety.  No falls, BRBPR, or melena.  Patient is no longer taking aspirin, Lipitor, or spironolactone.  Blood pressure at home is typically running in the [id number] to 0000000 systolic.  With this, he will take amlodipine 2.5 mg in the evening as needed.  Current scheduled antihypertensive regimen is carvedilol 25 mg twice daily and losartan  100 mg daily.  Patient is able to achieve 29.45 METs per [facility] Activity Status Index.   Labs: 04/2019 - TSH  normal 02/2019 - A1c 5.9, potassium 4.0, serum creatinine 1.02, albumin 4.2, AST/ALT normal 07/2018 -magnesium 2.1, WBC 7.2, Hgb 15.2, PLT 420  Past Medical History:  Diagnosis Date   Complication of anesthesia    slow to wake   Coronary artery disease, non-occlusive    a. LHC 12/18: ostLAD 20%, p/mLAD 60% FFR 0.84, ost ramus 50%, mRCA 50% FFR 0.94, EF 65%   Diverticulitis    Diverticulosis    Dyslipidemia    Family history of adverse reaction to anesthesia    Mother - slow to wake   GERD (gastroesophageal reflux disease)    Hemorrhoids    Hiatal hernia    History of echocardiogram    a. TTE 1/19: EF of 60-65%, normal wall motion, normal LV diastolic function, mildly dilated LA   Labile hypertension    Lymphoma (HCC)    Medial epicondylitis of right elbow    Meralgia paresthetica of right side    Microscopic hematuria    Ocular migraine    Sleep apnea    CPAP    Past Surgical History:  Procedure Laterality Date   INTRAVASCULAR PRESSURE WIRE/FFR STUDY N/A 12/07/2017   Procedure: INTRAVASCULAR PRESSURE WIRE/FFR STUDY;  Surgeon: Nelva Bush, MD;  Location: [facility];  Service: Cardiovascular;  Laterality: N/A;   LEFT HEART CATH AND CORONARY ANGIOGRAPHY Left 12/07/2017   Procedure: LEFT HEART CATH AND CORONARY ANGIOGRAPHY;  Surgeon: Nelva Bush, MD;  Location: [facility];  Service: Cardiovascular;  Laterality: Left;   nissen funduplication  [id number]   [name]   SPLENECTOMY      Current Medications: Current Meds  Medication Sig   Alpha Lipoic Acid 200 MG CAPS Take 1 capsule by mouth 3 (three) times daily.   ALPRAZolam (XANAX) 0.25 MG tablet Take 1 tablet (0.25 mg total) by mouth at bedtime as needed for anxiety.   amitriptyline (ELAVIL) 10 MG tablet TAKE 1 TABLET BY MOUTH EVERYDAY AT BEDTIME   amLODipine (NORVASC) 2.5 MG tablet TAKE 1 TABLET BY MOUTH EVERY DAY   carvedilol (COREG) 25 MG tablet TAKE 1 TABLET BY MOUTH  TWICE A DAY   cyanocobalamin (,VITAMIN B-12,) 1000 MCG/ML injection INJECT 1 ML INTO THE MUSCLE DAILY FOR 3 DAYS, THEN WEEKLY FOR 4 WEEKS , THEN MONTHLY THEREAFTER   dexlansoprazole (DEXILANT) 60 MG capsule Take 60 mg by mouth 2 (two) times daily.   dicyclomine (BENTYL) 10 MG capsule TAKE ONE CAPSULE BY MOUTH 3 TIMES A DAY AS NEEDED FOR CRAMPING   GAVILYTE-N WITH FLAVOR PACK 420 g solution See admin instructions. see package   ipratropium (ATROVENT) 0.06 % nasal spray Place 2 sprays into both nostrils 4 (four) times daily.   LORazepam (ATIVAN) 1 MG tablet Take 1 tablet (1 mg total) by mouth every 8 (eight) hours as needed.   losartan (COZAAR) 100 MG tablet Take 1 tablet (100 mg total) by mouth daily.   Melatonin 3 MG TABS Take 10 mg by mouth at bedtime.    ONETOUCH VERIO test strip USE TO CHECK BLOOD SUGAR AS NEEDED   prochlorperazine (COMPAZINE) 5 MG tablet TAKE 1 TABLET (5 MG TOTAL) BY MOUTH EVERY SIX (6) HOURS AS NEEDED. FOR UP TO 7 DAYS   Syringe/Needle, Disp, (SYRINGE 3CC/25GX1") 25G X 1" 3 ML MISC Use for b12 injections   triamcinolone cream (KENALOG) 0.5 % Apply 1  application 2 (two) times daily as needed topically.   zolpidem (AMBIEN) 10 MG tablet Take 1 tablet (10 mg total) by mouth at bedtime as needed for sleep.    Allergies:   Avelox [moxifloxacin], Contrast media [iodinated diagnostic agents], Crestor [rosuvastatin calcium], Morphine and related, Niacin-simvastatin er, Latex, and Tape   Social History   Socioeconomic History   Marital status: Married    Spouse name: Not on file   Number of children: 2   Years of education: 14   Highest education level: Not on file  Occupational History   Occupation: Shop Printmaker: [employer]    Comment: Environmental manager strain: Not on file   Food insecurity    Worry: Not on file    Inability: Not on file   Transportation needs    Medical: Not on file    Non-medical: Not  on file  Tobacco Use   Smoking status: Never Smoker   Smokeless tobacco: Never Used  Substance and Sexual Activity   Alcohol use: No   Drug use: No   Sexual activity: Yes  Lifestyle   Physical activity    Days per week: Not on file    Minutes per session: Not on file   Stress: Not on file  Relationships   Social connections    Talks on phone: Not on file    Gets together: Not on file    Attends religious service: Not on file    Active member of club or organization: Not on file    Attends meetings of clubs or organizations: Not on file    Relationship status: Not on file  Other Topics Concern   Not on file  Social History Narrative   Not on file     Family History:  The patient's family history includes Aortic aneurysm (age of onset: 35) in his father; COPD in his mother; Cancer in his mother; Coronary artery disease (age of onset: 2) in his father; Hyperlipidemia in his father; Hypertension in his mother; Lung cancer in his mother; Stomach cancer in his maternal grandfather.  ROS:   Review of Systems  Constitutional: Negative for chills, diaphoresis, fever, malaise/fatigue and weight loss.  HENT: Positive for congestion.   Eyes: Negative for discharge and redness.  Respiratory: Negative for cough, hemoptysis, sputum production, shortness of breath and wheezing.   Cardiovascular: Negative for chest pain, palpitations, orthopnea, claudication, leg swelling and PND.  Gastrointestinal: Negative for abdominal pain, blood in stool, heartburn, melena, nausea and vomiting.  Genitourinary: Negative for hematuria.  Musculoskeletal: Negative for falls and myalgias.  Skin: Negative for rash.  Neurological: Positive for headaches. Negative for dizziness, tingling, tremors, sensory change, speech change, focal weakness, loss of consciousness and weakness.  Endo/Heme/Allergies: Does not bruise/bleed easily.  Psychiatric/Behavioral: Negative for substance abuse. The patient is  not nervous/anxious.   All other systems reviewed and are negative.    EKGs/Labs/Other Studies Reviewed:    Studies reviewed were summarized above. The additional studies were reviewed today:  LHC 11/2017: Conclusions: 1. Moderate, non-obstructive coronary artery disease involving the proximal/mid LAD and mid RCA. Both lesions are not hemodynamically significant by FFR. 2. Hyperdynamic left ventricular contraction (LVEF >65%). 3. Moderately elevated left ventricular filling pressure, consistent with diastolic dysfunction.  Recommendations: 1. Medical therapy and risk factor modification to prevent progression of CAD. Will increase carvedilol to 6.25 mg BID and add atorvastatin 20 mg daily. 2. Initiate furosemide 20 mg daily  and repeat BMP in ~2 weeks. 3. Consider transthoracic echo to evaluate for other structural abnormalities. __________  2D Echo 12/2017: - Left ventricle: The cavity size was normal. There was mild   concentric hypertrophy. Systolic function was normal. The   estimated ejection fraction was in the range of 60% to 65%. Wall   motion was normal; there were no regional wall motion   abnormalities. Left ventricular diastolic function parameters   were normal. - Left atrium: The atrium was mildly dilated.   EKG:  EKG is ordered today.  The EKG ordered today demonstrates NSR, 62 bpm, normal axis, baseline wandering, no acute ST-T changes  Recent Labs: 03/13/2019: ALT 15; BUN 17; Creatinine, Ser 1.02; Potassium 4.0; Sodium 139  Recent Lipid Panel    Component Value Date/Time   CHOL 212 (H) 03/02/2013 1119   TRIG 168.0 (H) 03/02/2013 1119   HDL 27.70 (L) 03/02/2013 1119   CHOLHDL 8 03/02/2013 1119   VLDL 33.6 03/02/2013 1119   LDLCALC 83 10/25/2011 1046   LDLDIRECT 135.8 03/02/2013 1119    PHYSICAL EXAM:    VS:  BP 122/64 (BP Location: Left Arm, Patient Position: Sitting, Cuff Size: Normal)    Pulse 62    Ht 6\' 2"  (1.88 m)    Wt 205 lb (93 kg)    BMI 26.32  kg/m   BMI: Body mass index is 26.32 kg/m.  Physical Exam  Constitutional: He is oriented to person, place, and time. He appears well-developed and well-nourished.  HENT:  Head: Normocephalic and atraumatic.  Eyes: Right eye exhibits no discharge. Left eye exhibits no discharge.  Neck: Normal range of motion. No JVD present.  Cardiovascular: Normal rate, regular rhythm, S1 normal, S2 normal and normal heart sounds. Exam reveals no distant heart sounds, no friction rub, no midsystolic click and no opening snap.  No murmur heard. Pulses:      Posterior tibial pulses are 2+ on the right side and 2+ on the left side.  Pulmonary/Chest: Effort normal and breath sounds normal. No respiratory distress. He has no decreased breath sounds. He has no wheezes. He has no rales. He exhibits no tenderness.  Abdominal: Soft. He exhibits no distension. There is no abdominal tenderness.  Musculoskeletal:        General: No edema.  Neurological: He is alert and oriented to person, place, and time.  Skin: Skin is warm and dry. No cyanosis. Nails show no clubbing.  Psychiatric: He has a normal mood and affect. His speech is normal and behavior is normal. Judgment and thought content normal.    Wt Readings from Last 3 Encounters:  08/21/19 205 lb (93 kg)  03/13/19 203 lb (92.1 kg)  10/27/18 194 lb 12.8 oz (88.4 kg)     ASSESSMENT & PLAN:   1. Preoperative cardiac risk stratification: Patient is scheduled for septoplasty with bilateral inferior turbinate reduction on 08/23/2019.  Revised Cardiac Index indicates he is low risk for a low risk surgery.  He is able to achieve 29.45 METs per the [facility] Activity Status Index.  EKG in office today demonstrates no ischemic changes.  With this, the patient is able to proceed for noncardiac surgery at an overall low risk without further cardiac work-up.  2. Nonobstructive CAD: Prior diagnostic cath in 11/2017 demonstrated mild to moderate nonobstructive disease with  nonsignificant FFR involving the LAD and RCA lesions.  Of note, patient self discontinued aspirin last year.  I have discussed with him the reasoning for our recommendation in  continuing this medicine.  In light of him appearing to undergo surgery on 9/3 as outlined above, we will continue to defer restarting this medication at this time until he is cleared from his ENT surgery and postoperative follow-up to restart aspirin 81 mg daily.  We have also recommended he restart statin therapy as outlined below.  Aggressive resector modification is recommended.  3. HTN: Blood pressure is well controlled in the office today.  Recent renal function normal and potassium at goal.  Continue losartan and Coreg.  Patient prefers to continue amlodipine 2.5 mg as needed at nighttime.  4. HLD: Patient previously held Lipitor in the setting of headache without improvement in symptoms.  He was subsequently instructed to restart Lipitor in 2019, however he never did.  I have discussed with him the benefits of statin therapy with regards to moderate nonobstructive CAD.  We have agreed to undergo a rechallenge of Crestor 5 mg Monday, Wednesday, and Friday.  If he is able to tolerate this we will plan to recheck a fasting lipid and liver panel in 2 months.  Disposition: F/u with [name] or an APP in 3 months.   Medication Adjustments/Labs and Tests Ordered: Current medicines are reviewed at length with the patient today.  Concerns regarding medicines are outlined above. Medication changes, Labs and Tests ordered today are summarized above and listed in the Patient Instructions accessible in Encounters.   Signed, [name], PA-C 08/21/2019 1:32 PM     [facility] [address] [place] [address] [place], [place] [address] [phone]

## 2019-08-21 ENCOUNTER — Encounter: Payer: Self-pay | Admitting: Physician Assistant

## 2019-08-21 ENCOUNTER — Ambulatory Visit (INDEPENDENT_AMBULATORY_CARE_PROVIDER_SITE_OTHER): Payer: 59 | Admitting: Physician Assistant

## 2019-08-21 VITALS — BP 122/64 | HR 62 | Ht 74.0 in | Wt 205.0 lb

## 2019-08-21 DIAGNOSIS — I10 Essential (primary) hypertension: Secondary | ICD-10-CM

## 2019-08-21 DIAGNOSIS — E785 Hyperlipidemia, unspecified: Secondary | ICD-10-CM

## 2019-08-21 DIAGNOSIS — Z01810 Encounter for preprocedural cardiovascular examination: Secondary | ICD-10-CM | POA: Diagnosis not present

## 2019-08-21 DIAGNOSIS — I2510 Atherosclerotic heart disease of native coronary artery without angina pectoris: Secondary | ICD-10-CM | POA: Diagnosis not present

## 2019-08-21 MED ORDER — ROSUVASTATIN CALCIUM 5 MG PO TABS: ORAL_TABLET | ORAL | 3 refills | Status: DC

## 2019-08-21 MED ORDER — CARVEDILOL 25 MG PO TABS: 25.0000 mg | ORAL_TABLET | Freq: Two times a day (BID) | ORAL | 2 refills | Status: DC

## 2019-08-21 NOTE — Patient Instructions (Signed)
Medication Instructions:  1- START Crestor Take 1 tablet (5 mg total) once daily on m/w/f.  If you need a refill on your cardiac medications before your next appointment, please call your pharmacy.   Lab work: 1- Your physician recommends that you return for lab work in: 8 weeks at [facility]. You will need to be fasting.  No appt is needed. Hours are M-F 7AM- 6 PM.  If you have labs (blood work) drawn today and your tests are completely normal, you will receive your results only by: [name] MyChart Message (if you have MyChart) OR . A paper copy in the mail If you have any lab test that is abnormal or we need to change your treatment, we will call you to review the results.  Testing/Procedures: None ordered  Follow-Up: At [facility], you and your health needs are our priority.  As part of our continuing mission to provide you with exceptional heart care, we have created designated Provider Care Teams.  These Care Teams include your primary Cardiologist (physician) and Advanced Practice Providers (APPs -  Physician Assistants and Nurse Practitioners) who all work together to provide you with the care you need, when you need it. You will need a follow up appointment in 3 months. You may see Nelva Bush, MD or [name], PA-C.

## 2019-08-22 ENCOUNTER — Ambulatory Visit: Payer: 59 | Admitting: Nurse Practitioner

## 2019-08-22 ENCOUNTER — Telehealth: Payer: Self-pay | Admitting: Internal Medicine

## 2019-08-22 NOTE — Telephone Encounter (Signed)
[facility] calling to check status of clearance - procedure scheduled for 9/3 Patient completed office visit on 9/1 Please advise and fax to [phone]

## 2019-08-22 NOTE — Telephone Encounter (Signed)
Will sign once back in the office.

## 2019-08-22 NOTE — Telephone Encounter (Signed)
Routing to [place] to make him aware to look on his desk in the morning. Anesthesia is requiring provider to sign the form.

## 2019-08-22 NOTE — Telephone Encounter (Signed)
Office is requiring anesthesia form be completed.  Placed on RD desk for return fax asap .  Sx in the morning.

## 2019-08-22 NOTE — Telephone Encounter (Signed)
   Primary Cardiologist: Nelva Bush, MD  Chart reviewed as part of pre-operative protocol coverage.  The patient was seen in the office on 08/21/2019 by [name], PA with the following recommendation:  Preoperative cardiac risk stratification: Patient is scheduled for septoplasty with bilateral inferior turbinate reduction on 08/23/2019.  Revised Cardiac Index indicates he is low risk for a low risk surgery.  He is able to achieve 29.45 METs per the [facility] Activity Status Index.  EKG in office today demonstrates no ischemic changes.  With this, the patient is able to proceed for noncardiac surgery at an overall low risk without further cardiac work-up.  I will route this recommendation to the requesting party via Epic fax function and remove from pre-op pool.  Please call with questions.  Daune Perch, NP 08/22/2019, 11:46 AM

## 2019-08-23 ENCOUNTER — Encounter
Admission: RE | Disposition: A | Discharge status: Home / Self Care | Payer: Self-pay | Source: Home / Self Care | Attending: Otolaryngology

## 2019-08-23 ENCOUNTER — Ambulatory Visit
Admission: RE | Admit: 2019-08-23 | Discharge: 2019-08-23 | Disposition: A | Discharge status: Home / Self Care | Payer: 59 | Attending: Otolaryngology | Admitting: Otolaryngology

## 2019-08-23 ENCOUNTER — Ambulatory Visit: Payer: 59 | Admitting: Anesthesiology

## 2019-08-23 ENCOUNTER — Other Ambulatory Visit: Payer: Self-pay | Admitting: Internal Medicine

## 2019-08-23 ENCOUNTER — Other Ambulatory Visit: Payer: Self-pay

## 2019-08-23 DIAGNOSIS — Z9221 Personal history of antineoplastic chemotherapy: Secondary | ICD-10-CM | POA: Diagnosis not present

## 2019-08-23 DIAGNOSIS — I2510 Atherosclerotic heart disease of native coronary artery without angina pectoris: Secondary | ICD-10-CM | POA: Diagnosis not present

## 2019-08-23 DIAGNOSIS — R7303 Prediabetes: Secondary | ICD-10-CM | POA: Insufficient documentation

## 2019-08-23 DIAGNOSIS — G4730 Sleep apnea, unspecified: Secondary | ICD-10-CM | POA: Insufficient documentation

## 2019-08-23 DIAGNOSIS — J3489 Other specified disorders of nose and nasal sinuses: Secondary | ICD-10-CM | POA: Diagnosis not present

## 2019-08-23 DIAGNOSIS — E785 Hyperlipidemia, unspecified: Secondary | ICD-10-CM | POA: Insufficient documentation

## 2019-08-23 DIAGNOSIS — Z91040 Latex allergy status: Secondary | ICD-10-CM | POA: Diagnosis not present

## 2019-08-23 DIAGNOSIS — R51 Headache: Secondary | ICD-10-CM | POA: Insufficient documentation

## 2019-08-23 DIAGNOSIS — J342 Deviated nasal septum: Secondary | ICD-10-CM | POA: Insufficient documentation

## 2019-08-23 DIAGNOSIS — G5710 Meralgia paresthetica, unspecified lower limb: Secondary | ICD-10-CM | POA: Diagnosis not present

## 2019-08-23 DIAGNOSIS — K219 Gastro-esophageal reflux disease without esophagitis: Secondary | ICD-10-CM | POA: Insufficient documentation

## 2019-08-23 DIAGNOSIS — Z888 Allergy status to other drugs, medicaments and biological substances status: Secondary | ICD-10-CM | POA: Diagnosis not present

## 2019-08-23 DIAGNOSIS — Z9081 Acquired absence of spleen: Secondary | ICD-10-CM | POA: Insufficient documentation

## 2019-08-23 DIAGNOSIS — Z79899 Other long term (current) drug therapy: Secondary | ICD-10-CM | POA: Insufficient documentation

## 2019-08-23 DIAGNOSIS — Z8572 Personal history of non-Hodgkin lymphomas: Secondary | ICD-10-CM | POA: Insufficient documentation

## 2019-08-23 DIAGNOSIS — J343 Hypertrophy of nasal turbinates: Secondary | ICD-10-CM | POA: Diagnosis not present

## 2019-08-23 DIAGNOSIS — Z809 Family history of malignant neoplasm, unspecified: Secondary | ICD-10-CM | POA: Insufficient documentation

## 2019-08-23 DIAGNOSIS — I10 Essential (primary) hypertension: Secondary | ICD-10-CM | POA: Diagnosis not present

## 2019-08-23 HISTORY — PX: SEPTOPLASTY: SHX2393

## 2019-08-23 HISTORY — DX: Migraine with aura, not intractable, without status migrainosus: G43.109

## 2019-08-23 HISTORY — DX: Other complications of anesthesia, initial encounter: T88.59XA

## 2019-08-23 HISTORY — DX: Family history of other specified conditions: Z84.89

## 2019-08-23 HISTORY — PX: TURBINATE REDUCTION: SHX6157

## 2019-08-23 HISTORY — DX: Sleep apnea, unspecified: G47.30

## 2019-08-23 SURGERY — SEPTOPLASTY, NOSE
Anesthesia: General | Laterality: Bilateral

## 2019-08-23 MED ORDER — CEFAZOLIN SODIUM-DEXTROSE 2-4 GM/100ML-% IV SOLN
2.0000 g | Freq: Once | INTRAVENOUS | Status: AC
  Administered 2019-08-23: 10:00:00 2 g via INTRAVENOUS

## 2019-08-23 MED ORDER — LIDOCAINE-EPINEPHRINE 1 %-1:100000 IJ SOLN
INTRAMUSCULAR | Status: DC | PRN
  Administered 2019-08-23: 3 mL

## 2019-08-23 MED ORDER — PHENYLEPHRINE HCL 0.5 % NA SOLN
NASAL | Status: DC | PRN
  Administered 2019-08-23: 11:00:00 30 mL via NASAL

## 2019-08-23 MED ORDER — DEXAMETHASONE SODIUM PHOSPHATE 4 MG/ML IJ SOLN
INTRAMUSCULAR | Status: DC | PRN
  Administered 2019-08-23: 10 mg via INTRAVENOUS

## 2019-08-23 MED ORDER — HYDROCODONE-ACETAMINOPHEN 5-325 MG PO TABS: 1.0000 | ORAL_TABLET | ORAL | 0 refills | Status: AC | PRN

## 2019-08-23 MED ORDER — OXYMETAZOLINE HCL 0.05 % NA SOLN
2.0000 | Freq: Once | NASAL | Status: AC
  Administered 2019-08-23: 10:00:00 2 via NASAL

## 2019-08-23 MED ORDER — EPHEDRINE SULFATE 50 MG/ML IJ SOLN
INTRAMUSCULAR | Status: DC | PRN
  Administered 2019-08-23 (×4): 10 mg via INTRAVENOUS

## 2019-08-23 MED ORDER — PREDNISONE 10 MG PO TABS: ORAL_TABLET | ORAL | 0 refills | Status: DC

## 2019-08-23 MED ORDER — FENTANYL CITRATE (PF) 100 MCG/2ML IJ SOLN: 25.0000 ug | INTRAMUSCULAR | Status: DC | PRN

## 2019-08-23 MED ORDER — PROPOFOL 10 MG/ML IV BOLUS
INTRAVENOUS | Status: DC | PRN
  Administered 2019-08-23: 150 mg via INTRAVENOUS

## 2019-08-23 MED ORDER — ACETAMINOPHEN 10 MG/ML IV SOLN
1000.0000 mg | Freq: Once | INTRAVENOUS | Status: AC
  Administered 2019-08-23: 1000 mg via INTRAVENOUS

## 2019-08-23 MED ORDER — MIDAZOLAM HCL 5 MG/5ML IJ SOLN
INTRAMUSCULAR | Status: DC | PRN
  Administered 2019-08-23: 2 mg via INTRAVENOUS

## 2019-08-23 MED ORDER — ONDANSETRON HCL 4 MG/2ML IJ SOLN
4.0000 mg | Freq: Once | INTRAMUSCULAR | Status: AC | PRN
  Administered 2019-08-23: 4 mg via INTRAVENOUS

## 2019-08-23 MED ORDER — OXYCODONE HCL 5 MG/5ML PO SOLN: 5.0000 mg | Freq: Once | ORAL | Status: AC | PRN

## 2019-08-23 MED ORDER — ONDANSETRON HCL 4 MG/2ML IJ SOLN
INTRAMUSCULAR | Status: DC | PRN
  Administered 2019-08-23: 4 mg via INTRAVENOUS

## 2019-08-23 MED ORDER — SCOPOLAMINE 1 MG/3DAYS TD PT72
1.0000 | MEDICATED_PATCH | Freq: Once | TRANSDERMAL | Status: DC
  Administered 2019-08-23: 1.5 mg via TRANSDERMAL

## 2019-08-23 MED ORDER — LACTATED RINGERS IV SOLN
INTRAVENOUS | Status: DC | PRN
  Administered 2019-08-23: 10:00:00 via INTRAVENOUS

## 2019-08-23 MED ORDER — LIDOCAINE HCL (CARDIAC) PF 100 MG/5ML IV SOSY
PREFILLED_SYRINGE | INTRAVENOUS | Status: DC | PRN
  Administered 2019-08-23: 50 mg via INTRAVENOUS

## 2019-08-23 MED ORDER — GLYCOPYRROLATE 0.2 MG/ML IJ SOLN
INTRAMUSCULAR | Status: DC | PRN
  Administered 2019-08-23: 0.1 mg via INTRAVENOUS

## 2019-08-23 MED ORDER — FENTANYL CITRATE (PF) 100 MCG/2ML IJ SOLN
INTRAMUSCULAR | Status: DC | PRN
  Administered 2019-08-23: 100 ug via INTRAVENOUS

## 2019-08-23 MED ORDER — ROCURONIUM BROMIDE 100 MG/10ML IV SOLN
INTRAVENOUS | Status: DC | PRN
  Administered 2019-08-23: 20 mg via INTRAVENOUS

## 2019-08-23 MED ORDER — OXYCODONE HCL 5 MG PO TABS
5.0000 mg | ORAL_TABLET | Freq: Once | ORAL | Status: AC | PRN
  Administered 2019-08-23: 5 mg via ORAL

## 2019-08-23 MED ORDER — CEPHALEXIN 500 MG PO CAPS: 500.0000 mg | ORAL_CAPSULE | Freq: Two times a day (BID) | ORAL | 0 refills | Status: DC

## 2019-08-23 SURGICAL SUPPLY — 26 items
CANISTER SUCT 1200ML W/VALVE (MISCELLANEOUS) ×3 IMPLANT
COAGULATOR SUCT 8FR VV (MISCELLANEOUS) ×3 IMPLANT
ELECT REM PT RETURN 9FT ADLT (ELECTROSURGICAL) ×3
ELECTRODE REM PT RTRN 9FT ADLT (ELECTROSURGICAL) ×1 IMPLANT
GLOVE PI ULTRA LF STRL 7.5 (GLOVE) ×2 IMPLANT
GLOVE PI ULTRA NON LATEX 7.5 (GLOVE) ×4
GOWN STRL REUS W/ TWL LRG LVL3 (GOWN DISPOSABLE) ×1 IMPLANT
GOWN STRL REUS W/TWL LRG LVL3 (GOWN DISPOSABLE) ×3
KIT TURNOVER KIT A (KITS) ×3 IMPLANT
NDL ANESTHESIA 27G X 3.5 (NEEDLE) ×1 IMPLANT
NDL HYPO 27GX1-1/4 (NEEDLE) ×1 IMPLANT
NEEDLE ANESTHESIA  27G X 3.5 (NEEDLE) ×2
NEEDLE ANESTHESIA 27G X 3.5 (NEEDLE) ×1 IMPLANT
NEEDLE HYPO 27GX1-1/4 (NEEDLE) ×3 IMPLANT
PACK ENT CUSTOM (PACKS) ×3 IMPLANT
PATTIES SURGICAL .5 X3 (DISPOSABLE) ×3 IMPLANT
SPLINT NASAL SEPTAL BLV .50 ST (MISCELLANEOUS) ×3 IMPLANT
STRAP BODY AND KNEE 60X3 (MISCELLANEOUS) ×3 IMPLANT
SUT CHROMIC 3-0 (SUTURE) ×3
SUT CHROMIC 3-0 KS 27XMFL CR (SUTURE) ×1
SUT ETHILON 3-0 KS 30 BLK (SUTURE) ×3 IMPLANT
SUT PLAIN GUT 4-0 (SUTURE) ×3 IMPLANT
SUTURE CHRMC 3-0 KS 27XMFL CR (SUTURE) IMPLANT
SYR 3ML LL SCALE MARK (SYRINGE) ×3 IMPLANT
TOWEL OR 17X26 4PK STRL BLUE (TOWEL DISPOSABLE) ×3 IMPLANT
WATER STERILE IRR 250ML POUR (IV SOLUTION) ×3 IMPLANT

## 2019-08-23 NOTE — Anesthesia Postprocedure Evaluation (Signed)
Anesthesia Post Note  Patient: Brian Atkins  Procedure(s) Performed: SEPTOPLASTY (Bilateral ) TURBINATE REDUCTION (Bilateral )  Patient location during evaluation: PACU Anesthesia Type: General Level of consciousness: awake and alert and oriented Pain management: satisfactory to patient Vital Signs Assessment: post-procedure vital signs reviewed and stable Respiratory status: spontaneous breathing, nonlabored ventilation and respiratory function stable Cardiovascular status: blood pressure returned to baseline and stable Postop Assessment: Adequate PO intake and No signs of nausea or vomiting Anesthetic complications: no    Raliegh Ip

## 2019-08-23 NOTE — Discharge Instructions (Signed)
[facility] °[facility] °ENDOSCOPIC SINUS SURGERY °[facility] ° °What is Functional Endoscopic Sinus Surgery? ° The Surgery involves making the natural openings of the sinuses larger by removing the bony partitions that separate the sinuses from the nasal cavity.  The natural sinus lining is preserved as much as possible to allow the sinuses to resume normal function after the surgery.  In some patients nasal polyps (excessively swollen lining of the sinuses) may be removed to relieve obstruction of the sinus openings.  The surgery is performed through the nose using lighted scopes, which eliminates the need for incisions on the face.  A septoplasty is a different procedure which is sometimes performed with sinus surgery.  It involves straightening the boy partition that separates the two sides of your nose.  A crooked or deviated septum may need repair if is obstructing the sinuses or nasal airflow.  Turbinate reduction is also often performed during sinus surgery.  The turbinates are bony proturberances from the side walls of the nose which swell and can obstruct the nose in patients with sinus and allergy problems.  Their size can be surgically reduced to help relieve nasal obstruction. ° °What Can Sinus Surgery Do For Me? ° Sinus surgery can reduce the frequency of sinus infections requiring antibiotic treatment.  This can provide improvement in nasal congestion, post-nasal drainage, facial pressure and nasal obstruction.  Surgery will NOT prevent you from ever having an infection again, so it usually only for patients who get infections 4 or more times yearly requiring antibiotics, or for infections that do not clear with antibiotics.  It will not cure nasal allergies, so patients with allergies may still require medication to treat their allergies after surgery. Surgery may improve headaches related to sinusitis, however, some people will continue to  require medication to control sinus headaches related to allergies.  Surgery will do nothing for other forms of headache (migraine, tension or cluster). ° °What Are the Risks of Endoscopic Sinus Surgery? ° Current techniques allow surgery to be performed safely with little risk, however, there are rare complications that patients should be aware of.  Because the sinuses are located around the eyes, there is risk of eye injury, including blindness, though again, this would be quite rare. This is usually a result of bleeding behind the eye during surgery, which puts the vision oat risk, though there are treatments to protect the vision and prevent permanent disrupted by surgery causing a leak of the spinal fluid that surrounds the brain.  More serious complications would include bleeding inside the brain cavity or damage to the brain.  Again, all of these complications are uncommon, and spinal fluid leaks can be safely managed surgically if they occur.  The most common complication of sinus surgery is bleeding from the nose, which may require packing or cauterization of the nose.  Continued sinus have polyps may experience recurrence of the polyps requiring revision surgery.  Alterations of sense of smell or injury to the tear ducts are also rare complications.  ° °What is the Surgery Like, and what is the Recovery? ° The Surgery usually takes a couple of hours to perform, and is usually performed under a general anesthetic (completely asleep).  Patients are usually discharged home after a couple of hours.  Sometimes during surgery it is necessary to pack the nose to control bleeding, and the packing is left in place for 24 - 48 hours, and removed by your surgeon.    If a septoplasty was performed during the procedure, there is often a splint placed which must be removed after 5-7 days.   °Discomfort: Pain is usually mild to moderate, and can be controlled by prescription pain medication or acetaminophen (Tylenol).   Aspirin, Ibuprofen (Advil, Motrin), or Naprosyn (Aleve) should be avoided, as they can cause increased bleeding.  Most patients feel sinus pressure like they have a bad head cold for several days.  Sleeping with your head elevated can help reduce swelling and facial pressure, as can ice packs over the face.  A humidifier may be helpful to keep the mucous and blood from drying in the nose.  ° °Diet: There are no specific diet restrictions, however, you should generally start with clear liquids and a light diet of bland foods because the anesthetic can cause some nausea.  Advance your diet depending on how your stomach feels.  Taking your pain medication with food will often help reduce stomach upset which pain medications can cause. ° °Nasal Saline Irrigation: It is important to remove blood clots and dried mucous from the nose as it is healing.  This is done by having you irrigate the nose at least 3 - 4 times daily with a salt water solution.  We recommend using NeilMed Sinus Rinse (available at the drug store).  Fill the squeeze bottle with the solution, bend over a sink, and insert the tip of the squeeze bottle into the nose ½ of an inch.  Point the tip of the squeeze bottle towards the inside corner of the eye on the same side your irrigating.  Squeeze the bottle and gently irrigate the nose.  If you bend forward as you do this, most of the fluid will flow back out of the nose, instead of down your throat.   The solution should be warm, near body temperature, when you irrigate.   Each time you irrigate, you should use a full squeeze bottle.  ° °Note that if you are instructed to use Nasal Steroid Sprays at any time after your surgery, irrigate with saline BEFORE using the steroid spray, so you do not wash it all out of the nose. °Another product, Nasal Saline Gel (such as AYR Nasal Saline Gel) can be applied in each nostril 3 - 4 times daily to moisture the nose and reduce scabbing or crusting. ° °Bleeding:   Bloody drainage from the nose can be expected for several days, and patients are instructed to irrigate their nose frequently with salt water to help remove mucous and blood clots.  The drainage may be dark red or brown, though some fresh blood may be seen intermittently, especially after irrigation.  Do not blow you nose, as bleeding may occur. If you must sneeze, keep your mouth open to allow air to escape through your mouth. ° °If heavy bleeding occurs: Irrigate the nose with saline to rinse out clots, then spray the nose 3 - 4 times with Afrin Nasal Decongestant Spray.  The spray will constrict the blood vessels to slow bleeding.  Pinch the lower half of your nose shut to apply pressure, and lay down with your head elevated.  Ice packs over the nose may help as well. If bleeding persists despite these measures, you should notify your doctor.  Do not use the Afrin routinely to control nasal congestion after surgery, as it can result in worsening congestion and may affect healing.  ° ° ° °Activity: Return to work varies among patients. Most patients will be   out of work at least 5 - 7 days to recover.  Patient may return to work after they are off of narcotic pain medication, and feeling well enough to perform the functions of their job.  Patients must avoid heavy lifting (over 10 pounds) or strenuous physical for 2 weeks after surgery, so your employer may need to assign you to light duty, or keep you out of work longer if light duty is not possible.  NOTE: you should not drive, operate dangerous machinery, do any mentally demanding tasks or make any important legal or financial decisions while on narcotic pain medication and recovering from the general anesthetic.  °  °Call Your Doctor Immediately if You Have Any of the Following: °1. Bleeding that you cannot control with the above measures °2. Loss of vision, double vision, bulging of the eye or black eyes. °3. Fever over 101 degrees °4. Neck stiffness with  severe headache, fever, nausea and change in mental state. °You are always encourage to call anytime with concerns, however, please call with requests for pain medication refills during office hours. ° °Office Endoscopy: During follow-up visits your doctor will remove any packing or splints that may have been placed and evaluate and clean your sinuses endoscopically.  Topical anesthetic will be used to make this as comfortable as possible, though you may want to take your pain medication prior to the visit.  How often this will need to be done varies from patient to patient.  After complete recovery from the surgery, you may need follow-up endoscopy from time to time, particularly if there is concern of recurrent infection or nasal polyps. ° ° °General Anesthesia, Adult, Care After °This sheet gives you information about how to care for yourself after your procedure. Your health care provider may also give you more specific instructions. If you have problems or questions, contact your health care provider. °What can I expect after the procedure? °After the procedure, the following side effects are common: °· Pain or discomfort at the IV site. °· Nausea. °· Vomiting. °· Sore throat. °· Trouble concentrating. °· Feeling cold or chills. °· Weak or tired. °· Sleepiness and fatigue. °· Soreness and body aches. These side effects can affect parts of the body that were not involved in surgery. °Follow these instructions at home: ° °For at least 24 hours after the procedure: °· Have a responsible adult stay with you. It is important to have someone help care for you until you are awake and alert. °· Rest as needed. °· Do not: °? Participate in activities in which you could fall or become injured. °? Drive. °? Use heavy machinery. °? Drink alcohol. °? Take sleeping pills or medicines that cause drowsiness. °? Make important decisions or sign legal documents. °? Take care of children on your own. °Eating and  drinking °· Follow any instructions from your health care provider about eating or drinking restrictions. °· When you feel hungry, start by eating small amounts of foods that are soft and easy to digest (bland), such as toast. Gradually return to your regular diet. °· Drink enough fluid to keep your urine pale yellow. °· If you vomit, rehydrate by drinking water, juice, or clear broth. °General instructions °· If you have sleep apnea, surgery and certain medicines can increase your risk for breathing problems. Follow instructions from your health care provider about wearing your sleep device: °? Anytime you are sleeping, including during daytime naps. °? While taking prescription pain medicines, sleeping medicines, or medicines   that make you drowsy. °· Return to your normal activities as told by your health care provider. Ask your health care provider what activities are safe for you. °· Take over-the-counter and prescription medicines only as told by your health care provider. °· If you smoke, do not smoke without supervision. °· Keep all follow-up visits as told by your health care provider. This is important. °Contact a health care provider if: °· You have nausea or vomiting that does not get better with medicine. °· You cannot eat or drink without vomiting. °· You have pain that does not get better with medicine. °· You are unable to pass urine. °· You develop a skin rash. °· You have a fever. °· You have redness around your IV site that gets worse. °Get help right away if: °· You have difficulty breathing. °· You have chest pain. °· You have blood in your urine or stool, or you vomit blood. °Summary °· After the procedure, it is common to have a sore throat or nausea. It is also common to feel tired. °· Have a responsible adult stay with you for the first 24 hours after general anesthesia. It is important to have someone help care for you until you are awake and alert. °· When you feel hungry, start by eating  small amounts of foods that are soft and easy to digest (bland), such as toast. Gradually return to your regular diet. °· Drink enough fluid to keep your urine pale yellow. °· Return to your normal activities as told by your health care provider. Ask your health care provider what activities are safe for you. °This information is not intended to replace advice given to you by your health care provider. Make sure you discuss any questions you have with your health care provider. °Document Released: 03/14/2001 Document Revised: 12/09/2017 Document Reviewed: 07/22/2017 °Elsevier Patient Education © 2020 [employer] ° °

## 2019-08-23 NOTE — H&P (Signed)
H&P has been reviewed and patient reevaluated, no changes necessary. To be downloaded later.  

## 2019-08-23 NOTE — Transfer of Care (Signed)
Immediate Anesthesia Transfer of Care Note  Patient: Brian Atkins  Procedure(s) Performed: SEPTOPLASTY (Bilateral ) TURBINATE REDUCTION (Bilateral )  Patient Location: PACU  Anesthesia Type: General ETT  Level of Consciousness: awake, alert  and patient cooperative  Airway and Oxygen Therapy: Patient Spontanous Breathing and Patient connected to supplemental oxygen  Post-op Assessment: Post-op Vital signs reviewed, Patient's Cardiovascular Status Stable, Respiratory Function Stable, Patent Airway and No signs of Nausea or vomiting  Post-op Vital Signs: Reviewed and stable  Complications: No apparent anesthesia complications

## 2019-08-23 NOTE — Op Note (Signed)
08/23/2019  11:37 AM  [id number]   Pre-Op Dx:  Deviated Nasal Septum, Hypertrophic Inferior Turbinates  Post-op Dx: Same  Proc: Nasal Septoplasty, Bilateral Partial Reduction Inferior Turbinates   Surg:  Elon Alas Amandy Chubbuck  Anes:  GOT  EBL: 50 mL  Comp: None  Findings: Septum deviated to the left side superiorly and a large inferior spur on the left side.  The inferior turbinates were enlarged on both sides.  Procedure: With the patient in a comfortable supine position,  general orotracheal anesthesia was induced without difficulty.     The patient received preoperative Afrin spray for topical decongestion and vasoconstriction.  Intravenous prophylactic antibiotics were administered.  At an appropriate level, the patient was placed in a semi-sitting position.  Nasal vibrissae were trimmed.   1% Xylocaine with 1:100,000 epinephrine, 6 cc's, was infiltrated into the anterior floor of the nose, into the nasal spine region, into the membranous columella, and finally into the submucoperichondrial plane of the septum on both sides.  Several minutes were allowed for this to take effect.  Cottoniod pledgetts soaked in Afrin and 4% Xylocaine were placed into both nasal cavities and left while the patient was prepped and draped in the standard fashion.  The materials were removed from the nose and observed to be intact and correct in number.  The nose was inspected with a headlight and zero degree scope with the findings as described above.  A left Killian incision was sharply executed and carried down to the quadrangular cartilage. The mucoperichondrium was elelvated along the quadrangular plate back to the bony-cartilaginous junction. The mucoperiostium was then elevated along the ethmoid plate and the vomer. The boney-catilaginous junction was then split with a freer elevator and the mucoperiosteum was elevated on the opposite side. The mucoperiosteum was then elevated along the maxillary crest as  needed to expose the crooked bone of the crest.  Boney spurs of the vomer and maxillary crest were removed with Donavan Foil forceps.  The cartilaginous plate was trimmed along its posterior and inferior borders of about 2 mm of cartilage to free it up inferiorly. Some of the deviated ethmoid plate was then fractured and removed with Takahashi forceps to free up the posterior border of the quadrangular plate and allow it to swing back to the midline. The mucosal flaps were placed back into their anatomic position to allow visualization of the airways. The septum now sat in the midline with an improved airway.  A 3-0 Chromic suture on a Keith needle in used to anchor the inferior septum at the nasal spine with a through and through suture. The mucosal flaps are then sutured together using a through and through whip stitch of 4-0 Plain Gut with a mini-Keith needle. This was used to close the [place] incision as well.   The inferior turbinates were then inspected. An incision was created along the inferior aspect of the left inferior turbinate with removal of some of the inferior soft tissue and bone. Electrocautery was used to control bleeding in the area. The remaining turbinate was then outfractured to open up the airway further. There was no significant bleeding noted. The right turbinate was then trimmed and outfractured in a similar fashion.  The airways were then visualized and showed open passageways on both sides that were significantly improved compared to before surgery. There was no signifcant bleeding. Nasal splints were applied to both sides of the septum using Xomed 0.81mm regular sized splints that were trimmed, and then held in position with  a 3-0 Nylon through and through suture.  The patient was turned back over to anesthesia, and awakened, extubated, and taken to the PACU in satisfactory condition.  Dispo:   PACU to home  Plan: Ice, elevation, narcotic analgesia, steroid taper, and  prophylactic antibiotics for the duration of indwelling nasal foreign bodies.  We will reevaluate the patient in the office in 6 days and remove the septal splints.  Return to work in 10 days, strenuous activities in two weeks.   Elon Alas Kimsey Demaree 08/23/2019 11:37 AM

## 2019-08-23 NOTE — Anesthesia Procedure Notes (Signed)
Procedure Name: Intubation Date/Time: 08/23/2019 10:34 AM Performed by: Mayme Genta, CRNA Pre-anesthesia Checklist: Patient identified, Emergency Drugs available, Suction available, Patient being monitored and Timeout performed Patient Re-evaluated:Patient Re-evaluated prior to induction Oxygen Delivery Method: Circle system utilized Preoxygenation: Pre-oxygenation with 100% oxygen Induction Type: IV induction Ventilation: Mask ventilation without difficulty Laryngoscope Size: Miller and 3 Grade View: Grade I Tube type: Oral Rae Tube size: 7.5 mm Number of attempts: 1 Placement Confirmation: ETT inserted through vocal cords under direct vision,  positive ETCO2 and breath sounds checked- equal and bilateral Tube secured with: Tape Dental Injury: Teeth and Oropharynx as per pre-operative assessment

## 2019-08-23 NOTE — Telephone Encounter (Signed)
Clearance form signed by [name], PA for [facility].  Faxed back to [phone]. Confirmation received.

## 2019-11-19 ENCOUNTER — Other Ambulatory Visit: Payer: Self-pay | Admitting: Physician Assistant

## 2019-11-21 ENCOUNTER — Ambulatory Visit: Payer: 59 | Admitting: Internal Medicine

## 2019-11-21 NOTE — Progress Notes (Deleted)
Follow-up Outpatient Visit Date: 11/21/2019  Primary Care Provider: [name], MD [address] [place] [address]  Chief Complaint: ***  HPI:  Mr. Neuburger is a 63 y.o. male with history of non-obstructive coronary artery disease by catheterization in 11/2017, hypertension, marginal zone B-cell lymphoma, and GERD status post Nissen fundoplication, who presents for follow-up of nonobstructive CAD and hypertension.  He was last seen in our office for preoperative cardiovascular risk assessment in anticipation of septoplasty in early September by [name], PA.  At that time, he was doing well from a heart standpoint.  Blood pressures were upper normal to mildly elevated, for which he was using as needed amlodipine.  He was maintained on current doses of losartan and carvedilol.  No further testing was recommended.  --------------------------------------------------------------------------------------------------  Cardiovascular History & Procedures: Cardiovascular Problems:  Palpitations  Atypical chest pain with questionable history of heart attack while receiving chemotherapy  Shortness of breath  Risk Factors:  Known CAD, hypertension, and male gender  Cath/PCI:  LHC (12/07/17): LMCA normal.  LAD with 20% ostial and 60% mid vessel disease (FFR 0.84).  Small ramus branch with 50% ostial stenosis.  LCx normal.  RCA with 50% mid vessel disease (FFR 0.94).  CV Surgery:  None  EP Procedures and Devices:  None  Non-Invasive Evaluation(s):  TTE (12/29/17): Normal LV size with mild LVH.  LVEF 60-65% with normal wall motion.  Mild left atrial enlargement.  Exercise stress echocardiogram (02/26/15, [facility]): Normal exercise stress echocardiogram with LVEF greater than 55% and no wall motion abnormalities.  Recent CV Pertinent Labs: Lab Results  Component Value Date   CHOL 212 (H) 03/02/2013   HDL 27.70 (L) 03/02/2013   LDLCALC 83 10/25/2011   LDLDIRECT  135.8 03/02/2013   TRIG 168.0 (H) 03/02/2013   CHOLHDL 8 03/02/2013   INR 1.0 11/30/2017   K 4.0 03/13/2019   K 3.9 03/28/2014   MG 2.1 07/20/2018   MG 2.0 03/28/2014   BUN 17 03/13/2019   BUN 14 11/30/2017   BUN 12 03/28/2014   CREATININE 1.02 03/13/2019   CREATININE 1.06 07/25/2017    Past medical and surgical history were reviewed and updated in EPIC.  No outpatient medications have been marked as taking for the 11/21/19 encounter (Appointment) with Marvin Grabill, Harrell Gave, MD.    Allergies: Avelox [moxifloxacin], Contrast media [iodinated diagnostic agents], Crestor [rosuvastatin calcium], Morphine and related, Niacin-simvastatin er, Latex, and Tape  Social History   Tobacco Use  . Smoking status: Never Smoker  . Smokeless tobacco: Never Used  Substance Use Topics  . Alcohol use: No  . Drug use: No    Family History  Problem Relation Age of Onset  . Coronary artery disease Father 38       CABG  . Aortic aneurysm Father 39       repaired  . Hyperlipidemia Father   . Lung cancer Mother   . Hypertension Mother   . Cancer Mother        bladder  . COPD Mother   . Stomach cancer Maternal Grandfather     Review of Systems: A 12-system review of systems was performed and was negative except as noted in the HPI.  --------------------------------------------------------------------------------------------------  Physical Exam: There were no vitals taken for this visit.  General:  *** HEENT: No conjunctival pallor or scleral icterus. Facemask in place. Neck: Supple without lymphadenopathy, thyromegaly, JVD, or HJR. Lungs: Normal work of breathing. Clear to auscultation bilaterally without wheezes or crackles.  Heart: Regular rate and rhythm without murmurs, rubs, or gallops. Non-displaced PMI. Abd: Bowel sounds present. Soft, NT/ND without hepatosplenomegaly Ext: No lower extremity edema. Radial, PT, and DP pulses are 2+ bilaterally. Skin: Warm and dry without rash.   EKG:  ***  Lab Results  Component Value Date   WBC 7.2 07/20/2018   HGB 15.2 07/20/2018   HCT 44.7 07/20/2018   MCV 90.7 07/20/2018   PLT 420.0 (H) 07/20/2018    Lab Results  Component Value Date   NA 139 03/13/2019   K 4.0 03/13/2019   CL 105 03/13/2019   CO2 27 03/13/2019   BUN 17 03/13/2019   CREATININE 1.02 03/13/2019   GLUCOSE 97 03/13/2019   ALT 15 03/13/2019    Lab Results  Component Value Date   CHOL 212 (H) 03/02/2013   HDL 27.70 (L) 03/02/2013   LDLCALC 83 10/25/2011   LDLDIRECT 135.8 03/02/2013   TRIG 168.0 (H) 03/02/2013   CHOLHDL 8 03/02/2013    --------------------------------------------------------------------------------------------------  ASSESSMENT AND PLAN: Nelva Bush, MD 11/21/2019 12:45 PM

## 2019-11-26 ENCOUNTER — Encounter: Payer: Self-pay | Admitting: Physician Assistant

## 2019-11-26 ENCOUNTER — Ambulatory Visit (INDEPENDENT_AMBULATORY_CARE_PROVIDER_SITE_OTHER): Payer: 59 | Admitting: Physician Assistant

## 2019-11-26 ENCOUNTER — Other Ambulatory Visit: Payer: Self-pay

## 2019-11-26 ENCOUNTER — Telehealth: Payer: Self-pay | Admitting: Internal Medicine

## 2019-11-26 VITALS — BP 150/100 | HR 72 | Temp 97.2°F | Ht 74.0 in | Wt 208.0 lb

## 2019-11-26 DIAGNOSIS — Z5181 Encounter for therapeutic drug level monitoring: Secondary | ICD-10-CM

## 2019-11-26 DIAGNOSIS — R072 Precordial pain: Secondary | ICD-10-CM | POA: Diagnosis not present

## 2019-11-26 DIAGNOSIS — Z79899 Other long term (current) drug therapy: Secondary | ICD-10-CM

## 2019-11-26 DIAGNOSIS — I2510 Atherosclerotic heart disease of native coronary artery without angina pectoris: Secondary | ICD-10-CM

## 2019-11-26 DIAGNOSIS — R079 Chest pain, unspecified: Secondary | ICD-10-CM

## 2019-11-26 DIAGNOSIS — I10 Essential (primary) hypertension: Secondary | ICD-10-CM

## 2019-11-26 DIAGNOSIS — E785 Hyperlipidemia, unspecified: Secondary | ICD-10-CM

## 2019-11-26 MED ORDER — PREDNISONE 50 MG PO TABS: ORAL_TABLET | ORAL | 0 refills | Status: DC

## 2019-11-26 MED ORDER — METOPROLOL TARTRATE 100 MG PO TABS: ORAL_TABLET | ORAL | 0 refills | Status: DC

## 2019-11-26 MED ORDER — AMLODIPINE BESYLATE 10 MG PO TABS: 10.0000 mg | ORAL_TABLET | Freq: Every day | ORAL | 2 refills | Status: DC

## 2019-11-26 MED ORDER — DIPHENHYDRAMINE HCL 50 MG PO TABS: ORAL_TABLET | ORAL | 0 refills | Status: DC

## 2019-11-26 NOTE — Telephone Encounter (Signed)
Call to patient to discuss BP complaint and "chest discomfort". He reports that BP has been elevated for 3-4 days and having nausea that is improved with antacids.   Pt spoke to MD on call over the weekend and made med adjustments as advised.   Pt denies chest discomfort at this time.   He is agreeable to be seen in the office this afternoon with [name], PA.   Advised pt to call for any further questions or concerns.  Pt advised to seek urgent medical care for new or worsening sx.

## 2019-11-26 NOTE — Telephone Encounter (Signed)
New Message  Pt c/o BP issue: STAT if pt c/o blurred vision, one-sided weakness or slurred speech  1. What are your last 5 BP readings? 171/108, 154/106, 178/115,   2. Are you having any other symptoms (ex. Dizziness, headache, blurred vision, passed out)? Dizziness, loss of appetite, chest tightness, headaches,   3. What is your BP issue? BP elevated

## 2019-11-26 NOTE — Progress Notes (Signed)
Cardiology Office Note    Date:  11/26/2019   ID:  Brian Atkins, Brian Atkins September 24, 1956, MRN [id number]  PCP:  [name], MD  Cardiologist:  Nelva Bush, MD  Electrophysiologist:  None   Chief Complaint: Elevated BP/chest discomfort  History of Present Illness:   Brian Atkins is a 63 y.o. male with history of nonobstructive CAD by [place] in 11/2017, HTN, marginal zone B-cell lymphoma, headache disorder, diverticulitis, and GERD status post Nissen fundoplication who presents for evaluation of elevated BP and chest discomfort.  Patient was seen by Dr.End in 11/2017 with elevated blood pressure, palpitations, headaches, and chest pain. He subsequently underwent LHC which revealed moderate, nonobstructive CAD involving the proximal/mid LAD and mid RCA. Both lesions were not hemodynamically significant by FFR (FFR of LAD 0.84, FFR RCA 0.94). LVEF greater than 65%, moderately elevated LVEDP consistent with diastolic dysfunction. Medical therapy and risk factor modification was recommended. TTE 12/29/17 showed an EF of 60-65%, normal wall motion, normal LV diastolic function, mildly dilated LA.At the patient's follow-up on 01/04/2018, he felt like he was breathing a little bit better. However, he continued to have occasional chest tightness that seemedto correspond with his headaches and elevated BP readings. His home blood pressure readings were somewhat labile and typically elevated up to 165/110 in the setting of his headaches. His headaches were noted to be frequently accompanied by vision changes including floaters and flashes. The patient was concerned that Lipitor may have been contributing to his headaches, thus he had self discontinued Lipitor prior to his office visit in 12/2017. Following the self discontinuation of Lipitor he did not notice any change in his headaches. At that time, the patient had never been evaluated by a neurologist. Prior MRI of the brain (preceding cardiac  catheterization) did not show any significant intracranial abnormalities. Given the patient's son's history of adrenal tumor it was recommended that the patient be excluded for secondary causes of hypertension including pheochromocytoma and hyperaldosteronism. Catecholamines/metanephrines showed no evidence of pheochromocytoma. Aldosterone/renin activity ratio showed a minimally elevated aldosterone/plasma renin activity ratio, though with a serum aldosterone level that was within normal limits. He was started on spironolactone in 01/2018. Follow-up labs in 02/2018 showed a mild elevation in his serum creatinine and potassium at goal. It was recommended he change his Lasix to as needed dosing. Patient saw PCP in late 02/2018 noting paresthesias of the bilateral upper extremities. B12 was low. C-spine plain films showed DDD.MRI of the cervical spine in 03/2018 was nonacute.  He was seen by cardiology in 04/2018 for routine follow-up and was doing well from a cardiac perspective.  He did note some palpitations though declined outpatient cardiac monitoring.  Given mildly elevated blood pressure hydralazine was added to his regimen.   He was most recently seen on 08/21/2019 for preoperative cardiac evaluation and doing well from a cardiac perspective.  He subsequently underwent septoplasty on 9/3 without cardiac complication.  Patient contacted our office this morning noting elevated BP readings ranging from 154-178/106-115 with associated dizziness, loss of appetite, nausea that improved with antacids, chest tightness, and headaches that have been present for the prior 3 to 4 days.  Patient indicated he spoke with on-call physician and was advised to take 5 mg of amlodipine as opposed to 2.5 mg  Patient comes in noting for the past approximate 4 weeks he has had intermittent chest discomfort that feels similar to his presentation in 2018.  He has described this as a burning sensation that does  not completely  go away with his prescribed Dexilant or addition of Tums.  Discomfort is not always associated with exertion.  There has been some associated nausea.  He does not describe this as a pain.  There has been no associated shortness of breath, palpitations, dizziness, presyncope, syncope, or radiation.  He is concerned with the indigestion component of his symptoms as his father was complaining of indigestion and subsequently ended up with an MI.  More recently, over the past 4 to 5 days he has noted an increase in fatigue, headaches, floaters in his vision, and chest discomfort.  He was not checking his BP at that time though did check this on 12/6 and noted his readings were in the [id number] systolic prompting him to call the on-call service as above in which she was advised to take an additional 2.5 mg of amlodipine that evening.  With this, he did note some improvement in BP into the [id number] systolic.  He is followed by neurology for his headache disorder and vision floaters.  Indicates he has been prescribed Emgality with a slight improvement in his headaches.  He is currently chest pain/discomfort-free.   Labs: 09/2019 - TSH normal 02/2019 - A1c 5.9, potassium 4.0, serum creatinine 1.02, albumin 4.2, AST/ALT normal 07/2018 - magnesium 2.1, WBC 7.2, Hgb 15.2, PLT 420  Past Medical History:  Diagnosis Date   Complication of anesthesia    slow to wake   Coronary artery disease, non-occlusive    a. LHC 12/18: ostLAD 20%, p/mLAD 60% FFR 0.84, ost ramus 50%, mRCA 50% FFR 0.94, EF 65%   Diverticulitis    Diverticulosis    Dyslipidemia    Family history of adverse reaction to anesthesia    Mother - slow to wake   GERD (gastroesophageal reflux disease)    Hemorrhoids    Hiatal hernia    History of echocardiogram    a. TTE 1/19: EF of 60-65%, normal wall motion, normal LV diastolic function, mildly dilated LA   Labile hypertension    Lymphoma (HCC)    Medial epicondylitis of right elbow     Meralgia paresthetica of right side    Microscopic hematuria    Ocular migraine    Sleep apnea    CPAP    Past Surgical History:  Procedure Laterality Date   INTRAVASCULAR PRESSURE WIRE/FFR STUDY N/A 12/07/2017   Procedure: INTRAVASCULAR PRESSURE WIRE/FFR STUDY;  Surgeon: Nelva Bush, MD;  Location: [facility];  Service: Cardiovascular;  Laterality: N/A;   LEFT HEART CATH AND CORONARY ANGIOGRAPHY Left 12/07/2017   Procedure: LEFT HEART CATH AND CORONARY ANGIOGRAPHY;  Surgeon: Nelva Bush, MD;  Location: [facility];  Service: Cardiovascular;  Laterality: Left;   nissen funduplication  [id number]   [name]   SEPTOPLASTY Bilateral 08/23/2019   Procedure: SEPTOPLASTY;  Surgeon: [name], MD;  Location: [facility];  Service: ENT;  Laterality: Bilateral;   SPLENECTOMY     TURBINATE REDUCTION Bilateral 08/23/2019   Procedure: TURBINATE REDUCTION;  Surgeon: [name], MD;  Location: [facility];  Service: ENT;  Laterality: Bilateral;  Latex sensitivity sleep apnea    Current Medications: Current Meds  Medication Sig   ALPRAZolam (XANAX) 0.25 MG tablet Take 1 tablet (0.25 mg total) by mouth at bedtime as needed for anxiety.   amLODipine (NORVASC) 2.5 MG tablet TAKE 1 TABLET BY MOUTH EVERY DAY   atorvastatin (LIPITOR) 20 MG tablet Take 1 tablet (20 mg total) by mouth daily.  carvedilol (COREG) 25 MG tablet TAKE 1 TABLET BY MOUTH TWICE A DAY   cyanocobalamin (,VITAMIN B-12,) 1000 MCG/ML injection INJECT 1 ML INTO THE MUSCLE DAILY FOR 3 DAYS, THEN WEEKLY FOR 4 WEEKS , THEN MONTHLY THEREAFTER   dexlansoprazole (DEXILANT) 60 MG capsule Take 60 mg by mouth 2 (two) times daily.   LORazepam (ATIVAN) 1 MG tablet Take 1 tablet (1 mg total) by mouth every 8 (eight) hours as needed.   losartan (COZAAR) 100 MG tablet Take 1 tablet (100 mg total) by mouth daily.   Melatonin 3 MG TABS Take 10 mg by mouth at bedtime.    ondansetron  (ZOFRAN) 8 MG tablet Take 1 tablet (8 mg total) by mouth daily as needed. for nausea   ONETOUCH VERIO test strip USE TO CHECK BLOOD SUGAR AS NEEDED   rosuvastatin (CRESTOR) 5 MG tablet Take 1 tablet (5 mg total) once daily on m/w/f.   Syringe/Needle, Disp, (SYRINGE 3CC/25GX1") 25G X 1" 3 ML MISC Use for b12 injections   zolpidem (AMBIEN) 10 MG tablet Take 1 tablet (10 mg total) by mouth at bedtime as needed for sleep.    Allergies:   Avelox [moxifloxacin], Contrast media [iodinated diagnostic agents], Crestor [rosuvastatin calcium], Morphine and related, Niacin-simvastatin er, Latex, and Tape   Social History   Socioeconomic History   Marital status: Married    Spouse name: Not on file   Number of children: 2   Years of education: 14   Highest education level: Not on file  Occupational History   Occupation: Shop Printmaker: [employer]    Comment: Environmental manager strain: Not on file   Food insecurity    Worry: Not on file    Inability: Not on file   Transportation needs    Medical: Not on file    Non-medical: Not on file  Tobacco Use   Smoking status: Never Smoker   Smokeless tobacco: Never Used  Substance and Sexual Activity   Alcohol use: No   Drug use: No   Sexual activity: Yes  Lifestyle   Physical activity    Days per week: Not on file    Minutes per session: Not on file   Stress: Not on file  Relationships   Social connections    Talks on phone: Not on file    Gets together: Not on file    Attends religious service: Not on file    Active member of club or organization: Not on file    Attends meetings of clubs or organizations: Not on file    Relationship status: Not on file  Other Topics Concern   Not on file  Social History Narrative   Not on file     Family History:  The patient's family history includes Aortic aneurysm (age of onset: 41) in his father; COPD in his mother; Cancer in his  mother; Coronary artery disease (age of onset: 27) in his father; Hyperlipidemia in his father; Hypertension in his mother; Lung cancer in his mother; Stomach cancer in his maternal grandfather.  ROS:   Review of Systems  Constitutional: Positive for malaise/fatigue. Negative for chills, diaphoresis, fever and weight loss.  HENT: Negative for congestion.   Eyes: Negative for discharge and redness.       Vision changes/floaters  Respiratory: Negative for cough, hemoptysis, sputum production, shortness of breath and wheezing.   Cardiovascular: Positive for chest pain. Negative for palpitations, orthopnea, claudication, leg  swelling and PND.  Gastrointestinal: Positive for heartburn and nausea. Negative for abdominal pain and vomiting.  Musculoskeletal: Negative for falls and myalgias.  Skin: Negative for rash.  Neurological: Positive for weakness and headaches. Negative for dizziness, tingling, tremors, sensory change, speech change, focal weakness and loss of consciousness.  Endo/Heme/Allergies: Does not bruise/bleed easily.  Psychiatric/Behavioral: Negative for substance abuse. The patient is not nervous/anxious.   All other systems reviewed and are negative.    EKGs/Labs/Other Studies Reviewed:    Studies reviewed were summarized above. The additional studies were reviewed today:  LHC 11/2017: Conclusions: 1. Moderate, non-obstructive coronary artery disease involving the proximal/mid LAD and mid RCA. Both lesions are not hemodynamically significant by FFR. 2. Hyperdynamic left ventricular contraction (LVEF >65%). 3. Moderately elevated left ventricular filling pressure, consistent with diastolic dysfunction.  Recommendations: 1. Medical therapy and risk factor modification to prevent progression of CAD. Will increase carvedilol to 6.25 mg BID and add atorvastatin 20 mg daily. 2. Initiate furosemide 20 mg daily and repeat BMP in ~2 weeks. 3. Consider transthoracic echo to evaluate  for other structural abnormalities. __________  2D Echo 12/2017: - Left ventricle: The cavity size was normal. There was mild concentric hypertrophy. Systolic function was normal. The estimated ejection fraction was in the range of 60% to 65%. Wall motion was normal; there were no regional wall motion abnormalities. Left ventricular diastolic function parameters were normal. - Left atrium: The atrium was mildly dilated.  EKG:  EKG is ordered today.  The EKG ordered today demonstrates NSR, 72 bpm, no acute ST-T changes  Recent Labs: 03/13/2019: ALT 15; BUN 17; Creatinine, Ser 1.02; Potassium 4.0; Sodium 139  Recent Lipid Panel    Component Value Date/Time   CHOL 212 (H) 03/02/2013 1119   TRIG 168.0 (H) 03/02/2013 1119   HDL 27.70 (L) 03/02/2013 1119   CHOLHDL 8 03/02/2013 1119   VLDL 33.6 03/02/2013 1119   LDLCALC 83 10/25/2011 1046   LDLDIRECT 135.8 03/02/2013 1119    PHYSICAL EXAM:    VS:  BP (!) 150/100 (BP Location: Left Arm, Patient Position: Sitting, Cuff Size: Normal)    Pulse 72    Temp (!) 97.2 F (36.2 C)    Ht 6\' 2"  (1.88 m)    Wt 208 lb (94.3 kg)    SpO2 93%    BMI 26.71 kg/m   BMI: Body mass index is 26.71 kg/m.  Physical Exam  Constitutional: He is oriented to person, place, and time. He appears well-developed and well-nourished.  HENT:  Head: Normocephalic and atraumatic.  Eyes: Right eye exhibits no discharge. Left eye exhibits no discharge.  Neck: Normal range of motion. No JVD present.  Cardiovascular: Normal rate, regular rhythm, S1 normal, S2 normal and normal heart sounds. Exam reveals no distant heart sounds, no friction rub, no midsystolic click and no opening snap.  No murmur heard. Pulses:      Posterior tibial pulses are 2+ on the right side and 2+ on the left side.  Pulmonary/Chest: Effort normal and breath sounds normal. No respiratory distress. He has no decreased breath sounds. He has no wheezes. He has no rales. He exhibits no  tenderness.  Abdominal: Soft. He exhibits no distension. There is no abdominal tenderness.  Musculoskeletal:        General: No edema.  Neurological: He is alert and oriented to person, place, and time.  Skin: Skin is warm and dry. No cyanosis. Nails show no clubbing.  Psychiatric: He has a normal mood and  affect. His speech is normal and behavior is normal. Judgment and thought content normal.    Wt Readings from Last 3 Encounters:  11/26/19 208 lb (94.3 kg)  08/23/19 204 lb (92.5 kg)  08/21/19 205 lb (93 kg)     ASSESSMENT & PLAN:   1. Nonobstructive CAD with chest pain moderate risk for cardiac etiology: Currently symptom-free.  Schedule coronary CTA to evaluate for hemodynamically significant stenosis in comparison of potential FFR to prior diagnostic cath.  Escalate antianginal antihypertensive therapy with titration of amlodipine to 10 mg daily.  Otherwise, he will continue carvedilol, losartan, and Crestor.  2. HTN: Blood pressure is suboptimally controlled.  Titrate amlodipine to 10 mg daily.  He is no longer on spironolactone secondary to prior AKI.  Continue current dose losartan and carvedilol.  Low-sodium diet recommended.  3. HLD: He seems to be tolerating Crestor 5 mg Monday, Wednesday, and Friday without issue.  Check fasting lipid panel and CMP with goal LDL being less than 70.  Disposition: F/u with [name] or an APP in 1 to 2 months.   Medication Adjustments/Labs and Tests Ordered: Current medicines are reviewed at length with the patient today.  Concerns regarding medicines are outlined above. Medication changes, Labs and Tests ordered today are summarized above and listed in the Patient Instructions accessible in Encounters.   Signed, [name], PA-C 11/26/2019 3:50 PM     [address] [place], [place] [address] [phone]

## 2019-11-26 NOTE — Patient Instructions (Signed)
Medication Instructions:  Your physician has recommended you make the following change in your medication:  1- INCREASE Amlodipine to 10 mg by mouth once a day.  *If you need a refill on your cardiac medications before your next appointment, please call your pharmacy*  Lab Work: Your physician recommends that you return for lab work: ANYTIME [place].  YOU WILL NEED PRIOR TO HAVING THE CT.   - Checking cholesterol and CMET.  - Please go to the [facility]. You will check in at the front desk to the right as you walk into the atrium. Valet Parking is offered if needed. - No appointment needed. You may go any day between 7 am and 6 pm. - You will need to be fasting. Please do not have anything to eat or drink after midnight the morning you have the lab work. You may only have water or black coffee with no cream or sugar.  If you have labs (blood work) drawn today and your tests are completely normal, you will receive your results only by: [name] MyChart Message (if you have MyChart) OR . A paper copy in the mail If you have any lab test that is abnormal or we need to change your treatment, we will call you to review the results.  Testing/Procedures:  Your cardiac CT will be scheduled at one of the below locations:   [facility] [address] [place], [place] [address] (336) [place] [address] [place], [place] [address] [phone]  If scheduled at [facility], please arrive at the [facility] main entrance of [facility] 30-45 minutes prior to test start time. Proceed to the [facility] Radiology Department (first floor) to check-in and test prep.  If scheduled at [facility], please arrive 15 mins early for check-in and test prep.  Please follow these instructions carefully (unless otherwise directed):  Hold all erectile dysfunction medications  at least 3 days (72 hrs) prior to test.  On the Night Before the Test: . Be sure to Drink plenty of water. . Do not consume any caffeinated/decaffeinated beverages or chocolate 12 hours prior to your test. . Do not take any antihistamines 12 hours prior to your test. . If the patient has contrast allergy: ? Patient will need a prescription for Prednisone and very clear instructions (as follows): 1. Prednisone 50 mg - take 13 hours prior to test 2. Take another Prednisone 50 mg 7 hours prior to test 3. Take another Prednisone 50 mg 1 hour prior to test 4. Take Benadryl 50 mg 1 hour prior to test . Patient must complete all four doses of above prophylactic medications. . Patient will need a ride after test due to Benadryl.  On the Day of the Test: . Drink plenty of water. Do not drink any water within one hour of the test. . Do not eat any food 4 hours prior to the test. . You may take your regular medications prior to the test.  . Take metoprolol (Lopressor) two hours prior to test. . HOLD Furosemide/Hydrochlorothiazide morning of the test.       After the Test: . Drink plenty of water. . After receiving IV contrast, you may experience a mild flushed feeling. This is normal. . On occasion, you may experience a mild rash up to 24 hours after the test. This is not dangerous. If this occurs, you can take Benadryl  25 mg and increase your fluid intake. . If you experience trouble breathing, this can be serious. If it is severe call 911 IMMEDIATELY. If it is mild, please call our office. . If you take any of these medications: Glipizide/Metformin, Avandament, Glucavance, please do not take 48 hours after completing test unless otherwise instructed.   Once we have confirmed authorization from your insurance company, we will call you to set up a date and time for your test.   For non-scheduling related questions, please contact the cardiac imaging nurse navigator should you have any  questions/concerns: Marchia Bond, RN Navigator Cardiac Imaging [facility] [phone] Office      Follow-Up: At [facility], you and your health needs are our priority.  As part of our continuing mission to provide you with exceptional heart care, we have created designated Provider Care Teams.  These Care Teams include your primary Cardiologist (physician) and Advanced Practice Providers (APPs -  Physician Assistants and Nurse Practitioners) who all work together to provide you with the care you need, when you need it.  Your next appointment:   1 month(s)  The format for your next appointment:   In Person    Cardiac CT Angiogram  A cardiac CT angiogram is a procedure to look at the heart and the area around the heart. It may be done to help find the cause of chest pains or other symptoms of heart disease. During this procedure, a large X-ray machine, called a CT scanner, takes detailed pictures of the heart and the surrounding area after a dye (contrast material) has been injected into blood vessels in the area. The procedure is also sometimes called a coronary CT angiogram, coronary artery scanning, or CTA. A cardiac CT angiogram allows the health care provider to see how well blood is flowing to and from the heart. The health care provider will be able to see if there are any problems, such as:  Blockage or narrowing of the coronary arteries in the heart.  Fluid around the heart.  Signs of weakness or disease in the muscles, valves, and tissues of the heart. Tell a health care provider about:  Any allergies you have. This is especially important if you have had a previous allergic reaction to contrast dye.  All medicines you are taking, including vitamins, herbs, eye drops, creams, and over-the-counter medicines.  Any blood disorders you have.  Any surgeries you have had.  Any medical conditions you have.  Whether you are pregnant or may be  pregnant.  Any anxiety disorders, chronic pain, or other conditions you have that may increase your stress or prevent you from lying still. What are the risks? Generally, this is a safe procedure. However, problems may occur, including:  Bleeding.  Infection.  Allergic reactions to medicines or dyes.  Damage to other structures or organs.  Kidney damage from the dye or contrast that is used.  Increased risk of cancer from radiation exposure. This risk is low. Talk with your health care provider about: ? The risks and benefits of testing. ? How you can receive the lowest dose of radiation. What happens before the procedure?  Wear comfortable clothing and remove any jewelry, glasses, dentures, and hearing aids.  Follow instructions from your health care provider about eating and drinking. This may include: ? For 12 hours before the test - avoid caffeine. This includes tea, coffee, soda, energy drinks, and diet pills. Drink plenty of water or other fluids that  do not have caffeine in them. Being well-hydrated can prevent complications. ? For 4-6 hours before the test - stop eating and drinking. The contrast dye can cause nausea, but this is less likely if your stomach is empty.  Ask your health care provider about changing or stopping your regular medicines. This is especially important if you are taking diabetes medicines, blood thinners, or medicines to treat erectile dysfunction. What happens during the procedure?  Hair on your chest may need to be removed so that small sticky patches called electrodes can be placed on your chest. These will transmit information that helps to monitor your heart during the test.  An IV tube will be inserted into one of your veins.  You might be given a medicine to control your heart rate during the test. This will help to ensure that good images are obtained.  You will be asked to lie on an exam table. This table will slide in and out of the CT  machine during the procedure.  Contrast dye will be injected into the IV tube. You might feel warm, or you may get a metallic taste in your mouth.  You will be given a medicine (nitroglycerin) to relax (dilate) the arteries in your heart.  The table that you are lying on will move into the CT machine tunnel for the scan.  The person running the machine will give you instructions while the scans are being done. You may be asked to: ? Keep your arms above your head. ? Hold your breath. ? Stay very still, even if the table is moving.  When the scanning is complete, you will be moved out of the machine.  The IV tube will be removed. The procedure may vary among health care providers and hospitals. What happens after the procedure?  You might feel warm, or you may get a metallic taste in your mouth from the contrast dye.  You may have a headache from the nitroglycerin.  After the procedure, drink water or other fluids to wash (flush) the contrast material out of your body.  Contact a health care provider if you have any symptoms of allergy to the contrast. These symptoms include: ? Shortness of breath. ? Rash or hives. ? A racing heartbeat.  Most people can return to their normal activities right after the procedure. Ask your health care provider what activities are safe for you.  It is up to you to get the results of your procedure. Ask your health care provider, or the department that is doing the procedure, when your results will be ready. Summary  A cardiac CT angiogram is a procedure to look at the heart and the area around the heart. It may be done to help find the cause of chest pains or other symptoms of heart disease.  During this procedure, a large X-ray machine, called a CT scanner, takes detailed pictures of the heart and the surrounding area after a dye (contrast material) has been injected into blood vessels in the area.  Ask your health care provider about changing or  stopping your regular medicines before the procedure. This is especially important if you are taking diabetes medicines, blood thinners, or medicines to treat erectile dysfunction.  After the procedure, drink water or other fluids to wash (flush) the contrast material out of your body. This information is not intended to replace advice given to you by your health care provider. Make sure you discuss any questions you have with your health care  provider. Document Released: 11/18/2008 Document Revised: 11/18/2017 Document Reviewed: 10/25/2016 Elsevier Patient Education  2020 [employer].   Provider:     You may see Nelva Bush, MD or [name], PA-C

## 2019-11-26 NOTE — Telephone Encounter (Signed)
Will assess patient at time of appointment.

## 2019-11-28 ENCOUNTER — Other Ambulatory Visit
Admission: RE | Admit: 2019-11-28 | Discharge: 2019-11-28 | Disposition: A | Discharge status: Home / Self Care | Payer: 59 | Source: Ambulatory Visit | Attending: Physician Assistant | Admitting: Physician Assistant

## 2019-11-28 ENCOUNTER — Telehealth: Payer: Self-pay

## 2019-11-28 DIAGNOSIS — E785 Hyperlipidemia, unspecified: Secondary | ICD-10-CM | POA: Diagnosis not present

## 2019-11-28 DIAGNOSIS — I2510 Atherosclerotic heart disease of native coronary artery without angina pectoris: Secondary | ICD-10-CM

## 2019-11-28 LAB — COMPREHENSIVE METABOLIC PANEL
ALT: 21 U/L (ref 0–44)
AST: 17 U/L (ref 15–41)
Albumin: 4.2 g/dL (ref 3.5–5.0)
Alkaline Phosphatase: 80 U/L (ref 38–126)
Anion gap: 10 (ref 5–15)
BUN: 18 mg/dL (ref 8–23)
CO2: 26 mmol/L (ref 22–32)
Calcium: 9.2 mg/dL (ref 8.9–10.3)
Chloride: 104 mmol/L (ref 98–111)
Creatinine, Ser: 1.17 mg/dL (ref 0.61–1.24)
GFR calc Af Amer: >60 mL/min (ref >60)
GFR calc non Af Amer: >60 mL/min (ref >60)
Glucose, Bld: 139 mg/dL — ABNORMAL HIGH (ref 70–99)
Potassium: 3.9 mmol/L (ref 3.5–5.1)
Sodium: 140 mmol/L (ref 135–145)
Total Bilirubin: 0.8 mg/dL (ref 0.3–1.2)
Total Protein: 7.5 g/dL (ref 6.5–8.1)

## 2019-11-28 LAB — LIPID PANEL
Cholesterol: 190 mg/dL (ref 0–200)
HDL: 40 mg/dL — ABNORMAL LOW (ref >40)
LDL Cholesterol: 127 mg/dL — ABNORMAL HIGH (ref 0–99)
Total CHOL/HDL Ratio: 4.8 RATIO
Triglycerides: 114 mg/dL (ref <150)
VLDL: 23 mg/dL (ref 0–40)

## 2019-11-28 NOTE — Telephone Encounter (Signed)
-----   Message from Rise Mu, PA-C sent at 11/28/2019 11:36 AM EST ----- Lipid panel shows an improved total cholesterol, triglycerides, and HDL.  LDL is slightly above goal.  Potassium ok.  Random glucose is mildly elevated with most recent A1c of 5.9.  Kidney function is normal. Liver function is normal.   Recommendations: Continue to Crestor 5 mg MWF as he is tolerating this.  Add Zetia 10 mg daily.

## 2019-11-28 NOTE — Telephone Encounter (Signed)
Attempted to call patient. LMTCB 11/28/2019

## 2019-12-03 MED ORDER — EZETIMIBE 10 MG PO TABS: 10.0000 mg | ORAL_TABLET | Freq: Every day | ORAL | 3 refills | Status: DC

## 2019-12-03 NOTE — Telephone Encounter (Signed)
Call to patient to review labs and POC.  No further questions or orders at this time.   Rx sent to pharmacy on file.   Advised pt to call for any further questions or concerns.

## 2019-12-05 ENCOUNTER — Telehealth: Payer: Self-pay | Admitting: Physician Assistant

## 2019-12-05 ENCOUNTER — Telehealth: Payer: Self-pay

## 2019-12-05 NOTE — Telephone Encounter (Signed)
With patient's contrast allergy, I would like to change his study from coronary CTA to [facility]. If this is abnormal, we can proceed with cath with pre-medication.

## 2019-12-05 NOTE — Telephone Encounter (Signed)
Your cardiac CT will be scheduled at one of the below locations:   [facility] [address] [place], [place] [address] (336) [place] [address] [place],  [address] [phone]  If scheduled at [facility], please arrive at the [facility] main entrance of [facility] 30-45 minutes prior to test start time. Proceed to the [facility] Radiology Department (first floor) to check-in and test prep.  If scheduled at [facility], please arrive 15 mins early for check-in and test prep.  Please follow these instructions carefully (unless otherwise directed):  Hold all erectile dysfunction medications at least 3 days (72 hrs) prior to test.  On the Night Before the Test: . Be sure to Drink plenty of water. . Do not consume any caffeinated/decaffeinated beverages or chocolate 12 hours prior to your test. . Do not take any antihistamines 12 hours prior to your test. . If the patient has contrast allergy: ? Patient will need a prescription for Prednisone and very clear instructions (as follows): 1. Prednisone 50 mg - take 13 hours prior to test 2. Take another Prednisone 50 mg 7 hours prior to test 3. Take another Prednisone 50 mg 1 hour prior to test 4. Take Benadryl 50 mg 1 hour prior to test . Patient must complete all four doses of above prophylactic medications. . Patient will need a ride after test due to Benadryl.  On the Day of the Test: . Drink plenty of water. Do not drink any water within one hour of the test. . Do not eat any food 4 hours prior to the test. . You may take your regular medications prior to the test.  . Take metoprolol (Lopressor) two hours prior to test. . HOLD Furosemide/Hydrochlorothiazide morning of the test. . FEMALES- please wear underwire-free bra if available   *For Clinical Staff only. Please instruct patient the  following:*        -Drink plenty of water       -Hold Furosemide/hydrochlorothiazide morning of the test       -Take metoprolol (Lopressor) 2 hours prior to test (if applicable).                  -If HR is less than 55 BPM- No Beta Blocker                -IF HR is greater than 55 BPM and patient is less than or equal to 64 yrs old Lopressor 100mg  x1.                -If HR is greater than 55 BPM and patient is greater than 21 yrs old Lopressor 50 mg x1.     Do not give Lopressor to patients with an allergy to lopressor or anyone with asthma or active COPD symptoms (currently taking steroids).       After the Test: . Drink plenty of water. . After receiving IV contrast, you may experience a mild flushed feeling. This is normal. . On occasion, you may experience a mild rash up to 24 hours after the test. This is not dangerous. If this occurs, you can take Benadryl 25 mg and increase your fluid intake. . If you experience trouble breathing, this can be serious. If it is severe call 911 IMMEDIATELY. If it is mild, please call our office. . If you take any of these medications: Glipizide/Metformin, Avandament, Glucavance, please do  not take 48 hours after completing test unless otherwise instructed.   Once we have confirmed authorization from your insurance company, we will call you to set up a date and time for your test.   For non-scheduling related questions, please contact the cardiac imaging nurse navigator should you have any questions/concerns: Marchia Bond, RN Navigator Cardiac Imaging [facility] [phone] Office

## 2019-12-05 NOTE — Telephone Encounter (Signed)
Call to patient to review advice from [name], [place].  He reports that he has pre medication for scan and has received contrast since allergy was identified and did fine with premed.   Pt prefers to continue with this plan if Thurmond Butts is okay with it.   Call routed to provider.

## 2019-12-05 NOTE — Telephone Encounter (Signed)
Returned call to patient. Okay to proceed with CT Friday.  Patient message sent.  [facility] [place], [place] [address] [phone]  If scheduled at [facility], please arrive at the [facility] main entrance of [facility] 30-45 minutes prior to test start time. Proceed to the [facility] Radiology Department (first floor) to check-in and test prep.

## 2019-12-05 NOTE — Telephone Encounter (Signed)
Ok to proceed with coronary CTA with premedication given he has tolerated contrast prior with premedication.

## 2019-12-06 ENCOUNTER — Telehealth (HOSPITAL_COMMUNITY): Payer: Self-pay | Admitting: Emergency Medicine

## 2019-12-06 ENCOUNTER — Encounter (HOSPITAL_COMMUNITY): Payer: Self-pay

## 2019-12-06 NOTE — Telephone Encounter (Signed)
Reaching out to patient to offer assistance regarding upcoming cardiac imaging study; pt verbalizes understanding of appt date/time, parking situation and where to check in, pre-test NPO status and medications ordered, and verified current allergies; name and call back number provided for further questions should they arise Marchia Bond RN [facility] [phone] office [phone] cell  Specifically discussed prednisone schedule for prior to CTA. Pt verbalized understanding. mychart message sent also

## 2019-12-07 ENCOUNTER — Ambulatory Visit (HOSPITAL_COMMUNITY)
Admission: RE | Admit: 2019-12-07 | Discharge: 2019-12-07 | Disposition: A | Discharge status: Home / Self Care | Payer: 59 | Source: Ambulatory Visit | Attending: Physician Assistant | Admitting: Physician Assistant

## 2019-12-07 ENCOUNTER — Other Ambulatory Visit: Payer: Self-pay | Admitting: Internal Medicine

## 2019-12-07 ENCOUNTER — Other Ambulatory Visit: Payer: Self-pay

## 2019-12-07 DIAGNOSIS — R079 Chest pain, unspecified: Secondary | ICD-10-CM | POA: Insufficient documentation

## 2019-12-07 MED ORDER — METOPROLOL TARTRATE 5 MG/5ML IV SOLN
INTRAVENOUS | Status: AC
  Administered 2019-12-07: 12:00:00 5 mg via INTRAVENOUS
  Filled 2019-12-07: qty 5

## 2019-12-07 MED ORDER — NITROGLYCERIN 0.4 MG SL SUBL
SUBLINGUAL_TABLET | SUBLINGUAL | Status: AC
  Filled 2019-12-07: qty 2

## 2019-12-07 MED ORDER — METOPROLOL TARTRATE 5 MG/5ML IV SOLN
5.0000 mg | INTRAVENOUS | Status: DC | PRN
  Administered 2019-12-07: 5 mg via INTRAVENOUS

## 2019-12-07 MED ORDER — DILTIAZEM HCL 25 MG/5ML IV SOLN
5.0000 mg | Freq: Once | INTRAVENOUS | Status: AC
  Administered 2019-12-07: 13:00:00 5 mg via INTRAVENOUS
  Filled 2019-12-07: qty 5

## 2019-12-07 MED ORDER — IOHEXOL 350 MG/ML SOLN
80.0000 mL | Freq: Once | INTRAVENOUS | Status: AC | PRN
  Administered 2019-12-07: 13:00:00 80 mL via INTRAVENOUS

## 2019-12-07 MED ORDER — NITROGLYCERIN 0.4 MG SL SUBL: 0.8000 mg | SUBLINGUAL_TABLET | Freq: Once | SUBLINGUAL | Status: DC

## 2019-12-07 MED ORDER — DILTIAZEM HCL 25 MG/5ML IV SOLN
INTRAVENOUS | Status: AC
  Administered 2019-12-07: 13:00:00 5 mg via INTRAVENOUS
  Filled 2019-12-07: qty 5

## 2019-12-07 MED ORDER — DILTIAZEM HCL 25 MG/5ML IV SOLN
5.0000 mg | Freq: Once | INTRAVENOUS | Status: AC
  Filled 2019-12-07: qty 5

## 2019-12-07 MED ORDER — METOPROLOL TARTRATE 5 MG/5ML IV SOLN
INTRAVENOUS | Status: AC
  Administered 2019-12-07: 5 mg via INTRAVENOUS
  Filled 2019-12-07: qty 10

## 2019-12-07 NOTE — Progress Notes (Signed)
Spoke with Dr. Philis Pique regarding premedications and medications administered in IR pre CT Cardiac study (see MAR).  Cardiazem ordered to get the patients heart rate in to the 70's then will scan.  Patient understands and is compliant.

## 2019-12-11 ENCOUNTER — Telehealth: Payer: Self-pay | Admitting: Internal Medicine

## 2019-12-11 DIAGNOSIS — I2510 Atherosclerotic heart disease of native coronary artery without angina pectoris: Secondary | ICD-10-CM

## 2019-12-11 DIAGNOSIS — E785 Hyperlipidemia, unspecified: Secondary | ICD-10-CM

## 2019-12-11 MED ORDER — HYDRALAZINE HCL 25 MG PO TABS: 25.0000 mg | ORAL_TABLET | Freq: Two times a day (BID) | ORAL | 3 refills | Status: DC

## 2019-12-11 NOTE — Telephone Encounter (Signed)
Brian Mu, PA-C  [name].[name], RN  Cardiac overread:  Calcium score of 496, which is in the 86th percentile.  Nonobstructive disease within the left main, LAD, LCx, and RCA.  Normal size aorta.   Recommendation:  -Aggressive medical management.  -His most recent LDL of 127 is above goal of < 70. He is intolerant to daily statin therapy. Please see if he is agreeable to being seen in the lipid clinic in [place] to see if he will qualify for PCSK-9 inhibitor.   Call to patient to discuss results and POC from [name], PA> he reports symptomatic HTN for several days and would like to go ahead and start medication.   I asked him to send BP readings 1 week after starting new medication.   Advised pt to call for any further questions or concerns.

## 2019-12-11 NOTE — Telephone Encounter (Signed)
Patient calling to discuss recent CT  testing results   Please call wife patient working

## 2019-12-11 NOTE — Telephone Encounter (Signed)
Spoke with the pts wife, Brian Atkins, on [place], and she verbalized understanding of the cardiac CT result and Brian Atkins's PA recommendations and they agree to referral to the Lipid clinic.   She reports that his BP has been staying around 150/90. I have asked her to check his BP twice a day after he has been sitting for about 20-30 min and she has been checking it with him sitting... to record for his appt with [name] 12/27/19.[name] will forward to [name] PA  For review.

## 2019-12-11 NOTE — Telephone Encounter (Signed)
Continue to monitor BP. If readings remain elevated, we can add hydralazine 25 mg bid.

## 2019-12-18 ENCOUNTER — Other Ambulatory Visit: Payer: Self-pay

## 2019-12-18 ENCOUNTER — Ambulatory Visit (INDEPENDENT_AMBULATORY_CARE_PROVIDER_SITE_OTHER): Payer: 59 | Admitting: Pharmacist

## 2019-12-18 VITALS — BP 128/82 | HR 65

## 2019-12-18 DIAGNOSIS — I10 Essential (primary) hypertension: Secondary | ICD-10-CM | POA: Diagnosis not present

## 2019-12-18 DIAGNOSIS — E785 Hyperlipidemia, unspecified: Secondary | ICD-10-CM

## 2019-12-18 NOTE — Patient Instructions (Addendum)
It was a pleasure to meet you today!  I will submit a prior authorization for Repatha or Praluent. I will call you once its approved.  Continue taking rosuvastatin 5mg  on Monday, Wed and Friday. Continue Zetia 10mg  daily.  Consider readings Eat, Sleep, Move, Breath: a beginners guide to a healthier lifestyle.  Try to increase your exercise to 30 min, 5 days a week of moderate aerobic exercise with 2 days of weight/resistance training.  Please call us at [phone] with any questions or concerns.

## 2019-12-18 NOTE — Progress Notes (Signed)
Patient ID: Brian Atkins                 DOB: December 20, 1956                    MRN: [id number]     HPI: Brian Atkins is a 63 y.o. male patient of [name] referred to PharmD by [name], PA. PMH is significant for  nonobstructive CAD by [facility] in 11/2017, HTN, marginal zone B-cell lymphoma, headache disorder,diverticulitis,and GERD status post Nissen fundoplication. He recently had a CT scan done showing a coronary calcium score 496 (86th percentile).   Patient presents today for discussion of PCSK9-I. Currently patient is on rosuvastatin 5mg  three times a week and zetia 10mg  daily. He had brain fog on atorvastatin. Tolerating current dose of rosuvastatin well.  Patient amlodipine was recently increased to 10mg  daily due to elevated blood pressure. He was also started on hydralazine 25mg , but has not gotten from pharmacy yet. Just received notification the Rx was ready. He states that he has had increased dizziness lately. More on his left side which is where he also gets his ocular migraines. He has not checked his blood pressure when he has felt dizzy. States blood pressures have been running averaging 145/90-100 HR 70 at home. He has an OMRON upper arm blood pressure cuff and checks his blood pressure at night, about 2 hours after taking his medications.  Current Medications: rosuvastatin 5mg  MWF, zetia 10mg  Intolerances: atorvastatin (foggy head), simvastatin Risk Factors: CAD, HTN, coronary calcium score 496 (86th percentile) LDL goal: <70  Diet: breakfast- raisin brain Snack- chocolate protein drink, peanutbutter crackers [place] sandwich, pumpernickle bread Chicken breast green means  Exercise: walks all day at work- walks at the house (but not much lately)  Family History: The patient's family history includes Aortic aneurysm (age of onset: 28) in his father; COPD in his mother; Cancer in his mother; Coronary artery disease (age of onset: 47) in his father; Hyperlipidemia in his father;  Hypertension in his mother; Lung cancer in his mother; Stomach cancer in his maternal grandfather  Social History:   Labs:11/28/2019 TC 190 TG 114 HDL 40 LDL 127 (rosuvastatin 5mg  MWF)  Past Medical History:  Diagnosis Date  . Complication of anesthesia    slow to wake  . Coronary artery disease, non-occlusive    a. LHC 12/18: ostLAD 20%, p/mLAD 60% FFR 0.84, ost ramus 50%, mRCA 50% FFR 0.94, EF 65%  . Diverticulitis   . Diverticulosis   . Dyslipidemia   . Family history of adverse reaction to anesthesia    Mother - slow to wake  . GERD (gastroesophageal reflux disease)   . Hemorrhoids   . Hiatal hernia   . History of echocardiogram    a. TTE 1/19: EF of 60-65%, normal wall motion, normal LV diastolic function, mildly dilated LA  . Labile hypertension   . Lymphoma ([place])   . Medial epicondylitis of right elbow   . Meralgia paresthetica of right side   . Microscopic hematuria   . Ocular migraine   . Sleep apnea    CPAP    Current Outpatient Medications on File Prior to Visit  Medication Sig Dispense Refill  . ALPRAZolam (XANAX) 0.25 MG tablet Take 1 tablet (0.25 mg total) by mouth at bedtime as needed for anxiety. (Patient not taking: Reported on 12/07/2019) 30 tablet 1  . amLODipine (NORVASC) 10 MG tablet Take 1 tablet (10 mg total) by mouth daily. 90 tablet  2  . atorvastatin (LIPITOR) 20 MG tablet Take 1 tablet (20 mg total) by mouth daily. 90 tablet 3  . carvedilol (COREG) 25 MG tablet TAKE 1 TABLET BY MOUTH TWICE A DAY 180 tablet 0  . cyanocobalamin (,VITAMIN B-12,) 1000 MCG/ML injection INJECT 1 ML INTO THE MUSCLE DAILY FOR 3 DAYS, THEN WEEKLY FOR 4 WEEKS , THEN MONTHLY THEREAFTER 7 mL 4  . dexlansoprazole (DEXILANT) 60 MG capsule Take 60 mg by mouth 2 (two) times daily.    . diphenhydrAMINE (BENADRYL) 50 MG tablet Take 1 tablet (50 mg) by mouth 1 hour prior to CT. 1 tablet 0  . ezetimibe (ZETIA) 10 MG tablet Take 1 tablet (10 mg total) by mouth daily. 90 tablet 3  .  hydrALAZINE (APRESOLINE) 25 MG tablet Take 1 tablet (25 mg total) by mouth 2 (two) times daily. 180 tablet 3  . LORazepam (ATIVAN) 1 MG tablet Take 1 tablet (1 mg total) by mouth every 8 (eight) hours as needed. 30 tablet 3  . losartan (COZAAR) 100 MG tablet TAKE 1 TABLET BY MOUTH EVERY DAY 90 tablet 0  . Melatonin 3 MG TABS Take 10 mg by mouth at bedtime.     . metoprolol tartrate (LOPRESSOR) 100 MG tablet Take 1 tablet (100 mg) by mouth 2 hours prior to CT. 1 tablet 0  . nitroGLYCERIN (NITROSTAT) 0.4 MG SL tablet Place 1 tablet (0.4 mg total) under the tongue every 5 (five) minutes as needed for chest pain. Maximum of 3 doses. (Patient not taking: Reported on 08/20/2019) 35 tablet 3  . ondansetron (ZOFRAN) 8 MG tablet Take 1 tablet (8 mg total) by mouth daily as needed. for nausea 90 tablet 1  . ONETOUCH VERIO test strip USE TO CHECK BLOOD SUGAR AS NEEDED 100 strip 2  . predniSONE (DELTASONE) 50 MG tablet Take 1 tablet (50 mg) by mouth 13 hours prior to, take 1 tablet (50 mg) by mouth 7 hours prior to and take 1 tablet (50 mg) by mouth 1 hour prior to coronary CT. 3 tablet 0  . rosuvastatin (CRESTOR) 5 MG tablet Take 1 tablet (5 mg total) once daily on m/w/f. (Patient not taking: Reported on 12/07/2019) 45 tablet 3  . Syringe/Needle, Disp, (SYRINGE 3CC/25GX1") 25G X 1" 3 ML MISC Use for b12 injections 50 each 0  . zolpidem (AMBIEN) 10 MG tablet Take 1 tablet (10 mg total) by mouth at bedtime as needed for sleep. 30 tablet 5   No current facility-administered medications on file prior to visit.    Allergies  Allergen Reactions  . Avelox [Moxifloxacin] Hives and Swelling    Severe facial swelling  . Contrast Media [Iodinated Diagnostic Agents] Swelling    (OK with pre-medication)  . Crestor [Rosuvastatin Calcium]     myalgia  . Morphine And Related Itching  . Niacin-Simvastatin Er     myalgia  . Latex Rash    Gloves cause raxh  . Tape Dermatitis    Assessment/Plan:  1. Hyperlipidemia  - LDL is above goal of <70. We had a discussion about the importance of statins on cardiovascular risk reduction (plaque stabilization, decrease inflamation, plaque regression) and PCSK9i role in therapy. Will continue rosuvastatin 5mg  three times a week, zetia 10mg  daily (for now) and start Repatha or Praluent. I will submit prior authorization and will call patient once approved. 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 Also discussed the extreme importance of diet and exercise of cardiovascular risk reduction. Talked about  ways to lower Na from the diet (due to elevated blood pressure) and decrease carbs and sugars. Discussed the importance of aerorbic exercise such as brisk walking and also resistance training and core strength. Recommended patient read the book  Eat, Sleep, Move, Breath: a beginners guide to a healthier lifestyle.   2. Hypertension- Blood pressure today in clinic was just above goal of <130/80. Patient reports blood pressure higher at home. He also complains of increased dizziness. I have asked him to check his blood pressure at home when he is dizzy to make sure that it isn't dropping to low. Dizziness could be side effect of medication or could be headache related, but should rule out hypotension, especially if he is going to stat an additional blood pressure medication. Follow up with [name] next week in the office.   Thank you,  Ramond Dial, Pharm.D, BCPS, CPP [place]  [id number] [place], [place] [address]  Phone: [phone]; Fax: [phone]

## 2019-12-19 ENCOUNTER — Telehealth: Payer: Self-pay | Admitting: Pharmacist

## 2019-12-19 MED ORDER — ROSUVASTATIN CALCIUM 20 MG PO TABS: 20.0000 mg | ORAL_TABLET | Freq: Every day | ORAL | 11 refills | Status: DC

## 2019-12-19 NOTE — Telephone Encounter (Addendum)
Prior authorization denied for Praluent. I spoke with the patient as to whether he has ever tried higher doses of rosuvastatin. Although listed in his allergies, patient did not remember trying higher doses of rosuvastatin. I do not see history of being on rosuvastatin in past notes. I do see simvastatin which he had myalgias to and then atorvastatin, which we thought was causing headaches, but these did not improve when stopping.  Either way, I think a re-challenge with high intensity statin is vaild.  Will try rosuvastatin 20mg  daily. If patient doesn't tolerate or LDL still above goal, then will resubmit for PCSK9i.

## 2019-12-24 ENCOUNTER — Ambulatory Visit: Payer: 59 | Attending: Internal Medicine

## 2019-12-24 DIAGNOSIS — Z8616 Personal history of COVID-19: Secondary | ICD-10-CM

## 2019-12-24 DIAGNOSIS — Z20822 Contact with and (suspected) exposure to covid-19: Secondary | ICD-10-CM

## 2019-12-24 HISTORY — DX: Personal history of COVID-19: Z86.16

## 2019-12-25 ENCOUNTER — Telehealth: Payer: Self-pay | Admitting: Internal Medicine

## 2019-12-25 LAB — NOVEL CORONAVIRUS, NAA: SARS-CoV-2, NAA: DETECTED — AB

## 2019-12-25 NOTE — Telephone Encounter (Signed)
Spoke with pt and scheduled him for a virtual visit with Dr. Derrel Nip. Pt stated that he sent a mychart message so the message has been routed to Dr. Derrel Nip.

## 2019-12-25 NOTE — Telephone Encounter (Signed)
Pt  Tested positive for Covid and would like a call back about how to treat it. Pt sent over a MyChart message and states he is taking Tylenol and Mucinex. Please advise @ [phone]

## 2019-12-27 ENCOUNTER — Ambulatory Visit: Payer: 59 | Admitting: Internal Medicine

## 2019-12-28 ENCOUNTER — Other Ambulatory Visit: Payer: Self-pay

## 2019-12-28 ENCOUNTER — Ambulatory Visit (INDEPENDENT_AMBULATORY_CARE_PROVIDER_SITE_OTHER): Payer: 59 | Admitting: Internal Medicine

## 2019-12-28 DIAGNOSIS — U071 COVID-19: Secondary | ICD-10-CM | POA: Diagnosis not present

## 2019-12-28 HISTORY — DX: COVID-19: U07.1

## 2019-12-28 MED ORDER — AZITHROMYCIN 500 MG PO TABS: 500.0000 mg | ORAL_TABLET | Freq: Every day | ORAL | 0 refills | Status: DC

## 2019-12-28 MED ORDER — CHERATUSSIN AC 100-10 MG/5ML PO SOLN: 5.0000 mL | Freq: Three times a day (TID) | ORAL | 0 refills | Status: DC | PRN

## 2019-12-28 NOTE — Progress Notes (Signed)
O2 has been staying around 91% at rest. Productive cough brown, yellow and green. Tested positive on Tuesday and symptoms started on Sunday.

## 2019-12-28 NOTE — Assessment & Plan Note (Signed)
azithromycin and cheratussin prescribed for symptoms suggestvie of concurrent pneumonia .  Advised ot continue quqrantien until Jan 19 and ivsed to see medical attention if sats drop to 88% or below

## 2019-12-28 NOTE — Progress Notes (Signed)
Telephone Note  This visit type was conducted due to national recommendations for restrictions regarding the COVID-19 pandemic (e.g. social distancing).  This format is felt to be most appropriate for this patient at this time.  All issues noted in this document were discussed and addressed.  No physical exam was performed (except for noted visual exam findings with Video Visits).   I connected with@ on 12/28/19 at  2:00 PM EST by  telephone and verified that I am speaking with the correct person using two identifiers. Location patient: home Location provider: work or home office Persons participating in the virtual visit: patient, provider  I discussed the limitations, risks, security and privacy concerns of performing an evaluation and management service by telephone and the availability of in person appointments. I also discussed with the patient that there may be a patient responsible charge related to this service. The patient expressed understanding and agreed to proceed.  Reason for visit: SYMPTOMATIC COVID 19 INFECTION   HPI:  64 YR OLD WITH MALT lymphoma .  Symptoms began Jan 3 and tested positive jan 5. Had an exposure at work.  Feels fatigued.  Has had cough productive of  Purulent  sputum ,  Mild hypoxia,  sats 91% at rest . Using tylenol motrin,  mucinex . Some nauseau,  No appetite .  Has used lorazepam and compazine in the past for chemo induce nausea and has a supply of each left    ROS: See pertinent positives and negatives per HPI.  Past Medical History:  Diagnosis Date  . Complication of anesthesia    slow to wake  . Coronary artery disease, non-occlusive    a. LHC 12/18: ostLAD 20%, p/mLAD 60% FFR 0.84, ost ramus 50%, mRCA 50% FFR 0.94, EF 65%  . Diverticulitis   . Diverticulosis   . Dyslipidemia   . Family history of adverse reaction to anesthesia    Mother - slow to wake  . GERD (gastroesophageal reflux disease)   . Hemorrhoids   . Hiatal hernia   . History of  echocardiogram    a. TTE 1/19: EF of 60-65%, normal wall motion, normal LV diastolic function, mildly dilated LA  . Labile hypertension   . Lymphoma ([place])   . Medial epicondylitis of right elbow   . Meralgia paresthetica of right side   . Microscopic hematuria   . Ocular migraine   . Sleep apnea    CPAP    Past Surgical History:  Procedure Laterality Date  . INTRAVASCULAR PRESSURE WIRE/FFR STUDY N/A 12/07/2017   Procedure: INTRAVASCULAR PRESSURE WIRE/FFR STUDY;  Surgeon: Nelva Bush, MD;  Location: [facility];  Service: Cardiovascular;  Laterality: N/A;  . LEFT HEART CATH AND CORONARY ANGIOGRAPHY Left 12/07/2017   Procedure: LEFT HEART CATH AND CORONARY ANGIOGRAPHY;  Surgeon: Nelva Bush, MD;  Location: [facility];  Service: Cardiovascular;  Laterality: Left;  . nissen funduplication  [id number]   [name]  . SEPTOPLASTY Bilateral 08/23/2019   Procedure: SEPTOPLASTY;  Surgeon: [name], MD;  Location: [facility];  Service: ENT;  Laterality: Bilateral;  . SPLENECTOMY    . TURBINATE REDUCTION Bilateral 08/23/2019   Procedure: TURBINATE REDUCTION;  Surgeon: [name], MD;  Location: [facility];  Service: ENT;  Laterality: Bilateral;  Latex sensitivity sleep apnea    Family History  Problem Relation Age of Onset  . Coronary artery disease Father 83       CABG  . Aortic aneurysm Father 64  repaired  . Hyperlipidemia Father   . Lung cancer Mother   . Hypertension Mother   . Cancer Mother        bladder  . COPD Mother   . Stomach cancer Maternal Grandfather     SOCIAL HX:  reports that he has never smoked. He has never used smokeless tobacco. He reports that he does not drink alcohol or use drugs.   Current Outpatient Medications:  .  amLODipine (NORVASC) 10 MG tablet, Take 1 tablet (10 mg total) by mouth daily., Disp: 90 tablet, Rfl: 2 .  azithromycin (ZITHROMAX) 500 MG tablet, Take 1 tablet (500 mg total) by mouth  daily., Disp: 7 tablet, Rfl: 0 .  carvedilol (COREG) 25 MG tablet, TAKE 1 TABLET BY MOUTH TWICE A DAY, Disp: 180 tablet, Rfl: 0 .  cyanocobalamin (,VITAMIN B-12,) 1000 MCG/ML injection, INJECT 1 ML INTO THE MUSCLE DAILY FOR 3 DAYS, THEN WEEKLY FOR 4 WEEKS , THEN MONTHLY THEREAFTER, Disp: 7 mL, Rfl: 4 .  dexlansoprazole (DEXILANT) 60 MG capsule, Take 60 mg by mouth 2 (two) times daily., Disp: , Rfl:  .  ezetimibe (ZETIA) 10 MG tablet, Take 1 tablet (10 mg total) by mouth daily., Disp: 90 tablet, Rfl: 3 .  Galcanezumab-gnlm (EMGALITY) 120 MG/ML SOAJ, Inject 120 mg into the skin every 30 (thirty) days., Disp: , Rfl:  .  guaiFENesin-codeine (CHERATUSSIN AC) 100-10 MG/5ML syrup, Take 5 mLs by mouth 3 (three) times daily as needed for cough., Disp: 120 mL, Rfl: 0 .  hydrALAZINE (APRESOLINE) 25 MG tablet, Take 1 tablet (25 mg total) by mouth 2 (two) times daily. (Patient not taking: Reported on 12/18/2019), Disp: 180 tablet, Rfl: 3 .  LORazepam (ATIVAN) 1 MG tablet, Take 1 tablet (1 mg total) by mouth every 8 (eight) hours as needed., Disp: 30 tablet, Rfl: 3 .  losartan (COZAAR) 100 MG tablet, TAKE 1 TABLET BY MOUTH EVERY DAY, Disp: 90 tablet, Rfl: 0 .  Melatonin 3 MG TABS, Take 10 mg by mouth at bedtime. , Disp: , Rfl:  .  nitroGLYCERIN (NITROSTAT) 0.4 MG SL tablet, Place 1 tablet (0.4 mg total) under the tongue every 5 (five) minutes as needed for chest pain. Maximum of 3 doses. (Patient not taking: Reported on 08/20/2019), Disp: 35 tablet, Rfl: 3 .  ONETOUCH VERIO test strip, USE TO CHECK BLOOD SUGAR AS NEEDED, Disp: 100 strip, Rfl: 2 .  prochlorperazine (COMPAZINE) 5 MG tablet, Take 5 mg by mouth every 6 (six) hours as needed for nausea or vomiting., Disp: , Rfl:  .  Prucalopride Succinate (MOTEGRITY) 2 MG TABS, Take 1 tablet by mouth daily., Disp: , Rfl:  .  rosuvastatin (CRESTOR) 20 MG tablet, Take 1 tablet (20 mg total) by mouth daily., Disp: 30 tablet, Rfl: 11 .  senna (SENOKOT) 8.6 MG TABS tablet,  Take 1 tablet by mouth., Disp: , Rfl:  .  Syringe/Needle, Disp, (SYRINGE 3CC/25GX1") 25G X 1" 3 ML MISC, Use for b12 injections, Disp: 50 each, Rfl: 0 .  zolpidem (AMBIEN) 10 MG tablet, Take 1 tablet (10 mg total) by mouth at bedtime as needed for sleep., Disp: 30 tablet, Rfl: 5  EXAM:  General impression: alert, cooperative and articulate.  No signs of being in distress  Lungs: speech is fluent sentence length suggests that patient is not short of breath , but  Speech is  punctuated by cough,  .   Psych: affect normal.  speech is articulate and non pressured .  Denies suicidal thoughts  MS: moves all visible extremities without noticeable abnormality  PSYCH/NEURO: pleasant and cooperative, no obvious depression or anxiety, speech and thought processing grossly intact  ASSESSMENT AND PLAN:  Discussed the following assessment and plan:  COVID-19 virus infection  COVID-19 virus infection azithromycin and cheratussin prescribed for symptoms suggestvie of concurrent pneumonia .  Advised ot continue quqrantien until Jan 19 and ivsed to see medical attention if sats drop to 88% or below     I discussed the assessment and treatment plan with the patient. The patient was provided an opportunity to ask questions and all were answered. The patient agreed with the plan and demonstrated an understanding of the instructions.   The patient was advised to call back or seek an in-person evaluation if the symptoms worsen or if the condition fails to improve as anticipated.  I provided  30 minutes of non-face-to-face time during this encounter.   [name], MD

## 2020-01-04 ENCOUNTER — Telehealth: Payer: Self-pay

## 2020-01-04 MED ORDER — NYSTATIN 100000 UNIT/GM EX POWD: 1.0000 "application " | Freq: Two times a day (BID) | CUTANEOUS | 0 refills | Status: DC

## 2020-01-04 NOTE — Telephone Encounter (Signed)
Nystatin powder use twice daily sent to [facility]

## 2020-01-04 NOTE — Telephone Encounter (Signed)
Spoke with pt's wife and she stated that the pt has become raw in his groin area. She stated that they have tried using butt paste because it looks like a really bad diaper rash but it has not helped it at all. She is wondering if something could be called in for him?

## 2020-01-04 NOTE — Telephone Encounter (Signed)
Spoke with pt's wife to let her know that the medication has been sent to [facility] in [place] and that he uses it twice daily.

## 2020-01-18 ENCOUNTER — Ambulatory Visit: Payer: 59 | Admitting: Internal Medicine

## 2020-01-21 ENCOUNTER — Encounter: Payer: Self-pay | Admitting: Internal Medicine

## 2020-01-21 ENCOUNTER — Telehealth: Payer: Self-pay | Admitting: Internal Medicine

## 2020-01-21 ENCOUNTER — Ambulatory Visit (INDEPENDENT_AMBULATORY_CARE_PROVIDER_SITE_OTHER): Payer: 59 | Admitting: Internal Medicine

## 2020-01-21 ENCOUNTER — Other Ambulatory Visit: Payer: Self-pay

## 2020-01-21 DIAGNOSIS — J0110 Acute frontal sinusitis, unspecified: Secondary | ICD-10-CM

## 2020-01-21 DIAGNOSIS — U071 COVID-19: Secondary | ICD-10-CM

## 2020-01-21 MED ORDER — BENZONATATE 200 MG PO CAPS: 200.0000 mg | ORAL_CAPSULE | Freq: Three times a day (TID) | ORAL | 1 refills | Status: DC | PRN

## 2020-01-21 MED ORDER — AMOXICILLIN-POT CLAVULANATE 875-125 MG PO TABS: 1.0000 | ORAL_TABLET | Freq: Two times a day (BID) | ORAL | 0 refills | Status: DC

## 2020-01-21 NOTE — Telephone Encounter (Signed)
Pt called wanting an appt with Dr. Derrel Nip asap or to talk to [facility]. He saw Dr. Derrel Nip on 12/28/19 and he is starting to have symptoms again cough, Post nasal drop and wanted to know what he need to do

## 2020-01-21 NOTE — Progress Notes (Signed)
Virtual Visit via [email or web address]  This visit type was conducted due to national recommendations for restrictions regarding the COVID-19 pandemic (e.g. social distancing).  This format is felt to be most appropriate for this patient at this time.  All issues noted in this document were discussed and addressed.  No physical exam was performed (except for noted visual exam findings with Video Visits).   I connected with@ on 01/21/20 at  4:30 PM EST by a video enabled telemedicine application  and verified that I am speaking with the correct person using two identifiers. Location patient: home Location provider: work or home office Persons participating in the virtual visit: patient, provider  I discussed the limitations, risks, security and privacy concerns of performing an evaluation and management service by telephone and the availability of in person appointments. I also discussed with the patient that there may be a patient responsible charge related to this service. The patient expressed understanding and agreed to proceed.  Reason for visit:  Follow up on COVID INFECTION   HPI:  64 yr old male with history of orbital lymphoma, MALT lymphoma of head and neck  S/p chemotherapy ,  diagnosed with COVID 19 infection  On January 5, presents for follow up.  He was treated with prednisone and azithromycin starting on Jan 8  For concurrent pneumonia (presumed based on purulent sputum) with resolution of purulent sputum. Hypoxia has improved.  However, several days after finishing azithromycin he developed sinus congestion and drainage  And decreased appetite.  Occasional chest tightness but no wheezing or dyspnea.  Has not checked temp.   Tried using sudafed but developed cardiac side effects.  Using cheratussin for cough suppression at night but needs a daytime nonsedating cough suppressant.   .  ROS: See pertinent positives and negatives per HPI.  Past Medical History:  Diagnosis Date  . Complication of  anesthesia    slow to wake  . Coronary artery disease, non-occlusive    a. LHC 12/18: ostLAD 20%, p/mLAD 60% FFR 0.84, ost ramus 50%, mRCA 50% FFR 0.94, EF 65%  . Diverticulitis   . Diverticulosis   . Dyslipidemia   . Family history of adverse reaction to anesthesia    Mother - slow to wake  . GERD (gastroesophageal reflux disease)   . Hemorrhoids   . Hiatal hernia   . History of echocardiogram    a. TTE 1/19: EF of 60-65%, normal wall motion, normal LV diastolic function, mildly dilated LA  . Labile hypertension   . Lymphoma ([place])   . Medial epicondylitis of right elbow   . Meralgia paresthetica of right side   . Microscopic hematuria   . Ocular migraine   . Sleep apnea    CPAP    Past Surgical History:  Procedure Laterality Date  . INTRAVASCULAR PRESSURE WIRE/FFR STUDY N/A 12/07/2017   Procedure: INTRAVASCULAR PRESSURE WIRE/FFR STUDY;  Surgeon: Nelva Bush, MD;  Location: [facility];  Service: Cardiovascular;  Laterality: N/A;  . LEFT HEART CATH AND CORONARY ANGIOGRAPHY Left 12/07/2017   Procedure: LEFT HEART CATH AND CORONARY ANGIOGRAPHY;  Surgeon: Nelva Bush, MD;  Location: [facility];  Service: Cardiovascular;  Laterality: Left;  . nissen funduplication  [id number]   [name]  . SEPTOPLASTY Bilateral 08/23/2019   Procedure: SEPTOPLASTY;  Surgeon: [name], MD;  Location: [facility];  Service: ENT;  Laterality: Bilateral;  . SPLENECTOMY    . TURBINATE REDUCTION Bilateral 08/23/2019   Procedure: TURBINATE REDUCTION;  Surgeon: Kathyrn Sheriff,  Eddie Dibbles, MD;  Location: [facility];  Service: ENT;  Laterality: Bilateral;  Latex sensitivity sleep apnea    Family History  Problem Relation Age of Onset  . Coronary artery disease Father 48       CABG  . Aortic aneurysm Father 64       repaired  . Hyperlipidemia Father   . Lung cancer Mother   . Hypertension Mother   . Cancer Mother        bladder  . COPD Mother   . Stomach cancer  Maternal Grandfather     SOCIAL HX:  reports that he has never smoked. He has never used smokeless tobacco. He reports that he does not drink alcohol or use drugs.   Current Outpatient Medications:  .  amitriptyline (ELAVIL) 10 MG tablet, Take 10 mg by mouth at bedtime., Disp: , Rfl:  .  amLODipine (NORVASC) 10 MG tablet, Take 1 tablet (10 mg total) by mouth daily., Disp: 90 tablet, Rfl: 2 .  amLODipine (NORVASC) 2.5 MG tablet, Take 2.5 mg by mouth daily., Disp: , Rfl:  .  carvedilol (COREG) 25 MG tablet, TAKE 1 TABLET BY MOUTH TWICE A DAY, Disp: 180 tablet, Rfl: 0 .  cyanocobalamin (,VITAMIN B-12,) 1000 MCG/ML injection, INJECT 1 ML INTO THE MUSCLE DAILY FOR 3 DAYS, THEN WEEKLY FOR 4 WEEKS , THEN MONTHLY THEREAFTER, Disp: 7 mL, Rfl: 4 .  dexlansoprazole (DEXILANT) 60 MG capsule, Take 60 mg by mouth 2 (two) times daily., Disp: , Rfl:  .  ezetimibe (ZETIA) 10 MG tablet, Take 1 tablet (10 mg total) by mouth daily., Disp: 90 tablet, Rfl: 3 .  Galcanezumab-gnlm (EMGALITY) 120 MG/ML SOAJ, Inject 120 mg into the skin every 30 (thirty) days., Disp: , Rfl:  .  guaiFENesin-codeine (CHERATUSSIN AC) 100-10 MG/5ML syrup, Take 5 mLs by mouth 3 (three) times daily as needed for cough., Disp: 120 mL, Rfl: 0 .  hydrALAZINE (APRESOLINE) 25 MG tablet, Take 1 tablet (25 mg total) by mouth 2 (two) times daily., Disp: 180 tablet, Rfl: 3 .  LORazepam (ATIVAN) 1 MG tablet, Take 1 tablet (1 mg total) by mouth every 8 (eight) hours as needed., Disp: 30 tablet, Rfl: 3 .  losartan (COZAAR) 100 MG tablet, TAKE 1 TABLET BY MOUTH EVERY DAY, Disp: 90 tablet, Rfl: 0 .  Melatonin 3 MG TABS, Take 10 mg by mouth at bedtime. , Disp: , Rfl:  .  nystatin (MYCOSTATIN/NYSTOP) powder, Apply 1 application topically 2 (two) times daily. To rash until resolved., Disp: 15 g, Rfl: 0 .  ONETOUCH VERIO test strip, USE TO CHECK BLOOD SUGAR AS NEEDED, Disp: 100 strip, Rfl: 2 .  prochlorperazine (COMPAZINE) 5 MG tablet, Take 5 mg by mouth every  6 (six) hours as needed for nausea or vomiting., Disp: , Rfl:  .  Prucalopride Succinate (MOTEGRITY) 2 MG TABS, Take 1 tablet by mouth daily., Disp: , Rfl:  .  rosuvastatin (CRESTOR) 20 MG tablet, Take 1 tablet (20 mg total) by mouth daily., Disp: 30 tablet, Rfl: 11 .  senna (SENOKOT) 8.6 MG TABS tablet, Take 1 tablet by mouth., Disp: , Rfl:  .  SYMBICORT 80-4.5 MCG/ACT inhaler, 1 puff 2 (two) times daily., Disp: , Rfl:  .  Syringe/Needle, Disp, (SYRINGE 3CC/25GX1") 25G X 1" 3 ML MISC, Use for b12 injections, Disp: 50 each, Rfl: 0 .  topiramate (TOPAMAX) 50 MG tablet, TAKE 0.5 TABLET BY MOUTH NIGHTLY FOR 2 WEEKS, THEN INCREASE TO 1 TABLET NIGHTLY, Disp: , Rfl:  .  zolpidem (AMBIEN) 10 MG tablet, Take 1 tablet (10 mg total) by mouth at bedtime as needed for sleep., Disp: 30 tablet, Rfl: 5 .  amoxicillin-clavulanate (AUGMENTIN) 875-125 MG tablet, Take 1 tablet by mouth 2 (two) times daily., Disp: 14 tablet, Rfl: 0 .  benzonatate (TESSALON) 200 MG capsule, Take 1 capsule (200 mg total) by mouth 3 (three) times daily as needed for cough., Disp: 60 capsule, Rfl: 1 .  nitroGLYCERIN (NITROSTAT) 0.4 MG SL tablet, Place 1 tablet (0.4 mg total) under the tongue every 5 (five) minutes as needed for chest pain. Maximum of 3 doses. (Patient not taking: Reported on 08/20/2019), Disp: 35 tablet, Rfl: 3  EXAM:  VITALS per patient if applicable:  GENERAL: alert, oriented, appears well and in no acute distress  HEENT: atraumatic, conjunttiva clear, no obvious abnormalities on inspection of external nose and ears  NECK: normal movements of the head and neck  LUNGS: on inspection no signs of respiratory distress, breathing rate appears normal, no obvious gross SOB, gasping or wheezing  CV: no obvious cyanosis  MS: moves all visible extremities without noticeable abnormality  PSYCH/NEURO: pleasant and cooperative, no obvious depression or anxiety, speech and thought processing grossly intact  ASSESSMENT AND  PLAN:  Discussed the following assessment and plan:  COVID-19 virus infection  Acute non-recurrent frontal sinusitis  COVID-19 virus infection Diagnosed Jan 5,  Treated jan 8 for concurrent bacterial pneumonia (presumed).  Dyspnea, hypoxia have resolved.  Fatigue and cough still present.   Sinusitis, acute frontal augmentin and tessalon for cough,  Probiotic advised.  Afrin prn congestion and benadryl prn rhinitis     I discussed the assessment and treatment plan with the patient. The patient was provided an opportunity to ask questions and all were answered. The patient agreed with the plan and demonstrated an understanding of the instructions.   The patient was advised to call back or seek an in-person evaluation if the symptoms worsen or if the condition fails to improve as anticipated.  I provided  30 minutes of non-face-to-face time during this encounter reviewing patient's current problems and past procedures/imaging studies, providing counseling on the above mentioned problems , and coordination  of care .  [name], MD

## 2020-01-21 NOTE — Assessment & Plan Note (Signed)
augmentin and tessalon for cough,  Probiotic advised.  Afrin prn congestion and benadryl prn rhinitis

## 2020-01-21 NOTE — Telephone Encounter (Signed)
Spoke with pt and scheduled him for a virtual visit at 4:30pm.

## 2020-01-21 NOTE — Assessment & Plan Note (Signed)
Diagnosed Jan 5,  Treated jan 8 for concurrent bacterial pneumonia (presumed).  Dyspnea, hypoxia have resolved.  Fatigue and cough still present.

## 2020-02-06 ENCOUNTER — Ambulatory Visit (INDEPENDENT_AMBULATORY_CARE_PROVIDER_SITE_OTHER): Payer: 59 | Admitting: Internal Medicine

## 2020-02-06 ENCOUNTER — Encounter: Payer: Self-pay | Admitting: Internal Medicine

## 2020-02-06 ENCOUNTER — Other Ambulatory Visit: Payer: Self-pay

## 2020-02-06 VITALS — BP 118/80 | HR 71 | Ht 74.0 in | Wt 214.2 lb

## 2020-02-06 DIAGNOSIS — R079 Chest pain, unspecified: Secondary | ICD-10-CM

## 2020-02-06 DIAGNOSIS — Z8616 Personal history of COVID-19: Secondary | ICD-10-CM | POA: Diagnosis not present

## 2020-02-06 DIAGNOSIS — E785 Hyperlipidemia, unspecified: Secondary | ICD-10-CM

## 2020-02-06 DIAGNOSIS — I10 Essential (primary) hypertension: Secondary | ICD-10-CM

## 2020-02-06 DIAGNOSIS — R0602 Shortness of breath: Secondary | ICD-10-CM | POA: Diagnosis not present

## 2020-02-06 MED ORDER — FUROSEMIDE 20 MG PO TABS: 20.0000 mg | ORAL_TABLET | Freq: Every day | ORAL | 1 refills | Status: DC

## 2020-02-06 NOTE — Patient Instructions (Addendum)
Medication Instructions:  Your physician has recommended you make the following change in your medication:  1- START Furosemide 20 mg by mouth once a day. 2- ok to stop hydralazine.  *If you need a refill on your cardiac medications before your next appointment, please call your pharmacy*  Lab Work: none If you have labs (blood work) drawn today and your tests are completely normal, you will receive your results only by: [name] MyChart Message (if you have MyChart) OR . A paper copy in the mail If you have any lab test that is abnormal or we need to change your treatment, we will call you to review the results.  Testing/Procedures: Your physician has requested that you have an echocardiogram. Echocardiography is a painless test that uses sound waves to create images of your heart. It provides your doctor with information about the size and shape of your heart and how well your heart's chambers and valves are working. This procedure takes approximately one hour. There are no restrictions for this procedure. You may get an IV, if needed, to receive an ultrasound enhancing agent through to better visualize your heart.   Follow-Up: At [facility], you and your health needs are our priority.  As part of our continuing mission to provide you with exceptional heart care, we have created designated Provider Care Teams.  These Care Teams include your primary Cardiologist (physician) and Advanced Practice Providers (APPs -  Physician Assistants and Nurse Practitioners) who all work together to provide you with the care you need, when you need it.  Your next appointment:   1 month(s)  The format for your next appointment:   In Person  Provider:    You may see Nelva Bush, MD or one of the following Advanced Practice Providers on your designated Care Team:    Murray Hodgkins, NP  [name], PA-C Marrianne Mood, PA-C

## 2020-02-06 NOTE — Progress Notes (Signed)
Follow-up Outpatient Visit Date: 02/06/2020  Primary Care Provider: [name], MD [address] [place] [address]  Chief Complaint: Chest congestion and shortness of breath  HPI:  Brian Atkins is a 64 y.o. male with history of non-obstructive coronary artery disease by catheterization in 11/2017, hypertension, marginal zone B-cell lymphoma, and GERD status post Nissen fundoplication, who presents for follow-up of coronary artery disease.  He was last seen in our office in 11/2019 by [name], PA, for dizziness, anorexia, and nausea, and chest pain.  Blood pressures were also persistently elevated.  He subsequently underwent cardiac CTA that showed nonobstructive CAD.  Amlodipine was increased to 10 mg daily.  Hydralazine has also been added, though [name] never started this.  [name] contracted COVID-19 in early January and is still recovering from this.  He had cough, shortness of breath, chest congestion, fevers, and loss of taste/smell.  He continues to have some fatigue and dyspnea on exertion, as well as cough.  He is using albuterol twice a day with some relief.  He denies chest pain, palpitations, and lightheadedness, though he notes that he felt off balance when he was started on gabapentin recently for treatment of headaches.  He has been switched to Topamax with improvement in his balance.  He reports 2 pillow orthopnea since COVID-19 infection as well as a ~5 pound weight gain over the last month despite having a poor appetite.  --------------------------------------------------------------------------------------------------  Past Medical History:  Diagnosis Date  . Complication of anesthesia    slow to wake  . Coronary artery disease, non-occlusive    a. LHC 12/18: ostLAD 20%, p/mLAD 60% FFR 0.84, ost ramus 50%, mRCA 50% FFR 0.94, EF 65%  . Diverticulitis   . Diverticulosis   . Dyslipidemia   . Family history of adverse reaction to anesthesia    Mother  - slow to wake  . GERD (gastroesophageal reflux disease)   . Hemorrhoids   . Hiatal hernia   . History of echocardiogram    a. TTE 1/19: EF of 60-65%, normal wall motion, normal LV diastolic function, mildly dilated LA  . Labile hypertension   . Lymphoma ([place])   . Medial epicondylitis of right elbow   . Meralgia paresthetica of right side   . Microscopic hematuria   . Ocular migraine   . Sleep apnea    CPAP   Past Surgical History:  Procedure Laterality Date  . INTRAVASCULAR PRESSURE WIRE/FFR STUDY N/A 12/07/2017   Procedure: INTRAVASCULAR PRESSURE WIRE/FFR STUDY;  Surgeon: Nelva Bush, MD;  Location: [facility];  Service: Cardiovascular;  Laterality: N/A;  . LEFT HEART CATH AND CORONARY ANGIOGRAPHY Left 12/07/2017   Procedure: LEFT HEART CATH AND CORONARY ANGIOGRAPHY;  Surgeon: Nelva Bush, MD;  Location: [facility];  Service: Cardiovascular;  Laterality: Left;  . nissen funduplication  [id number]   Brian Atkins  . SEPTOPLASTY Bilateral 08/23/2019   Procedure: SEPTOPLASTY;  Surgeon: [name], MD;  Location: [facility];  Service: ENT;  Laterality: Bilateral;  . SPLENECTOMY    . TURBINATE REDUCTION Bilateral 08/23/2019   Procedure: TURBINATE REDUCTION;  Surgeon: [name], MD;  Location: [facility];  Service: ENT;  Laterality: Bilateral;  Latex sensitivity sleep apnea    Current Meds  Medication Sig  . amitriptyline (ELAVIL) 10 MG tablet Take 10 mg by mouth at bedtime.  [name] amLODipine (NORVASC) 10 MG tablet Take 1 tablet (10 mg total) by mouth daily.  . benzonatate (  TESSALON) 200 MG capsule Take 1 capsule (200 mg total) by mouth 3 (three) times daily as needed for cough.  . carvedilol (COREG) 25 MG tablet TAKE 1 TABLET BY MOUTH TWICE A DAY  . cyanocobalamin (,VITAMIN B-12,) 1000 MCG/ML injection INJECT 1 ML INTO THE MUSCLE DAILY FOR 3 DAYS, THEN WEEKLY FOR 4 WEEKS , THEN MONTHLY THEREAFTER  . dexlansoprazole (DEXILANT) 60 MG capsule  Take 60 mg by mouth 2 (two) times daily.  [name] ezetimibe (ZETIA) 10 MG tablet Take 1 tablet (10 mg total) by mouth daily.  . Galcanezumab-gnlm (EMGALITY) 120 MG/ML SOAJ Inject 120 mg into the skin every 30 (thirty) days.  [name] guaiFENesin-codeine (CHERATUSSIN AC) 100-10 MG/5ML syrup Take 5 mLs by mouth 3 (three) times daily as needed for cough.  [name] LORazepam (ATIVAN) 1 MG tablet Take 1 tablet (1 mg total) by mouth every 8 (eight) hours as needed.  [name] losartan (COZAAR) 100 MG tablet TAKE 1 TABLET BY MOUTH EVERY DAY  . nitroGLYCERIN (NITROSTAT) 0.4 MG SL tablet Place 1 tablet (0.4 mg total) under the tongue every 5 (five) minutes as needed for chest pain. Maximum of 3 doses.  Glory Rosebush VERIO test strip USE TO CHECK BLOOD SUGAR AS NEEDED  . prochlorperazine (COMPAZINE) 5 MG tablet Take 5 mg by mouth every 6 (six) hours as needed for nausea or vomiting.  . Prucalopride Succinate (MOTEGRITY) 2 MG TABS Take 1 tablet by mouth daily.  . rosuvastatin (CRESTOR) 20 MG tablet Take 1 tablet (20 mg total) by mouth daily.  [name] senna (SENOKOT) 8.6 MG TABS tablet Take 1 tablet by mouth.  . SYMBICORT 80-4.5 MCG/ACT inhaler 1 puff 2 (two) times daily.  . Syringe/Needle, Disp, (SYRINGE 3CC/25GX1") 25G X 1" 3 ML MISC Use for b12 injections  . topiramate (TOPAMAX) 50 MG tablet TAKE 0.5 TABLET BY MOUTH NIGHTLY FOR 2 WEEKS, THEN INCREASE TO 1 TABLET NIGHTLY  . zolpidem (AMBIEN) 10 MG tablet Take 1 tablet (10 mg total) by mouth at bedtime as needed for sleep.    Allergies: Avelox [moxifloxacin], Contrast media [iodinated diagnostic agents], Crestor [rosuvastatin calcium], Morphine and related, Niacin-simvastatin er, Latex, and Tape  Social History   Tobacco Use  . Smoking status: Never Smoker  . Smokeless tobacco: Never Used  Substance Use Topics  . Alcohol use: No  . Drug use: No    Family History  Problem Relation Age of Onset  . Coronary artery disease Father 84       CABG  . Aortic aneurysm Father 56        repaired  . Hyperlipidemia Father   . Lung cancer Mother   . Hypertension Mother   . Cancer Mother        bladder  . COPD Mother   . Stomach cancer Maternal Grandfather     Review of Systems: A 12-system review of systems was performed and was negative except as noted in the HPI.  --------------------------------------------------------------------------------------------------  Physical Exam: BP 118/80 (BP Location: Left Arm, Patient Position: Sitting, Cuff Size: Normal)   Pulse 71   Ht 6\' 2"  (1.88 m)   Wt 214 lb 4 oz (97.2 kg)   SpO2 97%   BMI 27.51 kg/m   General:  NAD. Neck: No JVD or HJR. Lungs: Coarse breath sounds without wheezes or crackles.  Normal work of breathing. Heart: Regular rate and rhythm without murmurs, rubs, or gallops. Non-displaced PMI. Abd: Bowel sounds present. Soft, NT/ND without hepatosplenomegaly Ext: 1+ pretibial edema bilaterally. Skin: Warm and  dry without rash.  EKG:  NSR without abnormality.  Lab Results  Component Value Date   WBC 7.2 07/20/2018   HGB 15.2 07/20/2018   HCT 44.7 07/20/2018   MCV 90.7 07/20/2018   PLT 420.0 (H) 07/20/2018    Lab Results  Component Value Date   NA 140 11/28/2019   K 3.9 11/28/2019   CL 104 11/28/2019   CO2 26 11/28/2019   BUN 18 11/28/2019   CREATININE 1.17 11/28/2019   GLUCOSE 139 (H) 11/28/2019   ALT 21 11/28/2019    Lab Results  Component Value Date   CHOL 190 11/28/2019   HDL 40 (L) 11/28/2019   LDLCALC 127 (H) 11/28/2019   LDLDIRECT 135.8 03/02/2013   TRIG 114 11/28/2019   CHOLHDL 4.8 11/28/2019    --------------------------------------------------------------------------------------------------  ASSESSMENT AND PLAN: Shortness of breath, edema, and recent COVID-19 infection: I suspect symptoms are largely due to recent COVID-19 infection.  However, edema, weight gain, and orthopnea raise the potential for developing heart failure.  I have recommended that we obtain an  echocardiogram to evaluate for new cardiomyopathy as well as start furosemide 20 mg daily.  Hypertension: Blood pressure is reasonable today in spite of Mr. Huhta never having started hydralazine.  I think it is reasonable to forego addition of hydralazine at his time.  We will continue current regimen of carvedilol, amlodipine, and losartan.  Chest pain: Improved from prior visits, with cath and recent cardiac CTA demonstrating non-obstructive CAD.  No further workup is recommended at this time.  We will continue with blood pressure control and lipid therapy to prevent progression of disease.  Hyperlipidemia: Continue rosuvastatin and ezetimibe.  We will need to recheck a lipid panel in ~1 month to ensure appropriate response (most recent LDL was 127 in 11/2019).  Follow-up: Return to clinic in 1 month.  Nelva Bush, MD 02/06/2020 3:00 PM

## 2020-02-08 ENCOUNTER — Encounter: Payer: Self-pay | Admitting: Internal Medicine

## 2020-02-08 ENCOUNTER — Telehealth: Payer: Self-pay | Admitting: Internal Medicine

## 2020-02-08 DIAGNOSIS — Z8616 Personal history of COVID-19: Secondary | ICD-10-CM | POA: Insufficient documentation

## 2020-02-08 NOTE — Telephone Encounter (Signed)
Pt c/o swelling: STAT is pt has developed SOB within 24 hours  1) How much weight have you gained and in what time span? States on Wednesdays appt patient had gained 6 pounds  2) If swelling, where is the swelling located? In feet and traveling up ankles  3) Are you currently taking a fluid pill? Started today  4) Are you currently SOB? Yes but believes it is do to recovering from Covid  5) Do you have a log of your daily weights (if so, list)? no  6) Have you gained 3 pounds in a day or 5 pounds in a week? no  7) Have you traveled recently? no  Pt c/o medication issue:  1. Name of Medication: furosemide 20 mg   2. How are you currently taking this medication (dosage and times per day)? 20 mg am (first dose)  3. Are you having a reaction (difficulty breathing--STAT)?   4. What is your medication issue? Wondering if medication takes time to kick in, swelling has been worse today   Please advise

## 2020-02-08 NOTE — Telephone Encounter (Signed)
Returned call to patient.   He has c/o worsening LE edema. Just started lasix this morning d/t weather and does not have his current weight.   Pt noted that SOB is about the same as when he was seen by [name]. Pt speaks in full sentences and denies needing more elevation to sleep. SOB is assoc with exertion.   I requested that patient begin taking daily weights each morning. Education provided on morning weights.  I advised that patient continue medication and daily weights and give the office a call back on Monday afternoon for update.   I encouraged him to elevate legs during the day when possible and buy compression socks to wear while working. He works at [employer] and is on his feet a lot during the day. Pt agrees to decrease salt intake.   Pt agrees to seek urgent medical care for any new or worsening sx.   Advised pt to call for any further questions or concerns.

## 2020-02-08 NOTE — Telephone Encounter (Signed)
Pt saw [name] on 02/06/2020. He had the pt start Furosemide 20 mg once daily for bilateral lower leg edema. The pt called and stated that he is still having bilateral lower leg swelling. Pt was advised to call Cardiology since [name] prescribed medication.

## 2020-02-08 NOTE — Telephone Encounter (Signed)
Pt called wanting to speak to Janett Billow about a fluid pill that Cardiology put him on and he is still having leg swelling.

## 2020-02-11 NOTE — Telephone Encounter (Signed)
Weight Log   Saturday  206.6  Sunday 204.8  Monday 205.0

## 2020-02-11 NOTE — Telephone Encounter (Signed)
lmov to schedule sooner .

## 2020-02-11 NOTE — Telephone Encounter (Signed)
Spoke with patient to get update on symptoms. Reports he is still dizzy and lightheaded at times. Feels the lower extremity swelling has improved only slightly. Still shortness of breath with exertion. Weights at home: Saturday 206.6    Sunday  204.8    Monday 205  Unfortunately, he does not know how much he weighed at home prior to now so it is hard to compare with his office visit weight (he reports wearing his uniform when weighed at the office.) BP at home 140-146/85-90, HR 70-75.  He has been taking furosemide 20 mg daily since 2/17 office visit with Dr Saunders Revel. Echo is scheduled for 3/31 (I have asked scheduling to put him on the waiting list). Routing to Dr End for review.

## 2020-02-12 ENCOUNTER — Ambulatory Visit (INDEPENDENT_AMBULATORY_CARE_PROVIDER_SITE_OTHER): Payer: 59

## 2020-02-12 ENCOUNTER — Other Ambulatory Visit: Payer: Self-pay

## 2020-02-12 DIAGNOSIS — R0602 Shortness of breath: Secondary | ICD-10-CM | POA: Diagnosis not present

## 2020-02-12 DIAGNOSIS — Z8616 Personal history of COVID-19: Secondary | ICD-10-CM

## 2020-02-18 ENCOUNTER — Other Ambulatory Visit: Payer: Self-pay

## 2020-02-18 MED ORDER — CARVEDILOL 25 MG PO TABS: 25.0000 mg | ORAL_TABLET | Freq: Two times a day (BID) | ORAL | 2 refills | Status: DC

## 2020-02-19 ENCOUNTER — Telehealth: Payer: Self-pay | Admitting: Internal Medicine

## 2020-02-19 ENCOUNTER — Ambulatory Visit (INDEPENDENT_AMBULATORY_CARE_PROVIDER_SITE_OTHER): Payer: 59

## 2020-02-19 ENCOUNTER — Other Ambulatory Visit: Payer: Self-pay

## 2020-02-19 ENCOUNTER — Ambulatory Visit
Admission: EM | Admit: 2020-02-19 | Discharge: 2020-02-19 | Disposition: A | Discharge status: Home / Self Care | Payer: 59 | Attending: Family Medicine | Admitting: Family Medicine

## 2020-02-19 ENCOUNTER — Encounter: Payer: Self-pay | Admitting: Emergency Medicine

## 2020-02-19 DIAGNOSIS — R05 Cough: Secondary | ICD-10-CM

## 2020-02-19 DIAGNOSIS — R042 Hemoptysis: Secondary | ICD-10-CM

## 2020-02-19 DIAGNOSIS — G9331 Postviral fatigue syndrome: Secondary | ICD-10-CM

## 2020-02-19 DIAGNOSIS — Z8616 Personal history of COVID-19: Secondary | ICD-10-CM

## 2020-02-19 DIAGNOSIS — G933 Postviral fatigue syndrome: Secondary | ICD-10-CM

## 2020-02-19 DIAGNOSIS — R059 Cough, unspecified: Secondary | ICD-10-CM

## 2020-02-19 NOTE — ED Triage Notes (Signed)
Patient c/o coughing up blood that started 1.5 weeks ago. He states he had COVID in January and has continued to have shortness of breath, fatigue and cough. He is requesting a chest xray. He has been taking Tessalon and an inhaler for his cough.

## 2020-02-19 NOTE — Telephone Encounter (Signed)
Called to check on patient no answer received voicemail no answer left message if I could help to please call office that patient needs to be evaluated today,

## 2020-02-19 NOTE — Telephone Encounter (Signed)
Agree with advice.  Needs urgent  evaluation at a facility that has a CT scan

## 2020-02-19 NOTE — Telephone Encounter (Signed)
Patient's wife called to let office know patient has a 2pm appointment at  [facility] today.

## 2020-02-19 NOTE — Telephone Encounter (Signed)
Agree with need for evaluation now.  Agree if coughing up blood, needs ER evaluation.  Please forward to Dr Derrel Nip as well.

## 2020-02-19 NOTE — Discharge Instructions (Addendum)
It was very nice seeing you today in clinic. Thank you for entrusting me with your care.   Cough may persistent for quite sometime following SARS-CoV-2 (novel coronavirus). I am concerned about the hemoptysis (blood in sputum). I think you need to see a specialist about this. I have referred you to a pulmonologist. They should call you for an appointment.   If your symptoms/condition worsens, please seek follow up care either here or in the ER. Please remember, our [facility] providers are "right here with you" when you need [place].   Again, it was my pleasure to take care of you today. Thank you for choosing our clinic. I hope that you start to feel better quickly.   Honor Loh, MSN, APRN, FNP-C, CEN Advanced Practice Provider [facility]

## 2020-02-19 NOTE — Telephone Encounter (Signed)
Patient coughing up blood little, red in color, bright Red still short of breath especially at night, Access Nurse had advised UC or ER patient had refused , advised patient PCP out of office this morning and that patient needs evaluation now if bright red blood and with SOB symptoms I cannot bring into office, NO appointments available,  patient refused ER so I suggested [facility] they have on site X-ray and CT available to go there , patient wife agreed she would get patient to go to [facility].

## 2020-02-19 NOTE — Telephone Encounter (Signed)
Spoke with [facility] and advised patient needs evaluation, Patient had called back and ask orders to be put in for X-ray , but I advised he needs to seen and be evaluated by an MD this morning coughing up bright red blood.

## 2020-02-19 NOTE — Telephone Encounter (Signed)
Sent to Access Nurse  Pt wife called stating that pt is coughing up blood and that he had covid back in Jan and it just lingering from that

## 2020-02-19 NOTE — Telephone Encounter (Signed)
Appointment verified

## 2020-02-20 ENCOUNTER — Encounter: Payer: Self-pay | Admitting: Urgent Care

## 2020-02-20 NOTE — ED Provider Notes (Signed)
[place], [place]   Name: Brian Atkins DOB: 08-01-1956 MRN: [id number] CSN: [id number] PCP: [name], MD  Arrival date and time:  02/19/20 1354  Chief Complaint:  Hemoptysis and Shortness of Breath   NOTE: Prior to seeing the patient today, I have reviewed the triage nursing documentation and vital signs. Clinical staff has updated patient's PMH/PSHx, current medication list, and drug allergies/intolerances to ensure comprehensive history available to assist in medical decision making.   History:   HPI: Brian Atkins is a 64 y.o. male who presents today with complaints of continued cough, shortness of breath, and fatigue following his diagnosis of SARS-CoV-2 (novel coronavirus) in early 12/2019. Patient states, "I feel as bad as I did when I had COVID. I just don't have the fevers". Over the course of the last 7-10 days, patient has experienced episodes of hemoptysis. He notes that his sputum in the morning is "black" in color. Throughout the day, sputum clears to some degree, however there is a fair amount of bright red blood. Patient denies associated chest pain. Patient reporting that he does not feel like his breathing is back to his pre-COVID baseline. Patient describes episodes of breathlessness both at rest and with exertion. He has been prescribed a SABA (albuterol) and ICS + LABA (Symbicort) MDIs, which he notes he uses on a daily basis. Additionally, patient has been using benzonatate that was prescribed by his PCP, which he advises does not provide a great deal of perceived benefit. For this reason, patient notes that he has not used this medication in a few days. Patient also infrequently using guaifenesin-codeine syrup, however he indicates that he does not like to use this medication because of the associated side effects (sonolence). Patient presents today advising that his PCP advised him to come in for a clinic visit and radiographs of his chest.   EMR reviewed. Patient has  discussed his breathing and cough with his cardiologist. Patient was subsequently started on loop diuretic (furosemide) therapy. Additionally, patient was sent for an echocardiogram. Echocardiogram results reviewed. EF 60-65% with no RWMAs; mild LVH consistent with grade I diastolic dysfunction.    Past Medical History:  Diagnosis Date  . Complication of anesthesia    slow to wake  . Coronary artery disease, non-occlusive    a. LHC 12/18: ostLAD 20%, p/mLAD 60% FFR 0.84, ost ramus 50%, mRCA 50% FFR 0.94, EF 65%  . Diverticulitis   . Diverticulosis   . Dyslipidemia   . Family history of adverse reaction to anesthesia    Mother - slow to wake  . GERD (gastroesophageal reflux disease)   . Hemorrhoids   . Hiatal hernia   . History of 2019 novel coronavirus disease (COVID-19) 12/24/2019  . History of echocardiogram    a. TTE 1/19: EF of 60-65%, normal wall motion, normal LV diastolic function, mildly dilated LA  . Labile hypertension   . Lymphoma ([place])   . Medial epicondylitis of right elbow   . Meralgia paresthetica of right side   . Microscopic hematuria   . Ocular migraine   . Sleep apnea    CPAP    Past Surgical History:  Procedure Laterality Date  . INTRAVASCULAR PRESSURE WIRE/FFR STUDY N/A 12/07/2017   Procedure: INTRAVASCULAR PRESSURE WIRE/FFR STUDY;  Surgeon: Nelva Bush, MD;  Location: [facility];  Service: Cardiovascular;  Laterality: N/A;  . LEFT HEART CATH AND CORONARY ANGIOGRAPHY Left 12/07/2017   Procedure: LEFT HEART CATH AND CORONARY ANGIOGRAPHY;  Surgeon: End,  Harrell Gave, MD;  Location: [facility];  Service: Cardiovascular;  Laterality: Left;  . nissen funduplication  [id number]   [name]  . SEPTOPLASTY Bilateral 08/23/2019   Procedure: SEPTOPLASTY;  Surgeon: [name], MD;  Location: [facility];  Service: ENT;  Laterality: Bilateral;  . SPLENECTOMY    . TURBINATE REDUCTION Bilateral 08/23/2019   Procedure: TURBINATE REDUCTION;   Surgeon: [name], MD;  Location: [facility];  Service: ENT;  Laterality: Bilateral;  Latex sensitivity sleep apnea    Family History  Problem Relation Age of Onset  . Coronary artery disease Father 65       CABG  . Aortic aneurysm Father 77       repaired  . Hyperlipidemia Father   . Lung cancer Mother   . Hypertension Mother   . Cancer Mother        bladder  . COPD Mother   . Stomach cancer Maternal Grandfather     Social History   Tobacco Use  . Smoking status: Never Smoker  . Smokeless tobacco: Never Used  Substance Use Topics  . Alcohol use: No  . Drug use: No    Patient Active Problem List   Diagnosis Date Noted  . History of COVID-19 02/08/2020  . Sinusitis, acute frontal 01/21/2020  . COVID-19 virus infection 12/28/2019  . Prediabetes 03/14/2019  . New persistent daily headache 03/14/2019  . Otitis externa of left ear 03/14/2019  . Hospital discharge follow-up 07/22/2018  . B12 deficiency 03/15/2018  . Numbness and tingling of upper and lower extremities of both sides 03/15/2018  . Shortness of breath 12/01/2017  . Palpitations 12/01/2017  . Chest pain of uncertain etiology [id number]  . Paroxysmal supraventricular tachycardia ([place]) 11/22/2017  . URI (upper respiratory infection) 11/01/2017  . Rash 11/01/2017  . 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 Fatigue 02/20/2017  . Slow transit constipation 02/20/2017  . 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 Post-nasal drip 09/30/2015  . Hx of cerebral venous sinus thrombosis 05/18/2015  . Hx antineoplastic chemotherapy 01/19/2015  . 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 Herpes zoster 11/07/2014  . 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 Abdominal pain 05/17/2014  . 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 Routine general medical examination at a health care facility  03/04/2013  . Special screening for malignant neoplasm of prostate 12/07/2011  . Screening for colon cancer 12/07/2011  . Hemorrhoids   . Sleep apnea 10/01/2008  . Hyperlipidemia LDL goal <70 09/26/2008  . Essential hypertension 09/26/2008  . GERD 09/26/2008    Home Medications:    Current Meds  Medication Sig  . amLODipine (NORVASC) 10 MG tablet Take 1 tablet (10 mg total) by mouth daily.  . benzonatate (TESSALON) 200 MG capsule Take 1 capsule (200 mg total) by mouth 3 (three) times daily as needed for cough.  . carvedilol (COREG) 25 MG tablet Take 1 tablet (25 mg total) by mouth 2 (two) times daily.  . cyanocobalamin (,VITAMIN B-12,) 1000 MCG/ML injection INJECT 1 ML INTO THE MUSCLE DAILY FOR 3 DAYS, THEN WEEKLY FOR 4 WEEKS , THEN MONTHLY THEREAFTER  . dexlansoprazole (DEXILANT) 60 MG capsule Take 60 mg by mouth 2 (two) times daily.  [name] ezetimibe (ZETIA) 10 MG tablet Take 1 tablet (10 mg total) by mouth daily.  . furosemide (LASIX) 20 MG tablet Take 1 tablet (20 mg total) by mouth  daily.  . Galcanezumab-gnlm (EMGALITY) 120 MG/ML SOAJ Inject 120 mg into the skin every 30 (thirty) days.  [name] LORazepam (ATIVAN) 1 MG tablet Take 1 tablet (1 mg total) by mouth every 8 (eight) hours as needed.  [name] losartan (COZAAR) 100 MG tablet TAKE 1 TABLET BY MOUTH EVERY DAY  . prochlorperazine (COMPAZINE) 5 MG tablet Take 5 mg by mouth every 6 (six) hours as needed for nausea or vomiting.  . Prucalopride Succinate (MOTEGRITY) 2 MG TABS Take 1 tablet by mouth daily.  . rosuvastatin (CRESTOR) 20 MG tablet Take 1 tablet (20 mg total) by mouth daily.  . SYMBICORT 80-4.5 MCG/ACT inhaler 1 puff 2 (two) times daily.  . Syringe/Needle, Disp, (SYRINGE 3CC/25GX1") 25G X 1" 3 ML MISC Use for b12 injections  . zolpidem (AMBIEN) 10 MG tablet Take 1 tablet (10 mg total) by mouth at bedtime as needed for sleep.    Allergies:   Avelox [moxifloxacin], Contrast media [iodinated diagnostic agents], Crestor [rosuvastatin  calcium], Morphine and related, Niacin-simvastatin er, Latex, and Tape  Review of Systems (ROS):  Review of systems NEGATIVE unless otherwise noted in narrative H&P section.   Vital Signs: Today's Vitals   02/19/20 1449 02/19/20 1453 02/19/20 1553  BP:  (!) 139/92   Pulse:  70   Resp:  18   Temp:  98.4 F (36.9 C)   TempSrc:  Oral   SpO2:  96%   Weight: 204 lb (92.5 kg)    Height: 6\' 2"  (1.88 m)    PainSc: 0-No pain  0-No pain    Physical Exam: Physical Exam  Constitutional: He is oriented to person, place, and time and well-developed, well-nourished, and in no distress.  HENT:  Head: Normocephalic and atraumatic.  Nose: Nose normal.  Mouth/Throat: Uvula is midline, oropharynx is clear and moist and mucous membranes are normal.  Eyes: Pupils are equal, round, and reactive to light.  Neck: No tracheal deviation present.  Cardiovascular: Normal rate, regular rhythm, normal heart sounds and intact distal pulses.  Pulmonary/Chest: Effort normal. He has rhonchi (coarse in upper airways; clears mostly with cough).  Mild to moderate cough noted in clinic. No SOB or increased WOB. No hemoptysis noted. No respiratory distress. Able to speak in complete sentences without difficulties. SPO2 96% on RA.  Musculoskeletal:     Cervical back: Normal range of motion and neck supple.  Lymphadenopathy:    He has no cervical adenopathy.  Neurological: He is alert and oriented to person, place, and time. Gait normal.  Skin: Skin is warm and dry. No rash noted. He is not diaphoretic.  Psychiatric: Mood, memory, affect and judgment normal.  Nursing note and vitals reviewed.   Urgent Care Treatments / Results:   Orders Placed This Encounter  Procedures  . DG Chest 2 View  . Ambulatory referral to Pulmonology    LABS: PLEASE NOTE: all labs that were ordered this encounter are listed, however only abnormal results are displayed. Labs Reviewed - No data to  display  EKG: -None  RADIOLOGY: DG Chest 2 View  Result Date: 02/19/2020 CLINICAL DATA:  Lymphoma for 5 years ago.  Cough. EXAM: CHEST - 2 VIEW COMPARISON:  10/23/2018 FINDINGS: There is a linear band of airspace disease at the left lung base which may reflect atelectasis versus scarring. There is no focal consolidation. There is no pleural effusion or pneumothorax. The heart and mediastinal contours are unremarkable. There is no acute osseous abnormality. IMPRESSION: No active cardiopulmonary disease. Electronically Signed  By: Kathreen Devoid   On: 02/19/2020 15:29    PROCEDURES: Procedures  MEDICATIONS RECEIVED THIS VISIT: Medications - No data to display  PERTINENT CLINICAL COURSE NOTES/UPDATES:   Initial Impression / Assessment and Plan / Urgent Care Course:  Pertinent labs & imaging results that were available during my care of the patient were personally reviewed by me and considered in my medical decision making (see lab/imaging section of note for values and interpretations).  Brian Atkins is a 64 y.o. male who presents to [facility] today with complaints of Hemoptysis and Shortness of Breath  Patient is well appearing overall in clinic today. He does not appear to be in any acute distress. Presenting symptoms (see HPI) and exam as documented above. Patient with continued symptoms since being diagnosed with SARS-CoV-2 in January 2021. (+) hemoptysis x 7-10 days. Radiographs of the chest performed today revealed no acute cardiopulmonary process; no evidence of peribronchial thickening, areas of consolidation, or focal infiltrates. There was an area of linear banding in the LLL felt to represent atelectasis vs. scarring.   Discussed negative CXR with patient. Wells criteria and Geneva score place patient in low risk category for pulmonary embolism. Exam is reassuring with no hypoxia or tachycardia. Will defer CT imaging of the chest today as there is no clear indication.  Discussed that cough, fatigue, and shortness of breath are likely multifactorial and related to his recent SARS-CoV-2 infection and underlying COPD. Discussed post-viral syndrome can persist for months after the inciting event/infection. With that being said, patient has a history of MALT lymphoma; denies B symptoms. With the chronicity of his cough, and newly developed hemoptysis, his symptoms warrant further evaluation by PCCM. Referral entered to [facility] pulmonology; followed up with office to ensure receipt. Patient was advised that someone who be contacting him today or early tomorrow to get him scheduled to see the lung specialist. Communicated with PCP's office (spoke with Rosana Hoes, RN) to make them aware of the plans for PCCM referral. In the interim, patient to continue MDIs and anti-tussive medications as previously prescribed.   I have reviewed the follow up and strict return precautions for any new or worsening symptoms. Patient is aware of symptoms that would be deemed urgent/emergent, and would thus require further evaluation either here or in the emergency department. At the time of discharge, he verbalized understanding and consent with the discharge plan as it was reviewed with him. All questions were fielded by provider and/or clinic staff prior to patient discharge.    Final Clinical Impressions / Urgent Care Diagnoses:   Final diagnoses:  Hemoptysis  Cough  Post viral syndrome  History of 2019 novel coronavirus disease (COVID-19)    New Prescriptions:  [place] Controlled Substance Registry consulted? Not Applicable  No orders of the defined types were placed in this encounter.   Recommended Follow up Care:  Patient encouraged to follow up with the following provider within the specified time frame, or sooner as dictated by the severity of his symptoms. As always, he was instructed that for any urgent/emergent care needs, he should seek care either here or in the emergency department  for more immediate evaluation.  Follow-up Information    Flora Lipps, MD.   Specialties: Pulmonary Disease, Cardiology Why: Someone should call you to schedule an appointment. Contact information: [place] [address] [phone]         NOTE: This note was prepared using Dragon dictation software along with smaller  Company secretary. Despite my best ability to proofread, there is the potential that transcriptional errors may still occur from this process, and are completely unintentional.    Karen Kitchens, NP 02/20/20 0107

## 2020-02-26 ENCOUNTER — Other Ambulatory Visit: Payer: Self-pay | Admitting: Internal Medicine

## 2020-02-28 NOTE — Telephone Encounter (Signed)
Refill request for Ambien, last seen 01-21-20, last filled 08-19-19.  Please advise.

## 2020-03-02 ENCOUNTER — Other Ambulatory Visit: Payer: Self-pay | Admitting: Internal Medicine

## 2020-03-03 ENCOUNTER — Telehealth: Payer: Self-pay | Admitting: Internal Medicine

## 2020-03-03 DIAGNOSIS — E785 Hyperlipidemia, unspecified: Secondary | ICD-10-CM

## 2020-03-03 DIAGNOSIS — R7303 Prediabetes: Secondary | ICD-10-CM

## 2020-03-03 DIAGNOSIS — I10 Essential (primary) hypertension: Secondary | ICD-10-CM

## 2020-03-03 DIAGNOSIS — Z1211 Encounter for screening for malignant neoplasm of colon: Secondary | ICD-10-CM

## 2020-03-03 NOTE — Telephone Encounter (Signed)
Patient states the Lasix is not helping, states his legs and feet are still swelling. Pt c/o swelling: STAT is pt has developed SOB within 24 hours  1) How much weight have you gained and in what time span? Not sure  2) If swelling, where is the swelling located? Bilateral feel and legs up to knees  3) Are you currently taking a fluid pill? Yes, Lasix  4) Are you currently SOB? yes  5) Do you have a log of your daily weights (if so, list)? No log  6) Have you gained 3 pounds in a day or 5 pounds in a week? Not sure  7) Have you traveled recently? no

## 2020-03-03 NOTE — Telephone Encounter (Signed)
Spoke with pt and he stated that he started having bilateral leg swelling, and tingling with some difficulty walking. Pt stated that he does have some SOBr but has had it since he was diagnosed with covid and no chest pain. Spoke with Dr. Derrel Nip and she stated that she would like for pt to increase the lasix to 40 mg. Pt was advised and he stated that he would start in the morning because he does not want to be up all night going to the bathroom. Pt was advised that if he developed any SOBr or chest pain that he should go to the ED. Pt gave a verbal understanding. Dr. Derrel Nip also stated that pt should come in office on Monday to have some lab work done since he is increasing his lasix.

## 2020-03-03 NOTE — Telephone Encounter (Signed)
Pt's wife called and states that pt called her from work and he says that his legs are swollen and it is hard to walk. She states that he has already talked with his cardiologist and they told him to increase lasix. Pt does not want to do that and wants to speak to PCP for second opinion. Please call pt back [phone]

## 2020-03-03 NOTE — Telephone Encounter (Signed)
Spoke with patient.  States his feet and legs are still swollen despite starting the furosemide 20 mg daily on 02/06/20 after OV. Usually swelling will go down after elevation of lower extremities though lately not as well. Has not been monitoring weight or BP. Feet up to his knee feels tingly. SOB with exertion. Patient reports he stands all day at his job.  Advised to take an extra furosemide 20 mg today, wear compression stockings and begin to monitor weight in the morning daily upon waking. He verbalized understanding and will continue to monitor over the next few days and let us know if no improvement.

## 2020-03-03 NOTE — Telephone Encounter (Signed)
bmet ordered 

## 2020-03-04 MED ORDER — AMLODIPINE BESYLATE 10 MG PO TABS: 5.0000 mg | ORAL_TABLET | Freq: Every day | ORAL | 2 refills | Status: DC

## 2020-03-04 NOTE — Telephone Encounter (Signed)
Spoke with patient. He verbalized understanding to decrease amlodipine to 5 mg (0.5 tablet) by mouth once a day. He was appreciative and will continue to monitor BP and let us know if greater than 140/90. Med list updated.

## 2020-03-04 NOTE — Addendum Note (Signed)
Addended by: [name] on: 03/04/2020 05:37 PM   Modules accepted: Orders

## 2020-03-04 NOTE — Telephone Encounter (Signed)
Fasting labs ordered .  3 hour fast ok as long as he is taking his crestor and zetia

## 2020-03-04 NOTE — Telephone Encounter (Signed)
Pt is scheduled on Monday to have the bmet drawn. Pt wanted to know if you could go ahead and order whatever he is due for. He stated that he thought he was behind on lab work.

## 2020-03-04 NOTE — Telephone Encounter (Signed)
Let's have Mr. Faber cut his amlodipine down to 5 mg daily, as this could be contributing to his edema.  He should monitor his BP and let us know if it is consistently above 140/90.  Nelva Bush, MD [facility]

## 2020-03-04 NOTE — Telephone Encounter (Signed)
Patient returning missed call  Please advise when able

## 2020-03-04 NOTE — Telephone Encounter (Signed)
No answer/VM full.

## 2020-03-06 ENCOUNTER — Ambulatory Visit: Payer: 59 | Admitting: Internal Medicine

## 2020-03-06 ENCOUNTER — Other Ambulatory Visit: Payer: Self-pay

## 2020-03-06 ENCOUNTER — Encounter: Payer: Self-pay | Admitting: Internal Medicine

## 2020-03-06 VITALS — BP 126/80 | HR 76 | Ht 74.0 in | Wt 210.0 lb

## 2020-03-06 DIAGNOSIS — U071 COVID-19: Secondary | ICD-10-CM

## 2020-03-06 DIAGNOSIS — R0602 Shortness of breath: Secondary | ICD-10-CM | POA: Diagnosis not present

## 2020-03-06 MED ORDER — SYMBICORT 80-4.5 MCG/ACT IN AERO: 2.0000 | INHALATION_SPRAY | Freq: Two times a day (BID) | RESPIRATORY_TRACT | 5 refills | Status: DC

## 2020-03-06 NOTE — Telephone Encounter (Signed)
Spoke with pt and he is aware that he needs to be fasting for at least 3 hours before his lab work on Monday. Pt gave a verbal understanding.

## 2020-03-06 NOTE — Progress Notes (Signed)
Brian Atkins    WD:[id number]    [birth year]  Primary Care Physician:Tullo, Aris Everts, MD  Referring Physician: [name], MD [address],  [place] [address] Reason for Consultation: shortness of breath after covid Date of Consultation: 03/06/2020  Chief complaint:   Chief Complaint  Patient presents with  . Pulmonary Consult     HPI: History of Lymphoma s/p radiation therapy. 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  Diagnosed with COVID 19 on Jan 5th. Treated on Jan 8th for bacterial pneumonia as well.  Is on ICS-LABA with prn albuterol as well as benzonatate.  Having LE edema dn started on lasix by cardiology.  Had episode of hemoptysis two weeks ago, seen in urgent care - dime sized amount of bright red blood (now resolved). Low wells score, perc negative. Chest xray reassuring.  Fatigued, chills, decreased appetite, trouble sleeping. Lower extremity edema, paresthesias.  Had a sleep study with ENT - moderate to severe sleep apnea - has a machine at home but isn't able to use it nightly - not used to it yet. Has had it for a few months.   Cough is persistent since he was diagnosed with COVID. Brown-yellow mucus. Nighttime is worse with cough. Has history of septoplasty, significant post nasal drip. Sudafed helps - but that's not go  Has history of GERD s/p Nissen Fundoplication. Follows at [facility] on BID Dexilant. Has a motorized bed but isn't able to sleep with head of bed elevated - can't fall asleep.   Started on symbicort by PCP BID - thinks it seems to help. Never been on albuterol.   No childhood respiratory disease or recurrent pneumonia.   Social history:  Occupation: works at [employer].  Exposures: lives at home with wife. Has pet dog.  Smoking history: never smoker. Parents in childhood.   Social History   Occupational History  . 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 Smoking status: Never Smoker  . Smokeless tobacco: Never Used  Substance and Sexual Activity  . Alcohol use: No  . Drug use: No  . Sexual activity: Yes    Relevant family history: Family History  Problem Relation Age of Onset  . Coronary artery disease Father 59       CABG  . Aortic aneurysm Father 20       repaired  . Hyperlipidemia Father   . Lung cancer Mother   . Hypertension Mother   . Cancer Mother        bladder  . COPD Mother   . Stomach cancer Maternal Grandfather     Past Medical History:  Diagnosis Date  . Complication of anesthesia    slow to wake  . Coronary artery disease, non-occlusive    a. LHC 12/18: ostLAD 20%, p/mLAD 60% FFR 0.84, ost ramus 50%, mRCA 50% FFR 0.94, EF 65%  . Diverticulitis   . Diverticulosis   . Dyslipidemia   . Family history of adverse reaction to anesthesia    Mother - slow to wake  . GERD (gastroesophageal reflux disease)   . Hemorrhoids   . Hiatal hernia   . History of 2019 novel coronavirus disease (COVID-19) 12/24/2019  . History of echocardiogram    a. TTE 1/19: EF of 60-65%, normal wall motion, normal LV diastolic function, mildly dilated LA  .  Labile hypertension   . Lymphoma ([place])   . Medial epicondylitis of right elbow   . Meralgia paresthetica of right side   . Microscopic hematuria   . Ocular migraine   . Sleep apnea    CPAP    Past Surgical History:  Procedure Laterality Date  . INTRAVASCULAR PRESSURE WIRE/FFR STUDY N/A 12/07/2017   Procedure: INTRAVASCULAR PRESSURE WIRE/FFR STUDY;  Surgeon: Nelva Bush, MD;  Location: [facility];  Service: Cardiovascular;  Laterality: N/A;  . LEFT HEART CATH AND CORONARY ANGIOGRAPHY Left 12/07/2017   Procedure: LEFT HEART CATH AND CORONARY ANGIOGRAPHY;  Surgeon: Nelva Bush, MD;  Location: [facility];  Service: Cardiovascular;  Laterality: Left;  . nissen funduplication  [id number]   [name]  .  SEPTOPLASTY Bilateral 08/23/2019   Procedure: SEPTOPLASTY;  Surgeon: [name], MD;  Location: [facility];  Service: ENT;  Laterality: Bilateral;  . SPLENECTOMY    . TURBINATE REDUCTION Bilateral 08/23/2019   Procedure: TURBINATE REDUCTION;  Surgeon: [name], MD;  Location: [facility];  Service: ENT;  Laterality: Bilateral;  Latex sensitivity sleep apnea    Review of systems: Review of Systems  Constitutional: Positive for malaise/fatigue and weight loss. Negative for chills and fever.  HENT: Positive for congestion and sinus pain. Negative for sore throat.   Eyes: Negative for discharge and redness.  Respiratory: Positive for cough, hemoptysis and shortness of breath. Negative for sputum production and wheezing.   Cardiovascular: Positive for palpitations and leg swelling. Negative for chest pain.  Gastrointestinal: Positive for heartburn. Negative for nausea and vomiting.  Musculoskeletal: Positive for joint pain. Negative for myalgias.  Skin: Negative for rash.  Neurological: Positive for tingling. Negative for dizziness, tremors, focal weakness and headaches.  Endo/Heme/Allergies: Negative for environmental allergies.  Psychiatric/Behavioral: Negative for depression. The patient is nervous/anxious and has insomnia.   All other systems reviewed and are negative.   Physical Exam: Blood pressure 126/80, pulse 76, height 6\' 2"  (1.88 m), weight 210 lb (95.3 kg), SpO2 99 %. Gen:      No acute distress Lungs:    No increased respiratory effort, symmetric chest wall excursion, clear to auscultation bilaterally, no wheezes or crackles CV:         Regular rate and rhythm; no murmurs, rubs, or gallops.  1+ pitting edema bilaterally MSK: no acute synovitis of DIP or PIP joints, no mechanics hands.  Neuro: normal speech, no focal facial asymmetry Psych: alert and oriented x3, anxious, fatigued.    Data Reviewed/Medical Decision Making:  Independent interpretation of  tests: Imaging: . Review of patient's chest xray 02/19/2020 images revealed no pneumonia, mild left sided atelectasis. The patient's images have been independently reviewed by me.    PFTs: None on file  Labs:  Lab Results  Component Value Date   WBC 7.2 07/20/2018   HGB 15.2 07/20/2018   HCT 44.7 07/20/2018   MCV 90.7 07/20/2018   PLT 420.0 (H) 07/20/2018   Lab Results  Component Value Date   NA 140 11/28/2019   K 3.9 11/28/2019   CL 104 11/28/2019   CO2 26 11/28/2019     Immunization status:  Immunization History  Administered Date(s) Administered  . HiB (PRP-T) 05/29/2015  . Influenza Inj Mdck Quad Pf 09/29/2019  . Influenza Inj Mdck Quad With Preservative 11/26/2018  . Influenza Whole 09/18/2010  . Influenza,inj,Quad PF,6+ Mos 09/14/2016  . Influenza-Unspecified 11/19/2012, 10/09/2013, 10/11/2015  . Pneumococcal Conjugate-13 09/07/2016  . Pneumococcal Polysaccharide-23 02/17/2015  .  Tdap 08/25/2008    . I reviewed prior external note(s) from Urgent Care, PCP . I reviewed the result(s) of the labs and imaging as noted above.  . I have ordered PFTs  Assessment:  COVID 19 Pneumonia Chronic cough - mixed etiology with longstanding GERD history as well as chronic rhinitis s/p septoplasty Fatigue/Dyspnea related to COVID 19 infection Palpitations OSA - not well controlled, has CPAP prescribed   Plan/Recommendations: We discussed disease management and progression at length today. Some of his symptoms are chronic due to uncontrolled sleep apnea, GERD, chronic rhinitis. I would continue nasacort for now and we discussed flonase technique optimization. Dyspnea my be related to a post-covid syndrome. Based on exam I doubt covid fibrosis. Would continue symbicort as he has had some benefit. Will get some PFTs.  He needs to start wearing CPAP Sleeping upright/on incline may help his GERD symptoms and nocturnal cough. Held off on albuterol as he already has anxiety and  feeling jittery and palpitations.    Return to Care: Return in about 4 months (around 07/06/2020).  Lenice Llamas, MD [facility]  CC: [name], MD

## 2020-03-06 NOTE — Patient Instructions (Addendum)
Flonase/nasacort - 1 spray on each side of your nose once a day for first week.  Instructions for use:  If you also use a saline nasal spray or rinse, use that first.  Position the head with the chin slightly tucked. Use the right hand to spray into the left nostril and the right hand to spray into the left nostril.   Point the bottle away from the septum of your nose (cartilage that divides the two sides of your nose).   Hold the nostril closed on the opposite side from where you will spray  Spray once and gently sniff to pull the medicine into the higher parts of your nose.  Don't sniff too hard as the medicine will drain down the back of your throat instead.  Repeat with a second spray on the same side if prescribed.  Repeat on the other side of your nose.  Don't blow your nose or sneeze for 30 minutes after spraying.

## 2020-03-10 ENCOUNTER — Other Ambulatory Visit: Payer: Self-pay

## 2020-03-10 ENCOUNTER — Other Ambulatory Visit (INDEPENDENT_AMBULATORY_CARE_PROVIDER_SITE_OTHER): Payer: 59

## 2020-03-10 DIAGNOSIS — I10 Essential (primary) hypertension: Secondary | ICD-10-CM

## 2020-03-10 DIAGNOSIS — E785 Hyperlipidemia, unspecified: Secondary | ICD-10-CM

## 2020-03-10 DIAGNOSIS — R7303 Prediabetes: Secondary | ICD-10-CM | POA: Diagnosis not present

## 2020-03-11 LAB — HEPATIC FUNCTION PANEL
ALT: 19 U/L (ref 0–53)
AST: 19 U/L (ref 0–37)
Albumin: 4.3 g/dL (ref 3.5–5.2)
Alkaline Phosphatase: 76 U/L (ref 39–117)
Bilirubin, Direct: 0 mg/dL (ref 0.0–0.3)
Total Bilirubin: 0.4 mg/dL (ref 0.2–1.2)
Total Protein: 7.1 g/dL (ref 6.0–8.3)

## 2020-03-11 LAB — BASIC METABOLIC PANEL
BUN: 21 mg/dL (ref 6–23)
CO2: 24 mEq/L (ref 19–32)
Calcium: 9.4 mg/dL (ref 8.4–10.5)
Chloride: 106 mEq/L (ref 96–112)
Creatinine, Ser: 1.31 mg/dL (ref 0.40–1.50)
GFR: 55.17 mL/min — ABNORMAL LOW (ref >60.00)
Glucose, Bld: 124 mg/dL — ABNORMAL HIGH (ref 70–99)
Potassium: 3.8 mEq/L (ref 3.5–5.1)
Sodium: 137 mEq/L (ref 135–145)

## 2020-03-11 LAB — LIPID PANEL
Cholesterol: 149 mg/dL (ref 0–200)
HDL: 30.5 mg/dL — ABNORMAL LOW (ref >39.00)
NonHDL: 118.28
Total CHOL/HDL Ratio: 5
Triglycerides: 233 mg/dL — ABNORMAL HIGH (ref 0.0–149.0)
VLDL: 46.6 mg/dL — ABNORMAL HIGH (ref 0.0–40.0)

## 2020-03-11 LAB — HEMOGLOBIN A1C: Hgb A1c MFr Bld: 5.8 % (ref 4.6–6.5)

## 2020-03-11 LAB — LDL CHOLESTEROL, DIRECT: Direct LDL: 98 mg/dL

## 2020-03-19 ENCOUNTER — Other Ambulatory Visit: Payer: 59

## 2020-03-24 ENCOUNTER — Other Ambulatory Visit: Admission: RE | Admit: 2020-03-24 | Payer: 59 | Source: Ambulatory Visit

## 2020-03-26 ENCOUNTER — Other Ambulatory Visit: Payer: Self-pay

## 2020-03-26 ENCOUNTER — Encounter: Payer: Self-pay | Admitting: Internal Medicine

## 2020-03-26 ENCOUNTER — Ambulatory Visit (INDEPENDENT_AMBULATORY_CARE_PROVIDER_SITE_OTHER): Payer: 59 | Admitting: Internal Medicine

## 2020-03-26 VITALS — BP 130/98 | HR 74 | Ht 74.0 in | Wt 209.1 lb

## 2020-03-26 DIAGNOSIS — R0602 Shortness of breath: Secondary | ICD-10-CM

## 2020-03-26 DIAGNOSIS — E785 Hyperlipidemia, unspecified: Secondary | ICD-10-CM

## 2020-03-26 DIAGNOSIS — M79605 Pain in left leg: Secondary | ICD-10-CM

## 2020-03-26 DIAGNOSIS — I10 Essential (primary) hypertension: Secondary | ICD-10-CM | POA: Diagnosis not present

## 2020-03-26 DIAGNOSIS — M79604 Pain in right leg: Secondary | ICD-10-CM

## 2020-03-26 MED ORDER — FUROSEMIDE 40 MG PO TABS: 40.0000 mg | ORAL_TABLET | Freq: Every day | ORAL | 1 refills | Status: DC

## 2020-03-26 NOTE — Progress Notes (Signed)
Follow-up Outpatient Visit Date: 03/26/2020  Primary Care Provider: [name], MD [address] [place] [address]  Chief Complaint: Numbness in feet  HPI:  Brian Atkins is a 64 y.o. male with history of nonobstructive coronary artery disease by catheterization in 11/2017,hypertension, marginal zone B-cell lymphoma, GERD status post Nissen fundoplication, and [id number] infection in 12/2019, who presents for follow-up of coronary artery disease.  I last saw him in mid February, at which time he was still recovering from his COVID-19 infection with persistent exertional dyspnea, fatigue, and cough.  He also reported two-pillow orthopnea and a 5 pound weight gain in spite of poor appetite since contracting COVID.  Subsequent echo showed normal LVEF with grade 1 diastolic dysfunction.  No significant valvular abnormality was seen.  He was seen by pulmonology last month, who felt that his symptoms were likely due to uncontrolled sleep apnea, GERD, and chronic rhinitis.  Today, Brian Atkins has multiple complaints.  Over the last few weeks, he reports numbness in his legs and feet, most pronounced when lying down.  He has previously seen Dr. Manuella Ghazi (neurology) for evaluation of this.  He did not tolerate gabapentin over topiramate.  He is scheduled to see Dr. Manuella Ghazi later today for reevaluation.  Brian Atkins continues to have significant exertional dyspnea that varies in severity from 1 day to the next.  He was seen by pulmonary after our last visit and was advised to undergo PFTs.  He has not scheduled this and would prefer to do it in [place].  He has increased furosemide to 40 mg daily to control his leg edema.  He feels like it has improved with addition of furosemide and de-escalation of amlodipine.  He denies chest pain, palpitations, and lightheadedness.  Home blood pressures have been  "erratic."  --------------------------------------------------------------------------------------------------  Past Medical History:  Diagnosis Date  . Complication of anesthesia    slow to wake  . Coronary artery disease, non-occlusive    a. LHC 12/18: ostLAD 20%, p/mLAD 60% FFR 0.84, ost ramus 50%, mRCA 50% FFR 0.94, EF 65%  . Diverticulitis   . Diverticulosis   . Dyslipidemia   . Family history of adverse reaction to anesthesia    Mother - slow to wake  . GERD (gastroesophageal reflux disease)   . Hemorrhoids   . Hiatal hernia   . History of 2019 novel coronavirus disease (COVID-19) 12/24/2019  . History of echocardiogram    a. TTE 1/19: EF of 60-65%, normal wall motion, normal LV diastolic function, mildly dilated LA  . Labile hypertension   . Lymphoma ([place])   . Medial epicondylitis of right elbow   . Meralgia paresthetica of right side   . Microscopic hematuria   . Ocular migraine   . Sleep apnea    CPAP   Past Surgical History:  Procedure Laterality Date  . INTRAVASCULAR PRESSURE WIRE/FFR STUDY N/A 12/07/2017   Procedure: INTRAVASCULAR PRESSURE WIRE/FFR STUDY;  Surgeon: Nelva Bush, MD;  Location: [facility];  Service: Cardiovascular;  Laterality: N/A;  . LEFT HEART CATH AND CORONARY ANGIOGRAPHY Left 12/07/2017   Procedure: LEFT HEART CATH AND CORONARY ANGIOGRAPHY;  Surgeon: Nelva Bush, MD;  Location: [facility];  Service: Cardiovascular;  Laterality: Left;  . nissen funduplication  [id number]   [name]  . SEPTOPLASTY Bilateral 08/23/2019   Procedure: SEPTOPLASTY;  Surgeon: [name], MD;  Location: [facility];  Service: ENT;  Laterality: Bilateral;  . SPLENECTOMY    .  TURBINATE REDUCTION Bilateral 08/23/2019   Procedure: TURBINATE REDUCTION;  Surgeon: [name], MD;  Location: [facility];  Service: ENT;  Laterality: Bilateral;  Latex sensitivity sleep apnea    Current Meds  Medication Sig  . amLODipine  (NORVASC) 5 MG tablet Take 5 mg by mouth daily.  . benzonatate (TESSALON) 200 MG capsule Take 1 capsule (200 mg total) by mouth 3 (three) times daily as needed for cough.  . carvedilol (COREG) 25 MG tablet Take 1 tablet (25 mg total) by mouth 2 (two) times daily.  . cyanocobalamin (,VITAMIN B-12,) 1000 MCG/ML injection INJECT 1 ML INTO THE MUSCLE DAILY FOR 3 DAYS, THEN WEEKLY FOR 4 WEEKS , THEN MONTHLY THEREAFTER  . dexlansoprazole (DEXILANT) 60 MG capsule Take 60 mg by mouth 2 (two) times daily.  [name] ezetimibe (ZETIA) 10 MG tablet Take 1 tablet (10 mg total) by mouth daily.  . Galcanezumab-gnlm (EMGALITY) 120 MG/ML SOAJ Inject 120 mg into the skin every 30 (thirty) days.  [name] LORazepam (ATIVAN) 1 MG tablet Take 1 tablet (1 mg total) by mouth every 8 (eight) hours as needed.  [name] losartan (COZAAR) 100 MG tablet TAKE 1 TABLET BY MOUTH EVERY DAY  . nitroGLYCERIN (NITROSTAT) 0.4 MG SL tablet Place 1 tablet (0.4 mg total) under the tongue every 5 (five) minutes as needed for chest pain. Maximum of 3 doses.  Glory Rosebush VERIO test strip USE TO CHECK BLOOD SUGAR AS NEEDED  . prochlorperazine (COMPAZINE) 5 MG tablet Take 5 mg by mouth every 6 (six) hours as needed for nausea or vomiting.  . Prucalopride Succinate (MOTEGRITY) 2 MG TABS Take 1 tablet by mouth daily.  . rosuvastatin (CRESTOR) 20 MG tablet Take 1 tablet (20 mg total) by mouth daily.  [name] senna (SENOKOT) 8.6 MG TABS tablet Take 1 tablet by mouth.  . SYMBICORT 80-4.5 MCG/ACT inhaler Inhale 2 puffs into the lungs 2 (two) times daily.  . Syringe/Needle, Disp, (SYRINGE 3CC/25GX1") 25G X 1" 3 ML MISC Use for b12 injections  . zolpidem (AMBIEN) 10 MG tablet TAKE 1 TABLET (10 MG TOTAL) BY MOUTH AT BEDTIME AS NEEDED FOR SLEEP.  . [DISCONTINUED] furosemide (LASIX) 20 MG tablet Take 1 tablet (20 mg total) by mouth daily. (Patient taking differently: Take 40 mg by mouth daily. )    Allergies: Avelox [moxifloxacin], Contrast media [iodinated diagnostic agents],  Crestor [rosuvastatin calcium], Morphine and related, Niacin-simvastatin er, Latex, and Tape  Social History   Tobacco Use  . Smoking status: Never Smoker  . Smokeless tobacco: Never Used  Substance Use Topics  . Alcohol use: No  . Drug use: No    Family History  Problem Relation Age of Onset  . Coronary artery disease Father 103       CABG  . Aortic aneurysm Father 28       repaired  . Hyperlipidemia Father   . Lung cancer Mother   . Hypertension Mother   . Cancer Mother        bladder  . COPD Mother   . Stomach cancer Maternal Grandfather     Review of Systems: A 12-system review of systems was performed and was negative except as noted in the HPI.  --------------------------------------------------------------------------------------------------  Physical Exam: BP (!) 130/98 (BP Location: Left Arm, Patient Position: Sitting, Cuff Size: Normal)   Pulse 74   Ht 6\' 2"  (1.88 m)   Wt 209 lb 2 oz (94.9 kg)   SpO2 97%   BMI 26.85 kg/m   General: NAD.  HEENT: No conjunctival pallor or scleral icterus. Facemask in place. Neck: No JVD or HJR. Lungs: Normal work of breathing. Clear to auscultation bilaterally without wheezes or crackles. Heart: Regular rate and rhythm without murmurs, rubs, or gallops. Abd: Bowel sounds present. Soft, NT/ND. Ext: Trace to 1+ ankle edema.  EKG: Normal sinus rhythm without abnormality.  Lab Results  Component Value Date   WBC 7.2 07/20/2018   HGB 15.2 07/20/2018   HCT 44.7 07/20/2018   MCV 90.7 07/20/2018   PLT 420.0 (H) 07/20/2018    Lab Results  Component Value Date   NA 137 03/10/2020   K 3.8 03/10/2020   CL 106 03/10/2020   CO2 24 03/10/2020   BUN 21 03/10/2020   CREATININE 1.31 03/10/2020   GLUCOSE 124 (H) 03/10/2020   ALT 19 03/10/2020    Lab Results  Component Value Date   CHOL 149 03/10/2020   HDL 30.50 (L) 03/10/2020   LDLCALC 127 (H) 11/28/2019   LDLDIRECT 98.0 03/10/2020   TRIG 233.0 (H) 03/10/2020    CHOLHDL 5 03/10/2020    --------------------------------------------------------------------------------------------------  ASSESSMENT AND PLAN: Shortness of breath: Ongoing problem since COVID-19 infection earlier this year.  We will arrange for PFTs in [place] per the patient's request.  He should continue follow-up with pulmonary.  Echocardiogram showed preserved LVEF with grade 1 diastolic dysfunction.  I think it is reasonable to continue with furosemide 40 mg daily.  Hypertension: Blood pressure is suboptimally controlled today.  We discussed the importance of sodium restriction.  We will defer medication changes at this time.  Leg pain: Quality of pain is most consistent with a neuropathic process.  However, given history of CAD and hyperlipidemia, we have agreed to perform ABIs to exclude underlying PAD.  Hyperlipidemia: Continue rosuvastatin and ezetimibe has improved to 98.  Brian Atkins should work on lifestyle modifications to help further improve his lipids.  Follow-up: Return to clinic in 3 months.  Nelva Bush, MD 03/27/2020 6:56 AM

## 2020-03-26 NOTE — Patient Instructions (Signed)
Medication Instructions:  Your physician recommends that you continue on your current medications as directed. Please refer to the Current Medication list given to you today.  You may continue taking furosemide 40 mg by mouth once a day.  *If you need a refill on your cardiac medications before your next appointment, please call your pharmacy*   Lab Work: none If you have labs (blood work) drawn today and your tests are completely normal, you will receive your results only by:  [place] (if you have MyChart) OR  A paper copy in the mail If you have any lab test that is abnormal or we need to change your treatment, we will call you to review the results.   Testing/Procedures: 1- ABI's/Lower extremity arterial dopplers - Your physician has requested that you have an ankle brachial index (ABI). During this test an ultrasound and blood pressure cuff are used to evaluate the arteries that supply the arms and legs with blood. Allow thirty minutes for this exam. There are no restrictions or special instructions.  Your physician has requested that you have a lower extremity arterial doppler- During this test, ultrasound is used to evaluate arterial blood flow in the legs. Allow approximately one hour for this exam.    2- Pulmonary Function Tests (PFTs)   Your physician has recommended that you have a pulmonary function test. Pulmonary Function Tests are a group of tests that measure how well air moves in and out of your lungs.  See handout regarding pre-procedural instructions.    Follow-Up: At [facility], you and your health needs are our priority.  As part of our continuing mission to provide you with exceptional heart care, we have created designated Provider Care Teams.  These Care Teams include your primary Cardiologist (physician) and Advanced Practice Providers (APPs -  Physician Assistants and Nurse Practitioners) who all work together to provide you with the care you need,  when you need it.  We recommend signing up for the patient portal called "MyChart".  Sign up information is provided on this After Visit Summary.  MyChart is used to connect with patients for Virtual Visits (Telemedicine).  Patients are able to view lab/test results, encounter notes, upcoming appointments, etc.  Non-urgent messages can be sent to your provider as well.   To learn more about what you can do with MyChart, go to [email or web address].    Your next appointment:   3 month(s)  The format for your next appointment:   In Person  Provider:    You may see Nelva Bush, MD or one of the following Advanced Practice Providers on your designated Care Team:    Murray Hodgkins, NP  [name], PA-C  Marrianne Mood, PA-C    Pulmonary Function Tests Pulmonary function tests (PFTs) are used to measure how well your lungs work, find out what is causing your lung problems, and figure out the best treatment for you. You may have PFTs:  When you have an illness involving the lungs.  To follow changes in your lung function over time if you have a chronic lung disease.  If you are an Nature conservation officer. This checks the effects of being exposed to chemicals over a long period of time.  To check lung function before having surgery or other procedures.  To check your lungs if you smoke.  To check if prescribed medicines or treatments are helping your lungs. Your results will be compared to the expected lung function of someone with healthy lungs  who is similar to you in:  Age.  Gender.  Height.  Weight.  Race or ethnicity. This is done to show how your lungs compare to normal lung function (percent predicted). This is how your health care provider knows if your lung function is normal or not. If you have had PFTs done before, your health care provider will compare your current results with past results. This shows if your lung function is better, worse, or the same  as before. Tell a health care provider about:  Any allergies you have.  All medicines you are taking, including inhaler or nebulizer medicines, vitamins, herbs, eye drops, creams, and over-the-counter medicines.  Any blood disorders you have.  Any surgeries you have had, especially recent eye surgery, abdominal surgery, or chest surgery. These can make PFTs difficult or unsafe.  Any medical conditions you have, including chest pain or heart problems, tuberculosis, or respiratory infections such as pneumonia, a cold, or the flu.  Any fear of being in closed spaces (claustrophobia). Some of your tests may be in a closed space. What are the risks? Generally, this is a safe procedure. However, problems may occur, including:  Light-headedness due to over-breathing (hyperventilation).  An asthma attack from deep breathing.  A collapsed lung. What happens before the procedure?  Take over-the-counter and prescription medicines only as told by your health care provider. If you take inhaler or nebulizer medicines, ask your health care provider which medicines you should take on the day of your testing. Some inhaler medicines may interfere with PFTs if they are taken shortly before the tests.  Follow your health care provider's instructions on eating and drinking restrictions. This may include avoiding eating large meals and drinking alcohol before the testing.  Do not use any products that contain nicotine or tobacco, such as cigarettes and e-cigarettes. If you need help quitting, ask your health care provider.  Wear comfortable clothing that will not interfere with breathing. What happens during the procedure?   You will be given a soft nose clip to wear. This is done so all of your breaths will go through your mouth instead of your nose.  You will be given a germ-free (sterile) mouthpiece. It will be attached to a machine that measures your breathing (spirometer).  You will be asked to  do various breathing maneuvers. The maneuvers will be done by breathing in (inhaling) and breathing out (exhaling). You may be asked to repeat the maneuvers several times before the testing is done.  It is important to follow the instructions exactly to get accurate results. Make sure to blow as hard and as fast as you can when you are told to do so.  You may be given a medicine that makes the small air passages in your lungs larger (bronchodilator) after testing has been done. This medicine will make it easier for you to breathe.  The tests will be repeated after the bronchodilator has taken effect.  You will be monitored carefully during the procedure for faintness, dizziness, trouble breathing, or any other problems. The procedure may vary among health care providers and hospitals. What happens after the procedure?  It is up to you to get your test results. Ask your health care provider, or the department that is doing the tests, when your results will be ready. After you have received your test results, talk with your health care provider about treatment options, if necessary. Summary  Pulmonary function tests (PFTs) are used to measure how well your lungs  work, find out what is causing your lung problems, and figure out the best treatment for you.  Wear comfortable clothing that will not interfere with breathing.  It is up to you to get your test results. After you have received them, talk with your health care provider about treatment options, if necessary. This information is not intended to replace advice given to you by your health care provider. Make sure you discuss any questions you have with your health care provider. Document Revised: 12/03/2016 Document Reviewed: 10/28/2016 Elsevier Patient Education  [place] Index Test Why am I having this test? The ankle-brachial index (ABI) test is used to diagnose peripheral vascular disease (PVD). PVD is  also known as peripheral arterial disease (PAD). PVD is the blocking or hardening of the arteries anywhere within the circulatory system beyond the heart. PVD is caused by:  Cholesterol deposits in your blood vessels (atherosclerosis). This is the most common cause of this condition.  Irritation and swelling (inflammation) in the blood vessels.  Blood clots in the vessels. Cholesterol deposits cause arteries to narrow. Normal delivery of oxygen to your tissues is affected, causing muscle pain and fatigue. This is called claudication. PVD means that there may also be a buildup of cholesterol:  In your heart. This increases the risk of heart attacks.  In your brain. This increases the risk of strokes. What is being tested? The ankle-brachial index test measures the blood flow in your arms and legs. The blood flow will show if blood vessels in your legs have been narrowed by cholesterol deposits. How do I prepare for this test?  Wear loose clothing.  Do not use any tobacco products, including cigarettes, chewing tobacco, or e-cigarettes, for at least 30 minutes before the test. What happens during the test?  1. You will lie down in a resting position. 2. Your health care provider will use a blood pressure machine and a small ultrasound device (Doppler) to measure the systolic pressures on your upper arms and ankles. Systolic pressure is the pressure inside your arteries when your heart pumps. 3. Systolic pressure measurements will be taken several times, and at several points, on both the ankle and the arm. 4. Your health care provider will divide the highest systolic pressure of the ankle by the highest systolic pressure of the arm. The result is the ankle-brachial pressure ratio, or ABI. Sometimes this test will be repeated after you have exercised on a treadmill for 5 minutes. You may have leg pain during the exercise portion of the test if you suffer from PVD. If the index number drops  after exercise, this may show that PVD is present. How are the results reported? Your test results will be reported as a value that shows the ratio of your ankle pressure to your arm pressure (ABI ratio). Your health care provider will compare your results to normal ranges that were established after testing a large group of people (reference ranges). Reference ranges may vary among labs and hospitals. For this test, a common reference range is:  ABI ratio of 0.9 to 1.3. What do the results mean? An ABI ratio that is below the reference range is considered abnormal and may indicate PVD in the legs. Talk with your health care provider about what your results mean. Questions to ask your health care provider Ask your health care provider, or the department that is doing the test:  When will my results be ready?  How will I  get my results?  What are my treatment options?  What other tests do I need?  What are my next steps? Summary  The ankle-brachial index (ABI) test is used to diagnose peripheral vascular disease (PVD). PVD is also known as peripheral arterial disease (PAD).  The ankle-brachial index test measures the blood flow in your arms and legs.  The highest systolic pressure of the ankle is divided by the highest systolic pressure of the arm. The result is the ABI ratio.  An ABI ratio that is below 0.9 is considered abnormal and may indicate PVD in the legs. This information is not intended to replace advice given to you by your health care provider. Make sure you discuss any questions you have with your health care provider. Document Revised: 09/28/2018 Document Reviewed: 08/30/2017 Elsevier Patient Education  2020 [employer].

## 2020-03-27 ENCOUNTER — Other Ambulatory Visit: Payer: 59

## 2020-03-27 ENCOUNTER — Encounter: Payer: Self-pay | Admitting: Internal Medicine

## 2020-03-27 DIAGNOSIS — M79604 Pain in right leg: Secondary | ICD-10-CM | POA: Insufficient documentation

## 2020-04-05 ENCOUNTER — Ambulatory Visit: Payer: 59 | Attending: Internal Medicine

## 2020-04-05 DIAGNOSIS — Z23 Encounter for immunization: Secondary | ICD-10-CM

## 2020-04-05 NOTE — Progress Notes (Signed)
   Covid-19 Vaccination Clinic  Name:  Brian Atkins    MRN: [id number] DOB: 26-Oct-1956  04/05/2020  [name] was observed post Covid-19 immunization for 15 minutes without incident. He was provided with Vaccine Information Sheet and instruction to access the V-Safe system.   [name] was instructed to call 911 with any severe reactions post vaccine: [name] Difficulty breathing  . Swelling of face and throat  . A fast heartbeat  . A bad rash all over body  . Dizziness and weakness   Immunizations Administered    Name Date Dose VIS Date Route   Pfizer COVID-19 Vaccine 04/05/2020  8:42 AM 0.3 mL 11/30/2019 Intramuscular   Manufacturer: [place]   Lot: [id number]   [place]: [id number]

## 2020-04-10 ENCOUNTER — Other Ambulatory Visit: Payer: Self-pay | Admitting: *Deleted

## 2020-04-10 DIAGNOSIS — R0602 Shortness of breath: Secondary | ICD-10-CM

## 2020-04-10 DIAGNOSIS — Z8616 Personal history of COVID-19: Secondary | ICD-10-CM

## 2020-04-14 ENCOUNTER — Other Ambulatory Visit: Payer: Self-pay | Admitting: Internal Medicine

## 2020-04-14 ENCOUNTER — Telehealth: Payer: 59 | Admitting: Nurse Practitioner

## 2020-04-14 ENCOUNTER — Encounter: Payer: Self-pay | Admitting: Nurse Practitioner

## 2020-04-14 VITALS — Ht 74.0 in | Wt 200.0 lb

## 2020-04-14 DIAGNOSIS — R5382 Chronic fatigue, unspecified: Secondary | ICD-10-CM

## 2020-04-14 DIAGNOSIS — R29898 Other symptoms and signs involving the musculoskeletal system: Secondary | ICD-10-CM

## 2020-04-14 DIAGNOSIS — Z8616 Personal history of COVID-19: Secondary | ICD-10-CM

## 2020-04-14 NOTE — Progress Notes (Signed)
Patient ID: Brian Atkins, male   DOB: 04/22/1956, 64 y.o.   MRN: [id number] I connected with Brian Atkins on 04/14/2020 by telephone and verified that I am speaking with the correct person using two identifiers.   Location:  Patient: home Provider: work    I discussed the limitations, risks, security and privacy concerns of performing an evaluation and management service by telephone and the availability of in person appointments. I also discussed with the patient that there may be a patient responsible charge related to this service. The patient expressed understanding and agreed to proceed.    History of Present Illness:  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 These problems are actively followed by Neurology, Cardiology, and Pulmonology.   He has migraine with aura (normal MRI brain 02/02/2019), insomnia, sleep apnea and numbness /tingling bilateral hands and feet concerning for neuropathy and started on gabapentin  at night, and B12 supplements. He says the medicines have helped a little. He just has overall very poor energy and he tried walking yest- and legs so heavy and sluggish  that he could barely lift them up. He just does not feel well. No fevers or chills.  No cough, congestion, chest pain. No calf pain or edema. He is eating and drinking normally. No dysuria.   Cardiology visit for edema, weight gain, orthopnea and echo to evaluate for new cardiomyopathy revealed EF 60-65% . Placed on Lasix, amlodipine dose adjusted. He has non-obstructive CAD per cath and cardiac  CTA. Saw Cardiology 03/26/2020 for leg pain and ordered ABIs to exclude underlying PAD-pending.    He had cough with hemoptysis and was [facility] in [facility]  with CXR 02/19/2020: no acute changes. He saw PULM 03/06/2020: fatigue , chills, decreased appetite, trouble sleeping, lower extremity edema, lower extremity paresthesia.  Chronic cough with mixed etiology with longstanding GERD history as well as chronic rhinitis status post septoplasty, fatigue dyspnea related to Covid, OSA, not using CPAP, palpitations. PFTS were ordered-pending.    Past Medical History:  Diagnosis Date  . Complication of anesthesia    slow to wake  . Coronary artery disease, non-occlusive    a. LHC 12/18: ostLAD 20%, p/mLAD 60% FFR 0.84, ost ramus 50%, mRCA 50% FFR 0.94, EF 65%  . Diverticulitis   . Diverticulosis   . Dyslipidemia   . Family history of adverse reaction to anesthesia    Mother - slow to wake  . GERD (gastroesophageal reflux disease)   . Hemorrhoids   . Hiatal hernia   . History of 2019 novel coronavirus disease (COVID-19) 12/24/2019  . History of echocardiogram    a. TTE 1/19: EF of 60-65%, normal wall motion, normal LV diastolic function, mildly dilated LA  . Labile hypertension   . Lymphoma ([place])   . Medial epicondylitis of right elbow   . Meralgia paresthetica of right side   . Microscopic hematuria   . Ocular migraine   . Sleep apnea    CPAP     Social History   Socioeconomic History  . Marital status: Married  Spouse name: Not on file  . Number of children: 2  . Years of education: 81  . Highest education level: Not on file  Occupational History  . Occupation: Shop Printmaker: [employer]    Comment: [place]  Tobacco Use  . Smoking status: Never Smoker  . Smokeless tobacco: Never Used  Substance and Sexual Activity  . Alcohol use: No  . Drug use: No  . Sexual activity: Yes  Other Topics Concern  . Not on file  Social History Narrative  . Not on  file   Social Determinants of Health   Financial Resource Strain:   . Difficulty of Paying Living Expenses:   Food Insecurity:   . Worried About Charity fundraiser in the Last Year:   . Arboriculturist in the Last Year:   Transportation Needs:   . Film/video editor (Medical):   [name] Lack of Transportation (Non-Medical):   Physical Activity:   . Days of Exercise per Week:   . Minutes of Exercise per Session:   Stress:   . Feeling of Stress :   Social Connections:   . Frequency of Communication with Friends and Family:   . Frequency of Social Gatherings with Friends and Family:   . Attends Religious Services:   . Active Member of Clubs or Organizations:   . Attends Archivist Meetings:   [name] Marital Status:   Intimate Partner Violence:   . Fear of Current or Ex-Partner:   . Emotionally Abused:   [name] Physically Abused:   . Sexually Abused:     Past Surgical History:  Procedure Laterality Date  . INTRAVASCULAR PRESSURE WIRE/FFR STUDY N/A 12/07/2017   Procedure: INTRAVASCULAR PRESSURE WIRE/FFR STUDY;  Surgeon: Nelva Bush, MD;  Location: [place] [facility];  Service: Cardiovascular;  Laterality: N/A;  . LEFT HEART CATH AND CORONARY ANGIOGRAPHY Left 12/07/2017   Procedure: LEFT HEART CATH AND CORONARY ANGIOGRAPHY;  Surgeon: Nelva Bush, MD;  Location: [place] [facility];  Service: Cardiovascular;  Laterality: Left;  . nissen funduplication  [id number]   [name]  . SEPTOPLASTY Bilateral 08/23/2019   Procedure: SEPTOPLASTY;  Surgeon: [name], MD;  Location: [place];  Service: ENT;  Laterality: Bilateral;  . SPLENECTOMY    . TURBINATE REDUCTION Bilateral 08/23/2019   Procedure: TURBINATE REDUCTION;  Surgeon: [name], MD;  Location: [place];  Service: ENT;  Laterality: Bilateral;  Latex sensitivity sleep apnea    Family History  Problem Relation Age of Onset  . Coronary artery disease Father 86       CABG  . Aortic aneurysm  Father 61       repaired  . Hyperlipidemia Father   . Lung cancer Mother   . Hypertension Mother   . Cancer Mother        bladder  . COPD Mother   . Stomach cancer Maternal Grandfather     Allergies  Allergen Reactions  . Avelox [Moxifloxacin] Hives and Swelling    Severe facial swelling  . Contrast Media [Iodinated Diagnostic Agents] Swelling    (OK with pre-medication)  . Crestor [Rosuvastatin Calcium]     myalgia  . Morphine And Related Itching  . Niacin-Simvastatin Er     myalgia  . Latex Rash    Gloves cause raxh  . Tape Dermatitis    Current Outpatient Medications on File Prior to Visit  Medication Sig Dispense Refill  . amLODipine (NORVASC) 5 MG  tablet Take 5 mg by mouth daily.    . benzonatate (TESSALON) 200 MG capsule Take 1 capsule (200 mg total) by mouth 3 (three) times daily as needed for cough. 60 capsule 1  . carvedilol (COREG) 25 MG tablet Take 1 tablet (25 mg total) by mouth 2 (two) times daily. 120 tablet 2  . cyanocobalamin (,VITAMIN B-12,) 1000 MCG/ML injection INJECT 1 ML INTO THE MUSCLE DAILY FOR 3 DAYS, THEN WEEKLY FOR 4 WEEKS , THEN MONTHLY THEREAFTER 6 mL 5  . dexlansoprazole (DEXILANT) 60 MG capsule Take 60 mg by mouth 2 (two) times daily.    [name] ezetimibe (ZETIA) 10 MG tablet Take 1 tablet (10 mg total) by mouth daily. 90 tablet 3  . furosemide (LASIX) 40 MG tablet Take 1 tablet (40 mg total) by mouth daily. 90 tablet 1  . Galcanezumab-gnlm (EMGALITY) 120 MG/ML SOAJ Inject 120 mg into the skin every 30 (thirty) days.    [name] LORazepam (ATIVAN) 1 MG tablet Take 1 tablet (1 mg total) by mouth every 8 (eight) hours as needed. 30 tablet 3  . losartan (COZAAR) 100 MG tablet TAKE 1 TABLET BY MOUTH EVERY DAY 90 tablet 0  . nitroGLYCERIN (NITROSTAT) 0.4 MG SL tablet Place 1 tablet (0.4 mg total) under the tongue every 5 (five) minutes as needed for chest pain. Maximum of 3 doses. 35 tablet 3  . ONETOUCH VERIO test strip USE TO CHECK BLOOD SUGAR AS NEEDED 100 strip  2  . prochlorperazine (COMPAZINE) 5 MG tablet Take 5 mg by mouth every 6 (six) hours as needed for nausea or vomiting.    . Prucalopride Succinate (MOTEGRITY) 2 MG TABS Take 1 tablet by mouth daily.    [name] rOPINIRole (REQUIP) 0.25 MG tablet Take by mouth.    . rosuvastatin (CRESTOR) 20 MG tablet Take 1 tablet (20 mg total) by mouth daily. 30 tablet 11  . senna (SENOKOT) 8.6 MG TABS tablet Take 1 tablet by mouth.    . SYMBICORT 80-4.5 MCG/ACT inhaler Inhale 2 puffs into the lungs 2 (two) times daily. 1 Inhaler 5  . Syringe/Needle, Disp, (SYRINGE 3CC/25GX1") 25G X 1" 3 ML MISC Use for b12 injections 50 each 0  . zolpidem (AMBIEN) 10 MG tablet TAKE 1 TABLET (10 MG TOTAL) BY MOUTH AT BEDTIME AS NEEDED FOR SLEEP. 30 tablet 5  . [DISCONTINUED] topiramate (TOPAMAX) 50 MG tablet TAKE 0.5 TABLET BY MOUTH NIGHTLY FOR 2 WEEKS, THEN INCREASE TO 1 TABLET NIGHTLY     No current facility-administered medications on file prior to visit.    Ht '6\' 2"'  (1.88 m)   Wt 200 lb (90.7 kg)   BMI 25.68 kg/m     Objective:   VITALS: 98.5- 67-135/97- pulse ox  97%   GENERAL: He sounds  alert, oriented, appears well and in no acute distress   LUNGS: He is speaking in full sentences, no congestion or cough noted. Breathing rate appears normal, no obvious gross SOB, gasping or wheezing   PSYCH/NEURO: pleasant and cooperative, calm and conversational. Speech and thought processing grossly intact.  Wt Readings from Last 3 Encounters:  04/14/20 200 lb (90.7 kg)  03/26/20 209 lb 2 oz (94.9 kg)  03/06/20 210 lb (95.3 kg)   BP Readings from Last 3 Encounters:  03/26/20 (!) 130/98  03/06/20 126/80  02/19/20 (!) 139/92     Chemistry      Component Value Date/Time   NA 137 03/10/2020 1551   NA 145 (H) 11/30/2017 1455  NA 143 03/28/2014 0625   K 3.8 03/10/2020 1551   K 3.9 03/28/2014 0625   CL 106 03/10/2020 1551   CL 110 (H) 03/28/2014 0625   CO2 24 03/10/2020 1551   CO2 30 03/28/2014 0625   BUN 21  03/10/2020 1551   BUN 14 11/30/2017 1455   BUN 12 03/28/2014 0625   CREATININE 1.31 03/10/2020 1551   CREATININE 1.06 07/25/2017 1009   GLU 98 04/01/2015 0000      Component Value Date/Time   CALCIUM 9.4 03/10/2020 1551   CALCIUM 8.5 03/28/2014 0625   ALKPHOS 76 03/10/2020 1551   ALKPHOS 65 03/26/2014 1930   AST 19 03/10/2020 1551   AST 20 03/26/2014 1930   ALT 19 03/10/2020 1551   ALT 32 03/26/2014 1930   BILITOT 0.4 03/10/2020 1551   BILITOT 0.7 03/26/2014 1930     Lab Results  Component Value Date   HGBA1C 5.8 03/10/2020   Assessment and Plan:  Post Covid symptoms to include: fatigue, headaches-not new, intermittent dizziness, very poor energy, heavy legs. His main concern today is heavy legs and severe fatigue.  I reviewed all past encounters after Jan Covid, labs and imaging reports.  Work up for hypothyroid, routine chemistries, CBC with diff, ESR, CRP. If negative, consider medication SE contributing to fatigue. Ask him to complete PHQ-9 for depression screening. He is over due for Oncology follow up and he will make his appt. Pending lab results, he is advised to follow-up with Neurology for consideration of EMG testing, nerve conduction testing. He thinks he may be a long-hauler with Covid problems and that is also possible. Advised to get the tests completed already ordered by Cardiology and Pulmonology.  Follow Up Instructions: TBD I discussed the assessment and treatment plan with the patient. The patient was provided an opportunity to ask questions and all were answered. The patient agreed with the plan and demonstrated an understanding of the instructions.     The patient was advised to call back or seek an in-person evaluation if the symptoms worsen or if the condition fails to improve as anticipated.   I provided 20 minutes of non-face-to-face time during this encounter.

## 2020-04-14 NOTE — Patient Instructions (Addendum)
Please go to the [facility] lab for blood work ordered today.  We will check your thyroid, chemistries, CBC with differential, inflammatory markers.  Please make appt with your Oncologist and Neurologist.  Please complete any tests ordered by Cardiology and Pulmonology if not already done.   Please get rest, good sleep hygiene, good nutrition.   Follow-up pending lab results.

## 2020-04-14 NOTE — Progress Notes (Deleted)
Established Patient Office Visit  Subjective:  Patient ID: CUB GIANNINI, male    DOB: July 18, 1956  Age: 64 y.o. MRN: [id number]  CC:  Chief Complaint  Patient presents with  . 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 These problems are actively followed by Neurology, Cardiology, and Pulmonology.   He has migraine with aura, insomnia, sleep apnea and numbness /tingling bilateral hands and feet concerning for neuropathy and started on gabapentin  at night, and B12 supplements.He says the medicines have helped a little. He just has overall poor energy and he tried walking yest- and legs so heavy and sluggish  that he could barely lift them up. He just does not feel well. No fevers or chills  No cough, congestion, chest pain. No calf pain or edema. He checked his vital signs before this phone call and   bp 135/97- 67-98.5 97%    Subsequent  Cardiology visit for edema, weight gain, orthopnea and echo to evaluate for new cardiomyopathy revealed EF60-65% . Placed on Lasix, amlodipine dose adjusted. He has non-obstructive CAD per cath and cardiac CTA. Saw Cardiology 03/26/2020 for leg pain and had ABIs to exclude underlying PAD.   He has reported ongoing problems with cough and has been followed by PULM with recent PFTS orderd.     He had cough with hemoptysis and was seen in the [facility]  with CXR 02/19/2020: no acute changes. He saw PULM 03/06/2020: fatigue , chills, decreased appetite, trouble sleeping, lower  extremity edema, lower extremity paresthesia.  Chronic cough with mixed etiology with longstanding GERD history as well as chronic rhinitis status post septoplasty, fatigue dyspnea related to Covid, OSA, not using CPAP, palpitations. PFTS were ordered.   He saw Neurology 5=03/26/2020: for migraine f/up Uisng Cpap   presents for Covid Infection in January Had an occular migraine last night 0400 and gone 45 min.  Past Medical History:  Diagnosis Date  . Complication of anesthesia    slow to wake  . Coronary artery disease, non-occlusive    a. LHC 12/18: ostLAD 20%, p/mLAD 60% FFR 0.84, ost ramus 50%, mRCA 50% FFR 0.94, EF 65%  . Diverticulitis   . Diverticulosis   . Dyslipidemia   . Family history of adverse reaction to anesthesia    Mother - slow to wake  . GERD (gastroesophageal reflux disease)   . Hemorrhoids   . Hiatal hernia   . History of 2019 novel coronavirus disease (COVID-19) 12/24/2019  . History of echocardiogram    a. TTE 1/19: EF of 60-65%, normal wall motion, normal LV diastolic function, mildly dilated LA  . Labile hypertension   . Lymphoma ([place])   . Medial epicondylitis of right elbow   . Meralgia paresthetica of right side   . Microscopic hematuria   . Ocular migraine   . Sleep apnea    CPAP    Past Surgical History:  Procedure Laterality Date  . INTRAVASCULAR PRESSURE WIRE/FFR STUDY N/A 12/07/2017   Procedure: INTRAVASCULAR PRESSURE WIRE/FFR STUDY;  Surgeon: Nelva Bush, MD;  Location: [place] [facility];  Service: Cardiovascular;  Laterality: N/A;  . LEFT HEART CATH AND CORONARY ANGIOGRAPHY Left 12/07/2017   Procedure: LEFT HEART CATH AND CORONARY ANGIOGRAPHY;  Surgeon: Nelva Bush, MD;  Location: [place] [facility];  Service: Cardiovascular;  Laterality: Left;  . nissen funduplication  [id number]   [name]  . SEPTOPLASTY Bilateral 08/23/2019   Procedure: SEPTOPLASTY;  Surgeon: [name], MD;  Location: [place];  Service: ENT;   Laterality: Bilateral;  . SPLENECTOMY    . TURBINATE REDUCTION Bilateral 08/23/2019   Procedure: TURBINATE REDUCTION;  Surgeon: [name], MD;  Location: [place];  Service: ENT;  Laterality: Bilateral;  Latex sensitivity sleep apnea    Family History  Problem Relation Age of Onset  . Coronary artery disease Father 25       CABG  . Aortic aneurysm Father 39       repaired  . Hyperlipidemia Father   . Lung cancer Mother   . Hypertension Mother   . Cancer Mother        bladder  . COPD Mother   . Stomach cancer Maternal Grandfather     Social History   Socioeconomic History  . Marital status: Married    Spouse name: Not on file  . Number of children: 2  . Years of education: 63  . Highest education level: Not on file  Occupational History  . Occupation: Shop Printmaker: [employer]    Comment: [place]  Tobacco Use  . Smoking status: Never Smoker  . Smokeless tobacco: Never Used  Substance and Sexual Activity  . Alcohol use: No  . Drug use: No  . Sexual activity: Yes  Other Topics Concern  . Not on file  Social History Narrative  . Not on file   Social Determinants of Health   Financial Resource Strain:   . Difficulty of Paying Living Expenses:   Food Insecurity:   . Worried About Charity fundraiser in the Last Year:   . Arboriculturist in the Last Year:   Transportation Needs:   . Film/video editor (Medical):   [name] Lack of Transportation (Non-Medical):   Physical Activity:   . Days of Exercise per Week:   . Minutes of Exercise per Session:   Stress:   . Feeling of Stress :   Social Connections:   . Frequency of Communication with Friends and Family:   . Frequency of Social Gatherings with Friends and Family:   . Attends Religious Services:   . Active Member of Clubs or Organizations:   . Attends Archivist Meetings:   [name] Marital Status:   Intimate Partner Violence:   . Fear of Current or Ex-Partner:   .  Emotionally Abused:   [name] Physically Abused:   . Sexually Abused:     Outpatient Medications Prior to Visit  Medication Sig Dispense Refill  . amLODipine (NORVASC) 5 MG tablet Take 5 mg by mouth daily.    . benzonatate (TESSALON) 200 MG capsule Take 1 capsule (200 mg total) by mouth 3 (three) times daily as needed for cough. 60 capsule 1  . carvedilol (COREG) 25 MG tablet Take 1 tablet (25 mg total) by mouth 2 (two) times daily. 120 tablet 2  . cyanocobalamin (,VITAMIN B-12,) 1000 MCG/ML injection INJECT 1 ML INTO THE MUSCLE DAILY FOR 3 DAYS, THEN WEEKLY FOR 4 WEEKS , THEN MONTHLY THEREAFTER 6  mL 5  . dexlansoprazole (DEXILANT) 60 MG capsule Take 60 mg by mouth 2 (two) times daily.    [name] ezetimibe (ZETIA) 10 MG tablet Take 1 tablet (10 mg total) by mouth daily. 90 tablet 3  . furosemide (LASIX) 40 MG tablet Take 1 tablet (40 mg total) by mouth daily. 90 tablet 1  . Galcanezumab-gnlm (EMGALITY) 120 MG/ML SOAJ Inject 120 mg into the skin every 30 (thirty) days.    [name] LORazepam (ATIVAN) 1 MG tablet Take 1 tablet (1 mg total) by mouth every 8 (eight) hours as needed. 30 tablet 3  . losartan (COZAAR) 100 MG tablet TAKE 1 TABLET BY MOUTH EVERY DAY 90 tablet 0  . nitroGLYCERIN (NITROSTAT) 0.4 MG SL tablet Place 1 tablet (0.4 mg total) under the tongue every 5 (five) minutes as needed for chest pain. Maximum of 3 doses. 35 tablet 3  . ONETOUCH VERIO test strip USE TO CHECK BLOOD SUGAR AS NEEDED 100 strip 2  . prochlorperazine (COMPAZINE) 5 MG tablet Take 5 mg by mouth every 6 (six) hours as needed for nausea or vomiting.    . Prucalopride Succinate (MOTEGRITY) 2 MG TABS Take 1 tablet by mouth daily.    [name] rOPINIRole (REQUIP) 0.25 MG tablet Take by mouth.    . rosuvastatin (CRESTOR) 20 MG tablet Take 1 tablet (20 mg total) by mouth daily. 30 tablet 11  . senna (SENOKOT) 8.6 MG TABS tablet Take 1 tablet by mouth.    . SYMBICORT 80-4.5 MCG/ACT inhaler Inhale 2 puffs into the lungs 2 (two) times daily. 1  Inhaler 5  . Syringe/Needle, Disp, (SYRINGE 3CC/25GX1") 25G X 1" 3 ML MISC Use for b12 injections 50 each 0  . zolpidem (AMBIEN) 10 MG tablet TAKE 1 TABLET (10 MG TOTAL) BY MOUTH AT BEDTIME AS NEEDED FOR SLEEP. 30 tablet 5   No facility-administered medications prior to visit.    Allergies  Allergen Reactions  . Avelox [Moxifloxacin] Hives and Swelling    Severe facial swelling  . Contrast Media [Iodinated Diagnostic Agents] Swelling    (OK with pre-medication)  . Crestor [Rosuvastatin Calcium]     myalgia  . Morphine And Related Itching  . Niacin-Simvastatin Er     myalgia  . Latex Rash    Gloves cause raxh  . Tape Dermatitis    ROS Review of Systems    Objective:    Physical Exam  Ht 6\' 2"  (1.88 m)   Wt 200 lb (90.7 kg)   BMI 25.68 kg/m  Wt Readings from Last 3 Encounters:  04/14/20 200 lb (90.7 kg)  03/26/20 209 lb 2 oz (94.9 kg)  03/06/20 210 lb (95.3 kg)     Health Maintenance Due  Topic Date Due  . Hepatitis C Screening  Never done  . HIV Screening  Never done  . TETANUS/TDAP  08/25/2018    There are no preventive care reminders to display for this patient.  Lab Results  Component Value Date   TSH 1.99 11/21/2017   Lab Results  Component Value Date   WBC 7.2 07/20/2018   HGB 15.2 07/20/2018   HCT 44.7 07/20/2018   MCV 90.7 07/20/2018   PLT 420.0 (H) 07/20/2018   Lab Results  Component Value Date   NA 137 03/10/2020   K 3.8 03/10/2020   CO2 24 03/10/2020   GLUCOSE 124 (H) 03/10/2020   BUN 21 03/10/2020   CREATININE 1.31 03/10/2020   BILITOT 0.4 03/10/2020   ALKPHOS 76 03/10/2020  AST 19 03/10/2020   ALT 19 03/10/2020   PROT 7.1 03/10/2020   ALBUMIN 4.3 03/10/2020   CALCIUM 9.4 03/10/2020   ANIONGAP 10 11/28/2019   GFR 55.17 (L) 03/10/2020   Lab Results  Component Value Date   CHOL 149 03/10/2020   Lab Results  Component Value Date   HDL 30.50 (L) 03/10/2020   Lab Results  Component Value Date   LDLCALC 127 (H) 11/28/2019    Lab Results  Component Value Date   TRIG 233.0 (H) 03/10/2020   Lab Results  Component Value Date   CHOLHDL 5 03/10/2020   Lab Results  Component Value Date   HGBA1C 5.8 03/10/2020      Assessment & Plan:   Problem List Items Addressed This Visit      Other   Fatigue   Relevant Orders   B12 and Folate Panel   Thyroid Panel With TSH   CBC with Differential/Platelet   Sedimentation rate   C-reactive protein   History of COVID-19   Relevant Orders   B12 and Folate Panel   Thyroid Panel With TSH   CBC with Differential/Platelet   Sedimentation rate   C-reactive protein    Other Visit Diagnoses    Leg weakness, bilateral    -  Primary   Relevant Orders   B12 and Folate Panel   Thyroid Panel With TSH   CBC with Differential/Platelet   Sedimentation rate   C-reactive protein      No orders of the defined types were placed in this encounter.  Needs to see Dr. Manuella Ghazi  Follow-up: No follow-ups on file.   f/up oncology and  Neuro     Denice Paradise, NP

## 2020-04-15 ENCOUNTER — Other Ambulatory Visit
Admission: RE | Admit: 2020-04-15 | Discharge: 2020-04-15 | Disposition: A | Discharge status: Home / Self Care | Payer: 59 | Source: Ambulatory Visit | Attending: *Deleted | Admitting: *Deleted

## 2020-04-15 DIAGNOSIS — R29898 Other symptoms and signs involving the musculoskeletal system: Secondary | ICD-10-CM | POA: Insufficient documentation

## 2020-04-15 DIAGNOSIS — R5382 Chronic fatigue, unspecified: Secondary | ICD-10-CM | POA: Insufficient documentation

## 2020-04-15 DIAGNOSIS — Z8616 Personal history of COVID-19: Secondary | ICD-10-CM | POA: Insufficient documentation

## 2020-04-15 LAB — VITAMIN B12: Vitamin B-12: 431 pg/mL (ref 180–914)

## 2020-04-15 LAB — COMPREHENSIVE METABOLIC PANEL
ALT: 26 U/L (ref 0–44)
AST: 24 U/L (ref 15–41)
Albumin: 4.3 g/dL (ref 3.5–5.0)
Alkaline Phosphatase: 76 U/L (ref 38–126)
Anion gap: 9 (ref 5–15)
BUN: 24 mg/dL — ABNORMAL HIGH (ref 8–23)
CO2: 24 mmol/L (ref 22–32)
Calcium: 9.3 mg/dL (ref 8.9–10.3)
Chloride: 107 mmol/L (ref 98–111)
Creatinine, Ser: 0.96 mg/dL (ref 0.61–1.24)
GFR calc Af Amer: >60 mL/min (ref >60)
GFR calc non Af Amer: >60 mL/min (ref >60)
Glucose, Bld: 122 mg/dL — ABNORMAL HIGH (ref 70–99)
Potassium: 3.8 mmol/L (ref 3.5–5.1)
Sodium: 140 mmol/L (ref 135–145)
Total Bilirubin: 0.7 mg/dL (ref 0.3–1.2)
Total Protein: 7.5 g/dL (ref 6.5–8.1)

## 2020-04-15 LAB — CBC WITH DIFFERENTIAL/PLATELET
Abs Immature Granulocytes: 0.02 10*3/uL (ref 0.00–0.07)
Basophils Absolute: 0 10*3/uL (ref 0.0–0.1)
Basophils Relative: 1 %
Eosinophils Absolute: 0.2 10*3/uL (ref 0.0–0.5)
Eosinophils Relative: 2 %
HCT: 47.1 % (ref 39.0–52.0)
Hemoglobin: 16.1 g/dL (ref 13.0–17.0)
Immature Granulocytes: 0 %
Lymphocytes Relative: 31 %
Lymphs Abs: 2.5 10*3/uL (ref 0.7–4.0)
MCH: 30.7 pg (ref 26.0–34.0)
MCHC: 34.2 g/dL (ref 30.0–36.0)
MCV: 89.7 fL (ref 80.0–100.0)
Monocytes Absolute: 1 10*3/uL (ref 0.1–1.0)
Monocytes Relative: 12 %
Neutro Abs: 4.4 10*3/uL (ref 1.7–7.7)
Neutrophils Relative %: 54 %
Platelets: 324 10*3/uL (ref 150–400)
RBC: 5.25 MIL/uL (ref 4.22–5.81)
RDW: 13.9 % (ref 11.5–15.5)
WBC: 8.2 10*3/uL (ref 4.0–10.5)
nRBC: 0 % (ref 0.0–0.2)

## 2020-04-15 LAB — SEDIMENTATION RATE: Sed Rate: 11 mm/hr (ref 0–20)

## 2020-04-15 LAB — C-REACTIVE PROTEIN: CRP: 1.7 mg/dL — ABNORMAL HIGH (ref <1.0)

## 2020-04-15 LAB — CK: Total CK: 91 U/L (ref 49–397)

## 2020-04-15 LAB — FOLATE: Folate: 25 ng/mL (ref >5.9)

## 2020-04-16 LAB — THYROID PANEL WITH TSH
Free Thyroxine Index: 1.6 (ref 1.2–4.9)
T3 Uptake Ratio: 22 % — ABNORMAL LOW (ref 24–39)
T4, Total: 7.1 ug/dL (ref 4.5–12.0)
TSH: 1.37 u[IU]/mL (ref 0.450–4.500)

## 2020-04-16 LAB — ANA: Anti Nuclear Antibody (ANA): NEGATIVE

## 2020-04-21 ENCOUNTER — Ambulatory Visit (INDEPENDENT_AMBULATORY_CARE_PROVIDER_SITE_OTHER): Payer: 59

## 2020-04-21 ENCOUNTER — Other Ambulatory Visit: Payer: Self-pay

## 2020-04-21 DIAGNOSIS — M79604 Pain in right leg: Secondary | ICD-10-CM

## 2020-04-21 DIAGNOSIS — M79605 Pain in left leg: Secondary | ICD-10-CM

## 2020-04-23 ENCOUNTER — Telehealth: Payer: Self-pay

## 2020-04-23 NOTE — Telephone Encounter (Signed)
Call to patient to review ABI results.    Pt wife verbalized understanding and has no further questions at this time.    Advised pt to call for any further questions or concerns.  No further orders.  Confirmed upcoming appt in July.

## 2020-04-23 NOTE — Telephone Encounter (Signed)
-----   Message from Nelva Bush, MD sent at 04/23/2020  7:57 AM EDT ----- Please let Mr. Maidonado know that his ABI's are normal without evidence of a significant blockage/narrowing in the arteries supplying either leg.

## 2020-04-29 ENCOUNTER — Ambulatory Visit: Payer: 59

## 2020-05-03 ENCOUNTER — Ambulatory Visit: Payer: 59 | Attending: Internal Medicine

## 2020-05-03 ENCOUNTER — Other Ambulatory Visit: Payer: Self-pay

## 2020-05-03 DIAGNOSIS — Z23 Encounter for immunization: Secondary | ICD-10-CM

## 2020-05-03 NOTE — Progress Notes (Signed)
   Covid-19 Vaccination Clinic  Name:  Brian Atkins    MRN: [id number] DOB: [birth year]  05/03/2020  Mr. Beans was observed post Covid-19 immunization for 15 minutes without incident. He was provided with Vaccine Information Sheet and instruction to access the V-Safe system.   [name] was instructed to call 911 with any severe reactions post vaccine: [name] Difficulty breathing  . Swelling of face and throat  . A fast heartbeat  . A bad rash all over body  . Dizziness and weakness   Immunizations Administered    Name Date Dose VIS Date Route   Pfizer COVID-19 Vaccine 05/03/2020 10:05 AM 0.3 mL 02/13/2019 Intramuscular   Manufacturer: [employer]   Lot: [id number]   [place]: [id number]

## 2020-06-11 ENCOUNTER — Other Ambulatory Visit: Payer: Self-pay | Admitting: Internal Medicine

## 2020-06-27 ENCOUNTER — Other Ambulatory Visit: Payer: Self-pay | Admitting: Internal Medicine

## 2020-06-30 NOTE — Progress Notes (Deleted)
Cardiology Office Note    Date:  06/30/2020   ID:  DIN BOOKWALTER, DOB 05/31/1956, MRN [id number]  PCP:  [name], MD  Cardiologist:  Nelva Bush, MD  Electrophysiologist:  None   Chief Complaint: Follow up  History of Present Illness:   Brian Atkins is a 64 y.o. male with history of nonobstructive CAD by [place] in 11/2017 and coronary CTA in 11/2019, marginal zone B-cell lymphoma, HTN, COVID-19 in 12/2019, and GERD status post Nissen fundoplication who presents for follow-up of ***.  He was evaluated in 11/2017 for chest pain, palpitations, headaches, and elevated BP.  He subsequently underwent LHC which revealed moderate nonobstructive CAD involving the proximal/mid LAD and mid RCA. Both lesions were not hemodynamically significant by FFR (FFR of LAD 0.84, FFR RCA 0.94). LVEF greater than 65%, moderately elevated LVEDP consistent with diastolic dysfunction. Medical therapy was recommended. Echo 12/29/17 showed an EF of 60-65%, normal wall motion, normal LV diastolic function, mildly dilated LA. he was seen in 11/2019 with recurrent chest pain and underwent coronary CTA which showed a calcium score of 496 and was the 86 percentile for age and sex matched control.  He had mild diffuse nonobstructive CAD with calcified plaque noted in the left main, LAD, LCx, and RCA with a maximum identified stenosis of 25 to 49%.  Medical therapy was recommended.  He was diagnosed with Covid in 12/2019, and did not require hospital admission.  He was seen in 01/2020 continuing to note persistent exertional dyspnea, fatigue, and cough following his COVID-19 infection.  He reported two-pillow orthopnea and a 5 pound weight gain despite poor appetite since contracting Covid.  In this setting, he underwent echo in 01/2020 which showed a normal LVEF with grade 1 diastolic dysfunction and no significant valvular abnormalities.  He was evaluated by pulmonology in 02/2020 who felt like his symptoms were likely due to  uncontrolled sleep apnea, GERD, and chronic rhinitis.  PFTs were recommended.  He was last seen by cardiology in 03/2020 noting multiple complaints including a several week history of numbness in his legs and feet that was most pronounced when lying down.  He had previously been evaluated by neurology for this with noted intolerance to gabapentin and Topamax.  He has subsequently followed up with neurology for this issue and was started on Requip.  He also noted exertional dyspnea that vary from one day to the next.  In the setting of lower extremity swelling, he had increased his Lasix to 40 mg daily and decreased his amlodipine with improvement in symptoms.  PFTs were arranged for in [place] for convenience, though not yet completed.  He was continued on Lasix 40 mg daily.  His leg pain was most consistent with a neuropathic process though given his risk factors he underwent lower extremity ABIs which were normal.  With noted improvement in lower extremity swelling his Lasix has subsequently been transitioned to as needed dosing.  ***   Labs independently reviewed: 03/2020 - TSH normal, Hgb 16.1, PLT 324, potassium 3.8, BUN 24, serum creatinine 0.96, BUN 4.3, AST/ALT normal 02/2020 - direct LDL 98, TC 149, TG 233, HDL 30, A1c 5.8  Past Medical History:  Diagnosis Date  . Complication of anesthesia    slow to wake  . Coronary artery disease, non-occlusive    a. LHC 12/18: ostLAD 20%, p/mLAD 60% FFR 0.84, ost ramus 50%, mRCA 50% FFR 0.94, EF 65%  . Diverticulitis   . Diverticulosis   . Dyslipidemia   .  Family history of adverse reaction to anesthesia    Mother - slow to wake  . GERD (gastroesophageal reflux disease)   . Hemorrhoids   . Hiatal hernia   . History of 2019 novel coronavirus disease (COVID-19) 12/24/2019  . History of echocardiogram    a. TTE 1/19: EF of 60-65%, normal wall motion, normal LV diastolic function, mildly dilated LA  . Labile hypertension   . Lymphoma ([place])   .  Medial epicondylitis of right elbow   . Meralgia paresthetica of right side   . Microscopic hematuria   . Ocular migraine   . Sleep apnea    CPAP    Past Surgical History:  Procedure Laterality Date  . INTRAVASCULAR PRESSURE WIRE/FFR STUDY N/A 12/07/2017   Procedure: INTRAVASCULAR PRESSURE WIRE/FFR STUDY;  Surgeon: Nelva Bush, MD;  Location: [place] [facility];  Service: Cardiovascular;  Laterality: N/A;  . LEFT HEART CATH AND CORONARY ANGIOGRAPHY Left 12/07/2017   Procedure: LEFT HEART CATH AND CORONARY ANGIOGRAPHY;  Surgeon: Nelva Bush, MD;  Location: [place] [facility];  Service: Cardiovascular;  Laterality: Left;  . nissen funduplication  2542   [name]  . SEPTOPLASTY Bilateral 08/23/2019   Procedure: SEPTOPLASTY;  Surgeon: [name], MD;  Location: [place];  Service: ENT;  Laterality: Bilateral;  . SPLENECTOMY    . TURBINATE REDUCTION Bilateral 08/23/2019   Procedure: TURBINATE REDUCTION;  Surgeon: [name], MD;  Location: [place];  Service: ENT;  Laterality: Bilateral;  Latex sensitivity sleep apnea    Current Medications: No outpatient medications have been marked as taking for the 07/04/20 encounter (Appointment) with Rise Mu, PA-C.    Allergies:   Avelox [moxifloxacin], Contrast media [iodinated diagnostic agents], Crestor [rosuvastatin calcium], Morphine and related, Niacin-simvastatin er, Latex, and Tape   Social History   Socioeconomic History  . Marital status: Married    Spouse name: Not on file  . Number of children: 2  . Years of education: 85  . Highest education level: Not on file  Occupational History  . Occupation: Shop Printmaker: [employer]    Comment: [place]  Tobacco Use  . Smoking status: Never Smoker  . Smokeless tobacco: Never Used  Vaping Use  . Vaping Use: Never used  Substance and Sexual Activity  . Alcohol use: No  . Drug use: No  . Sexual activity: Yes  Other Topics  Concern  . Not on file  Social History Narrative  . Not on file   Social Determinants of Health   Financial Resource Strain:   . Difficulty of Paying Living Expenses:   Food Insecurity:   . Worried About Charity fundraiser in the Last Year:   . Arboriculturist in the Last Year:   Transportation Needs:   . Film/video editor (Medical):   [name] Lack of Transportation (Non-Medical):   Physical Activity:   . Days of Exercise per Week:   . Minutes of Exercise per Session:   Stress:   . Feeling of Stress :   Social Connections:   . Frequency of Communication with Friends and Family:   . Frequency of Social Gatherings with Friends and Family:   . Attends Religious Services:   . Active Member of Clubs or Organizations:   . Attends Archivist Meetings:   [name] Marital Status:      Family History:  The patient's family history includes Aortic aneurysm (age of onset: 11) in his  father; COPD in his mother; Cancer in his mother; Coronary artery disease (age of onset: 8) in his father; Hyperlipidemia in his father; Hypertension in his mother; Lung cancer in his mother; Stomach cancer in his maternal grandfather.  ROS:   ROS   EKGs/Labs/Other Studies Reviewed:    Studies reviewed were summarized above. The additional studies were reviewed today:  LHC 11/2017: Conclusions: 1. Moderate, non-obstructive coronary artery disease involving the proximal/mid LAD and mid RCA. Both lesions are not hemodynamically significant by FFR. 2. Hyperdynamic left ventricular contraction (LVEF >65%). 3. Moderately elevated left ventricular filling pressure, consistent with diastolic dysfunction.  Recommendations: 1. Medical therapy and risk factor modification to prevent progression of CAD. Will increase carvedilol to 6.25 mg BID and add atorvastatin 20 mg daily. 2. Initiate furosemide 20 mg daily and repeat BMP in ~2 weeks. 3. Consider transthoracic echo to evaluate for other structural  abnormalities. __________  2D Echo 12/2017: - Left ventricle: The cavity size was normal. There was mild concentric hypertrophy. Systolic function was normal. The estimated ejection fraction was in the range of 60% to 65%. Wall motion was normal; there were no regional wall motion abnormalities. Left ventricular diastolic function parameters were normal. - Left atrium: The atrium was mildly dilated. __________  Coronary CTA 11/2019: IMPRESSION: 1. Mild diffuse nonobstructive CAD, CADRADS = 2. There is calcified plaque seen in left main, LAD, LCx, and RCA. Maximum identified stenosis of 25-49%. Of note, distal RCA incompletely filled with contrast and cannot evaluate stenosis in that portion.  2. Coronary calcium score of 496. This was 86th percentile for age and sex matched control.  3. Normal coronary origin with right dominance.  4.  Recommend aggressive medical management. __________  2D echo 01/2020: 1. Left ventricular ejection fraction, by estimation, is 60 to 65%. The  left ventricle has normal function. The left ventricle has no regional  wall motion abnormalities. There is mild left ventricular hypertrophy.  Left ventricular diastolic parameters  are consistent with Grade I diastolic dysfunction (impaired relaxation).  2. Right ventricular systolic function is normal. The right ventricular  size is normal. Tricuspid regurgitation signal is inadequate for assessing  PA pressure.  3. The mitral valve is normal in structure and function. Trivial mitral  valve regurgitation. No evidence of mitral stenosis.  4. The aortic valve is tricuspid. Aortic valve regurgitation is not  visualized. No aortic stenosis is present. __________   EKG:  EKG is ordered today.  The EKG ordered today demonstrates ***  Recent Labs: 04/15/2020: ALT 26; BUN 24; Creatinine, Ser 0.96; Hemoglobin 16.1; Platelets 324; Potassium 3.8; Sodium 140; TSH 1.370  Recent Lipid Panel      Component Value Date/Time   CHOL 149 03/10/2020 1551   TRIG 233.0 (H) 03/10/2020 1551   HDL 30.50 (L) 03/10/2020 1551   CHOLHDL 5 03/10/2020 1551   VLDL 46.6 (H) 03/10/2020 1551   LDLCALC 127 (H) 11/28/2019 0807   LDLDIRECT 98.0 03/10/2020 1551    PHYSICAL EXAM:    VS:  There were no vitals taken for this visit.  BMI: There is no height or weight on file to calculate BMI.  Physical Exam  Wt Readings from Last 3 Encounters:  04/14/20 200 lb (90.7 kg)  03/26/20 209 lb 2 oz (94.9 kg)  03/06/20 210 lb (95.3 kg)     ASSESSMENT & PLAN:   1. ***  2. HTN: Blood pressure *** And if LDL does not 3. HLD: LDL of 98.  He remains on Crestor  and Zetia.  ***  Disposition: F/u with [name] or an APP in ***.   Medication Adjustments/Labs and Tests Ordered: Current medicines are reviewed at length with the patient today.  Concerns regarding medicines are outlined above. Medication changes, Labs and Tests ordered today are summarized above and listed in the Patient Instructions accessible in Encounters.   Signed, [name], PA-C 06/30/2020 2:09 PM     [address] [place], [place] [id number] [phone]

## 2020-07-04 ENCOUNTER — Ambulatory Visit: Payer: 59 | Admitting: Physician Assistant

## 2020-07-25 NOTE — Progress Notes (Signed)
Cardiology Office Note    Date:  08/08/2020   ID:  Brian Atkins, Brian Atkins 02/17/1956, MRN [id number]  PCP:  [name], MD  Cardiologist:  Nelva Bush, MD  Electrophysiologist:  None   Chief Complaint: Follow up  History of Present Illness:   Brian Atkins is a 64 y.o. male with history of nonobstructive CAD by [facility] in 11/2017 and coronary CTA in 11/2019, marginal zone B-cell lymphoma, HTN, COVID-19 in 12/2019, and GERD status post Nissen fundoplication who presents for follow-up of dyspnea.  He was evaluated in 11/2017 for chest pain, palpitations, headaches, and elevated BP.  He subsequently underwent LHC which revealed moderate nonobstructive CAD involving the proximal/mid LAD and mid RCA. Both lesions were not hemodynamically significant by fractional flow reserve (FFR of LAD 0.84, FFR RCA 0.94). LVEF greater than 65%, moderately elevated LVEDP consistent with diastolic dysfunction. Medical therapy was recommended. Echo 12/29/17 showed an EF of 60-65%, normal wall motion, normal LV diastolic function, mildly dilated LA.  He was seen in 11/2019 with recurrent chest pain and underwent coronary CTA which showed a calcium score of 496 and was the 86th percentile for age and sex matched control.  He had mild diffuse nonobstructive CAD with calcified plaque noted in the left main, LAD, LCx, and RCA with a maximum identified stenosis of 25 to 49%.  Medical therapy was recommended.  He was diagnosed with Covid in 12/2019, and did not require hospital admission.  He was seen in 01/2020 continuing to note persistent exertional dyspnea, fatigue, and cough following his COVID-19 infection.  He reported two-pillow orthopnea and a 5 pound weight gain despite poor appetite since contracting Covid.  In this setting, he underwent echo in 01/2020 which showed a normal LVEF with grade 1 diastolic dysfunction and no significant valvular abnormalities.  He was evaluated by pulmonology in 02/2020 who felt like his  symptoms were likely due to uncontrolled sleep apnea, GERD, and chronic rhinitis.  PFTs were recommended.  He was last seen by cardiology in 03/2020 noting multiple complaints including a several week history of numbness in his legs and feet that was most pronounced when lying down.  He had previously been evaluated by neurology for this with noted intolerance to gabapentin and Topamax.  He has subsequently followed up with neurology for this issue and was started on Requip.  He also noted exertional dyspnea that varied from one day to the next.  In the setting of lower extremity swelling, he had increased his Lasix to 40 mg daily and decreased his amlodipine with improvement in symptoms.  PFTs were arranged for in [place] for convenience, though not yet completed.  He was continued on Lasix 40 mg daily.  His leg pain was most consistent with a neuropathic process though given his risk factors he underwent lower extremity ABIs which were normal.  With noted improvement in lower extremity swelling his Lasix has subsequently been transitioned to as needed dosing.  He comes in doing well from a cardiac perspective.  He continues to note some exertional shortness of breath which has been noted since his COVID-19 infection earlier this year.  With this shortness of breath, he has recently restarted previously prescribed Symbicort.  He is uncertain if this is helping yet.  No exertional chest pain.  He also notes some intermittent palpitations at times are described as a heart pounding sensation when laying down at night and at other times as a fast fluttering sensation and typically occur 2-3  times per day lasting for several seconds in duration.  He continues to note some intermittent dizziness that at times is positional and at other times will occur randomly.  He is currently taking as needed Lasix about once maybe every couple of weeks when his lower extremity swelling worsens.  He tries to stay hydrated though  does not drink large quantities of water.   Labs independently reviewed: 03/2020 - TSH normal, Hgb 16.1, PLT 324, potassium 3.8, BUN 24, serum creatinine 0.96, BUN 4.3, AST/ALT normal 02/2020 - direct LDL 98, TC 149, TG 233, HDL 30, A1c 5.8   Past Medical History:  Diagnosis Date  . Complication of anesthesia    slow to wake  . Coronary artery disease, non-occlusive    a. LHC 12/18: ostLAD 20%, p/mLAD 60% FFR 0.84, ost ramus 50%, mRCA 50% FFR 0.94, EF 65%  . Diverticulitis   . Diverticulosis   . Dyslipidemia   . Family history of adverse reaction to anesthesia    Mother - slow to wake  . GERD (gastroesophageal reflux disease)   . Hemorrhoids   . Hiatal hernia   . History of 2019 novel coronavirus disease (COVID-19) 12/24/2019  . History of echocardiogram    a. TTE 1/19: EF of 60-65%, normal wall motion, normal LV diastolic function, mildly dilated LA  . Labile hypertension   . Lymphoma ([place])   . Medial epicondylitis of right elbow   . Meralgia paresthetica of right side   . Microscopic hematuria   . Ocular migraine   . Sleep apnea    CPAP    Past Surgical History:  Procedure Laterality Date  . INTRAVASCULAR PRESSURE WIRE/FFR STUDY N/A 12/07/2017   Procedure: INTRAVASCULAR PRESSURE WIRE/FFR STUDY;  Surgeon: Nelva Bush, MD;  Location: [facility];  Service: Cardiovascular;  Laterality: N/A;  . LEFT HEART CATH AND CORONARY ANGIOGRAPHY Left 12/07/2017   Procedure: LEFT HEART CATH AND CORONARY ANGIOGRAPHY;  Surgeon: Nelva Bush, MD;  Location: [facility];  Service: Cardiovascular;  Laterality: Left;  . nissen funduplication  [id number]   [name]  . SEPTOPLASTY Bilateral 08/23/2019   Procedure: SEPTOPLASTY;  Surgeon: [name], MD;  Location: [facility];  Service: ENT;  Laterality: Bilateral;  . SPLENECTOMY    . TURBINATE REDUCTION Bilateral 08/23/2019   Procedure: TURBINATE REDUCTION;  Surgeon: [name], MD;  Location: [facility];  Service: ENT;  Laterality: Bilateral;  Latex sensitivity sleep apnea    Current Medications: Current Meds  Medication Sig  . amLODipine (NORVASC) 5 MG tablet Take 5 mg by mouth daily.  . benzonatate (TESSALON) 200 MG capsule Take 1 capsule (200 mg total) by mouth 3 (three) times daily as needed for cough.  . carvedilol (COREG) 25 MG tablet Take 1 tablet (25 mg total) by mouth 2 (two) times daily.  . cyanocobalamin (,VITAMIN B-12,) 1000 MCG/ML injection INJECT 1 ML INTO THE MUSCLE DAILY FOR 3 DAYS, THEN WEEKLY FOR 4 WEEKS , THEN MONTHLY THEREAFTER  . dexlansoprazole (DEXILANT) 60 MG capsule Take 60 mg by mouth 2 (two) times daily.  [name] ezetimibe (ZETIA) 10 MG tablet Take 1 tablet (10 mg total) by mouth daily.  . furosemide (LASIX) 40 MG tablet Take 1 tablet (40 mg total) by mouth daily.  . Galcanezumab-gnlm (EMGALITY) 120 MG/ML SOAJ Inject 120 mg into the skin every 30 (thirty) days.  [name] LORazepam (ATIVAN) 1 MG tablet Take 1 tablet (1 mg total) by mouth every 8 (eight) hours as needed.  [name]  losartan (COZAAR) 100 MG tablet TAKE 1 TABLET BY MOUTH EVERY DAY  . nitroGLYCERIN (NITROSTAT) 0.4 MG SL tablet Place 1 tablet (0.4 mg total) under the tongue every 5 (five) minutes as needed for chest pain. Maximum of 3 doses.  Glory Rosebush VERIO test strip USE TO CHECK BLOOD SUGAR AS NEEDED  . prochlorperazine (COMPAZINE) 5 MG tablet Take 5 mg by mouth every 6 (six) hours as needed for nausea or vomiting.  . Prucalopride Succinate (MOTEGRITY) 2 MG TABS Take 1 tablet by mouth daily.  [name] rOPINIRole (REQUIP) 0.25 MG tablet Take by mouth.  . rosuvastatin (CRESTOR) 20 MG tablet Take 1 tablet (20 mg total) by mouth daily.  [name] senna (SENOKOT) 8.6 MG TABS tablet Take 1 tablet by mouth.  . SYMBICORT 80-4.5 MCG/ACT inhaler Inhale 2 puffs into the lungs 2 (two) times daily.  . Syringe/Needle, Disp, (SYRINGE 3CC/25GX1") 25G X 1" 3 ML MISC Use for b12 injections  . zolpidem (AMBIEN) 10 MG tablet TAKE 1 TABLET (10 MG  TOTAL) BY MOUTH AT BEDTIME AS NEEDED FOR SLEEP.  . [DISCONTINUED] topiramate (TOPAMAX) 50 MG tablet TAKE 0.5 TABLET BY MOUTH NIGHTLY FOR 2 WEEKS, THEN INCREASE TO 1 TABLET NIGHTLY    Allergies:   Avelox [moxifloxacin], Contrast media [iodinated diagnostic agents], Crestor [rosuvastatin calcium], Morphine and related, Niacin-simvastatin er, Latex, and Tape   Social History   Socioeconomic History  . Marital status: Married    Spouse name: Not on file  . Number of children: 2  . Years of education: 35  . Highest education level: Not on file  Occupational History  . Occupation: Shop Printmaker: [employer]    Comment: [employer]  Tobacco Use  . Smoking status: Never Smoker  . Smokeless tobacco: Never Used  Vaping Use  . Vaping Use: Never used  Substance and Sexual Activity  . Alcohol use: No  . Drug use: No  . Sexual activity: Yes  Other Topics Concern  . Not on file  Social History Narrative  . Not on file   Social Determinants of Health   Financial Resource Strain:   . Difficulty of Paying Living Expenses: Not on file  Food Insecurity:   . Worried About Charity fundraiser in the Last Year: Not on file  . Ran Out of Food in the Last Year: Not on file  Transportation Needs:   . Lack of Transportation (Medical): Not on file  . Lack of Transportation (Non-Medical): Not on file  Physical Activity:   . Days of Exercise per Week: Not on file  . Minutes of Exercise per Session: Not on file  Stress:   . Feeling of Stress : Not on file  Social Connections:   . Frequency of Communication with Friends and Family: Not on file  . Frequency of Social Gatherings with Friends and Family: Not on file  . Attends Religious Services: Not on file  . Active Member of Clubs or Organizations: Not on file  . Attends Archivist Meetings: Not on file  . Marital Status: Not on file     Family History:  The patient's family history includes Aortic aneurysm (age of  onset: 80) in his father; COPD in his mother; Cancer in his mother; Coronary artery disease (age of onset: 50) in his father; Hyperlipidemia in his father; Hypertension in his mother; Lung cancer in his mother; Stomach cancer in his maternal grandfather.  ROS:   Review of Systems  Constitutional: Positive  for malaise/fatigue. Negative for chills, diaphoresis, fever and weight loss.  HENT: Negative for congestion.   Eyes: Negative for discharge and redness.  Respiratory: Positive for shortness of breath. Negative for cough, sputum production and wheezing.   Cardiovascular: Positive for palpitations and leg swelling. Negative for chest pain, orthopnea, claudication and PND.  Gastrointestinal: Negative for abdominal pain, heartburn, nausea and vomiting.  Musculoskeletal: Negative for falls and myalgias.  Skin: Negative for rash.  Neurological: Positive for dizziness, weakness and headaches. Negative for tingling, tremors, sensory change, speech change, focal weakness and loss of consciousness.  Endo/Heme/Allergies: Does not bruise/bleed easily.  Psychiatric/Behavioral: Negative for substance abuse. The patient is not nervous/anxious.   All other systems reviewed and are negative.    EKGs/Labs/Other Studies Reviewed:    Studies reviewed were summarized above. The additional studies were reviewed today:  LHC 11/2017: Conclusions: 1. Moderate, non-obstructive coronary artery disease involving the proximal/mid LAD and mid RCA. Both lesions are not hemodynamically significant by FFR. 2. Hyperdynamic left ventricular contraction (LVEF >65%). 3. Moderately elevated left ventricular filling pressure, consistent with diastolic dysfunction.  Recommendations: 1. Medical therapy and risk factor modification to prevent progression of CAD. Will increase carvedilol to 6.25 mg BID and add atorvastatin 20 mg daily. 2. Initiate furosemide 20 mg daily and repeat BMP in ~2 weeks. 3. Consider transthoracic  echo to evaluate for other structural abnormalities. __________  2D Echo 12/2017: - Left ventricle: The cavity size was normal. There was mild concentric hypertrophy. Systolic function was normal. The estimated ejection fraction was in the range of 60% to 65%. Wall motion was normal; there were no regional wall motion abnormalities. Left ventricular diastolic function parameters were normal. - Left atrium: The atrium was mildly dilated. __________  Coronary CTA 11/2019: IMPRESSION: 1. Mild diffuse nonobstructive CAD, CADRADS = 2. There is calcified plaque seen in left main, LAD, LCx, and RCA. Maximum identified stenosis of 25-49%. Of note, distal RCA incompletely filled with contrast and cannot evaluate stenosis in that portion.  2. Coronary calcium score of 496. This was 86th percentile for age and sex matched control.  3. Normal coronary origin with right dominance.  4.  Recommend aggressive medical management. __________  2D echo 01/2020: 1. Left ventricular ejection fraction, by estimation, is 60 to 65%. The  left ventricle has normal function. The left ventricle has no regional  wall motion abnormalities. There is mild left ventricular hypertrophy.  Left ventricular diastolic parameters  are consistent with Grade I diastolic dysfunction (impaired relaxation).  2. Right ventricular systolic function is normal. The right ventricular  size is normal. Tricuspid regurgitation signal is inadequate for assessing  PA pressure.  3. The mitral valve is normal in structure and function. Trivial mitral  valve regurgitation. No evidence of mitral stenosis.  4. The aortic valve is tricuspid. Aortic valve regurgitation is not  visualized. No aortic stenosis is present.   EKG:  EKG is ordered today.  The EKG ordered today demonstrates sinus bradycardia, 59 bpm, no acute ST-T changes  Recent Labs: 04/15/2020: ALT 26; BUN 24; Creatinine, Ser 0.96; Hemoglobin 16.1;  Platelets 324; Potassium 3.8; Sodium 140; TSH 1.370  Recent Lipid Panel    Component Value Date/Time   CHOL 149 03/10/2020 1551   TRIG 233.0 (H) 03/10/2020 1551   HDL 30.50 (L) 03/10/2020 1551   CHOLHDL 5 03/10/2020 1551   VLDL 46.6 (H) 03/10/2020 1551   LDLCALC 127 (H) 11/28/2019 0807   LDLDIRECT 98.0 03/10/2020 1551    PHYSICAL EXAM:  VS:  BP 120/90 (BP Location: Left Arm, Patient Position: Sitting, Cuff Size: Normal)   Ht 6\' 2"  (1.88 m)   Wt 208 lb 12.8 oz (94.7 kg)   SpO2 93%   BMI 26.81 kg/m   BMI: Body mass index is 26.81 kg/m.  Physical Exam Constitutional:      Appearance: He is well-developed.  HENT:     Head: Normocephalic and atraumatic.  Eyes:     General:        Right eye: No discharge.        Left eye: No discharge.  Neck:     Vascular: No JVD.  Cardiovascular:     Rate and Rhythm: Normal rate and regular rhythm.     Pulses: No midsystolic click and no opening snap.          Posterior tibial pulses are 2+ on the right side and 2+ on the left side.     Heart sounds: Normal heart sounds, S1 normal and S2 normal. Heart sounds not distant. No murmur heard.  No friction rub.  Pulmonary:     Effort: Pulmonary effort is normal. No respiratory distress.     Breath sounds: Normal breath sounds. No decreased breath sounds, wheezing or rales.  Chest:     Chest wall: No tenderness.  Abdominal:     General: There is no distension.     Palpations: Abdomen is soft.     Tenderness: There is no abdominal tenderness.  Musculoskeletal:     Cervical back: Normal range of motion.     Right lower leg: Edema present.     Left lower leg: Edema present.     Comments: Trace bilateral pretibial edema  Skin:    General: Skin is warm and dry.     Nails: There is no clubbing.  Neurological:     Mental Status: He is alert and oriented to person, place, and time.  Psychiatric:        Speech: Speech normal.        Behavior: Behavior normal.        Thought Content: Thought  content normal.        Judgment: Judgment normal.     Wt Readings from Last 3 Encounters:  08/08/20 208 lb 12.8 oz (94.7 kg)  04/14/20 200 lb (90.7 kg)  03/26/20 209 lb 2 oz (94.9 kg)     ASSESSMENT & PLAN:   1. Palpitations: Schedule Zio patch.  Recent TSH normal.  Check BMP.  2. Exertional dyspnea/history of COVID-19: He continues to note ongoing dyspnea which has been problematic since his COVID-19 infection earlier this year.  Since he has had Covid he has undergone echo which showed preserved LV SF as above.  Prior ischemic evaluations, including diagnostic LHC and coronary CTA, have demonstrated nonobstructive disease.  Schedule patient for previously recommended PFTs.  Recommend he follow-up with pulmonology.  He has not needed his as needed Lasix more than approximately once every couple of weeks.  Weight is stable.  Check BMP.  3. Nonobstructive CAD: No symptoms concerning for angina.  Prior ischemic evaluation via diagnostic LHC and coronary CTA demonstrated nonobstructive disease.  Continue current medical therapy including amlodipine, carvedilol, Zetia, and Crestor.  No plans for further ischemic evaluation at this time.  4. HTN: Blood pressure reasonably controlled, with mildly elevated diastolic pressure.  Low-sodium diet recommended.  Otherwise, he will continue current medical therapy including amlodipine, carvedilol, losartan, and as needed Lasix.  5. HLD: LDL 98 from  02/2020 with normal LFT from 03/2020.  He remains on Crestor and Zetia.  Disposition: F/u with [name] or an APP in 2 to 3 months.   Medication Adjustments/Labs and Tests Ordered: Current medicines are reviewed at length with the patient today.  Concerns regarding medicines are outlined above. Medication changes, Labs and Tests ordered today are summarized above and listed in the Patient Instructions accessible in Encounters.   Signed, [name], PA-C 08/08/2020 3:18 PM     [address], [place] [address] [phone]

## 2020-08-08 ENCOUNTER — Encounter: Payer: Self-pay | Admitting: Physician Assistant

## 2020-08-08 ENCOUNTER — Ambulatory Visit: Payer: 59 | Admitting: Physician Assistant

## 2020-08-08 ENCOUNTER — Other Ambulatory Visit: Payer: Self-pay

## 2020-08-08 VITALS — BP 120/90 | HR 59 | Ht 74.0 in | Wt 208.8 lb

## 2020-08-08 DIAGNOSIS — R002 Palpitations: Secondary | ICD-10-CM | POA: Diagnosis not present

## 2020-08-08 DIAGNOSIS — E785 Hyperlipidemia, unspecified: Secondary | ICD-10-CM

## 2020-08-08 DIAGNOSIS — I10 Essential (primary) hypertension: Secondary | ICD-10-CM

## 2020-08-08 DIAGNOSIS — I2510 Atherosclerotic heart disease of native coronary artery without angina pectoris: Secondary | ICD-10-CM

## 2020-08-08 DIAGNOSIS — Z8616 Personal history of COVID-19: Secondary | ICD-10-CM

## 2020-08-08 DIAGNOSIS — R0602 Shortness of breath: Secondary | ICD-10-CM | POA: Diagnosis not present

## 2020-08-08 NOTE — Patient Instructions (Signed)
Medication Instructions:  Your physician recommends that you continue on your current medications as directed. Please refer to the Current Medication list given to you today.  *If you need a refill on your cardiac medications before your next appointment, please call your pharmacy*   Lab Work: Your physician recommends that you have lab work today(BMET)  If you have labs (blood work) drawn today and your tests are completely normal, you will receive your results only by: [name] MyChart Message (if you have MyChart) OR . A paper copy in the mail If you have any lab test that is abnormal or we need to change your treatment, we will call you to review the results.   Testing/Procedures: 1- A zio monitor was placed today. It will remain on for 14 days. You will then return monitor and event diary in provided box. It takes 1-2 weeks for report to be downloaded and returned to [place]. We will call you with the results. If monitor falls of or has orange flashing light, please call Zio for further instructions.    Follow-Up: At [facility], you and your health needs are our priority.  As part of our continuing mission to provide you with exceptional heart care, we have created designated Provider Care Teams.  These Care Teams include your primary Cardiologist (physician) and Advanced Practice Providers (APPs -  Physician Assistants and Nurse Practitioners) who all work together to provide you with the care you need, when you need it.  We recommend signing up for the patient portal called "MyChart".  Sign up information is provided on this After Visit Summary.  MyChart is used to connect with patients for Virtual Visits (Telemedicine).  Patients are able to view lab/test results, encounter notes, upcoming appointments, etc.  Non-urgent messages can be sent to your provider as well.   To learn more about what you can do with MyChart, go to [email or web address].    Your next appointment:   2  month(s)  The format for your next appointment:   In Person  Provider:    You may see Nelva Bush, MD or [name], PA-C

## 2020-08-09 LAB — BASIC METABOLIC PANEL
BUN/Creatinine Ratio: 22 (ref 10–24)
BUN: 24 mg/dL (ref 8–27)
CO2: 23 mmol/L (ref 20–29)
Calcium: 9.5 mg/dL (ref 8.6–10.2)
Chloride: 107 mmol/L — ABNORMAL HIGH (ref 96–106)
Creatinine, Ser: 1.1 mg/dL (ref 0.76–1.27)
GFR calc Af Amer: 82 mL/min/{1.73_m2} (ref >59)
GFR calc non Af Amer: 71 mL/min/{1.73_m2} (ref >59)
Glucose: 80 mg/dL (ref 65–99)
Potassium: 4.4 mmol/L (ref 3.5–5.2)
Sodium: 142 mmol/L (ref 134–144)

## 2020-08-10 ENCOUNTER — Ambulatory Visit (INDEPENDENT_AMBULATORY_CARE_PROVIDER_SITE_OTHER): Payer: 59

## 2020-08-10 DIAGNOSIS — R002 Palpitations: Secondary | ICD-10-CM | POA: Diagnosis not present

## 2020-08-14 ENCOUNTER — Telehealth: Payer: Self-pay

## 2020-08-14 NOTE — Telephone Encounter (Signed)
Patient Name: Brian Atkins Gender: Male DOB: 1956/08/24 Age: 64 Y 11 M 17 D Return Phone Number: [phone] (Primary), [phone] (Secondary) Address[place] [address] Client [facility] Phys[facility] Deborra Medina - MD Contact Type Call Who Is Calling Patient / Member / Family / Caregiver Call Type Triage / Clinical Relationship To Patient Self Return Phone Number [phone] (Secondary) Chief Complaint Cough Reason for Call Symptomatic / Request for [place] states, may have pneumonia, cough very productive, fever 100.1 at times. Tylenol helps. cough stated in lungs now up to head. Took cough drops last night, due to a non-stopping cough. Got better Translation No Nurse Assessment Nurse: Ysidro Evert, RN, Levada Dy Date/Time (Eastern Time): 08/14/2020 7:42:07 AM Confirm and document reason for call. If symptomatic, describe symptoms. ---Caller states he has had a cough since Sunday. He has congestion. He has a low grade temp about 100. Has the patient had close contact with a person known or suspected to have the novel coronavirus illness OR traveled / lives in area with major community spread (including international travel) in the last 14 days from the onset of symptoms? * If Asymptomatic, screen for exposure and travel within the last 14 days. ---No Does the patient have any new or worsening symptoms? ---Yes Will a triage be completed? ---Yes Related visit to physician within the last 2 weeks? ---No Does the PT have any chronic conditions? (i.e. diabetes, asthma, this includes High risk factors for pregnancy, etc.) ---No Is this a behavioral health or substance abuse call? ---No Guidelines Guideline Title Affirmed Question Affirmed Notes Nurse Date/Time Eilene Ghazi Time) Common Cold Common cold with no complications Earleen Reaper 08/14/2020 7:43:16 AM Disp. Time Eilene Ghazi Time) Disposition Final User 08/14/2020 7:52:58 AM Home Care Yes Ysidro Evert, RN, AngelaPLEASE NOTE: All timestamps contained within this report are represented as [place] Standard Time. CONFIDENTIALTY NOTICE: This fax transmission is intended only for the addressee. It contains information that is legally privileged, confidential or otherwise protected from use or disclosure. If you are not the intended recipient, you are strictly prohibited from reviewing, disclosing, copying using or disseminating any of this information or taking any action in reliance on or regarding this information. If you have received this fax in error, please notify us immediately by telephone so that we can arrange for its return to [place]. Phone: [phone], Toll-Free: [phone], Fax: [phone] Page: 2 of 2 Call Id: [id number] [place] Disagree/Comply Comply Caller Understands Yes PreDisposition Did not know what to do Care Advice Given Per Guideline HOME CARE: * You should be able to treat this at home. REASSURANCE AND EDUCATION - COMMON COLD SYMPTOMS: * It sounds like an uncomplicated cold that we can treat at home. * Colds are very common and may make you feel uncomfortable. * Colds are caused by viruses, and no medicine or 'shot' will cure an uncomplicated cold. FOR A RUNNY NOSE - BLOW YOUR NOSE: * Nasal mucus and discharge help wash viruses and bacteria out of the nose and sinuses. * Blowing your nose helps clean out your nose. Use a handkerchief or a paper tissue. NASAL WASHES FOR A STUFFY NOSE: * Introduction: Saline (salt water) nasal irrigation (nasal wash) is an effective and simple home remedy for treating stuffy nose and sinus congestion. The nose can be irrigated by pouring, spraying, or squirting salt water into the nose and then letting it run back out. NASAL WASHES -  STEP-BY-STEP INSTRUCTIONS: * STEP 1: Lean over a sink. * STEP 2: Gently squirt or spray warm  salt water into one of your nostrils. * STEP 3: Some of the water may run into the back of your throat. Spit this out. If you swallow the salt water it will not hurt you. * Cough: Use cough drops. * Hydrate: Drink adequate liquids. TREATMENT FOR OTHER COLD SYMPTOMS: HUMIDIFIER: * If the air in your home is dry, use a humidifier. EXPECTED COURSE: * Fever 2 to 3 days * Nasal discharge 7-14 days * Cough 2-3 weeks. CALL BACK IF: * Fever lasts over 3 days * You become short of breath * You become worse. CARE ADVICE given per Common Cold (Adult) guideline.    Called pt to follow up on how he is feeling this afternoon and to see if he has seen anybody for his symptoms. Pt stated that after he spoke to access nurse he called Dr. Kathyrn Sheriff and was prescribed Azithromycin and told to drink plenty of fluids. Pt is also taking codeine cough syrup, cough drops and drinking hot tea. Pt did a home covid-19 test and it was negative. Pt was advised that if not any better once completing medication or gets worse to give our office or Dr. Maisie Fus office a call back. Pt gave a verbal understanding.

## 2020-08-22 ENCOUNTER — Other Ambulatory Visit: Payer: Self-pay | Admitting: Physician Assistant

## 2020-08-26 ENCOUNTER — Other Ambulatory Visit: Payer: Self-pay | Admitting: Internal Medicine

## 2020-08-27 NOTE — Telephone Encounter (Signed)
Refill request for Brian Atkins, last seen 01-21-20, last filled 02-29-20.  Please advise.

## 2020-09-02 ENCOUNTER — Other Ambulatory Visit: Payer: Self-pay | Admitting: Internal Medicine

## 2020-09-04 ENCOUNTER — Other Ambulatory Visit: Payer: Self-pay | Admitting: Physician Assistant

## 2020-09-05 ENCOUNTER — Telehealth: Payer: Self-pay | Admitting: Physician Assistant

## 2020-09-05 NOTE — Telephone Encounter (Signed)
Rise Mu, PA-C  09/04/2020 4:09 PM EDT Back to Top    Cardiac monitor showed a predominant rhythm of sinus with an average rate of 64 bpm. Rare extra beats from the top and bottom portions of the heart. No sustained arrhythmias or prolonged pauses. Patient triggered events corresponded to sinus rhythm, PACs, and PVCs. Overall this is a reassuring heart monitor. He is already on carvedilol which should be continued. Follow-up as planned next month.

## 2020-09-05 NOTE — Telephone Encounter (Signed)
Patient returning call.  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

## 2020-09-05 NOTE — Telephone Encounter (Signed)
Attempted to call the patient on his home #. No answer &  No voice mail after multiple rings.  Attempted to call the patient on her cell #. No answer- I left a message to please call back.

## 2020-09-08 NOTE — Telephone Encounter (Signed)
Patients wife calling in stating he wants her to be called at [phone]

## 2020-09-08 NOTE — Telephone Encounter (Signed)
Patients wife made aware of cardiac monitor results with verbalized understanding.

## 2020-09-12 ENCOUNTER — Other Ambulatory Visit
Admission: RE | Admit: 2020-09-12 | Discharge: 2020-09-12 | Disposition: A | Discharge status: Home / Self Care | Payer: 59 | Source: Ambulatory Visit | Attending: Nurse Practitioner | Admitting: Nurse Practitioner

## 2020-09-12 ENCOUNTER — Other Ambulatory Visit: Payer: Self-pay

## 2020-09-12 ENCOUNTER — Telehealth: Payer: Self-pay | Admitting: Internal Medicine

## 2020-09-12 ENCOUNTER — Encounter: Payer: Self-pay | Admitting: Nurse Practitioner

## 2020-09-12 ENCOUNTER — Ambulatory Visit: Payer: 59 | Admitting: Nurse Practitioner

## 2020-09-12 VITALS — BP 110/78 | HR 72 | Ht 74.0 in | Wt 209.0 lb

## 2020-09-12 DIAGNOSIS — I10 Essential (primary) hypertension: Secondary | ICD-10-CM | POA: Diagnosis not present

## 2020-09-12 DIAGNOSIS — I2510 Atherosclerotic heart disease of native coronary artery without angina pectoris: Secondary | ICD-10-CM | POA: Insufficient documentation

## 2020-09-12 DIAGNOSIS — E785 Hyperlipidemia, unspecified: Secondary | ICD-10-CM | POA: Insufficient documentation

## 2020-09-12 DIAGNOSIS — E782 Mixed hyperlipidemia: Secondary | ICD-10-CM | POA: Diagnosis not present

## 2020-09-12 DIAGNOSIS — R600 Localized edema: Secondary | ICD-10-CM | POA: Diagnosis not present

## 2020-09-12 DIAGNOSIS — R002 Palpitations: Secondary | ICD-10-CM

## 2020-09-12 DIAGNOSIS — M7989 Other specified soft tissue disorders: Secondary | ICD-10-CM | POA: Diagnosis present

## 2020-09-12 LAB — BASIC METABOLIC PANEL
Anion gap: 12 (ref 5–15)
BUN: 24 mg/dL — ABNORMAL HIGH (ref 8–23)
CO2: 27 mmol/L (ref 22–32)
Calcium: 9.9 mg/dL (ref 8.9–10.3)
Chloride: 101 mmol/L (ref 98–111)
Creatinine, Ser: 1.13 mg/dL (ref 0.61–1.24)
GFR calc Af Amer: >60 mL/min (ref >60)
GFR calc non Af Amer: >60 mL/min (ref >60)
Glucose, Bld: 94 mg/dL (ref 70–99)
Potassium: 4.4 mmol/L (ref 3.5–5.1)
Sodium: 140 mmol/L (ref 135–145)

## 2020-09-12 NOTE — Telephone Encounter (Signed)
Spoke briefly with patient and he confirmed the previous encounter details.  Offered appointment this afternoon with Sharolyn Douglas, NP and patient is agreeable.  Scheduled.

## 2020-09-12 NOTE — Patient Instructions (Signed)
Medication Instructions:  Your physician has recommended you make the following change in your medication:  STOP AMLODIPINE  *If you need a refill on your cardiac medications before your next appointment, please call your pharmacy*   Lab Work: BMP at [facility] today  If you have labs (blood work) drawn today and your tests are completely normal, you will receive your results only by: [name] MyChart Message (if you have MyChart) OR . A paper copy in the mail If you have any lab test that is abnormal or we need to change your treatment, we will call you to review the results.   Testing/Procedures: None ordered   Follow-Up: At [facility], you and your health needs are our priority.  As part of our continuing mission to provide you with exceptional heart care, we have created designated Provider Care Teams.  These Care Teams include your primary Cardiologist (physician) and Advanced Practice Providers (APPs -  Physician Assistants and Nurse Practitioners) who all work together to provide you with the care you need, when you need it.  We recommend signing up for the patient portal called "MyChart".  Sign up information is provided on this After Visit Summary.  MyChart is used to connect with patients for Virtual Visits (Telemedicine).  Patients are able to view lab/test results, encounter notes, upcoming appointments, etc.  Non-urgent messages can be sent to your provider as well.   To learn more about what you can do with MyChart, go to [email or web address].    Your next appointment:   3 month(s)  The format for your next appointment:   In Person  Provider:   You may see Nelva Bush, MD or one of the following Advanced Practice Providers on your designated Care Team:    Murray Hodgkins, NP  [name], PA-C  Marrianne Mood, PA-C

## 2020-09-12 NOTE — Progress Notes (Signed)
Office Visit    Patient Name: Brian Atkins Date of Encounter: 09/12/2020  Primary Care Provider:  [name], MD Primary Cardiologist:  Nelva Bush, MD  Chief Complaint    64 year old male with a history of nonobstructive CAD by catheterization in December 2018 and coronary CTA in December 2020, marginal zone B-cell lymphoma, hypertension, palpitations, COVID-19 in January 2021, and GERD status post Nissen fundoplication, presents for follow-up related to lower extremity swelling.  Past Medical History    Past Medical History:  Diagnosis Date  . Complication of anesthesia    slow to wake  . Coronary artery disease, non-occlusive    a. LHC 12/18: ostLAD 20%, p/mLAD 60% FFR 0.84, ost ramus 50%, mRCA 50% FFR 0.94, EF 65%  . Diverticulitis   . Diverticulosis   . Dyslipidemia   . Family history of adverse reaction to anesthesia    Mother - slow to wake  . GERD (gastroesophageal reflux disease)   . Hemorrhoids   . Hiatal hernia   . History of 2019 novel coronavirus disease (COVID-19) 12/24/2019  . History of echocardiogram    a. TTE 1/19: EF of 60-65%, normal wall motion, normal LV diastolic function, mildly dilated LA  . Labile hypertension   . Lymphoma ([place])   . Medial epicondylitis of right elbow   . Meralgia paresthetica of right side   . Microscopic hematuria   . Ocular migraine   . Sleep apnea    CPAP   Past Surgical History:  Procedure Laterality Date  . INTRAVASCULAR PRESSURE WIRE/FFR STUDY N/A 12/07/2017   Procedure: INTRAVASCULAR PRESSURE WIRE/FFR STUDY;  Surgeon: Nelva Bush, MD;  Location: [facility];  Service: Cardiovascular;  Laterality: N/A;  . LEFT HEART CATH AND CORONARY ANGIOGRAPHY Left 12/07/2017   Procedure: LEFT HEART CATH AND CORONARY ANGIOGRAPHY;  Surgeon: Nelva Bush, MD;  Location: [facility];  Service: Cardiovascular;  Laterality: Left;  . nissen funduplication  [id number]   [name]  . SEPTOPLASTY Bilateral  08/23/2019   Procedure: SEPTOPLASTY;  Surgeon: [name], MD;  Location: [facility];  Service: ENT;  Laterality: Bilateral;  . SPLENECTOMY    . TURBINATE REDUCTION Bilateral 08/23/2019   Procedure: TURBINATE REDUCTION;  Surgeon: [name], MD;  Location: [facility];  Service: ENT;  Laterality: Bilateral;  Latex sensitivity sleep apnea    Allergies  Allergies  Allergen Reactions  . Avelox [Moxifloxacin] Hives and Swelling    Severe facial swelling  . Contrast Media [Iodinated Diagnostic Agents] Swelling    (OK with pre-medication)  . Crestor [Rosuvastatin Calcium]     myalgia  . Morphine And Related Itching  . Niacin-Simvastatin Er     myalgia  . Latex Rash    Gloves cause raxh  . Tape Dermatitis    History of Present Illness    64 year old male with a history of nonobstructive CAD by catheterization in December 2018 and coronary CTA in December 2020, marginal zone B-cell lymphoma, hypertension, palpitations, COVID-19 in January 2021, and GERD status post Nissen fundoplication.  He was first evaluated for chest pain and palpitations December 2018 with left heart catheterization at that time revealing moderate nonobstructive CAD in the proximal/mid LAD and mid RCA.  FFR in both areas was normal at 0.84 and 0.94 respectively.  And recurrent chest pain in December 2020 and coronary CT was undertaken and showed a calcium score 496, placing him in the 86 percentile for age/sex.  He had mild, diffuse nonobstructive CAD  and calcified plaque in the left main, LAD, circumflex, and RCA with a maximum stenosis of 25-49%.  He has been medically managed since.  January 2021, he was diagnosed with COVID-19 though did not require hospitalization.  Since then, he has had some degree of chronic, stable exertional dyspnea.  His most recent echo in February 2021 showed normal LV function with grade 1 diastolic dysfunction.  Earlier this year, he also reported lower extremity swelling  was placed on Lasix 40 mg daily.  His amlodipine dose was also reduced and he did notice improvement in swelling.  ABIs were undertaken in the setting of leg pain, and those were normal.  At his most recent visit in late August, he reported palpitations.  Zio monitoring was performed and showed rare PACs and PVCs.  Triggered events did correspond to PACs, PVCs, and sinus rhythm.  Since his last visit, he has done reasonably well.  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  He continues to have intermittent palpitations that was reassured by findings on monitoring.  His primary complaint today is an increase in lower extremity swelling.  He had been taking Lasix as needed only but now is taking it 40 mg daily over the past 2 weeks due to an increase in what he perceives as fairly significant lower extremity edema that is worse throughout the day and better in the morning.  He tries to avoid salt and does pack his own lunch.  He sits for much of his day.  He has not had any chest pain, dyspnea, PND, orthopnea, dizziness, syncope, or early satiety.  Home Medications    Prior to Admission medications   Medication Sig Start Date End Date Taking? Authorizing Provider  amLODipine (NORVASC) 5 MG tablet Take 5 mg by mouth daily.   Yes [provider]  benzonatate (TESSALON) 200 MG capsule Take 1 capsule (200 mg total) by mouth 3 (three) times daily as needed for cough. 01/21/20  Yes [name], MD  carvedilol (COREG) 25 MG tablet TAKE 1 TABLET BY MOUTH TWICE A DAY 09/04/20  Yes Dunn, [name] M, PA-C  cyanocobalamin (,VITAMIN B-12,) 1000 MCG/ML injection INJECT 1 ML INTO THE MUSCLE DAILY FOR 3 DAYS, THEN WEEKLY FOR 4 WEEKS , THEN MONTHLY THEREAFTER 04/14/20  Yes [name], MD  dexlansoprazole (DEXILANT) 60 MG capsule Take 60 mg by mouth 2 (two) times daily.   Yes [provider]  eszopiclone (LUNESTA) 1 MG TABS tablet PLEASE SEE ATTACHED FOR DETAILED DIRECTIONS 08/20/20  Yes [provider]  ezetimibe (ZETIA) 10 MG tablet Take 1 tablet (10 mg total) by mouth daily. 12/03/19 03/06/21 Yes Dunn, Areta Haber, PA-C  furosemide (LASIX) 40 MG tablet Take 1 tablet (40 mg total) by mouth daily. 03/26/20 09/12/20 Yes End, Harrell Gave, MD  Galcanezumab-gnlm [facility]) 120 MG/ML SOAJ Inject 120 mg into the skin every 30 (thirty) days.   Yes [provider]  LORazepam (ATIVAN) 1 MG tablet Take 1 tablet (1 mg total) by mouth every 8 (eight) hours as needed. 09/15/15  Yes [name], MD  losartan (COZAAR) 100 MG tablet TAKE 1 TABLET BY MOUTH EVERY DAY 09/02/20  Yes [name], MD  neomycin (MYCIFRADIN) 500 MG tablet Take by mouth. 08/20/20  Yes [provider]  nitroGLYCERIN (NITROSTAT) 0.4 MG SL tablet Place 1 tablet (0.4 mg total) under the tongue every 5 (five) minutes as needed for chest pain. Maximum of 3 doses. 11/30/17 03/06/21 Yes End, Harrell Gave,  MD  ONETOUCH VERIO test strip USE TO CHECK BLOOD SUGAR AS NEEDED 06/18/19  Yes [name], MD  prochlorperazine (COMPAZINE) 5 MG tablet Take 5 mg by mouth every 6 (six) hours as needed for nausea or vomiting.   Yes [provider]  Prucalopride Succinate (MOTEGRITY) 2 MG TABS Take 1 tablet by mouth daily.   Yes [provider]  rOPINIRole (REQUIP) 0.25 MG tablet Take by mouth. 03/26/20  Yes [provider]  rosuvastatin (CRESTOR) 20 MG tablet Take 1 tablet (20 mg total) by mouth daily. 12/19/19  Yes End, Harrell Gave, MD  SYMBICORT 80-4.5 MCG/ACT inhaler Inhale 2 puffs into the lungs 2 (two) times daily. 03/06/20  Yes Spero Geralds, MD  Syringe/Needle, Disp, (SYRINGE 3CC/25GX1") 25G X 1" 3 ML MISC Use for b12 injections 03/15/18  Yes [name], MD  zolpidem (AMBIEN) 10 MG tablet TAKE 1 TABLET BY MOUTH AT BEDTIME AS NEEDED FOR SLEEP. 08/29/20  Yes [name], MD    Review of Systems    Continues to have intermittent palpitations though these are not particularly bothersome.  He  has been bothered by an increase in lower extremity swelling over the past 2 weeks.  He denies chest pain, any change in dyspnea, PND, orthopnea, dizziness, syncope, or early satiety.  All other systems reviewed and are otherwise negative except as noted above.  Physical Exam    VS:  BP 110/78 (BP Location: Left Arm, Patient Position: Sitting, Cuff Size: Normal)   Pulse 72   Ht 6\' 2"  (1.88 m)   Wt 209 lb (94.8 kg)   SpO2 97%   BMI 26.83 kg/m  , BMI Body mass index is 26.83 kg/m. GEN: Well nourished, well developed, in no acute distress. HEENT: normal. Neck: Supple, no JVD, carotid bruits, or masses. Cardiac: RRR, no murmurs, rubs, or gallops. No clubbing, cyanosis.  Trace bilateral edema just above his sock line.  Radials/PT 2+ and equal bilaterally.  Respiratory:  Respirations regular and unlabored, clear to auscultation bilaterally. GI: Soft, nontender, nondistended, BS + x 4. MS: no deformity or atrophy. Skin: warm and dry, no rash. Neuro:  Strength and sensation are intact. Psych: Normal affect.  Accessory Clinical Findings     Lab Results  Component Value Date   WBC 8.2 04/15/2020   HGB 16.1 04/15/2020   HCT 47.1 04/15/2020   MCV 89.7 04/15/2020   PLT 324 04/15/2020   Lab Results  Component Value Date   CREATININE 1.13 09/12/2020   BUN 24 (H) 09/12/2020   NA 140 09/12/2020   K 4.4 09/12/2020   CL 101 09/12/2020   CO2 27 09/12/2020   Lab Results  Component Value Date   ALT 26 04/15/2020   AST 24 04/15/2020   ALKPHOS 76 04/15/2020   BILITOT 0.7 04/15/2020   Lab Results  Component Value Date   CHOL 149 03/10/2020   HDL 30.50 (L) 03/10/2020   LDLCALC 127 (H) 11/28/2019   LDLDIRECT 98.0 03/10/2020   TRIG 233.0 (H) 03/10/2020   CHOLHDL 5 03/10/2020    Lab Results  Component Value Date   HGBA1C 5.8 03/10/2020    Assessment & Plan    1.  Lower extremity swelling: Patient ports a 2-week history of increasing lower extremity swelling which has been  bothersome.  He generally notes a ring around his ankle just above his sock line and has been taking Lasix 40 mg daily.  On exam, he has only trace lower extremity edema without  any significant edema underneath his socks.  I did obtain a basic metabolic panel today and BUN is minimally elevated at 24 with a creatinine at the higher end of normal at 1.13.  Given normal blood pressure of 110/78 today, I have asked him to discontinue amlodipine.  His medication list says that he is taking 5 mg but he notes that he is only taking 2.5.  I have also asked him to purchase compression socks as I suspect his swelling is largely venous insufficiency.  Hopefully between discontinuation of amlodipine and using compression socks, his Lasix need will drop and he can go back to taking it as needed.  He was agreeable with this plan.  2.  Essential hypertension: Blood pressure normal today at 110/78.  He remains on carvedilol and losartan.  Discontinue amlodipine in setting of ongoing lower extremity swelling.  He will follow blood pressure at home and contact us if pressures begin trending up off of amlodipine.  3.  Nonobstructive CAD: No recent chest pain.  He remains on beta-blocker and statin therapy.  4.  Palpitations: PACs and PVCs noted on recent monitoring.  Reassurance offered.  Continue beta-blocker therapy.  5.  Hyperlipidemia: Direct LDL of 98 in March.  Continue statin and Zetia therapy.  6.  Disposition: Follow-up in clinic in 3 months or sooner if necessary.  Murray Hodgkins, NP 09/12/2020, 5:53 PM

## 2020-09-12 NOTE — Telephone Encounter (Signed)
Pt c/o swelling: STAT is pt has developed SOB within 24 hours  1) How much weight have you gained and in what time span? n/a  2) If swelling, where is the swelling located? Leg swelling  3) Are you currently taking a fluid pill? 40 mg lasix  4) Are you currently SOB? no  5) Do you have a log of your daily weights (if so, list)? n/a  6) Have you gained 3 pounds in a day or 5 pounds in a week? n/a  7) Have you traveled recently? N/a  Felt like this for a week, still taking lasix. Legs are red and swollen, patient not able to wear his socks comfortably

## 2020-09-15 ENCOUNTER — Telehealth: Payer: Self-pay | Admitting: Nurse Practitioner

## 2020-09-15 NOTE — Telephone Encounter (Signed)
Attempted to call the patient on his cell #- no answer/ I left a message to please call back.  Attempted to call his home #- no answer/ no voice mail after multiple rings, unable to leave a message.

## 2020-09-15 NOTE — Telephone Encounter (Signed)
Theora Gianotti, NP  09/12/2020 5:16 PM EDT     Kidney's likely a little dry on lasix 40 daily. BUN minimally elevated @ 24. Creat on the high side of nl @ 1.13. Potassium wnl. As we discussed, hopefully mild swelling improves off of amlodipine and w/ use of compression socks, at which point he can reduce lasix dose back to as needed.

## 2020-09-16 MED ORDER — FUROSEMIDE 40 MG PO TABS
40.0000 mg | ORAL_TABLET | Freq: Every day | ORAL | Status: DC | PRN
Start: 2020-09-16 — End: 2021-08-14

## 2020-09-16 NOTE — Telephone Encounter (Signed)
Patient made aware of lab results and Ignacia Bayley, NP recommendation with verbalized understanding.

## 2020-09-23 ENCOUNTER — Ambulatory Visit (INDEPENDENT_AMBULATORY_CARE_PROVIDER_SITE_OTHER): Payer: 59 | Admitting: Internal Medicine

## 2020-09-23 ENCOUNTER — Ambulatory Visit (INDEPENDENT_AMBULATORY_CARE_PROVIDER_SITE_OTHER): Payer: 59

## 2020-09-23 ENCOUNTER — Other Ambulatory Visit: Payer: Self-pay

## 2020-09-23 ENCOUNTER — Encounter: Payer: Self-pay | Admitting: Internal Medicine

## 2020-09-23 VITALS — BP 130/92 | HR 69 | Temp 98.6°F | Ht 74.02 in | Wt 211.8 lb

## 2020-09-23 DIAGNOSIS — R6882 Decreased libido: Secondary | ICD-10-CM

## 2020-09-23 DIAGNOSIS — R059 Cough, unspecified: Secondary | ICD-10-CM

## 2020-09-23 DIAGNOSIS — R7303 Prediabetes: Secondary | ICD-10-CM

## 2020-09-23 DIAGNOSIS — R5383 Other fatigue: Secondary | ICD-10-CM

## 2020-09-23 MED ORDER — ESZOPICLONE 2 MG PO TABS: ORAL_TABLET | ORAL | 5 refills | Status: DC

## 2020-09-23 NOTE — Patient Instructions (Signed)
Check your oxygen level after a 6 minute walk and let me know the results   Ok to alternate between [place]  Try taking melatonin at dinner time  NOT BEDTIME

## 2020-09-23 NOTE — Assessment & Plan Note (Signed)
Persistent, since COVID in December .  He is also short of breath walking.  chest x ray ordered Asked him to check his pulse oximetry after a 6 minute walk.

## 2020-09-23 NOTE — Assessment & Plan Note (Signed)
His  random glucose is not  elevated but her A1c suggests she is at risk for developing diabetes.  I recommend he follow a low glycemic index diet and increase his protein intake .  We should check an A1c in 6 months.  Lab Results  Component Value Date   HGBA1C 5.8 03/10/2020

## 2020-09-23 NOTE — Progress Notes (Signed)
Subjective:  Patient ID: Brian Atkins, male    DOB: 08-Jan-1956  Age: 64 y.o. MRN: [id number]  CC: The primary encounter diagnosis was Cough. Diagnoses of Decreased libido, Fatigue, unspecified type, and Prediabetes were also pertinent to this visit.  HPI Brian Atkins presents for follow up on multiple problems.  This visit occurred during the SARS-CoV-2 public health emergency.  Safety protocols were in place, including screening questions prior to the visit, additional usage of staff PPE, and extensive cleaning of exam room while observing appropriate contact time as indicated for disinfecting solutions.   1) fatigue, low energy. No libido.  Symptoms present since his COVID infection in December .  Has a persistent dry hacking cough brought on by strenuous activity  2) dizziness, intermittent for the last 2-3 months   3) stopped amlodipine by Dr.  Saunders Revel due to edema.  diastolics have been elevated since then   4)  Worried about his memory, has been forgetting things.  He is stressed  out all the time,  15 projects at once at work,  [employer] are not performing well and he recently sold his vacation home  So he has no recreational outlet anymore   5) chronic constipation,  intermittently ok.  Uses phillips Bid and motegrity. linzess failure (too strong)   6) chronic insomnia:  He is alternating between [place] .     Outpatient Medications Prior to Visit  Medication Sig Dispense Refill  . benzonatate (TESSALON) 200 MG capsule Take 1 capsule (200 mg total) by mouth 3 (three) times daily as needed for cough. 60 capsule 1  . carvedilol (COREG) 25 MG tablet TAKE 1 TABLET BY MOUTH TWICE A DAY 180 tablet 3  . cyanocobalamin (,VITAMIN B-12,) 1000 MCG/ML injection INJECT 1 ML INTO THE MUSCLE DAILY FOR 3 DAYS, THEN WEEKLY FOR 4 WEEKS , THEN MONTHLY THEREAFTER 6 mL 5  . dexlansoprazole (DEXILANT) 60 MG capsule Take 60 mg by mouth 2 (two) times daily.    [name] ezetimibe (ZETIA) 10 MG tablet  Take 1 tablet (10 mg total) by mouth daily. 90 tablet 3  . furosemide (LASIX) 40 MG tablet Take 1 tablet (40 mg total) by mouth daily as needed.    . Galcanezumab-gnlm (EMGALITY) 120 MG/ML SOAJ Inject 120 mg into the skin every 30 (thirty) days.    [name] LORazepam (ATIVAN) 1 MG tablet Take 1 tablet (1 mg total) by mouth every 8 (eight) hours as needed. 30 tablet 3  . losartan (COZAAR) 100 MG tablet TAKE 1 TABLET BY MOUTH EVERY DAY 90 tablet 0  . neomycin (MYCIFRADIN) 500 MG tablet Take by mouth.    . nitroGLYCERIN (NITROSTAT) 0.4 MG SL tablet Place 1 tablet (0.4 mg total) under the tongue every 5 (five) minutes as needed for chest pain. Maximum of 3 doses. 35 tablet 3  . ONETOUCH VERIO test strip USE TO CHECK BLOOD SUGAR AS NEEDED 100 strip 2  . prochlorperazine (COMPAZINE) 5 MG tablet Take 5 mg by mouth every 6 (six) hours as needed for nausea or vomiting.    . Prucalopride Succinate (MOTEGRITY) 2 MG TABS Take 1 tablet by mouth daily.    [name] rOPINIRole (REQUIP) 0.25 MG tablet Take by mouth.    . rosuvastatin (CRESTOR) 20 MG tablet Take 1 tablet (20 mg total) by mouth daily. 30 tablet 11  . SYMBICORT 80-4.5 MCG/ACT inhaler Inhale 2 puffs into the lungs 2 (two) times daily. 1 Inhaler 5  . Syringe/Needle, Disp, (  SYRINGE 3CC/25GX1") 25G X 1" 3 ML MISC Use for b12 injections 50 each 0  . zolpidem (AMBIEN) 10 MG tablet TAKE 1 TABLET BY MOUTH AT BEDTIME AS NEEDED FOR SLEEP. 30 tablet 2  . eszopiclone (LUNESTA) 1 MG TABS tablet PLEASE SEE ATTACHED FOR DETAILED DIRECTIONS (Patient not taking: Reported on 09/23/2020)     No facility-administered medications prior to visit.    Review of Systems;  Patient denies headache, fevers, malaise, unintentional weight loss, skin rash, eye pain, sinus congestion and sinus pain, sore throat, dysphagia,  hemoptysis , cough, dyspnea, wheezing, chest pain, palpitations, orthopnea, edema, abdominal pain, nausea, melena, diarrhea, constipation, flank pain, dysuria, hematuria,  urinary  Frequency, nocturia, numbness, tingling, seizures,  Focal weakness, Loss of consciousness,  Tremor, insomnia, depression, anxiety, and suicidal ideation.      Objective:  BP (!) 130/92 (BP Location: Left Arm, Patient Position: Sitting)   Pulse 69   Temp 98.6 F (37 C)   Ht 6' 2.02" (1.88 m)   Wt 211 lb 12.8 oz (96.1 kg)   SpO2 96%   BMI 27.18 kg/m   BP Readings from Last 3 Encounters:  09/23/20 (!) 130/92  09/12/20 110/78  08/08/20 120/90    Wt Readings from Last 3 Encounters:  09/23/20 211 lb 12.8 oz (96.1 kg)  09/12/20 209 lb (94.8 kg)  08/08/20 208 lb 12.8 oz (94.7 kg)    General appearance: alert, cooperative and appears stated age Ears: normal TM's and external ear canals both ears Throat: lips, mucosa, and tongue normal; teeth and gums normal Neck: no adenopathy, no carotid bruit, supple, symmetrical, trachea midline and thyroid not enlarged, symmetric, no tenderness/mass/nodules Back: symmetric, no curvature. ROM normal. No CVA tenderness. Lungs: clear to auscultation bilaterally Heart: regular rate and rhythm, S1, S2 normal, no murmur, click, rub or gallop Abdomen: soft, non-tender; bowel sounds normal; no masses,  no organomegaly Pulses: 2+ and symmetric Skin: Skin color, texture, turgor normal. No rashes or lesions Lymph nodes: Cervical, supraclavicular, and axillary nodes normal.  Lab Results  Component Value Date   HGBA1C 5.8 03/10/2020   HGBA1C 5.9 03/13/2019   HGBA1C 5.8 11/21/2017    Lab Results  Component Value Date   CREATININE 1.13 09/12/2020   CREATININE 1.10 08/08/2020   CREATININE 0.96 04/15/2020    Lab Results  Component Value Date   WBC 8.2 04/15/2020   HGB 16.1 04/15/2020   HCT 47.1 04/15/2020   PLT 324 04/15/2020   GLUCOSE 94 09/12/2020   CHOL 149 03/10/2020   TRIG 233.0 (H) 03/10/2020   HDL 30.50 (L) 03/10/2020   LDLDIRECT 98.0 03/10/2020   LDLCALC 127 (H) 11/28/2019   ALT 26 04/15/2020   AST 24 04/15/2020   NA 140  09/12/2020   K 4.4 09/12/2020   CL 101 09/12/2020   CREATININE 1.13 09/12/2020   BUN 24 (H) 09/12/2020   CO2 27 09/12/2020   TSH 1.370 04/15/2020   PSA 1.92 03/02/2013   INR 1.0 11/30/2017   HGBA1C 5.8 03/10/2020    No results found.  Assessment & Plan:   Problem List Items Addressed This Visit      Unprioritized   Cough - Primary    Persistent, since COVID in December .  He is also short of breath walking.  chest x ray ordered Asked him to check his pulse oximetry after a 6 minute walk.       Relevant Orders   DG Chest 2 View   Fatigue    Checking  testosterone, thyroid  levels      Relevant Orders   Hemoglobin A1c   Comprehensive metabolic panel   TSH   Prediabetes    His  random glucose is not  elevated but her A1c suggests she is at risk for developing diabetes.  I recommend he follow a low glycemic index diet and increase his protein intake .  We should check an A1c in 6 months.  Lab Results  Component Value Date   HGBA1C 5.8 03/10/2020            Other Visit Diagnoses    Decreased libido       Relevant Orders   Testos,Total,Free and SHBG (Male)      I have changed Cristopher Estimable. Talsma's eszopiclone. I am also having him maintain his LORazepam, nitroGLYCERIN, SYRINGE 3CC/25GX1", OneTouch Verio, Dexilant, ezetimibe, Emgality, Motegrity, prochlorperazine, rosuvastatin, benzonatate, Symbicort, cyanocobalamin, rOPINIRole, zolpidem, losartan, carvedilol, neomycin, and furosemide.  Meds ordered this encounter  Medications  . eszopiclone (LUNESTA) 2 MG TABS tablet    Sig: One tablet at bedtime.  Alternate with ambien    Dispense:  30 tablet    Refill:  5    Medications Discontinued During This Encounter  Medication Reason  . eszopiclone (LUNESTA) 1 MG TABS tablet Completed Course    Follow-up: Return in about 4 weeks (around 10/21/2020).   [name], MD

## 2020-09-23 NOTE — Assessment & Plan Note (Signed)
Checking testosterone, thyroid  levels

## 2020-09-24 LAB — COMPREHENSIVE METABOLIC PANEL
ALT: 18 U/L (ref 0–53)
AST: 16 U/L (ref 0–37)
Albumin: 4.4 g/dL (ref 3.5–5.2)
Alkaline Phosphatase: 71 U/L (ref 39–117)
BUN: 23 mg/dL (ref 6–23)
CO2: 31 mEq/L (ref 19–32)
Calcium: 9.8 mg/dL (ref 8.4–10.5)
Chloride: 106 mEq/L (ref 96–112)
Creatinine, Ser: 1.13 mg/dL (ref 0.40–1.50)
GFR: 68.27 mL/min (ref >60.00)
Glucose, Bld: 78 mg/dL (ref 70–99)
Potassium: 4.5 mEq/L (ref 3.5–5.1)
Sodium: 142 mEq/L (ref 135–145)
Total Bilirubin: 0.3 mg/dL (ref 0.2–1.2)
Total Protein: 7.1 g/dL (ref 6.0–8.3)

## 2020-09-24 LAB — HEMOGLOBIN A1C: Hgb A1c MFr Bld: 6 % (ref 4.6–6.5)

## 2020-09-24 LAB — TSH: TSH: 2.18 u[IU]/mL (ref 0.35–4.50)

## 2020-09-24 MED ORDER — AMOXICILLIN-POT CLAVULANATE 875-125 MG PO TABS
1.0000 | ORAL_TABLET | Freq: Two times a day (BID) | ORAL | 0 refills | Status: DC
Start: 2020-09-24 — End: 2020-10-24

## 2020-09-24 NOTE — Progress Notes (Signed)
Chest x ray suggests possible early pneumonia and left lower lobe is either scarred or collapsed.  I recommend treating you with an antibiotic and repeating the chest  x ray in 6 weeks

## 2020-09-25 ENCOUNTER — Other Ambulatory Visit: Payer: Self-pay | Admitting: Internal Medicine

## 2020-09-25 ENCOUNTER — Telehealth: Payer: Self-pay | Admitting: Internal Medicine

## 2020-09-25 MED ORDER — LORAZEPAM 0.5 MG PO TABS
1.0000 mg | ORAL_TABLET | Freq: Every day | ORAL | 5 refills | Status: DC | PRN
Start: 2020-09-25 — End: 2020-09-25

## 2020-09-25 MED ORDER — LORAZEPAM 0.5 MG PO TABS
0.5000 mg | ORAL_TABLET | Freq: Every day | ORAL | 5 refills | Status: DC | PRN
Start: 2020-09-25 — End: 2021-01-30

## 2020-09-25 NOTE — Progress Notes (Signed)
Your thyroid, liver and kidney function are normal.  . Your testosterone is still pending.   Your  fasting glucose has never been  diagnostic of diabetes; but your A1c continues to suggest that  you are at risk for developing type 2 Diabetes.  Following a  low glycemic index diet and participating regularly in some form of exercise (walking) has been shown to  delay the progression to diabetes for a long time.     I would like to see you again  In 6 months.     Regards,   Dr. Derrel Nip

## 2020-09-25 NOTE — Telephone Encounter (Signed)
I do not recall discussing the lorazepam , only the [place].   but if he feels he needs to use It during the day ONLY ,  I have refilled it at the lower dose.  HE SHOULD NOT COMBINE WITH AMBIEN OR LUNESTA

## 2020-09-25 NOTE — Telephone Encounter (Signed)
Spoke with pt and he wanted to know how long after takig the lorazepam should he wait to take the [place] or the [place]. He stated that if he does have to take it he will take the lorazepam around 5 or 6pm and then take the ambien around 10 or 11pm.

## 2020-09-25 NOTE — Telephone Encounter (Signed)
That's fine. Lorazepam.  They should be separated by a minimum of 3 hours

## 2020-09-25 NOTE — Telephone Encounter (Signed)
Patient was seen by Dr. Derrel Nip  This week. They spoke about cutting patient's LORazepam (ATIVAN) 1 MG tablet in half. He was wondering if she still wanted to do that and has she called in a new prescibtion?

## 2020-09-26 LAB — TESTOS,TOTAL,FREE AND SHBG (FEMALE)
Free Testosterone: 52.8 pg/mL (ref 35.0–155.0)
Sex Hormone Binding: 38 nmol/L (ref 22–77)
Testosterone, Total, LC-MS-MS: 332 ng/dL (ref 250–1100)

## 2020-09-28 NOTE — Progress Notes (Signed)
  Your testosterone levels are normal. There is no indication for supplementation based on the values reported on your recent blood test.   Regards,   Deborra Medina, MD

## 2020-10-09 ENCOUNTER — Other Ambulatory Visit: Payer: Self-pay | Admitting: Physician Assistant

## 2020-10-15 ENCOUNTER — Ambulatory Visit: Payer: 59 | Admitting: Internal Medicine

## 2020-10-24 ENCOUNTER — Other Ambulatory Visit: Payer: Self-pay

## 2020-10-24 ENCOUNTER — Encounter: Payer: Self-pay | Admitting: Internal Medicine

## 2020-10-24 ENCOUNTER — Ambulatory Visit (INDEPENDENT_AMBULATORY_CARE_PROVIDER_SITE_OTHER): Payer: 59 | Admitting: Internal Medicine

## 2020-10-24 ENCOUNTER — Other Ambulatory Visit: Payer: 59

## 2020-10-24 ENCOUNTER — Ambulatory Visit (INDEPENDENT_AMBULATORY_CARE_PROVIDER_SITE_OTHER): Payer: 59

## 2020-10-24 VITALS — BP 130/90 | HR 62 | Temp 98.4°F | Resp 15 | Ht 74.0 in | Wt 216.6 lb

## 2020-10-24 DIAGNOSIS — Z23 Encounter for immunization: Secondary | ICD-10-CM

## 2020-10-24 DIAGNOSIS — G8929 Other chronic pain: Secondary | ICD-10-CM

## 2020-10-24 DIAGNOSIS — I10 Essential (primary) hypertension: Secondary | ICD-10-CM | POA: Diagnosis not present

## 2020-10-24 DIAGNOSIS — Z09 Encounter for follow-up examination after completed treatment for conditions other than malignant neoplasm: Secondary | ICD-10-CM

## 2020-10-24 DIAGNOSIS — M25511 Pain in right shoulder: Secondary | ICD-10-CM

## 2020-10-24 DIAGNOSIS — Z8701 Personal history of pneumonia (recurrent): Secondary | ICD-10-CM | POA: Diagnosis not present

## 2020-10-24 DIAGNOSIS — Z0000 Encounter for general adult medical examination without abnormal findings: Secondary | ICD-10-CM

## 2020-10-24 DIAGNOSIS — R059 Cough, unspecified: Secondary | ICD-10-CM

## 2020-10-24 DIAGNOSIS — R7303 Prediabetes: Secondary | ICD-10-CM

## 2020-10-24 DIAGNOSIS — Z125 Encounter for screening for malignant neoplasm of prostate: Secondary | ICD-10-CM

## 2020-10-24 DIAGNOSIS — E785 Hyperlipidemia, unspecified: Secondary | ICD-10-CM

## 2020-10-24 MED ORDER — HYDROCHLOROTHIAZIDE 12.5 MG PO CAPS: 12.5000 mg | ORAL_CAPSULE | Freq: Every day | ORAL | 2 refills | Status: DC

## 2020-10-24 NOTE — Assessment & Plan Note (Signed)
Not at goal.  Adding hctz 12.5 mg daily rtc 2 weeks for bmet

## 2020-10-24 NOTE — Assessment & Plan Note (Addendum)
Goal is LDL < 70  Given calcium score of 489 (86%).  Currently taking zetia 10 mg and rosuvastatin 20 mg . Will incrase rosuvastatin to 40 mg daily  Lab Results  Component Value Date   CHOL 236 (H) 10/24/2020   HDL 37 (L) 10/24/2020   LDLCALC 162 (H) 10/24/2020   LDLDIRECT 98.0 03/10/2020   TRIG 205 (H) 10/24/2020   CHOLHDL 6.4 (H) 10/24/2020

## 2020-10-24 NOTE — Assessment & Plan Note (Signed)
Chest x ray was suggestive of early PNA on Oct 5.  He was treated with augmentin with resolution of symptoms. Repeat cxr in 2 weeks

## 2020-10-24 NOTE — Patient Instructions (Signed)
If your LDL is > 70 today,  I will increase your Crestor (rosuvastatin) to 40 mg daily   I am adding 12.5 mg hctz for blood pressure   goal is 130/80.  We can double the dose if not at goal in 1 week    We'll check your potassium and sodium in 2 weeks when you return for chest x ray  Health Maintenance, Male Adopting a healthy lifestyle and getting preventive care are important in promoting health and wellness. Ask your health care provider about:  The right schedule for you to have regular tests and exams.  Things you can do on your own to prevent diseases and keep yourself healthy. What should I know about diet, weight, and exercise? Eat a healthy diet   Eat a diet that includes plenty of vegetables, fruits, low-fat dairy products, and lean protein.  Do not eat a lot of foods that are high in solid fats, added sugars, or sodium. Maintain a healthy weight Body mass index (BMI) is a measurement that can be used to identify possible weight problems. It estimates body fat based on height and weight. Your health care provider can help determine your BMI and help you achieve or maintain a healthy weight. Get regular exercise Get regular exercise. This is one of the most important things you can do for your health. Most adults should:  Exercise for at least 150 minutes each week. The exercise should increase your heart rate and make you sweat (moderate-intensity exercise).  Do strengthening exercises at least twice a week. This is in addition to the moderate-intensity exercise.  Spend less time sitting. Even light physical activity can be beneficial. Watch cholesterol and blood lipids Have your blood tested for lipids and cholesterol at 64 years of age, then have this test every 5 years. You may need to have your cholesterol levels checked more often if:  Your lipid or cholesterol levels are high.  You are older than 64 years of age.  You are at high risk for heart disease. What  should I know about cancer screening? Many types of cancers can be detected early and may often be prevented. Depending on your health history and family history, you may need to have cancer screening at various ages. This may include screening for:  Colorectal cancer.  Prostate cancer.  Skin cancer.  Lung cancer. What should I know about heart disease, diabetes, and high blood pressure? Blood pressure and heart disease  High blood pressure causes heart disease and increases the risk of stroke. This is more likely to develop in people who have high blood pressure readings, are of African descent, or are overweight.  Talk with your health care provider about your target blood pressure readings.  Have your blood pressure checked: ? Every 3-5 years if you are 55-92 years of age. ? Every year if you are 36 years old or older.  If you are between the ages of 4 and 24 and are a current or former smoker, ask your health care provider if you should have a one-time screening for abdominal aortic aneurysm (AAA). Diabetes Have regular diabetes screenings. This checks your fasting blood sugar level. Have the screening done:  Once every three years after age 104 if you are at a normal weight and have a low risk for diabetes.  More often and at a younger age if you are overweight or have a high risk for diabetes. What should I know about preventing infection? Hepatitis B If  you have a higher risk for hepatitis B, you should be screened for this virus. Talk with your health care provider to find out if you are at risk for hepatitis B infection. Hepatitis C Blood testing is recommended for:  Everyone born from [birth year] through 1965.  Anyone with known risk factors for hepatitis C. Sexually transmitted infections (STIs)  You should be screened each year for STIs, including gonorrhea and chlamydia, if: ? You are sexually active and are younger than 64 years of age. ? You are older than 64 years of  age and your health care provider tells you that you are at risk for this type of infection. ? Your sexual activity has changed since you were last screened, and you are at increased risk for chlamydia or gonorrhea. Ask your health care provider if you are at risk.  Ask your health care provider about whether you are at high risk for HIV. Your health care provider may recommend a prescription medicine to help prevent HIV infection. If you choose to take medicine to prevent HIV, you should first get tested for HIV. You should then be tested every 3 months for as long as you are taking the medicine. Follow these instructions at home: Lifestyle  Do not use any products that contain nicotine or tobacco, such as cigarettes, e-cigarettes, and chewing tobacco. If you need help quitting, ask your health care provider.  Do not use street drugs.  Do not share needles.  Ask your health care provider for help if you need support or information about quitting drugs. Alcohol use  Do not drink alcohol if your health care provider tells you not to drink.  If you drink alcohol: ? Limit how much you have to 0-2 drinks a day. ? Be aware of how much alcohol is in your drink. In the [place] one drink equals one 12 oz bottle of beer (355 mL), one 5 oz glass of wine (148 mL), or one 1 oz glass of hard liquor (44 mL). General instructions  Schedule regular health, dental, and eye exams.  Stay current with your vaccines.  Tell your health care provider if: ? You often feel depressed. ? You have ever been abused or do not feel safe at home. Summary  Adopting a healthy lifestyle and getting preventive care are important in promoting health and wellness.  Follow your health care provider's instructions about healthy diet, exercising, and getting tested or screened for diseases.  Follow your health care provider's instructions on monitoring your cholesterol and blood pressure. This information is not intended  to replace advice given to you by your health care provider. Make sure you discuss any questions you have with your health care provider. Document Revised: 11/29/2018 Document Reviewed: 11/29/2018 Elsevier Patient Education  2020 [employer].

## 2020-10-24 NOTE — Progress Notes (Signed)
Patient ID: Brian Atkins, male    DOB: 08-01-1956  Age: 64 y.o. MRN: [id number]  The patient is here for annual preventive examination and management of other chronic and acute problems.  This visit occurred during the SARS-CoV-2 public health emergency.  Safety protocols were in place, including screening questions prior to the visit, additional usage of staff PPE, and extensive cleaning of exam room while observing appropriate contact time as indicated for disinfecting solutions.    Patient has received both doses of the available COVID 19 vaccine without complications.  Patient continues to mask when outside of the home except when walking in yard or at safe distances from others .  Patient denies any change in mood or development of unhealthy behaviors resuting from the pandemic's restriction of activities and socialization.     The risk factors are reflected in the social history.  The roster of all physicians providing medical care to patient - is listed in the Snapshot section of the chart.  Activities of daily living:  The patient is 100% independent in all ADLs: dressing, toileting, feeding as well as independent mobility  Home safety : The patient has smoke detectors in the home. They wear seatbelts.  There are no firearms at home. There is no violence in the home.   There is no risks for hepatitis, STDs or HIV. There is no   history of blood transfusion. They have no travel history to infectious disease endemic areas of the world.  The patient has seen their dentist in the last six month. They have seen their eye doctor in the last year. They admit to slight hearing difficulty with regard to whispered voices and some television programs.  They have deferred audiologic testing in the last year.  They do not  have excessive sun exposure. Discussed the need for sun protection: hats, long sleeves and use of sunscreen if there is significant sun exposure.   Diet: the importance of a healthy  diet is discussed. They do have a healthy diet.  The benefits of regular aerobic exercise were discussed. She walks 4 times per week ,  20 minutes.   Depression screen: there are no signs or vegative symptoms of depression- irritability, change in appetite, anhedonia, sadness/tearfullness.  Cognitive assessment: the patient manages all their financial and personal affairs and is actively engaged. They could relate day,date,year and events; recalled 2/3 objects at 3 minutes; performed clock-face test normally.  The following portions of the patient's history were reviewed and updated as appropriate: allergies, current medications, past family history, past medical history,  past surgical history, past social history  and problem list.  Visual acuity was not assessed per patient preference since she has regular follow up with her ophthalmologist. Hearing and body mass index were assessed and reviewed.   During the course of the visit the patient was educated and counseled about appropriate screening and preventive services including : fall prevention , diabetes screening, nutrition counseling, colorectal cancer screening, and recommended immunizations.    CC: The primary encounter diagnosis was Right anterior shoulder pain. Diagnoses of Essential hypertension, Hospital discharge follow-up, Prediabetes, Hyperlipidemia LDL goal <70, Prostate cancer screening, COVID-19 vaccine regimen to maintain immunity completed, History of pneumonia, Cough, Chronic right shoulder pain, Need for immunization against influenza, and Routine general medical examination at a health care facility were also pertinent to this visit.  Hyperlipidemia  1) Cough resolved 80% and sob improved after a round of antibiotics and cxr suggestive of early PNA.  Has checked oxygen sats  Most recently on Oct 31 after a walk in the woods  and sats 96 %   2) Right arm /shoulder hurts if he sleeps on it. ,  Hurts with abduction  But no  other motions.. 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   3) HTN:  Elevated BP readings  On multiple occasions.  Taking carvedilol 25 mg bid , losartan 100 mg daily.  History of edema with amlodipine.    History Brian Atkins has a past medical history of Complication of anesthesia, Coronary artery disease, non-occlusive, COVID-19 virus infection (12/28/2019), Diverticulitis, Diverticulosis, Dyslipidemia, Family history of adverse reaction to anesthesia, GERD (gastroesophageal reflux disease), Hemorrhoids, Hiatal hernia, History of 2019 novel coronavirus disease (COVID-19) (12/24/2019), History of echocardiogram, Labile hypertension, Lymphoma ([place]), Medial epicondylitis of right elbow, Meralgia paresthetica of right side, Microscopic hematuria, Ocular migraine, and Sleep apnea.   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   His family history includes Aortic aneurysm (age of onset: 39) in his father; COPD in his mother; Cancer in his mother; Coronary artery disease (age of onset: 35) in his father; Hyperlipidemia in his father; Hypertension in his mother; Lung cancer in his mother; Stomach cancer in his maternal grandfather.He reports that he has never smoked. He has never used smokeless tobacco. He reports that he does not drink alcohol and does not use drugs.  Outpatient Medications Prior to Visit  Medication Sig Dispense Refill  . benzonatate (TESSALON) 200 MG capsule Take 1 capsule (200 mg total) by mouth 3 (three) times daily as needed for cough. 60 capsule 1  . carvedilol (COREG) 25 MG tablet TAKE 1 TABLET BY MOUTH TWICE A DAY 180 tablet 3  . cyanocobalamin (,VITAMIN B-12,) 1000 MCG/ML injection INJECT 1 ML INTO THE MUSCLE DAILY FOR 3 DAYS, THEN  WEEKLY FOR 4 WEEKS , THEN MONTHLY THEREAFTER 6 mL 5  . dexlansoprazole (DEXILANT) 60 MG capsule Take 60 mg by mouth 2 (two) times daily.    . eszopiclone (LUNESTA) 2 MG TABS tablet One tablet at bedtime.  Alternate with ambien 30 tablet 5  . ezetimibe (ZETIA) 10 MG tablet Take 1 tablet (10 mg total) by mouth daily. 90 tablet 3  . furosemide (LASIX) 40 MG tablet Take 1 tablet (40 mg total) by mouth daily as needed.    . Galcanezumab-gnlm (EMGALITY) 120 MG/ML SOAJ Inject 120 mg into the skin every 30 (thirty) days.    [name] LORazepam (ATIVAN) 0.5 MG tablet Take 1 tablet (0.5 mg total) by mouth daily as needed. 30 tablet 5  . losartan (COZAAR) 100 MG tablet TAKE 1 TABLET BY MOUTH EVERY DAY 90 tablet 0  . nitroGLYCERIN (NITROSTAT) 0.4 MG SL tablet Place 1 tablet (0.4 mg total) under the tongue every 5 (five) minutes as needed for chest pain. Maximum of 3 doses. 35 tablet 3  . ONETOUCH VERIO test strip USE TO CHECK BLOOD SUGAR AS NEEDED 100 strip 2  . prochlorperazine (COMPAZINE) 5 MG tablet Take 5 mg by mouth every 6 (six) hours as needed for nausea or vomiting.    . Prucalopride Succinate (MOTEGRITY) 2 MG TABS Take 1 tablet by mouth daily.    [name] rOPINIRole (REQUIP) 0.25 MG tablet Take by mouth.    . SYMBICORT 80-4.5 MCG/ACT inhaler Inhale 2 puffs into the lungs 2 (  two) times daily. 1 Inhaler 5  . Syringe/Needle, Disp, (SYRINGE 3CC/25GX1") 25G X 1" 3 ML MISC Use for b12 injections 50 each 0  . zolpidem (AMBIEN) 10 MG tablet TAKE 1 TABLET BY MOUTH AT BEDTIME AS NEEDED FOR SLEEP. 30 tablet 2  . rosuvastatin (CRESTOR) 20 MG tablet Take 1 tablet (20 mg total) by mouth daily. 30 tablet 11  . amoxicillin-clavulanate (AUGMENTIN) 875-125 MG tablet Take 1 tablet by mouth 2 (two) times daily. (Patient not taking: Reported on 10/24/2020) 14 tablet 0  . neomycin (MYCIFRADIN) 500 MG tablet Take by mouth. (Patient not taking: Reported on 10/24/2020)     No facility-administered medications prior to visit.    Review of  Systems   Patient denies headache, fevers, malaise, unintentional weight loss, skin rash, eye pain, sinus congestion and sinus pain, sore throat, dysphagia,  hemoptysis , cough, dyspnea, wheezing, chest pain, palpitations, orthopnea, edema, abdominal pain, nausea, melena, diarrhea, constipation, flank pain, dysuria, hematuria, urinary  Frequency, nocturia, numbness, tingling, seizures,  Focal weakness, Loss of consciousness,  Tremor, insomnia, depression, anxiety, and suicidal ideation.      Objective:  BP 130/90 (BP Location: Left Arm, Patient Position: Sitting, Cuff Size: Normal)   Pulse 62   Temp 98.4 F (36.9 C) (Oral)   Resp 15   Ht 6\' 2"  (1.88 m)   Wt 216 lb 9.6 oz (98.2 kg)   SpO2 97%   BMI 27.81 kg/m   Physical Exam  General appearance: alert, cooperative and appears stated age Ears: normal TM's and external ear canals both ears Throat: lips, mucosa, and tongue normal; teeth and gums normal Neck: no adenopathy, no carotid bruit, supple, symmetrical, trachea midline and thyroid not enlarged, symmetric, no tenderness/mass/nodules Back: symmetric, no curvature. ROM normal. No CVA tenderness. Lungs: clear to auscultation bilaterally Heart: regular rate and rhythm, S1, S2 normal, no murmur, click, rub or gallop Abdomen: soft, non-tender; bowel sounds normal; no masses,  no organomegaly Pulses: 2+ and symmetric Skin: Skin color, texture, turgor normal. No rashes or lesions Lymph nodes: Cervical, supraclavicular, and axillary nodes normal.  Assessment & Plan:   Problem List Items Addressed This Visit      Unprioritized   Chronic right shoulder pain    Anterior,  aggravatedby sleeping on it and abducting it.  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  Plain films ordered      Cough    Chest x ray was suggestive of early PNA on Oct 5.  He was treated with augmentin with resolution of symptoms. Repeat cxr in 2 weeks       Essential  hypertension    Not at goal.  Adding hctz 12.5 mg daily rtc 2 weeks for bmet       Relevant Medications   hydrochlorothiazide (MICROZIDE) 12.5 MG capsule   rosuvastatin (CRESTOR) 40 MG tablet   Other Relevant Orders   Microalbumin / creatinine urine ratio (Completed)   Basic metabolic panel   RESOLVED: Hospital discharge follow-up   Hyperlipidemia LDL goal <70    Goal is LDL < 70  Given calcium score of 489 (86%).  Currently taking zetia 10 mg and rosuvastatin 20 mg . Will incrase rosuvastatin to 40 mg daily  Lab Results  Component Value Date   CHOL 236 (H) 10/24/2020   HDL 37 (L) 10/24/2020   LDLCALC 162 (H) 10/24/2020   LDLDIRECT 98.0 03/10/2020   TRIG 205 (H) 10/24/2020   CHOLHDL 6.4 (  H) 10/24/2020         Relevant Medications   hydrochlorothiazide (MICROZIDE) 12.5 MG capsule   rosuvastatin (CRESTOR) 40 MG tablet   Other Relevant Orders   Lipid panel (Completed)   Prediabetes    His  random glucose was  not  elevated but his A1c suggests he is at risk for developing diabetes.  I recommended that he follow a low glycemic index diet and increase his protein intake .  We should check an A1c in 6 months.  Lab Results  Component Value Date   HGBA1C 6.0 09/23/2020           Relevant Orders   Comprehensive metabolic panel (Completed)   Microalbumin / creatinine urine ratio (Completed)   Routine general medical examination at a health care facility    age appropriate education and counseling updated, referrals for preventative services and immunizations addressed, dietary and smoking counseling addressed, most recent labs reviewed.  I have personally reviewed and have noted:  1) the patient's medical and social history 2) The pt's use of alcohol, tobacco, and illicit drugs 3) The patient's current medications and supplements 4) Functional ability including ADL's, fall risk, home safety risk, hearing and visual impairment 5) Diet and physical activities 6) Evidence for  depression or mood disorder 7) The patient's height, weight, and BMI have been recorded in the chart  I have made referrals, and provided counseling and education based on review of the above       Other Visit Diagnoses    Right anterior shoulder pain    -  Primary   Relevant Orders   DG Shoulder Right   Prostate cancer screening       Relevant Orders   PSA (Completed)   COVID-19 vaccine regimen to maintain immunity completed       Relevant Orders   SARS-CoV-2 Semi-Quantitative Total Antibody, Spike   History of pneumonia       Relevant Orders   DG Chest 2 View   Need for immunization against influenza       Relevant Orders   Flu Vaccine QUAD High Dose(Fluad) (Completed)      I have discontinued Cristopher Estimable. Riles's neomycin and amoxicillin-clavulanate. I have also changed his rosuvastatin. Additionally, I am having him start on hydrochlorothiazide. Lastly, I am having him maintain his nitroGLYCERIN, SYRINGE 3CC/25GX1", OneTouch Verio, Dexilant, ezetimibe, Emgality, Motegrity, prochlorperazine, benzonatate, Symbicort, cyanocobalamin, rOPINIRole, zolpidem, losartan, carvedilol, furosemide, eszopiclone, and LORazepam.  Meds ordered this encounter  Medications  . hydrochlorothiazide (MICROZIDE) 12.5 MG capsule    Sig: Take 1 capsule (12.5 mg total) by mouth daily.    Dispense:  30 capsule    Refill:  2  . rosuvastatin (CRESTOR) 40 MG tablet    Sig: Take 1 tablet (40 mg total) by mouth daily.    Dispense:  90 tablet    Refill:  1    Medications Discontinued During This Encounter  Medication Reason  . amoxicillin-clavulanate (AUGMENTIN) 875-125 MG tablet Completed Course  . neomycin (MYCIFRADIN) 500 MG tablet   . rosuvastatin (CRESTOR) 20 MG tablet     Follow-up: No follow-ups on file.   [name], MD

## 2020-10-24 NOTE — Assessment & Plan Note (Signed)
Anterior,  aggravatedby sleeping on it and abducting it.  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  Plain films ordered

## 2020-10-25 LAB — MICROALBUMIN / CREATININE URINE RATIO
Creatinine, Urine: 89 mg/dL (ref 20–320)
Microalb Creat Ratio: 6 mcg/mg creat (ref <30)
Microalb, Ur: 0.5 mg/dL

## 2020-10-25 LAB — LIPID PANEL
Cholesterol: 236 mg/dL — ABNORMAL HIGH (ref <200)
HDL: 37 mg/dL — ABNORMAL LOW (ref >=40)
LDL Cholesterol (Calc): 162 mg/dL (calc) — ABNORMAL HIGH
Non-HDL Cholesterol (Calc): 199 mg/dL (calc) — ABNORMAL HIGH (ref <130)
Total CHOL/HDL Ratio: 6.4 (calc) — ABNORMAL HIGH (ref <5.0)
Triglycerides: 205 mg/dL — ABNORMAL HIGH (ref <150)

## 2020-10-25 LAB — COMPREHENSIVE METABOLIC PANEL
AG Ratio: 1.5 (calc) (ref 1.0–2.5)
ALT: 17 U/L (ref 9–46)
AST: 17 U/L (ref 10–35)
Albumin: 4.4 g/dL (ref 3.6–5.1)
Alkaline phosphatase (APISO): 85 U/L (ref 35–144)
BUN: 25 mg/dL (ref 7–25)
CO2: 25 mmol/L (ref 20–32)
Calcium: 10 mg/dL (ref 8.6–10.3)
Chloride: 104 mmol/L (ref 98–110)
Creat: 1.18 mg/dL (ref 0.70–1.25)
Globulin: 2.9 g/dL (calc) (ref 1.9–3.7)
Glucose, Bld: 86 mg/dL (ref 65–99)
Potassium: 4.6 mmol/L (ref 3.5–5.3)
Sodium: 142 mmol/L (ref 135–146)
Total Bilirubin: 0.3 mg/dL (ref 0.2–1.2)
Total Protein: 7.3 g/dL (ref 6.1–8.1)

## 2020-10-25 LAB — PSA: PSA: 3.11 ng/mL (ref <=4.0)

## 2020-10-26 DIAGNOSIS — Z125 Encounter for screening for malignant neoplasm of prostate: Secondary | ICD-10-CM

## 2020-10-26 MED ORDER — ROSUVASTATIN CALCIUM 40 MG PO TABS: 40.0000 mg | ORAL_TABLET | Freq: Every day | ORAL | 1 refills | Status: DC

## 2020-10-26 NOTE — Progress Notes (Signed)
Your cholesterol was pretty high,  so I recommend increasing the Crestor to 40 mg daily and rechecking your lipid panel in  3 months.    Your PSA has increased slightly since the last time it was checked in (in 2014!), however, it is still in normal range.  Given that I do not know what the rate of change was (which matters with PSA's ), I recommend having an evaluation by urology to make sure that the increase is not due to a  nodule .   If you are in agreement ,let me know and I will make a referral .  Regards,   Deborra Medina, MD

## 2020-10-26 NOTE — Assessment & Plan Note (Addendum)
His  random glucose was  not  elevated but his A1c suggests he is at risk for developing diabetes.  I recommended that he follow a low glycemic index diet and increase his protein intake .  We should check an A1c in 6 months.  Lab Results  Component Value Date   HGBA1C 6.0 09/23/2020

## 2020-10-26 NOTE — Assessment & Plan Note (Signed)

## 2020-10-28 LAB — SARS-COV-2 SEMI-QUANTITATIVE TOTAL ANTIBODY, SPIKE: SARS COV2 AB, Total Spike Semi QN: >2500 U/mL — ABNORMAL HIGH (ref <0.8)

## 2020-10-29 NOTE — Progress Notes (Signed)
You have a very robust COVID antibody titre.  Regards,   Kennon Encinas, MD

## 2020-10-29 NOTE — Progress Notes (Signed)
Your shoulder films are normal: no spurring or degenerative changes.    Regards,   Deborra Medina, MD

## 2020-10-30 ENCOUNTER — Encounter: Payer: Self-pay | Admitting: Urology

## 2020-10-30 ENCOUNTER — Other Ambulatory Visit: Payer: Self-pay

## 2020-10-30 ENCOUNTER — Ambulatory Visit: Payer: 59 | Admitting: Urology

## 2020-10-30 VITALS — BP 131/82 | HR 70 | Ht 74.0 in | Wt 208.0 lb

## 2020-10-30 DIAGNOSIS — N5201 Erectile dysfunction due to arterial insufficiency: Secondary | ICD-10-CM

## 2020-10-30 DIAGNOSIS — N400 Enlarged prostate without lower urinary tract symptoms: Secondary | ICD-10-CM

## 2020-10-30 MED ORDER — TADALAFIL 20 MG PO TABS: ORAL_TABLET | ORAL | 0 refills | Status: DC

## 2020-10-30 NOTE — Progress Notes (Signed)
10/30/2020 10:26 AM   Brian Atkins 10/25/1956 [id number]  Referring provider: [name], MD [address] [place],  [place] [address]  Chief Complaint  Patient presents with  . Establish Care    HPI: 64 y.o. male referred for prostate cancer screening.   PSA 10/24/2020 3.11  Only prior PSA 02/2013 1.92  No bothersome LUTS, nocturia 0-2, occasional postvoid dribbling  Denies dysuria, gross hematuria  No flank, abdominal or pelvic pain  No history recurrent UTI  No family history prostate cancer  Does complain of difficulty achieving and maintaining an erection; no prior treatment  Organic risk factors coronary disease, hypertension, antihypertensive medications, dyslipidemia   PMH: Past Medical History:  Diagnosis Date  . Complication of anesthesia    slow to wake  . Coronary artery disease, non-occlusive    a. LHC 12/18: ostLAD 20%, p/mLAD 60% FFR 0.84, ost ramus 50%, mRCA 50% FFR 0.94, EF 65%  . COVID-19 virus infection 12/28/2019  . Diverticulitis   . Diverticulosis   . Dyslipidemia   . Family history of adverse reaction to anesthesia    Mother - slow to wake  . GERD (gastroesophageal reflux disease)   . Hemorrhoids   . Hiatal hernia   . History of 2019 novel coronavirus disease (COVID-19) 12/24/2019  . History of echocardiogram    a. TTE 1/19: EF of 60-65%, normal wall motion, normal LV diastolic function, mildly dilated LA  . Labile hypertension   . Lymphoma ([place])   . Medial epicondylitis of right elbow   . Meralgia paresthetica of right side   . Microscopic hematuria   . Ocular migraine   . Sleep apnea    CPAP    Surgical History: Past Surgical History:  Procedure Laterality Date  . INTRAVASCULAR PRESSURE WIRE/FFR STUDY N/A 12/07/2017   Procedure: INTRAVASCULAR PRESSURE WIRE/FFR STUDY;  Surgeon: Nelva Bush, MD;  Location: [facility];  Service: Cardiovascular;  Laterality: N/A;  . LEFT HEART CATH AND CORONARY  ANGIOGRAPHY Left 12/07/2017   Procedure: LEFT HEART CATH AND CORONARY ANGIOGRAPHY;  Surgeon: Nelva Bush, MD;  Location: [facility];  Service: Cardiovascular;  Laterality: Left;  . nissen funduplication  [id number]   Brian Atkins  . SEPTOPLASTY Bilateral 08/23/2019   Procedure: SEPTOPLASTY;  Surgeon: [name], MD;  Location: [facility];  Service: ENT;  Laterality: Bilateral;  . SPLENECTOMY    . TURBINATE REDUCTION Bilateral 08/23/2019   Procedure: TURBINATE REDUCTION;  Surgeon: [name], MD;  Location: [facility];  Service: ENT;  Laterality: Bilateral;  Latex sensitivity sleep apnea    Home Medications:  Allergies as of 10/30/2020      Reactions   Avelox [moxifloxacin] Hives, Swelling   Severe facial swelling   Contrast Media [iodinated Diagnostic Agents] Swelling   (OK with pre-medication)   Crestor [rosuvastatin Calcium]    myalgia   Morphine And Related Itching   Niacin-simvastatin Er    myalgia   Latex Rash   Gloves cause raxh   Tape Dermatitis      Medication List       Accurate as of October 30, 2020 10:26 AM. If you have any questions, ask your nurse or doctor.        STOP taking these medications   eszopiclone 2 MG Tabs tablet Commonly known as: LUNESTA Stopped by: [name], MD     TAKE these medications   benzonatate 200 MG capsule Commonly known as: TESSALON Take 1 capsule (  200 mg total) by mouth 3 (three) times daily as needed for cough. What changed: when to take this   carvedilol 25 MG tablet Commonly known as: COREG TAKE 1 TABLET BY MOUTH TWICE A DAY   cyanocobalamin 1000 MCG/ML injection Commonly known as: (VITAMIN B-12) INJECT 1 ML INTO THE MUSCLE DAILY FOR 3 DAYS, THEN WEEKLY FOR 4 WEEKS , THEN MONTHLY THEREAFTER   Dexilant 60 MG capsule Generic drug: dexlansoprazole Take 60 mg by mouth 2 (two) times daily.   Emgality 120 MG/ML Soaj Generic drug: Galcanezumab-gnlm Inject 120 mg into the skin every  30 (thirty) days.   ezetimibe 10 MG tablet Commonly known as: ZETIA Take 1 tablet (10 mg total) by mouth daily.   furosemide 40 MG tablet Commonly known as: LASIX Take 1 tablet (40 mg total) by mouth daily as needed.   hydrochlorothiazide 12.5 MG capsule Commonly known as: MICROZIDE Take 1 capsule (12.5 mg total) by mouth daily.   LORazepam 0.5 MG tablet Commonly known as: ATIVAN Take 1 tablet (0.5 mg total) by mouth daily as needed.   losartan 100 MG tablet Commonly known as: COZAAR TAKE 1 TABLET BY MOUTH EVERY DAY   Motegrity 2 MG Tabs Generic drug: Prucalopride Succinate Take 1 tablet by mouth daily.   nitroGLYCERIN 0.4 MG SL tablet Commonly known as: NITROSTAT Place 1 tablet (0.4 mg total) under the tongue every 5 (five) minutes as needed for chest pain. Maximum of 3 doses.   OneTouch Verio test strip Generic drug: glucose blood USE TO CHECK BLOOD SUGAR AS NEEDED   prochlorperazine 5 MG tablet Commonly known as: COMPAZINE Take 5 mg by mouth every 6 (six) hours as needed for nausea or vomiting.   rOPINIRole 0.25 MG tablet Commonly known as: REQUIP Take 0.25 mg by mouth in the morning and at bedtime.   rosuvastatin 40 MG tablet Commonly known as: CRESTOR Take 1 tablet (40 mg total) by mouth daily.   Symbicort 80-4.5 MCG/ACT inhaler Generic drug: budesonide-formoterol Inhale 2 puffs into the lungs 2 (two) times daily. What changed:   when to take this  reasons to take this   SYRINGE 3CC/25GX1" 25G X 1" 3 ML Misc Use for b12 injections   zolpidem 10 MG tablet Commonly known as: AMBIEN TAKE 1 TABLET BY MOUTH AT BEDTIME AS NEEDED FOR SLEEP.       Allergies:  Allergies  Allergen Reactions  . Avelox [Moxifloxacin] Hives and Swelling    Severe facial swelling  . Contrast Media [Iodinated Diagnostic Agents] Swelling    (OK with pre-medication)  . Crestor [Rosuvastatin Calcium]     myalgia  . Morphine And Related Itching  . Niacin-Simvastatin Er      myalgia  . Latex Rash    Gloves cause raxh  . Tape Dermatitis    Family History: Family History  Problem Relation Age of Onset  . Coronary artery disease Father 43       CABG  . Aortic aneurysm Father 62       repaired  . Hyperlipidemia Father   . Lung cancer Mother   . Hypertension Mother   . Cancer Mother        bladder  . COPD Mother   . Stomach cancer Maternal Grandfather     Social History:  reports that he has never smoked. He has never used smokeless tobacco. He reports that he does not drink alcohol and does not use drugs.   Physical Exam: BP 131/82 (BP Location: Left Arm,  Patient Position: Sitting, Cuff Size: Large)   Pulse 70   Ht 6\' 2"  (1.88 m)   Wt 208 lb (94.3 kg)   BMI 26.71 kg/m   Constitutional:  Alert and oriented, No acute distress. HEENT: [place] AT, moist mucus membranes.  Trachea midline, no masses. Cardiovascular: No clubbing, cyanosis, or edema. Respiratory: Normal respiratory effort, no increased work of breathing. GU: Prostate 45 g, smooth without nodules Lymph: No cervical or inguinal lymphadenopathy. Skin: No rashes, bruises or suspicious lesions. Neurologic: Grossly intact, no focal deficits, moving all 4 extremities. Psychiatric: Normal mood and affect.    Assessment & Plan:    1.  Prostate cancer screening  Benign DRE  Change of 1.92 to 3.11 over a 7-year period is not significant  Continue annual PSA through age 32  2.  Erectile dysfunction  Was interested in a PDE 5 inhibitor trial  Rx tadalafil 20 mg sent to pharmacy and instructed to use at least five times if tolerated before determining failure  Discuss interaction PDE 5 inhibitors with sublingual nitroglycerin   3. BPH without LUTS    [name], MD  [facility] [address], [place], [place] [address] [phone]

## 2020-11-07 ENCOUNTER — Other Ambulatory Visit: Payer: Self-pay

## 2020-11-07 ENCOUNTER — Ambulatory Visit (INDEPENDENT_AMBULATORY_CARE_PROVIDER_SITE_OTHER): Payer: 59

## 2020-11-07 ENCOUNTER — Other Ambulatory Visit (INDEPENDENT_AMBULATORY_CARE_PROVIDER_SITE_OTHER): Payer: 59

## 2020-11-07 DIAGNOSIS — I10 Essential (primary) hypertension: Secondary | ICD-10-CM | POA: Diagnosis not present

## 2020-11-07 DIAGNOSIS — Z8701 Personal history of pneumonia (recurrent): Secondary | ICD-10-CM

## 2020-11-07 NOTE — Addendum Note (Signed)
Addended by: Tor Netters I on: 11/07/2020 02:34 PM   Modules accepted: Orders

## 2020-11-08 LAB — BASIC METABOLIC PANEL
BUN/Creatinine Ratio: 26 (calc) — ABNORMAL HIGH (ref 6–22)
BUN: 28 mg/dL — ABNORMAL HIGH (ref 7–25)
CO2: 30 mmol/L (ref 20–32)
Calcium: 10.2 mg/dL (ref 8.6–10.3)
Chloride: 104 mmol/L (ref 98–110)
Creat: 1.06 mg/dL (ref 0.70–1.25)
Glucose, Bld: 67 mg/dL (ref 65–99)
Potassium: 4.9 mmol/L (ref 3.5–5.3)
Sodium: 142 mmol/L (ref 135–146)

## 2020-11-10 NOTE — Progress Notes (Signed)
Your repeat chest film sis unchanged.  The radiologist feels that  the left lower lobe has developed scarring . I would like to be sure that it is nothing more than that by ordering a CT of the chest for further evaluation.  If you are willing to have this done. Let me know and I will order it.

## 2020-11-11 ENCOUNTER — Other Ambulatory Visit: Payer: Self-pay | Admitting: Internal Medicine

## 2020-11-11 DIAGNOSIS — R0602 Shortness of breath: Secondary | ICD-10-CM

## 2020-11-11 DIAGNOSIS — R059 Cough, unspecified: Secondary | ICD-10-CM

## 2020-11-11 DIAGNOSIS — J849 Interstitial pulmonary disease, unspecified: Secondary | ICD-10-CM

## 2020-11-11 DIAGNOSIS — C8581 Other specified types of non-Hodgkin lymphoma, lymph nodes of head, face, and neck: Secondary | ICD-10-CM

## 2020-11-11 DIAGNOSIS — R9389 Abnormal findings on diagnostic imaging of other specified body structures: Secondary | ICD-10-CM

## 2020-11-11 DIAGNOSIS — C8599 Non-Hodgkin lymphoma, unspecified, extranodal and solid organ sites: Secondary | ICD-10-CM

## 2020-11-17 NOTE — Telephone Encounter (Signed)
-----   Message from Brian Jacobs sent at 11/17/2020 11:56 AM EST ----- Regarding: Ct denial Good morning!  [name] has denied due to pt needing a PFT result. Please let me know once ordered. You can place a PFT schedule order and I can get it scheduled.  Thank you so much!

## 2020-11-18 DIAGNOSIS — R0602 Shortness of breath: Secondary | ICD-10-CM

## 2020-11-22 ENCOUNTER — Other Ambulatory Visit: Payer: Self-pay | Admitting: Physician Assistant

## 2020-11-30 ENCOUNTER — Other Ambulatory Visit: Payer: Self-pay | Admitting: Internal Medicine

## 2020-12-01 ENCOUNTER — Other Ambulatory Visit: Payer: Self-pay

## 2020-12-01 ENCOUNTER — Other Ambulatory Visit
Admission: RE | Admit: 2020-12-01 | Discharge: 2020-12-01 | Disposition: A | Discharge status: Home / Self Care | Payer: 59 | Source: Ambulatory Visit | Attending: Internal Medicine | Admitting: Internal Medicine

## 2020-12-01 DIAGNOSIS — Z20822 Contact with and (suspected) exposure to covid-19: Secondary | ICD-10-CM | POA: Insufficient documentation

## 2020-12-01 DIAGNOSIS — Z01812 Encounter for preprocedural laboratory examination: Secondary | ICD-10-CM | POA: Diagnosis present

## 2020-12-02 ENCOUNTER — Ambulatory Visit: Payer: 59 | Attending: Internal Medicine

## 2020-12-02 DIAGNOSIS — R0602 Shortness of breath: Secondary | ICD-10-CM | POA: Diagnosis not present

## 2020-12-02 DIAGNOSIS — J984 Other disorders of lung: Secondary | ICD-10-CM | POA: Insufficient documentation

## 2020-12-02 LAB — SARS CORONAVIRUS 2 (TAT 6-24 HRS): SARS Coronavirus 2: NEGATIVE

## 2020-12-02 MED ORDER — ALBUTEROL SULFATE (2.5 MG/3ML) 0.083% IN NEBU
2.5000 mg | INHALATION_SOLUTION | Freq: Once | RESPIRATORY_TRACT | Status: AC
  Administered 2020-12-02: 13:00:00 2.5 mg via RESPIRATORY_TRACT
  Filled 2020-12-02: qty 3

## 2020-12-03 LAB — PULMONARY FUNCTION TEST ARMC ONLY
DL/VA % pred: 153 %
DL/VA: 6.29 ml/min/mmHg/L
DLCO unc % pred: 62 %
DLCO unc: 18.9 ml/min/mmHg
FEF 25-75 Post: 0.56 L/sec
FEF 25-75 Pre: 3.37 L/sec
FEF2575-%Change-Post: -83 %
FEF2575-%Pred-Post: 17 %
FEF2575-%Pred-Pre: 106 %
FEV1-%Change-Post: -25 %
FEV1-%Pred-Post: 57 %
FEV1-%Pred-Pre: 76 %
FEV1-Post: 2.29 L
FEV1-Pre: 3.06 L
FEV1FVC-%Change-Post: 0 %
FEV1FVC-%Pred-Pre: 108 %
FEV6-%Change-Post: -23 %
FEV6-%Pred-Post: 55 %
FEV6-%Pred-Pre: 73 %
FEV6-Post: 2.84 L
FEV6-Pre: 3.72 L
FEV6FVC-%Change-Post: 0 %
FEV6FVC-%Pred-Post: 104 %
FEV6FVC-%Pred-Pre: 104 %
FVC-%Change-Post: -24 %
FVC-%Pred-Post: 53 %
FVC-%Pred-Pre: 70 %
FVC-Post: 2.84 L
Post FEV1/FVC ratio: 81 %
Post FEV6/FVC ratio: 100 %
Pre FEV1/FVC ratio: 81 %
Pre FEV6/FVC Ratio: 100 %
RV % pred: 85 %
RV: 2.17 L
TLC % pred: 62 %
TLC: 4.89 L

## 2020-12-04 NOTE — Progress Notes (Signed)
Brian Atkins,  Your pulmonary function tests were abnormal and may explain your shortness of breath. The cause is unclear.  I would like to proceed with a pulmonary consult .  Would you prefer to see a specialist in [place] or elsewhere ?  Regards,   Deborra Medina, MD

## 2020-12-09 ENCOUNTER — Other Ambulatory Visit: Payer: Self-pay | Admitting: Internal Medicine

## 2020-12-09 DIAGNOSIS — R053 Chronic cough: Secondary | ICD-10-CM

## 2020-12-09 DIAGNOSIS — R942 Abnormal results of pulmonary function studies: Secondary | ICD-10-CM

## 2020-12-17 ENCOUNTER — Ambulatory Visit (INDEPENDENT_AMBULATORY_CARE_PROVIDER_SITE_OTHER): Payer: 59 | Admitting: Internal Medicine

## 2020-12-17 ENCOUNTER — Encounter: Payer: Self-pay | Admitting: Internal Medicine

## 2020-12-17 ENCOUNTER — Other Ambulatory Visit: Payer: Self-pay

## 2020-12-17 VITALS — BP 126/82 | HR 75 | Ht 74.0 in | Wt 220.0 lb

## 2020-12-17 DIAGNOSIS — E785 Hyperlipidemia, unspecified: Secondary | ICD-10-CM

## 2020-12-17 DIAGNOSIS — Z79899 Other long term (current) drug therapy: Secondary | ICD-10-CM

## 2020-12-17 DIAGNOSIS — I10 Essential (primary) hypertension: Secondary | ICD-10-CM | POA: Diagnosis not present

## 2020-12-17 DIAGNOSIS — R600 Localized edema: Secondary | ICD-10-CM

## 2020-12-17 DIAGNOSIS — R0602 Shortness of breath: Secondary | ICD-10-CM

## 2020-12-17 DIAGNOSIS — I2510 Atherosclerotic heart disease of native coronary artery without angina pectoris: Secondary | ICD-10-CM

## 2020-12-17 MED ORDER — CARVEDILOL 25 MG PO TABS
12.5000 mg | ORAL_TABLET | Freq: Two times a day (BID) | ORAL | Status: DC
Start: 2020-12-17 — End: 2020-12-17

## 2020-12-17 MED ORDER — CARVEDILOL 12.5 MG PO TABS
12.5000 mg | ORAL_TABLET | Freq: Two times a day (BID) | ORAL | 3 refills | Status: DC
Start: 2020-12-17 — End: 2022-03-30

## 2020-12-17 NOTE — Patient Instructions (Signed)
Medication Instructions:  Your physician has recommended you make the following change in your medication:   DECREASE Carvedilol to 12.5mg  TWICE daily (you may cut current tablet 25mg  in half)  *If you need a refill on your cardiac medications before your next appointment, please call your pharmacy*   Lab Work:  Your physician recommends that you return for lab work in: 6-8 weeks for FASTING Lipid and ALT at the medical mall at [facility] -  Please go to the [facility]. You will check in at the front desk to the right as you walk into the atrium. Valet Parking is offered if needed. - No appointment needed. You may go any day between 7 am and 6 pm.   If you have labs (blood work) drawn today and your tests are completely normal, you will receive your results only by: [facility] MyChart Message (if you have MyChart) OR . A paper copy in the mail If you have any lab test that is abnormal or we need to change your treatment, we will call you to review the results.   Testing/Procedures: None ordered   Follow-Up: At [facility], you and your health needs are our priority.  As part of our continuing mission to provide you with exceptional heart care, we have created designated Provider Care Teams.  These Care Teams include your primary Cardiologist (physician) and Advanced Practice Providers (APPs -  Physician Assistants and Nurse Practitioners) who all work together to provide you with the care you need, when you need it.  We recommend signing up for the patient portal called "MyChart".  Sign up information is provided on this After Visit Summary.  MyChart is used to connect with patients for Virtual Visits (Telemedicine).  Patients are able to view lab/test results, encounter notes, upcoming appointments, etc.  Non-urgent messages can be sent to your provider as well.   To learn more about what you can do with MyChart, go to [facility].    Your next appointment:   3 - 4  month(s)  The format for your next appointment:   In Person  Provider:   You may see [email or web address], MD or one of the following Advanced Practice Providers on your designated Care Team:    Yvonne Kendall, NP  Nicolasa Ducking, PA-C  Eula Listen, PA-C  [place], [place]  [place], NP

## 2020-12-17 NOTE — Progress Notes (Signed)
Follow-up Outpatient Visit Date: 12/17/2020  Primary Care Provider: [name], MD [address] [place] [address]  Chief Complaint: Follow-up shortness of breath  HPI:  Brian Atkins is a 63 y.o. male with history of nonobstructive coronary artery disease by catheterization in 11/2017 and cardiac CTA in 11/2019,hypertension, marginal zone B-cell lymphoma, GERD status post Nissen fundoplication, and [id number] infection in 12/2019, who presents for follow-up of CAD and leg swelling.  He was last seen in our office in late September by Ignacia Bayley, NP, at which time he was most concerned about increased lower extremity swelling.  He was encouraged to wear compression stockings.  Amlodipine was also discontinued.  Today, Brian Atkins reports that he feels fairly similar to our prior visit.  He continues to have intermittent shortness of breath with frequent coughing.  He underwent pulmonary function testing ordered by Dr. Derrel Nip and is scheduled for pulmonary evaluation with Dr. Lanney Gins next month.  He notes occasional mild tightness in his chest especially when he is short of breath or coughing.  This has been stable.  He occasionally checks his oxygen saturations at home and notes them to be in the low to mid 90s.  He denies palpitations, lightheadedness, and syncope.  He notes some dependent leg edema, especially when he is on his feet a lot at work.  He is not exercising regularly but remains quite active at work.  --------------------------------------------------------------------------------------------------  Past Medical History:  Diagnosis Date  . Complication of anesthesia    slow to wake  . Coronary artery disease, non-occlusive    a. LHC 12/18: ostLAD 20%, p/mLAD 60% FFR 0.84, ost ramus 50%, mRCA 50% FFR 0.94, EF 65%  . COVID-19 virus infection 12/28/2019  . Diverticulitis   . Diverticulosis   . Dyslipidemia   . Family history of adverse reaction to anesthesia     Mother - slow to wake  . GERD (gastroesophageal reflux disease)   . Hemorrhoids   . Hiatal hernia   . History of 2019 novel coronavirus disease (COVID-19) 12/24/2019  . History of echocardiogram    a. TTE 1/19: EF of 60-65%, normal wall motion, normal LV diastolic function, mildly dilated LA  . Labile hypertension   . Lymphoma ([place])   . Medial epicondylitis of right elbow   . Meralgia paresthetica of right side   . Microscopic hematuria   . Ocular migraine   . Sleep apnea    CPAP   Past Surgical History:  Procedure Laterality Date  . INTRAVASCULAR PRESSURE WIRE/FFR STUDY N/A 12/07/2017   Procedure: INTRAVASCULAR PRESSURE WIRE/FFR STUDY;  Surgeon: Nelva Bush, MD;  Location: [place] [facility];  Service: Cardiovascular;  Laterality: N/A;  . LEFT HEART CATH AND CORONARY ANGIOGRAPHY Left 12/07/2017   Procedure: LEFT HEART CATH AND CORONARY ANGIOGRAPHY;  Surgeon: Nelva Bush, MD;  Location: [place] [facility];  Service: Cardiovascular;  Laterality: Left;  . nissen funduplication  [id number]   [name]  . SEPTOPLASTY Bilateral 08/23/2019   Procedure: SEPTOPLASTY;  Surgeon: [name], MD;  Location: [place];  Service: ENT;  Laterality: Bilateral;  . SPLENECTOMY    . TURBINATE REDUCTION Bilateral 08/23/2019   Procedure: TURBINATE REDUCTION;  Surgeon: [name], MD;  Location: [place];  Service: ENT;  Laterality: Bilateral;  Latex sensitivity sleep apnea    Current Meds  Medication Sig  . benzonatate (TESSALON) 200 MG capsule Take 1 capsule (200 mg total) by mouth 3 (three) times daily as needed  for cough.  . carvedilol (COREG) 25 MG tablet TAKE 1 TABLET BY MOUTH TWICE A DAY  . cyanocobalamin (,VITAMIN B-12,) 1000 MCG/ML injection INJECT 1 ML INTO THE MUSCLE DAILY FOR 3 DAYS, THEN WEEKLY FOR 4 WEEKS , THEN MONTHLY THEREAFTER  . dexlansoprazole (DEXILANT) 60 MG capsule Take 60 mg by mouth 2 (two) times daily.  [name] ezetimibe (ZETIA) 10 MG tablet TAKE 1  TABLET BY MOUTH EVERY DAY  . furosemide (LASIX) 40 MG tablet Take 1 tablet (40 mg total) by mouth daily as needed.  . Galcanezumab-gnlm (EMGALITY) 120 MG/ML SOAJ Inject 120 mg into the skin every 30 (thirty) days.  . hydrochlorothiazide (MICROZIDE) 12.5 MG capsule Take 1 capsule (12.5 mg total) by mouth daily.  [name] LORazepam (ATIVAN) 0.5 MG tablet Take 1 tablet (0.5 mg total) by mouth daily as needed.  [name] losartan (COZAAR) 100 MG tablet TAKE 1 TABLET BY MOUTH EVERY DAY  . nitroGLYCERIN (NITROSTAT) 0.4 MG SL tablet Place 1 tablet (0.4 mg total) under the tongue every 5 (five) minutes as needed for chest pain. Maximum of 3 doses.  Glory Rosebush VERIO test strip USE TO CHECK BLOOD SUGAR AS NEEDED  . prochlorperazine (COMPAZINE) 5 MG tablet Take 5 mg by mouth every 6 (six) hours as needed for nausea or vomiting.  . Prucalopride Succinate (MOTEGRITY) 2 MG TABS Take 1 tablet by mouth daily.  [name] rOPINIRole (REQUIP) 0.25 MG tablet Take 0.25 mg by mouth in the morning and at bedtime.   . rosuvastatin (CRESTOR) 40 MG tablet Take 1 tablet (40 mg total) by mouth daily.  . SYMBICORT 80-4.5 MCG/ACT inhaler Inhale 2 puffs into the lungs 2 (two) times daily.  . Syringe/Needle, Disp, (SYRINGE 3CC/25GX1") 25G X 1" 3 ML MISC Use for b12 injections  . zolpidem (AMBIEN) 10 MG tablet TAKE 1 TABLET BY MOUTH AT BEDTIME AS NEEDED FOR SLEEP.    Allergies: Avelox [moxifloxacin], Contrast media [iodinated diagnostic agents], Crestor [rosuvastatin calcium], Morphine and related, Niacin-simvastatin er, Latex, and Tape  Social History   Tobacco Use  . Smoking status: Never Smoker  . Smokeless tobacco: Never Used  Vaping Use  . Vaping Use: Never used  Substance Use Topics  . Alcohol use: No  . Drug use: No    Family History  Problem Relation Age of Onset  . Coronary artery disease Father 80       CABG  . Aortic aneurysm Father 30       repaired  . Hyperlipidemia Father   . Lung cancer Mother   . Hypertension Mother    . Cancer Mother        bladder  . COPD Mother   . Stomach cancer Maternal Grandfather     Review of Systems: A 12-system review of systems was performed and was negative except as noted in the HPI.  --------------------------------------------------------------------------------------------------  Physical Exam: BP 126/82 (BP Location: Left Arm, Patient Position: Sitting, Cuff Size: Normal)   Pulse 75   Ht 6\' 2"  (1.88 m)   Wt 220 lb (99.8 kg)   SpO2 95%   BMI 28.25 kg/m   General: NAD. Neck: No JVD or HJR. Lungs: Coarse breath sounds bilaterally without wheezes or crackles.  Good air movement. Heart: Regular rate and rhythm without murmurs, rubs, or gallops. Abdomen: Soft, nontender, nondistended. Extremities: Trace pretibial edema bilaterally.  EKG: Normal sinus rhythm without abnormality.  Lab Results  Component Value Date   WBC 8.2 04/15/2020   HGB 16.1 04/15/2020   HCT 47.1  04/15/2020   MCV 89.7 04/15/2020   PLT 324 04/15/2020    Lab Results  Component Value Date   NA 142 11/07/2020   K 4.9 11/07/2020   CL 104 11/07/2020   CO2 30 11/07/2020   BUN 28 (H) 11/07/2020   CREATININE 1.06 11/07/2020   GLUCOSE 67 11/07/2020   ALT 17 10/24/2020    Lab Results  Component Value Date   CHOL 236 (H) 10/24/2020   HDL 37 (L) 10/24/2020   LDLCALC 162 (H) 10/24/2020   LDLDIRECT 98.0 03/10/2020   TRIG 205 (H) 10/24/2020   CHOLHDL 6.4 (H) 10/24/2020    --------------------------------------------------------------------------------------------------  ASSESSMENT AND PLAN: Nonobstructive coronary artery disease: Overall, symptoms are stable and driven primarily by shortness of breath.  Mr. Mickle Mallory notes some intermittent chest tightness associated with dyspnea and coughing, which is stable.  Given nonobstructive CAD by catheterization in 2018 that appeared stable by cardiac CTA in 11/2019, I do not feel that further cardiac testing or intervention is indicated at this  time.  We will continue to work on medical therapy and risk factor modification to prevent progression of disease, including continued statin therapy.  Of note, recent LDL was quite elevated at 350, prompting escalation of rosuvastatin to 40 mg daily by Dr. [name].  Shortness of breath: Chronic and relatively stable with recent abnormal PFTs.  I agree with pulmonary evaluation.  We have agreed to decrease carvedilol to 12.5 mg twice daily in case beta-blockade is contributing some to his symptoms.  Hyperlipidemia: Lipids remain suboptimally controlled on lipid panel in November.  I agree with escalation of statin therapy and continuation of ezetimibe.  If LDL remains above 70, referral to the lipid clinic for consideration of additional therapies will need to be discussed.  We will plan to check a fasting lipid panel and ALT in about 6 to 8 weeks to assess response.  Hypertension: Blood pressure reasonably well controlled today.  As above, we will cut back on carvedilol to 12.5 mg twice daily.  Losartan 100 mg daily and HCTZ 12.5 mg daily will be continued.  Lower extremity edema: Mostly dependent, likely due to venous insufficiency.  We will continue low-dose HCTZ and as needed furosemide.  Continued sodium restriction, leg elevation, and use compression stockings as tolerated was recommended.  Follow-up: Return to clinic in 3 to 4 months.  Yvonne Kendall, MD 12/17/2020 3:02 PM

## 2020-12-19 ENCOUNTER — Encounter: Payer: Self-pay | Admitting: Internal Medicine

## 2020-12-19 ENCOUNTER — Other Ambulatory Visit: Payer: Self-pay | Admitting: Internal Medicine

## 2020-12-19 DIAGNOSIS — I2510 Atherosclerotic heart disease of native coronary artery without angina pectoris: Secondary | ICD-10-CM | POA: Insufficient documentation

## 2020-12-19 DIAGNOSIS — R600 Localized edema: Secondary | ICD-10-CM | POA: Insufficient documentation

## 2020-12-19 DIAGNOSIS — I25119 Atherosclerotic heart disease of native coronary artery with unspecified angina pectoris: Secondary | ICD-10-CM | POA: Insufficient documentation

## 2020-12-22 NOTE — Telephone Encounter (Signed)
Last OV 11/11/20 ok to fill?

## 2021-01-08 ENCOUNTER — Ambulatory Visit: Admission: RE | Admit: 2021-01-08 | Payer: 59 | Source: Ambulatory Visit

## 2021-01-08 ENCOUNTER — Other Ambulatory Visit: Payer: Self-pay | Admitting: Internal Medicine

## 2021-01-16 ENCOUNTER — Ambulatory Visit: Admission: RE | Admit: 2021-01-16 | Payer: 59 | Source: Ambulatory Visit

## 2021-01-17 ENCOUNTER — Other Ambulatory Visit: Payer: Self-pay | Admitting: Internal Medicine

## 2021-01-20 NOTE — Telephone Encounter (Signed)
Called and spoke to patient. Scheduled a Follow up with Dr. Derrel Nip to discuss a change of medication. Patient has not been seen since November 2021.

## 2021-01-27 ENCOUNTER — Ambulatory Visit: Payer: 59

## 2021-01-30 ENCOUNTER — Other Ambulatory Visit: Payer: Self-pay

## 2021-01-30 ENCOUNTER — Telehealth: Payer: Self-pay | Admitting: Internal Medicine

## 2021-01-30 ENCOUNTER — Ambulatory Visit: Payer: 59 | Admitting: Internal Medicine

## 2021-01-30 ENCOUNTER — Encounter: Payer: Self-pay | Admitting: Internal Medicine

## 2021-01-30 VITALS — BP 124/82 | HR 69 | Temp 98.1°F | Ht 74.02 in | Wt 217.6 lb

## 2021-01-30 DIAGNOSIS — R059 Cough, unspecified: Secondary | ICD-10-CM

## 2021-01-30 DIAGNOSIS — M79675 Pain in left toe(s): Secondary | ICD-10-CM

## 2021-01-30 DIAGNOSIS — I10 Essential (primary) hypertension: Secondary | ICD-10-CM | POA: Diagnosis not present

## 2021-01-30 DIAGNOSIS — I2510 Atherosclerotic heart disease of native coronary artery without angina pectoris: Secondary | ICD-10-CM

## 2021-01-30 DIAGNOSIS — R238 Other skin changes: Secondary | ICD-10-CM | POA: Insufficient documentation

## 2021-01-30 DIAGNOSIS — U071 COVID-19: Secondary | ICD-10-CM | POA: Diagnosis not present

## 2021-01-30 LAB — LIPID PANEL
Cholesterol: 122 mg/dL (ref 0–200)
HDL: 47.1 mg/dL (ref >39.00)
LDL Cholesterol: 60 mg/dL (ref 0–99)
NonHDL: 75.26
Total CHOL/HDL Ratio: 3
Triglycerides: 78 mg/dL (ref 0.0–149.0)
VLDL: 15.6 mg/dL (ref 0.0–40.0)

## 2021-01-30 LAB — COMPREHENSIVE METABOLIC PANEL
ALT: 21 U/L (ref 0–53)
AST: 17 U/L (ref 0–37)
Albumin: 4.2 g/dL (ref 3.5–5.2)
Alkaline Phosphatase: 72 U/L (ref 39–117)
BUN: 19 mg/dL (ref 6–23)
CO2: 32 mEq/L (ref 19–32)
Calcium: 10.3 mg/dL (ref 8.4–10.5)
Chloride: 104 mEq/L (ref 96–112)
Creatinine, Ser: 1.11 mg/dL (ref 0.40–1.50)
GFR: 70.22 mL/min (ref >60.00)
Glucose, Bld: 77 mg/dL (ref 70–99)
Potassium: 4.8 mEq/L (ref 3.5–5.1)
Sodium: 142 mEq/L (ref 135–145)
Total Bilirubin: 0.4 mg/dL (ref 0.2–1.2)
Total Protein: 7.1 g/dL (ref 6.0–8.3)

## 2021-01-30 MED ORDER — ZOLPIDEM TARTRATE ER 12.5 MG PO TBCR: 12.5000 mg | EXTENDED_RELEASE_TABLET | Freq: Every evening | ORAL | 0 refills | Status: DC | PRN

## 2021-01-30 NOTE — Patient Instructions (Signed)
START A FULL STRENGTH ASPIRIN DAILY TODAY  REFERRAL TO VASCULAR SURGERY FOR EVALUATION OF MICROCIRCULATION

## 2021-01-30 NOTE — Telephone Encounter (Signed)
Patient stated that he was suppose to talk to [place] about changing his sleep medication

## 2021-01-30 NOTE — Telephone Encounter (Signed)
My Chart message sent

## 2021-01-30 NOTE — Assessment & Plan Note (Signed)
Seen by [facility] last week:  Dyspnea and chronic cough - unclear etiology as of yet - most symptoms seem post COVID19 - he has scarring of left base on recent CXR which may also be related to [place].  -Spirometry with decrement and FEV1 52% slighly worse then previous study in Dec 2021 - bactrim and prednisone x 1 month today single strength. 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

## 2021-01-30 NOTE — Telephone Encounter (Signed)
Left a message to call back.

## 2021-01-30 NOTE — Telephone Encounter (Signed)
Received a call back from Brian Atkins's wife Brian Atkins. She states that he wanted to move up from the regular Ambien to the Extended Release Ambien as he Is still not sleeping at night.

## 2021-01-30 NOTE — Telephone Encounter (Signed)
Patient was returning call about sleep medication he stated that Dr.Tullo could put the message on mychart

## 2021-01-30 NOTE — Assessment & Plan Note (Signed)
SUSPECTED BASED ON TODAY'S EXAM and history of COVID 19 INFECTION ONE MONTH AGO.  D DIMER ORDERD,  START F/S ASA   VASCULAR SURGERY REFERRAL [facility]

## 2021-01-30 NOTE — Progress Notes (Signed)
Subjective:  Patient ID: Brian Atkins, male    DOB: 02/27/1956  Age: 65 y.o. MRN: [id number]  CC: The primary encounter diagnosis was Toe pain, left. Diagnoses of Cough, Essential hypertension, Coronary artery disease involving native coronary artery of native heart without angina pectoris, and COVID toes were also pertinent to this visit.  HPI Brian Atkins presents for EVALUATION of color changes to 2nd toe  This visit occurred during the SARS-CoV-2 public health emergency.  Safety protocols were in place, including screening questions prior to the visit, additional usage of staff PPE, and extensive cleaning of exam room while observing appropriate contact time as indicated for disinfecting solutions.   COUGH slightly improved with prednisone and septra.   2nd toe on leftfoot has become increasingly more painful  for the last 3 months  Toes stay cold,   Has a neuropathy nailbed  becomes cyanotic and more painful when raised.  Had covid a month ago    Outpatient Medications Prior to Visit  Medication Sig Dispense Refill  . benzonatate (TESSALON) 200 MG capsule Take 1 capsule (200 mg total) by mouth 3 (three) times daily as needed for cough. 60 capsule 1  . carvedilol (COREG) 12.5 MG tablet Take 1 tablet (12.5 mg total) by mouth 2 (two) times daily. 180 tablet 3  . cyanocobalamin (,VITAMIN B-12,) 1000 MCG/ML injection INJECT 1 ML INTO THE MUSCLE DAILY FOR 3 DAYS, THEN WEEKLY FOR 4 WEEKS , THEN MONTHLY THEREAFTER 6 mL 5  . dexlansoprazole (DEXILANT) 60 MG capsule Take 60 mg by mouth 2 (two) times daily.    [name] ezetimibe (ZETIA) 10 MG tablet TAKE 1 TABLET BY MOUTH EVERY DAY 90 tablet 0  . Galcanezumab-gnlm (EMGALITY) 120 MG/ML SOAJ Inject 120 mg into the skin every 30 (thirty) days.    . hydrochlorothiazide (MICROZIDE) 12.5 MG capsule TAKE 1 CAPSULE BY MOUTH EVERY DAY 90 capsule 1  . LORazepam (ATIVAN) 0.5 MG tablet Take 1 tablet (0.5 mg total) by mouth daily as needed. 30 tablet 5  . losartan  (COZAAR) 100 MG tablet TAKE 1 TABLET BY MOUTH EVERY DAY 90 tablet 0  . nitroGLYCERIN (NITROSTAT) 0.4 MG SL tablet Place 1 tablet (0.4 mg total) under the tongue every 5 (five) minutes as needed for chest pain. Maximum of 3 doses. 35 tablet 3  . ONETOUCH VERIO test strip USE TO CHECK BLOOD SUGAR AS NEEDED 100 strip 2  . predniSONE (DELTASONE) 10 MG tablet Take by mouth.    . prochlorperazine (COMPAZINE) 5 MG tablet Take 5 mg by mouth every 6 (six) hours as needed for nausea or vomiting.    . Prucalopride Succinate (MOTEGRITY) 2 MG TABS Take 1 tablet by mouth daily.    [name] rOPINIRole (REQUIP) 0.25 MG tablet Take 0.25 mg by mouth in the morning and at bedtime.     . rosuvastatin (CRESTOR) 40 MG tablet Take 1 tablet (40 mg total) by mouth daily. 90 tablet 1  . sulfamethoxazole-trimethoprim (BACTRIM) 400-80 MG tablet Take by mouth.    . SYMBICORT 80-4.5 MCG/ACT inhaler Inhale 2 puffs into the lungs 2 (two) times daily. 1 Inhaler 5  . Syringe/Needle, Disp, (SYRINGE 3CC/25GX1") 25G X 1" 3 ML MISC Use for b12 injections 50 each 0  . zolpidem (AMBIEN) 10 MG tablet TAKE 1 TABLET BY MOUTH EVERY DAY AT BEDTIME AS NEEDED FOR SLEEP 30 tablet 2  . eszopiclone (LUNESTA) 2 MG TABS tablet Take 2 mg by mouth at bedtime as needed.    [name]  furosemide (LASIX) 40 MG tablet Take 1 tablet (40 mg total) by mouth daily as needed.     No facility-administered medications prior to visit.    Review of Systems;  Patient denies headache, fevers, malaise, unintentional weight loss, skin rash, eye pain, sinus congestion and sinus pain, sore throat, dysphagia,  hemoptysis , cough, dyspnea, wheezing, chest pain, palpitations, orthopnea, edema, abdominal pain, nausea, melena, diarrhea, constipation, flank pain, dysuria, hematuria, urinary  Frequency, nocturia, numbness, tingling, seizures,  Focal weakness, Loss of consciousness,  Tremor, insomnia, depression, anxiety, and suicidal ideation.      Objective:  BP 124/82 (BP Location:  Left Arm, Patient Position: Sitting)   Pulse 69   Temp 98.1 F (36.7 C)   Ht 6' 2.02" (1.88 m)   Wt 217 lb 9.6 oz (98.7 kg)   SpO2 96%   BMI 27.93 kg/m   BP Readings from Last 3 Encounters:  01/30/21 124/82  12/17/20 126/82  10/30/20 131/82    Wt Readings from Last 3 Encounters:  01/30/21 217 lb 9.6 oz (98.7 kg)  12/17/20 220 lb (99.8 kg)  10/30/20 208 lb (94.3 kg)    General appearance: alert, cooperative and appears stated age Ears: normal TM's and external ear canals both ears Throat: lips, mucosa, and tongue normal; teeth and gums normal Neck: no adenopathy, no carotid bruit, supple, symmetrical, trachea midline and thyroid not enlarged, symmetric, no tenderness/mass/nodules Back: symmetric, no curvature. ROM normal. No CVA tenderness. Lungs: clear to auscultation bilaterally Heart: regular rate and rhythm, S1, S2 normal, no murmur, click, rub or gallop Abdomen: soft, non-tender; bowel sounds normal; no masses,  no organomegaly Pulses: 2+ and symmetric Skin: 2nd toe of left foot violaceous distally,  Nonblanching,  Cold.  Skin color, texture, turgor normal. No rashes or lesions Lymph nodes: Cervical, supraclavicular, and axillary nodes normal.  Lab Results  Component Value Date   HGBA1C 6.0 09/23/2020   HGBA1C 5.8 03/10/2020   HGBA1C 5.9 03/13/2019    Lab Results  Component Value Date   CREATININE 1.06 11/07/2020   CREATININE 1.18 10/24/2020   CREATININE 1.13 09/23/2020    Lab Results  Component Value Date   WBC 8.2 04/15/2020   HGB 16.1 04/15/2020   HCT 47.1 04/15/2020   PLT 324 04/15/2020   GLUCOSE 67 11/07/2020   CHOL 236 (H) 10/24/2020   TRIG 205 (H) 10/24/2020   HDL 37 (L) 10/24/2020   LDLDIRECT 98.0 03/10/2020   LDLCALC 162 (H) 10/24/2020   ALT 17 10/24/2020   AST 17 10/24/2020   NA 142 11/07/2020   K 4.9 11/07/2020   CL 104 11/07/2020   CREATININE 1.06 11/07/2020   BUN 28 (H) 11/07/2020   CO2 30 11/07/2020   TSH 2.18 09/23/2020   PSA  3.11 10/24/2020   INR 1.0 11/30/2017   HGBA1C 6.0 09/23/2020   MICROALBUR 0.5 10/24/2020    Pulmonary Function Test [facility] Only  Result Date: 12/03/2020 Spirometry Data Is Acceptable and Reproducible Moderate RESTRICTIVE LUNG Disease without  Significant Broncho-Dilator Response Consider underlying ILD Consider outpatient Pulmonary Consultation if needed Clinical Correlation Advised    Assessment & Plan:   Problem List Items Addressed This Visit      Unprioritized   Coronary artery disease involving native coronary artery of native heart without angina pectoris   Relevant Orders   Lipid panel   Cough    Seen by [facility] last week:  Dyspnea and chronic cough - unclear etiology as of yet - most symptoms seem  post COVID19 - he has scarring of left base on recent CXR which may also be related to [place].  -Spirometry with decrement and FEV1 52% slighly worse then previous study in Dec 2021 - bactrim and prednisone x 1 month today single strength. 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  D DIMER ORDERD,  START F/S ASA   VASCULAR SURGERY REFERRAL [facility]       Relevant Medications   sulfamethoxazole-trimethoprim (BACTRIM) 400-80 MG tablet   Essential hypertension   Relevant Orders   Comprehensive metabolic panel    Other Visit Diagnoses    Toe pain, left    -  Primary   Relevant Orders   Ambulatory referral to Vascular Surgery   D-Dimer, Quantitative      I am having [name] maintain his nitroGLYCERIN, SYRINGE 3CC/25GX1", OneTouch Verio, Dexilant, Emgality, Motegrity, prochlorperazine, benzonatate, Symbicort, cyanocobalamin, rOPINIRole, furosemide,  LORazepam, rosuvastatin, ezetimibe, losartan, carvedilol, zolpidem, hydrochlorothiazide, sulfamethoxazole-trimethoprim, eszopiclone, and predniSONE.  No orders of the defined types were placed in this encounter.   There are no discontinued medications.  Follow-up: No follow-ups on file.   [name], MD

## 2021-01-31 LAB — D-DIMER, QUANTITATIVE: D-Dimer, Quant: 0.3 mcg/mL FEU (ref <0.50)

## 2021-02-01 NOTE — Progress Notes (Signed)
Your labs are normal.  There is No evidence of a blood clot .  Regards,   Deborra Medina, MD

## 2021-02-06 ENCOUNTER — Ambulatory Visit
Admission: RE | Admit: 2021-02-06 | Discharge: 2021-02-06 | Disposition: A | Discharge status: Home / Self Care | Payer: 59 | Source: Ambulatory Visit | Attending: Internal Medicine | Admitting: Internal Medicine

## 2021-02-06 ENCOUNTER — Ambulatory Visit
Admission: RE | Admit: 2021-02-06 | Discharge: 2021-02-06 | Disposition: A | Discharge status: Home / Self Care | Payer: 59 | Source: Home / Self Care | Attending: Internal Medicine | Admitting: Internal Medicine

## 2021-02-06 ENCOUNTER — Other Ambulatory Visit: Payer: Self-pay

## 2021-02-06 DIAGNOSIS — J849 Interstitial pulmonary disease, unspecified: Secondary | ICD-10-CM | POA: Diagnosis not present

## 2021-02-08 ENCOUNTER — Encounter: Payer: Self-pay | Admitting: Internal Medicine

## 2021-02-08 DIAGNOSIS — J479 Bronchiectasis, uncomplicated: Secondary | ICD-10-CM | POA: Insufficient documentation

## 2021-02-08 DIAGNOSIS — J948 Other specified pleural conditions: Secondary | ICD-10-CM | POA: Insufficient documentation

## 2021-02-08 DIAGNOSIS — I700 Atherosclerosis of aorta: Secondary | ICD-10-CM | POA: Insufficient documentation

## 2021-02-19 ENCOUNTER — Other Ambulatory Visit (INDEPENDENT_AMBULATORY_CARE_PROVIDER_SITE_OTHER): Payer: Self-pay | Admitting: Vascular Surgery

## 2021-02-19 DIAGNOSIS — M79675 Pain in left toe(s): Secondary | ICD-10-CM

## 2021-02-20 ENCOUNTER — Other Ambulatory Visit: Payer: Self-pay | Admitting: Physician Assistant

## 2021-02-24 ENCOUNTER — Ambulatory Visit (INDEPENDENT_AMBULATORY_CARE_PROVIDER_SITE_OTHER): Payer: 59

## 2021-02-24 ENCOUNTER — Ambulatory Visit (INDEPENDENT_AMBULATORY_CARE_PROVIDER_SITE_OTHER): Payer: 59 | Admitting: Vascular Surgery

## 2021-02-24 ENCOUNTER — Other Ambulatory Visit: Payer: Self-pay

## 2021-02-24 ENCOUNTER — Encounter (INDEPENDENT_AMBULATORY_CARE_PROVIDER_SITE_OTHER): Payer: Self-pay | Admitting: Vascular Surgery

## 2021-02-24 VITALS — BP 128/80 | HR 69 | Resp 16 | Ht 74.0 in | Wt 222.0 lb

## 2021-02-24 DIAGNOSIS — R202 Paresthesia of skin: Secondary | ICD-10-CM

## 2021-02-24 DIAGNOSIS — I10 Essential (primary) hypertension: Secondary | ICD-10-CM

## 2021-02-24 DIAGNOSIS — I7300 Raynaud's syndrome without gangrene: Secondary | ICD-10-CM | POA: Diagnosis not present

## 2021-02-24 DIAGNOSIS — R200 Anesthesia of skin: Secondary | ICD-10-CM | POA: Diagnosis not present

## 2021-02-24 DIAGNOSIS — U071 COVID-19: Secondary | ICD-10-CM

## 2021-02-24 DIAGNOSIS — M79675 Pain in left toe(s): Secondary | ICD-10-CM | POA: Diagnosis not present

## 2021-02-24 DIAGNOSIS — R238 Other skin changes: Secondary | ICD-10-CM

## 2021-02-24 NOTE — Patient Instructions (Signed)
 Raynaud Phenomenon  Raynaud phenomenon is a condition that affects the blood vessels (arteries) that carry blood to your fingers and toes. The arteries that supply blood to your ears, lips, nipples, or the tip of your nose might also be affected. Raynaud phenomenon causes the arteries to become narrow temporarily (spasm). As a result, the flow of blood to the affected areas is temporarily decreased. This usually occurs in response to cold temperatures or stress. During an attack, the skin in the affected areas turns white, then blue, and finally red. You may also feel tingling or numbness in those areas. Attacks usually last for only a brief period, and then the blood flow to the area returns to normal. In most cases, Raynaud phenomenon does not cause serious health problems. What are the causes? In many cases, the cause of this condition is not known. The condition may occur on its own (primary Raynaud phenomenon) or may be associated with other diseases or factors (secondary Raynaud phenomenon). Possible causes may include:  Diseases or medical conditions that damage the arteries.  Injuries and repetitive actions that hurt the hands or feet.  Being exposed to certain chemicals.  Taking medicines that narrow the arteries.  Other medical conditions, such as lupus, scleroderma, rheumatoid arthritis, thyroid problems, blood disorders, Sjogren syndrome, or atherosclerosis. What increases the risk? The following factors may make you more likely to develop this condition:  Being 20-40 years old.  Being male.  Having a family history of Raynaud phenomenon.  Living in a cold climate.  Smoking. What are the signs or symptoms? Symptoms of this condition usually occur when you are exposed to cold temperatures or when you have emotional stress. The symptoms may last for a few minutes or up to several hours. They usually affect your fingers but may also affect your toes, nipples, lips, ears,  or the tip of your nose. Symptoms may include:  Changes in skin color. The skin in the affected areas will turn pale or white. The skin may then change from white to bluish to red as normal blood flow returns to the area.  Numbness, tingling, or pain in the affected areas. In severe cases, symptoms may include:  Skin sores.  Tissues decaying and dying (gangrene). How is this diagnosed? This condition may be diagnosed based on:  Your symptoms and medical history.  A physical exam. During the exam, you may be asked to put your hands in cold water to check for a reaction to cold temperature.  Tests, such as: ? Blood tests to check for other diseases or conditions. ? A test to check the movement of blood through your arteries and veins (vascular ultrasound). ? A test in which the skin at the base of your fingernail is examined under a microscope (nailfold capillaroscopy). How is this treated? Treatment for this condition often involves making lifestyle changes and taking steps to control your exposure to cold temperatures. For more severe cases, medicine (calcium channel blockers) may be used to improve blood flow. Surgery is sometimes done to block the nerves that control the affected arteries, but this is rare. Follow these instructions at home: Avoiding cold temperatures Take these steps to avoid exposure to cold:  If possible, stay indoors during cold weather.  When you go outside during cold weather, dress in layers and wear mittens, a hat, a scarf, and warm footwear.  Wear mittens or gloves when handling ice or frozen food.  Use holders for glasses or cans containing cold drinks.    Let warm water run for a while before taking a shower or bath.  Warm up the car before driving in cold weather. Lifestyle  If possible, avoid stressful and emotional situations. Try to find ways to manage your stress, such as: ? Exercise. ? Yoga. ? Meditation. ? Biofeedback.  Do not use any  products that contain nicotine or tobacco, such as cigarettes and e-cigarettes. If you need help quitting, ask your health care provider.  Avoid secondhand smoke.  Limit your use of caffeine. ? Switch to decaffeinated coffee, tea, and soda. ? Avoid chocolate.  Avoid vibrating tools and machinery. General instructions  Protect your hands and feet from injuries, cuts, or bruises.  Avoid wearing tight rings or wristbands.  Wear loose fitting socks and comfortable, roomy shoes.  Take over-the-counter and prescription medicines only as told by your health care provider. Contact a health care provider if:  Your discomfort becomes worse despite lifestyle changes.  You develop sores on your fingers or toes that do not heal.  Your fingers or toes turn black.  You have breaks in the skin on your fingers or toes.  You have a fever.  You have pain or swelling in your joints.  You have a rash.  Your symptoms occur on only one side of your body. Summary  Raynaud phenomenon is a condition that affects the arteries that carry blood to your fingers, toes, ears, lips, nipples, or the tip of your nose.  In many cases, the cause of this condition is not known.  Symptoms of this condition include changes in skin color, and numbness and tingling of the affected area.  Treatment for this condition includes lifestyle changes, reducing exposure to cold temperatures, and using medicines for severe cases of the condition.  Contact your health care provider if your condition worsens despite treatment. This information is not intended to replace advice given to you by your health care provider. Make sure you discuss any questions you have with your health care provider. Document Revised: 04/17/2020 Document Reviewed: 04/17/2020 [employer] Patient Education  2021 [employer]  

## 2021-02-24 NOTE — Assessment & Plan Note (Signed)
He is seeing a neurologist and has been diagnosed with neuropathy which I think is in part contributing to his toe symptoms.

## 2021-02-24 NOTE — Progress Notes (Signed)
Patient ID: Brian Atkins, male   DOB: June 13, 1956, 65 y.o.   MRN: [id number]  Chief Complaint  Patient presents with  . New Patient (Initial Visit)    Ref Tullo left toe    HPI Brian Atkins is a 65 y.o. male.  I am asked to see the patient by Dr. Derrel Nip for evaluation of painful toes on both feet but most notably on the left.  They are also associated with marked discoloration particularly in the left second toe and both big toes to some degree.  This has been going on for several months.  This seems to be dramatically worsened when he gets cold.  Cold stimulation seems to be a nidus for when this gets severe.  It is less severe when he is not cold.  This also seem to get worse after Covid.  He had Covid infection a few months ago.  He denies any open wounds or signs of infection.  No fevers or chills.  The patient has not had any lower extremity revascularization or intervention to his knowledge.  He is being treated with neurology for neuropathy.   Noninvasive studies were performed today showing normal triphasic waveforms and pressures bilaterally with a right ABI of 1.29 and left ABI of 1.20.  His digital pressures were normal as well even with the discoloration of the toes.   Past Medical History:  Diagnosis Date  . Complication of anesthesia    slow to wake  . Coronary artery disease, non-occlusive    a. LHC 12/18: ostLAD 20%, p/mLAD 60% FFR 0.84, ost ramus 50%, mRCA 50% FFR 0.94, EF 65%  . COVID-19 virus infection 12/28/2019  . Diverticulitis   . Diverticulosis   . Dyslipidemia   . Family history of adverse reaction to anesthesia    Mother - slow to wake  . GERD (gastroesophageal reflux disease)   . Hemorrhoids   . Hiatal hernia   . History of 2019 novel coronavirus disease (COVID-19) 12/24/2019  . History of echocardiogram    a. TTE 1/19: EF of 60-65%, normal wall motion, normal LV diastolic function, mildly dilated LA  . Labile hypertension   . Lymphoma ([place])   . Medial  epicondylitis of right elbow   . Meralgia paresthetica of right side   . Microscopic hematuria   . Ocular migraine   . Sleep apnea    CPAP    Past Surgical History:  Procedure Laterality Date  . INTRAVASCULAR PRESSURE WIRE/FFR STUDY N/A 12/07/2017   Procedure: INTRAVASCULAR PRESSURE WIRE/FFR STUDY;  Surgeon: Nelva Bush, MD;  Location: [facility];  Service: Cardiovascular;  Laterality: N/A;  . LEFT HEART CATH AND CORONARY ANGIOGRAPHY Left 12/07/2017   Procedure: LEFT HEART CATH AND CORONARY ANGIOGRAPHY;  Surgeon: Nelva Bush, MD;  Location: [facility];  Service: Cardiovascular;  Laterality: Left;  . nissen funduplication  [id number]   Matt Nekesha Font  . SEPTOPLASTY Bilateral 08/23/2019   Procedure: SEPTOPLASTY;  Surgeon: [name], MD;  Location: [facility];  Service: ENT;  Laterality: Bilateral;  . SPLENECTOMY    . TURBINATE REDUCTION Bilateral 08/23/2019   Procedure: TURBINATE REDUCTION;  Surgeon: [name], MD;  Location: [facility];  Service: ENT;  Laterality: Bilateral;  Latex sensitivity sleep apnea     Family History  Problem Relation Age of Onset  . Coronary artery disease Father 44       CABG  . Aortic aneurysm Father 30  repaired  . Hyperlipidemia Father   . Lung cancer Mother   . Hypertension Mother   . Cancer Mother        bladder  . COPD Mother   . Stomach cancer Maternal Grandfather      Social History   Tobacco Use  . Smoking status: Never Smoker  . Smokeless tobacco: Never Used  Vaping Use  . Vaping Use: Never used  Substance Use Topics  . Alcohol use: No  . Drug use: No    Allergies  Allergen Reactions  . Avelox [Moxifloxacin] Hives and Swelling    Severe facial swelling  . Contrast Media [Iodinated Diagnostic Agents] Swelling    (OK with pre-medication)  . Crestor [Rosuvastatin Calcium]     myalgia  . Morphine And Related Itching  . Niacin-Simvastatin Er     myalgia  . Latex Rash     Gloves cause raxh  . Tape Dermatitis    Current Outpatient Medications  Medication Sig Dispense Refill  . aspirin 325 MG tablet Take 325 mg by mouth daily.    . benzonatate (TESSALON) 200 MG capsule Take 1 capsule (200 mg total) by mouth 3 (three) times daily as needed for cough. 60 capsule 1  . carvedilol (COREG) 12.5 MG tablet Take 1 tablet (12.5 mg total) by mouth 2 (two) times daily. 180 tablet 3  . cyanocobalamin (,VITAMIN B-12,) 1000 MCG/ML injection INJECT 1 ML INTO THE MUSCLE DAILY FOR 3 DAYS, THEN WEEKLY FOR 4 WEEKS , THEN MONTHLY THEREAFTER 6 mL 5  . eszopiclone (LUNESTA) 2 MG TABS tablet Take 2 mg by mouth at bedtime as needed.    . ezetimibe (ZETIA) 10 MG tablet TAKE 1 TABLET BY MOUTH EVERY DAY 90 tablet 0  . Galcanezumab-gnlm (EMGALITY) 120 MG/ML SOAJ Inject 120 mg into the skin every 30 (thirty) days.    . hydrochlorothiazide (MICROZIDE) 12.5 MG capsule TAKE 1 CAPSULE BY MOUTH EVERY DAY 90 capsule 1  . losartan (COZAAR) 100 MG tablet TAKE 1 TABLET BY MOUTH EVERY DAY 90 tablet 0  . nitroGLYCERIN (NITROSTAT) 0.4 MG SL tablet Place 1 tablet (0.4 mg total) under the tongue every 5 (five) minutes as needed for chest pain. Maximum of 3 doses. 35 tablet 3  . omeprazole (PRILOSEC) 40 MG capsule Take 40 mg by mouth daily.    Glory Rosebush VERIO test strip USE TO CHECK BLOOD SUGAR AS NEEDED 100 strip 2  . prochlorperazine (COMPAZINE) 5 MG tablet Take 5 mg by mouth every 6 (six) hours as needed for nausea or vomiting.    . Prucalopride Succinate (MOTEGRITY) 2 MG TABS Take 1 tablet by mouth daily.    [name] rOPINIRole (REQUIP) 0.25 MG tablet Take 0.25 mg by mouth in the morning and at bedtime.     . rosuvastatin (CRESTOR) 40 MG tablet Take 1 tablet (40 mg total) by mouth daily. 90 tablet 1  . SYMBICORT 80-4.5 MCG/ACT inhaler Inhale 2 puffs into the lungs 2 (two) times daily. 1 Inhaler 5  . Syringe/Needle, Disp, (SYRINGE 3CC/25GX1") 25G X 1" 3 ML MISC Use for b12 injections 50 each 0  . zolpidem  (AMBIEN CR) 12.5 MG CR tablet Take 1 tablet (12.5 mg total) by mouth at bedtime as needed for sleep. 30 tablet 0  . dexlansoprazole (DEXILANT) 60 MG capsule Take 60 mg by mouth 2 (two) times daily. (Patient not taking: Reported on 02/24/2021)    . furosemide (LASIX) 40 MG tablet Take 1 tablet (40 mg total) by mouth  daily as needed.    . predniSONE (DELTASONE) 10 MG tablet Take by mouth. (Patient not taking: Reported on 02/24/2021)     No current facility-administered medications for this visit.      REVIEW OF SYSTEMS (Negative unless checked)  Constitutional: [] Weight loss  [] Fever  [] Chills Cardiac: [] Chest pain   [] Chest pressure   [] Palpitations   [] Shortness of breath when laying flat   [] Shortness of breath at rest   [] Shortness of breath with exertion. Vascular:  [] Pain in legs with walking   [] Pain in legs at rest   [] Pain in legs when laying flat   [] Claudication   [] Pain in feet when walking  [] Pain in feet at rest  [] Pain in feet when laying flat   [] History of DVT   [] Phlebitis   [] Swelling in legs   [] Varicose veins   [] Non-healing ulcers Pulmonary:   [] Uses home oxygen   [] Productive cough   [] Hemoptysis   [] Wheeze  [] COPD   [] Asthma Neurologic:  [] Dizziness  [] Blackouts   [] Seizures   [] History of stroke   [] History of TIA  [] Aphasia   [] Temporary blindness   [] Dysphagia   [] Weakness or numbness in arms   [x] Weakness or numbness in legs Musculoskeletal:  [] Arthritis   [] Joint swelling   [] Joint pain   [] Low back pain Hematologic:  [] Easy bruising  [] Easy bleeding   [] Hypercoagulable state   [] Anemic  [] Hepatitis Gastrointestinal:  [] Blood in stool   [] Vomiting blood  [] Gastroesophageal reflux/heartburn   [] Abdominal pain Genitourinary:  [] Chronic kidney disease   [] Difficult urination  [] Frequent urination  [] Burning with urination   [] Hematuria Skin:  [] Rashes   [] Ulcers   [] Wounds Psychological:  [] History of anxiety   []  History of major depression.    Physical Exam BP 128/80  (BP Location: Right Arm)   Pulse 69   Resp 16   Ht 6\' 2"  (1.88 m)   Wt 222 lb (100.7 kg)   BMI 28.50 kg/m  Gen:  WD/WN, NAD Head: [place]/AT, No temporalis wasting.  Ear/Nose/Throat: Hearing grossly intact, nares w/o erythema or drainage, oropharynx w/o Erythema/Exudate Eyes: Conjunctiva clear, sclera non-icteric  Neck: trachea midline.  No JVD.  Pulmonary:  Good air movement, respirations not labored, no use of accessory muscles  Cardiac: RRR, no JVD Vascular:  Vessel Right Left  Radial Palpable Palpable                          DP  2+  2+  PT  2+  2+   Gastrointestinal:. No masses, surgical incisions, or scars. Musculoskeletal: M/S 5/5 throughout.  Cyanosis is present somewhat in both great toes and more prominent in the left second toe.  Capillary refill is sluggish in the toes.  No significant edema Neurologic: Sensation grossly intact in extremities.  Symmetrical.  Speech is fluent. Motor exam as listed above. Psychiatric: Judgment intact, Mood & affect appropriate for pt's clinical situation. Dermatologic: No rashes or ulcers noted.  No cellulitis or open wounds.    Radiology CT Chest Wo Contrast  Result Date: 02/08/2021 CLINICAL DATA:  Interstitial lung disease Pneumonia, effusion or abscess suspected, xray done Dyspnea, chronic, unclear etiology COVID-19 in March 2020 and November 2021. EXAM: CT CHEST WITHOUT CONTRAST TECHNIQUE: Multidetector CT imaging of the chest was performed following the standard protocol without IV contrast. COMPARISON:  Chest radiograph 11/07/2020, included portions from cardiac CT 11/07/2019, and abdominal CT 05/03/2014 FINDINGS: Cardiovascular: Heart is normal in size. There are  coronary artery calcifications. Mild aortic atherosclerosis. No aortic aneurysm. No pericardial effusion. Mediastinum/Nodes: No enlarged mediastinal or axillary lymph nodes. No bulky hilar adenopathy, detailed hilar assessment is limited on this noncontrast exam. No  visualized thyroid nodule. Small to moderate hiatal hernia. Surgical clips at the gastroesophageal junction, patient is post Nissen procedure. Lungs/Pleura: Bandlike and linear opacities in the dependent left lower lobe with minimal peripheral pleural thickening consistent with scarring. Linear bandlike opacity in the dependent right lower lobe may represent atelectasis or scarring. Mild left lower lobe bronchiectasis. No evidence of pneumonia, no ground-glass opacity typically seen with COVID-19. 3 mm left upper lobe pulmonary nodule, series 3, image 80, not previously included in the field of view. Punctate right lower lobe nodule in the lower right lower lobe, series 3, image 147, appears calcified and consistent with granuloma. No honeycombing or reticulation. Trachea and central bronchi are patent. No pleural fluid. Upper Abdomen: Low-density lesion in the dome of the liver measuring 2.7 cm most consistent with cyst. This has slightly increased in size from prior exam. Splenectomy. No upper abdominal adenopathy. Musculoskeletal: There are no acute or suspicious osseous abnormalities. IMPRESSION: 1. Bandlike and linear opacities in the dependent left lower lobe with minimal peripheral pleural thickening consistent with scarring. Linear bandlike opacity in the dependent right lower lobe may represent atelectasis or scarring. 2. Mild left lower lobe bronchiectasis. No other findings to suggest interstitial lung disease. If there is persistent clinical concern for interstitial lung disease, high-resolution chest CT could be considered. 3. A 3 mm left upper lobe pulmonary nodule, not previously included in the field of view. No follow-up needed if patient is low-risk. Non-contrast chest CT can be considered in 12 months if patient is high-risk. This recommendation follows the consensus statement: Guidelines for Management of Incidental Pulmonary Nodules Detected on CT Images: From the Fleischner Society 2017;  Radiology 2017; 284:228-243. There is also a benign calcified granuloma at the right lung base. 4. Coronary artery calcifications. 5. Small hiatal hernia, post Nissen. Aortic Atherosclerosis (ICD10-I70.0). Electronically Signed   By: Keith Rake M.D.   On: 02/08/2021 12:10    Labs Recent Results (from the past 2160 hour(s))  SARS CORONAVIRUS 2 (TAT 6-24 HRS) Nasopharyngeal Nasopharyngeal Swab     Status: None   Collection Time: 12/01/20 11:26 AM   Specimen: Nasopharyngeal Swab  Result Value Ref Range   SARS Coronavirus 2 NEGATIVE NEGATIVE    Comment: (NOTE) SARS-CoV-2 target nucleic acids are NOT DETECTED.  The SARS-CoV-2 RNA is generally detectable in upper and lower respiratory specimens during the acute phase of infection. Negative results do not preclude SARS-CoV-2 infection, do not rule out co-infections with other pathogens, and should not be used as the sole basis for treatment or other patient management decisions. Negative results must be combined with clinical observations, patient history, and epidemiological information. The expected result is Negative.  Fact Sheet for Patients: SugarRoll.be  Fact Sheet for Healthcare Providers: https://www.woods-mathews.com/  This test is not yet approved or cleared by the [place] FDA and  has been authorized for detection and/or diagnosis of SARS-CoV-2 by FDA under an Emergency Use Authorization (EUA). This EUA will remain  in effect (meaning this test can be used) for the duration of the COVID-19 declaration under Se ction 564(b)(1) of the Act, 21 U.S.C. section 360bbb-3(b)(1), unless the authorization is terminated or revoked sooner.  Performed at [facility], [place] [address], [place], [place] [address]   Pulmonary Function Test [facility] Only  Status: None   Collection Time: 12/02/20  1:49 PM  Result Value Ref Range   FVC-%Pred-Pre 70 %   FVC-Post 2.84 L    FVC-%Pred-Post 53 %   FVC-%Change-Post -24 %   FEV1-Pre 3.06 L   FEV1-%Pred-Pre 76 %   FEV1-Post 2.29 L   FEV1-%Pred-Post 57 %   FEV1-%Change-Post -25 %   FEV6-Pre 3.72 L   FEV6-%Pred-Pre 73 %   FEV6-Post 2.84 L   FEV6-%Pred-Post 55 %   FEV6-%Change-Post -23 %   Pre FEV1/FVC ratio 81 %   FEV1FVC-%Pred-Pre 108 %   Post FEV1/FVC ratio 81 %   FEV1FVC-%Change-Post 0 %   Pre FEV6/FVC Ratio 100 %   FEV6FVC-%Pred-Pre 104 %   Post FEV6/FVC ratio 100 %   FEV6FVC-%Pred-Post 104 %   FEV6FVC-%Change-Post 0 %   FEF 25-75 Pre 3.37 L/sec   FEF2575-%Pred-Pre 106 %   FEF 25-75 Post 0.56 L/sec   FEF2575-%Pred-Post 17 %   FEF2575-%Change-Post -83 %   RV 2.17 L   RV % pred 85 %   TLC 4.89 L   TLC % pred 62 %   DLCO unc 18.90 ml/min/mmHg   DLCO unc % pred 62 %   DL/VA 6.29 ml/min/mmHg/L   DL/VA % pred 153 %  Lipid panel     Status: None   Collection Time: 01/30/21 10:03 AM  Result Value Ref Range   Cholesterol 122 0 - 200 mg/dL    Comment: ATP III Classification       Desirable:  < 200 mg/dL               Borderline High:  200 - 239 mg/dL          High:  > = 240 mg/dL   Triglycerides 78.0 0.0 - 149.0 mg/dL    Comment: Normal:  <150 mg/dLBorderline High:  150 - 199 mg/dL   HDL 47.10 >39.00 mg/dL   VLDL 15.6 0.0 - 40.0 mg/dL   LDL Cholesterol 60 0 - 99 mg/dL   Total CHOL/HDL Ratio 3     Comment:                Men          Women1/2 Average Risk     3.4          3.3Average Risk          5.0          4.42X Average Risk          9.6          7.13X Average Risk          15.0          11.0                       NonHDL 75.26     Comment: NOTE:  Non-HDL goal should be 30 mg/dL higher than patient's LDL goal (i.e. LDL goal of < 70 mg/dL, would have non-HDL goal of < 100 mg/dL)  Comprehensive metabolic panel     Status: None   Collection Time: 01/30/21 10:03 AM  Result Value Ref Range   Sodium 142 135 - 145 mEq/L   Potassium 4.8 3.5 - 5.1 mEq/L   Chloride 104 96 - 112 mEq/L   CO2 32 19 - 32  mEq/L   Glucose, Bld 77 70 - 99 mg/dL   BUN 19 6 - 23 mg/dL   Creatinine, Ser 1.11 0.40 -  1.50 mg/dL   Total Bilirubin 0.4 0.2 - 1.2 mg/dL   Alkaline Phosphatase 72 39 - 117 U/L   AST 17 0 - 37 U/L   ALT 21 0 - 53 U/L   Total Protein 7.1 6.0 - 8.3 g/dL   Albumin 4.2 3.5 - 5.2 g/dL   GFR 70.22 >60.00 mL/min    Comment: Calculated using the CKD-EPI Creatinine Equation (2021)   Calcium 10.3 8.4 - 10.5 mg/dL  D-Dimer, Quantitative     Status: None   Collection Time: 01/30/21 10:03 AM  Result Value Ref Range   D-Dimer, Quant 0.30 <0.50 mcg/mL FEU    Comment: . The D-Dimer test is used frequently to exclude an acute PE or DVT. In patients with a low to moderate clinical risk assessment and a D-Dimer result <0.50 mcg/mL FEU, the likelihood of a PE or DVT is very low. However, a thromboembolic event should not be excluded solely on the basis of the D-Dimer level. Increased levels of D-Dimer are associated with a PE, DVT, DIC, malignancies, inflammation, sepsis, surgery, trauma, pregnancy, and advancing patient age. [Jama 2006 11:295(2):199-207] . For additional information, please refer to: http://education.questdiagnostics.com/faq/FAQ149 (This link is being provided for informational/ educational purposes only) .     Assessment/Plan:  COVID toes Patient was diagnosed with this after his Covid infection recently.  This may be in part the case, but I think there is also a component of Raynaud's disease and neuropathy contributing to his symptoms as well.  No significant PAD was present.  Essential hypertension blood pressure control important in reducing the progression of atherosclerotic disease. On appropriate oral medications.   Numbness and tingling of upper and lower extremities of both sides He is seeing a neurologist and has been diagnosed with neuropathy which I think is in part contributing to his toe symptoms.  Raynaud's disease without gangrene Noninvasive studies  were performed today showing normal triphasic waveforms and pressures bilaterally with a right ABI of 1.29 and left ABI of 1.20.  His digital pressures were normal as well even with the discoloration of the toes.  I think his lower extremity symptoms are multifactorial with a component of Raynaud's disease as the cold-induced spasm and worsening discoloration and pain is clearly present and gets better with warming.  There may be a component of Covid toe as well as neuropathy contributing to his lower extremity symptoms as well.  We discussed that I would not use any medication for Raynaud's disease at this point as utility would be limited for now.  I recommended avoidance of cold stimulation with trying to wear socks pretty much all the time and avoiding cold as best possible.  I have discussed the pathophysiology and natural history of Raynaud's disease.  No arterial intervention is required at this time.  I will plan to see him back in about 6 months.      [name] 02/24/2021, 2:36 PM   This note was created with Dragon medical transcription system.  Any errors from dictation are unintentional.

## 2021-02-24 NOTE — Assessment & Plan Note (Signed)
Patient was diagnosed with this after his Covid infection recently.  This may be in part the case, but I think there is also a component of Raynaud's disease and neuropathy contributing to his symptoms as well.  No significant PAD was present.

## 2021-02-24 NOTE — Assessment & Plan Note (Signed)
blood pressure control important in reducing the progression of atherosclerotic disease. On appropriate oral medications.  

## 2021-02-24 NOTE — Assessment & Plan Note (Signed)
Noninvasive studies were performed today showing normal triphasic waveforms and pressures bilaterally with a right ABI of 1.29 and left ABI of 1.20.  His digital pressures were normal as well even with the discoloration of the toes.  I think his lower extremity symptoms are multifactorial with a component of Raynaud's disease as the cold-induced spasm and worsening discoloration and pain is clearly present and gets better with warming.  There may be a component of Covid toe as well as neuropathy contributing to his lower extremity symptoms as well.  We discussed that I would not use any medication for Raynaud's disease at this point as utility would be limited for now.  I recommended avoidance of cold stimulation with trying to wear socks pretty much all the time and avoiding cold as best possible.  I have discussed the pathophysiology and natural history of Raynaud's disease.  No arterial intervention is required at this time.  I will plan to see him back in about 6 months.

## 2021-02-26 NOTE — Telephone Encounter (Signed)
Pharmacy has been updated in chart.

## 2021-02-27 MED ORDER — ZOLPIDEM TARTRATE ER 12.5 MG PO TBCR: 12.5000 mg | EXTENDED_RELEASE_TABLET | Freq: Every evening | ORAL | 5 refills | Status: DC | PRN

## 2021-02-27 NOTE — Telephone Encounter (Signed)
Patient came to check on his refill for zolpidem (AMBIEN CR) 12.5 MG CR tablet. New pharmacy is [facility] in [place].

## 2021-02-27 NOTE — Telephone Encounter (Signed)
Done

## 2021-03-02 ENCOUNTER — Other Ambulatory Visit: Payer: Self-pay | Admitting: Internal Medicine

## 2021-03-10 ENCOUNTER — Other Ambulatory Visit: Payer: Self-pay | Admitting: Internal Medicine

## 2021-03-10 DIAGNOSIS — R0602 Shortness of breath: Secondary | ICD-10-CM

## 2021-04-08 ENCOUNTER — Ambulatory Visit: Payer: 59 | Admitting: Internal Medicine

## 2021-04-17 ENCOUNTER — Other Ambulatory Visit: Payer: Self-pay | Admitting: Internal Medicine

## 2021-04-24 NOTE — Progress Notes (Signed)
Cardiology Office Note    Date:  04/29/2021   ID:  WOODROE VOGAN, DOB 1956/04/21, MRN [id number]  PCP:  [name], MD  Cardiologist:  Nelva Bush, MD  Electrophysiologist:  None   Chief Complaint: Follow-up  History of Present Illness:   Brian Atkins is a 65 y.o. male with history of nonobstructive CAD by [facility] in 11/2017 and coronary CTA in 11/2019, marginal zone B-cell lymphoma, restrictive lung disease, HTN, COVID-19 in 12/2019, peripheral neuropathy, and GERD status post Nissen fundoplication who presents for follow-up of  CAD.  He was evaluated in 11/2017 for chest pain, palpitations, headaches, and elevated BP.  He subsequently underwent LHC which revealed moderate nonobstructive CAD involving the proximal/mid LAD and mid RCA. Both lesions were not hemodynamically significant by fractional flow reserve(FFR of LAD 0.84, FFR RCA 0.94). LVEF greater than 65% with a moderately elevated LVEDP consistent with diastolic dysfunction. Medical therapy was recommended. Echo 12/29/17 showed an EF of 60-65%, normal wall motion, normal LV diastolic function, and a mildly dilated LA.  He was seen in 11/2019 with recurrent chest pain and underwent coronary CTA which showed a calcium score of 496 and was the 86th percentile for age and sex matched control.  He had mild diffuse nonobstructive CAD with calcified plaque noted in the left main, LAD, LCx, and RCA with a maximum identified stenosis of 25 to 49%.  Medical therapy was recommended.  He was diagnosed with Covid in 12/2019, and did not require hospital admission.  He was seen in 01/2020 continuing to note persistent exertional dyspnea, fatigue, and cough following his COVID-19 infection.  He reported two-pillow orthopnea and a 5 pound weight gain despite poor appetite since contracting Covid.  In this setting, he underwent echo in 01/2020, which showed a normal LVEF with grade 1 diastolic dysfunction and no significant valvular abnormalities.   He was evaluated by pulmonology in 02/2020 who felt like his symptoms were likely due to uncontrolled sleep apnea, GERD, and chronic rhinitis.  PFTs were recommended, and ultimately completed in 11/2020, which showed moderate restrictive lung disease with consideration for ILD.  He was seen by cardiology in 03/2020 noting multiple complaints including a several week history of numbness in his legs and feet that was most pronounced when lying down, which is followed by neurology.  He also noted exertional dyspnea that varied from one day to the next.  In the setting of lower extremity swelling, he had increased his Lasix to 40 mg daily and decreased his amlodipine with improvement in symptoms.  He was continued on Lasix 40 mg daily.  His leg pain was most consistent with a neuropathic process though given his risk factors he underwent lower extremity ABIs which were normal.  He was seen in 07/2020 reporting palpitations with ZIO monitor at that demonstrating sinus rhythm with rare PACs and PVCs with no sustained arrhythmia or prolonged pauses.  He was seen in 10/2020 with continued lower extremity swelling, amlodipine was subsequently discontinued in 10/2020 and compression stockings were advised.  He was last seen in the office in 11/2020 and continued to feel about the same with intermittent shortness of breath with frequent coughing.  Carvedilol was decreased to 12.5 mg twice daily in this setting.  No further cardiac testing was felt to be indicated.  With regards to his underlying dyspnea with history of abnormal PFTs, he is now followed by pulmonology.  He was evaluated by vascular surgery in 02/2021 for painful toes with noninvasive  lower extremity imaging again demonstrating normal ABIs bilaterally with symptoms felt to be related possibly to COVID toes as well as a component of Raynaud's and neuropathy contributing.  He comes in doing reasonably well from a cardiac perspective with stable symptoms.  His  underlying dyspnea is stable and now followed by pulmonology as outlined above.  He does continue to note mild dizziness without presyncope or syncope.  At times there seems to be some associated palpitations with this which feels similar to presentation leading to his above Zio patch.  Episodes would previously last 1 to 2 seconds in duration with current episodes lasting 4 to 5 seconds in duration.  He has checked both heart rate and BP during these episodes with BP noted to be around 120s over 80s with heart rates in the 90s bpm.  He does continue to note some dependent leg edema, particularly when he is on his feet for extended periods of time.  He is followed by GI at [facility] for GERD and chronic idiopathic constipation.    Labs independently reviewed: 01/2021 - potassium 4.8, BUN 19, serum creatinine 1.11, AST/ALT normal, albumin 4.2, TC 122, TG 78, HDL 47, LDL 60 09/2020 - Hgb 16.0, PLT 350, TSH normal    Past Medical History:  Diagnosis Date  . Complication of anesthesia    slow to wake  . Coronary artery disease, non-occlusive    a. LHC 12/18: ostLAD 20%, p/mLAD 60% FFR 0.84, ost ramus 50%, mRCA 50% FFR 0.94, EF 65%  . COVID-19 virus infection 12/28/2019  . Diverticulitis   . Diverticulosis   . Dyslipidemia   . Family history of adverse reaction to anesthesia    Mother - slow to wake  . GERD (gastroesophageal reflux disease)   . Hemorrhoids   . Hiatal hernia   . History of 2019 novel coronavirus disease (COVID-19) 12/24/2019  . History of echocardiogram    a. TTE 1/19: EF of 60-65%, normal wall motion, normal LV diastolic function, mildly dilated LA  . Labile hypertension   . Lymphoma ([place])   . Medial epicondylitis of right elbow   . Meralgia paresthetica of right side   . Microscopic hematuria   . Ocular migraine   . Sleep apnea    CPAP    Past Surgical History:  Procedure Laterality Date  . INTRAVASCULAR PRESSURE WIRE/FFR STUDY N/A 12/07/2017   Procedure: INTRAVASCULAR  PRESSURE WIRE/FFR STUDY;  Surgeon: Nelva Bush, MD;  Location: [facility];  Service: Cardiovascular;  Laterality: N/A;  . LEFT HEART CATH AND CORONARY ANGIOGRAPHY Left 12/07/2017   Procedure: LEFT HEART CATH AND CORONARY ANGIOGRAPHY;  Surgeon: Nelva Bush, MD;  Location: [facility];  Service: Cardiovascular;  Laterality: Left;  . nissen funduplication  [id number]   [name]  . SEPTOPLASTY Bilateral 08/23/2019   Procedure: SEPTOPLASTY;  Surgeon: [name], MD;  Location: [facility];  Service: ENT;  Laterality: Bilateral;  . SPLENECTOMY    . TURBINATE REDUCTION Bilateral 08/23/2019   Procedure: TURBINATE REDUCTION;  Surgeon: [name], MD;  Location: [facility];  Service: ENT;  Laterality: Bilateral;  Latex sensitivity sleep apnea    Current Medications: Current Meds  Medication Sig  . albuterol (VENTOLIN HFA) 108 (90 Base) MCG/ACT inhaler SMARTSIG:2 Puff(s) By Mouth Every 6 Hours PRN  . aspirin 325 MG tablet Take 325 mg by mouth daily.  . benzonatate (TESSALON) 200 MG capsule Take 1 capsule (200 mg total) by mouth 3 (three) times daily as needed  for cough.  . carvedilol (COREG) 12.5 MG tablet Take 1 tablet (12.5 mg total) by mouth 2 (two) times daily.  . cyanocobalamin (,VITAMIN B-12,) 1000 MCG/ML injection INJECT 1 ML INTO THE MUSCLE DAILY FOR 3 DAYS, THEN WEEKLY FOR 4 WEEKS, THEN MONTHLY THEREAFTER  . dexlansoprazole (DEXILANT) 60 MG capsule Take 60 mg by mouth 2 (two) times daily.  [name] doxycycline (VIBRA-TABS) 100 MG tablet Take 100 mg by mouth 2 (two) times daily.  [name] ezetimibe (ZETIA) 10 MG tablet TAKE 1 TABLET BY MOUTH EVERY DAY  . furosemide (LASIX) 40 MG tablet Take 1 tablet (40 mg total) by mouth daily as needed.  . Galcanezumab-gnlm (EMGALITY) 120 MG/ML SOAJ Inject 120 mg into the skin every 30 (thirty) days.  . hydrochlorothiazide (MICROZIDE) 12.5 MG capsule TAKE 1 CAPSULE BY MOUTH EVERY DAY  . losartan (COZAAR) 100 MG tablet TAKE 1  TABLET BY MOUTH EVERY DAY  . nitroGLYCERIN (NITROSTAT) 0.4 MG SL tablet Place 1 tablet (0.4 mg total) under the tongue every 5 (five) minutes as needed for chest pain. Maximum of 3 doses.  [name] omeprazole (PRILOSEC) 40 MG capsule Take 40 mg by mouth daily.  Glory Rosebush VERIO test strip USE TO CHECK BLOOD SUGAR AS NEEDED  . predniSONE (DELTASONE) 10 MG tablet Take by mouth.  . prochlorperazine (COMPAZINE) 5 MG tablet Take 5 mg by mouth every 6 (six) hours as needed for nausea or vomiting.  . Prucalopride Succinate (MOTEGRITY) 2 MG TABS Take 1 tablet by mouth daily.  [name] rOPINIRole (REQUIP) 0.25 MG tablet Take 0.25 mg by mouth in the morning and at bedtime.   . rosuvastatin (CRESTOR) 40 MG tablet TAKE 1 TABLET BY MOUTH EVERY DAY  . SYMBICORT 80-4.5 MCG/ACT inhaler Inhale 2 puffs into the lungs 2 (two) times daily.  . Syringe/Needle, Disp, (SYRINGE 3CC/25GX1") 25G X 1" 3 ML MISC Use for b12 injections  . zolpidem (AMBIEN CR) 12.5 MG CR tablet Take 1 tablet (12.5 mg total) by mouth at bedtime as needed for sleep.    Allergies:   Avelox [moxifloxacin], Contrast media [iodinated diagnostic agents], Crestor [rosuvastatin calcium], Morphine and related, Niacin-simvastatin er, Latex, and Tape   Social History   Socioeconomic History  . Marital status: Married    Spouse name: Not on file  . Number of children: 2  . Years of education: 41  . Highest education level: Not on file  Occupational History  . Occupation: Shop Printmaker: [employer]    Comment: [place]  Tobacco Use  . Smoking status: Never Smoker  . Smokeless tobacco: Never Used  Vaping Use  . Vaping Use: Never used  Substance and Sexual Activity  . Alcohol use: No  . Drug use: No  . Sexual activity: Yes  Other Topics Concern  . Not on file  Social History Narrative  . Not on file   Social Determinants of Health   Financial Resource Strain: Not on file  Food Insecurity: Not on file  Transportation Needs: Not on  file  Physical Activity: Not on file  Stress: Not on file  Social Connections: Not on file     Family History:  The patient's family history includes Aortic aneurysm (age of onset: 48) in his father; COPD in his mother; Cancer in his mother; Coronary artery disease (age of onset: 26) in his father; Hyperlipidemia in his father; Hypertension in his mother; Lung cancer in his mother; Stomach cancer in his maternal grandfather.  ROS:  Review of Systems  Constitutional: Positive for malaise/fatigue. Negative for chills, diaphoresis, fever and weight loss.  HENT: Negative for congestion.   Eyes: Negative for discharge and redness.  Respiratory: Positive for shortness of breath. Negative for cough, sputum production and wheezing.   Cardiovascular: Positive for palpitations and leg swelling. Negative for chest pain, orthopnea, claudication and PND.  Gastrointestinal: Positive for constipation and heartburn. Negative for abdominal pain, nausea and vomiting.       Abdominal bloating  Musculoskeletal: Negative for falls and myalgias.  Skin: Negative for rash.  Neurological: Positive for dizziness. Negative for tingling, tremors, sensory change, speech change, focal weakness, loss of consciousness and weakness.  Endo/Heme/Allergies: Does not bruise/bleed easily.  Psychiatric/Behavioral: Negative for substance abuse. The patient is not nervous/anxious.   All other systems reviewed and are negative.    EKGs/Labs/Other Studies Reviewed:    Studies reviewed were summarized above. The additional studies were reviewed today: As above.  EKG:  EKG is ordered today.  The EKG ordered today demonstrates NSR, 71 bpm, no acute ST-T changes  Recent Labs: 09/23/2020: TSH 2.18 01/30/2021: ALT 21; BUN 19; Creatinine, Ser 1.11; Potassium 4.8; Sodium 142  Recent Lipid Panel    Component Value Date/Time   CHOL 122 01/30/2021 1003   TRIG 78.0 01/30/2021 1003   HDL 47.10 01/30/2021 1003   CHOLHDL 3  01/30/2021 1003   VLDL 15.6 01/30/2021 1003   LDLCALC 60 01/30/2021 1003   LDLCALC 162 (H) 10/24/2020 1608   LDLDIRECT 98.0 03/10/2020 1551    PHYSICAL EXAM:    VS:  BP 120/88 (BP Location: Left Arm, Patient Position: Sitting, Cuff Size: Normal)   Pulse 71   Ht 6\' 2"  (1.88 m)   Wt 220 lb (99.8 kg)   SpO2 94%   BMI 28.25 kg/m   BMI: Body mass index is 28.25 kg/m.  Physical Exam Vitals reviewed.  Constitutional:      Appearance: He is well-developed.  HENT:     Head: Normocephalic and atraumatic.  Eyes:     General:        Right eye: No discharge.        Left eye: No discharge.  Neck:     Vascular: No JVD.  Cardiovascular:     Rate and Rhythm: Normal rate and regular rhythm.     Pulses: No midsystolic click and no opening snap.          Posterior tibial pulses are 2+ on the right side and 2+ on the left side.     Heart sounds: Normal heart sounds, S1 normal and S2 normal. Heart sounds not distant. No murmur heard. No friction rub.  Pulmonary:     Effort: Pulmonary effort is normal. No respiratory distress.     Breath sounds: Normal breath sounds. No decreased breath sounds, wheezing or rales.  Chest:     Chest wall: No tenderness.  Abdominal:     General: There is no distension.     Palpations: Abdomen is soft.     Tenderness: There is no abdominal tenderness.  Musculoskeletal:     Cervical back: Normal range of motion.     Right lower leg: Edema present.     Left lower leg: Edema present.     Comments: Trace bilateral pretibial edema  Skin:    General: Skin is warm and dry.     Nails: There is no clubbing.  Neurological:     Mental Status: He is alert and oriented to person, place,  and time.  Psychiatric:        Speech: Speech normal.        Behavior: Behavior normal.        Thought Content: Thought content normal.        Judgment: Judgment normal.     Wt Readings from Last 3 Encounters:  04/29/21 220 lb (99.8 kg)  02/24/21 222 lb (100.7 kg)  01/30/21  217 lb 9.6 oz (98.7 kg)     ASSESSMENT & PLAN:   1. Nonobstructive CAD: Symptoms appear to be stable to well controlled at this time.  Given nonobstructive CAD by [facility] in 2018 and stable coronary anatomy by coronary CTA in 11/2019 no further cardiac testing or intervention is indicated at this time.  Continue current medical therapy and risk factor modification.  2. Shortness of breath: PFTs consistent with moderate restrictive lung disease and possible ILD.  Cardiac work-up has been unrevealing including invasive and noninvasive ischemic testing, structural assessment, and outpatient cardiac monitoring.  No plans for further cardiac testing.  3. HTN: Blood pressure is reasonably controlled in the office today.  No changes in therapy.  4. HLD: LDL 60 in 01/2021 with normal LFT at that time.  He remains on rosuvastatin and ezetimibe.  5. Lower extremity edema: Felt to be dependent in the setting of venous insufficiency.  He remains on low-dose HCTZ with as needed furosemide.  Recommend leg elevation and compression stockings in an effort to minimize loop diuretic usage.  6. Dizziness/palpitations: Prior work-up unrevealing.  Home vital signs stable during episodes.  Given brief mild symptoms he prefers to defer repeat outpatient cardiac monitoring at this time.  He will let us know if his symptoms worsen.    Disposition: F/u with [name] or an APP in 6 months.   Medication Adjustments/Labs and Tests Ordered: Current medicines are reviewed at length with the patient today.  Concerns regarding medicines are outlined above. Medication changes, Labs and Tests ordered today are summarized above and listed in the Patient Instructions accessible in Encounters.   Signed, [name], PA-C 04/29/2021 5:08 PM     [address] [place], [place] [address] [phone]

## 2021-04-26 ENCOUNTER — Other Ambulatory Visit: Payer: Self-pay | Admitting: Internal Medicine

## 2021-04-29 ENCOUNTER — Encounter: Payer: Self-pay | Admitting: Physician Assistant

## 2021-04-29 ENCOUNTER — Other Ambulatory Visit: Payer: Self-pay

## 2021-04-29 ENCOUNTER — Ambulatory Visit: Payer: 59 | Admitting: Physician Assistant

## 2021-04-29 VITALS — BP 120/88 | HR 71 | Ht 74.0 in | Wt 220.0 lb

## 2021-04-29 DIAGNOSIS — I2510 Atherosclerotic heart disease of native coronary artery without angina pectoris: Secondary | ICD-10-CM | POA: Diagnosis not present

## 2021-04-29 DIAGNOSIS — I10 Essential (primary) hypertension: Secondary | ICD-10-CM

## 2021-04-29 DIAGNOSIS — J984 Other disorders of lung: Secondary | ICD-10-CM

## 2021-04-29 DIAGNOSIS — R0602 Shortness of breath: Secondary | ICD-10-CM

## 2021-04-29 DIAGNOSIS — R42 Dizziness and giddiness: Secondary | ICD-10-CM

## 2021-04-29 DIAGNOSIS — R600 Localized edema: Secondary | ICD-10-CM

## 2021-04-29 DIAGNOSIS — E785 Hyperlipidemia, unspecified: Secondary | ICD-10-CM

## 2021-04-29 DIAGNOSIS — R002 Palpitations: Secondary | ICD-10-CM

## 2021-04-29 NOTE — Patient Instructions (Signed)
Medication Instructions:  No changes at this time.  *If you need a refill on your cardiac medications before your next appointment, please call your pharmacy*   Lab Work: None  If you have labs (blood work) drawn today and your tests are completely normal, you will receive your results only by: [name] MyChart Message (if you have MyChart) OR . A paper copy in the mail If you have any lab test that is abnormal or we need to change your treatment, we will call you to review the results.   Testing/Procedures: None   Follow-Up: At [facility], you and your health needs are our priority.  As part of our continuing mission to provide you with exceptional heart care, we have created designated Provider Care Teams.  These Care Teams include your primary Cardiologist (physician) and Advanced Practice Providers (APPs -  Physician Assistants and Nurse Practitioners) who all work together to provide you with the care you need, when you need it.   Your next appointment:   6 month(s)  The format for your next appointment:   In Person  Provider:   You may see Nelva Bush, MD or one of the following Advanced Practice Providers on your designated Care Team:    Murray Hodgkins, NP  [name], PA-C  Marrianne Mood, PA-C  Cadence [place], [place]  Laurann Montana, NP

## 2021-05-21 ENCOUNTER — Other Ambulatory Visit: Payer: Self-pay | Admitting: Physician Assistant

## 2021-06-21 ENCOUNTER — Other Ambulatory Visit: Payer: Self-pay | Admitting: Internal Medicine

## 2021-07-14 ENCOUNTER — Other Ambulatory Visit: Payer: Self-pay | Admitting: Internal Medicine

## 2021-07-19 ENCOUNTER — Other Ambulatory Visit: Payer: Self-pay | Admitting: Internal Medicine

## 2021-07-20 ENCOUNTER — Telehealth: Payer: Self-pay

## 2021-07-20 NOTE — Telephone Encounter (Signed)
Is it okay to schedule for labs and an in office appt?

## 2021-07-20 NOTE — Telephone Encounter (Signed)
Pt's wife Vicente Males called and states that pt has had a cough and congestion in his chest for over a month. She states that she called to get him an appt over a month ago and we would not let him come into the office due to symptoms. She states that he went to [facility] walk-in today and they did a covid test and it was negative. They suggested that he see his PCP for comprehensive labs since he has had cancer in the past. Please advise on what Dr Derrel Nip would like to do. Please call pt's wife back at [phone] because pt may be sleeping.

## 2021-07-21 NOTE — Telephone Encounter (Signed)
LMTCB with pt's wife to see if pt would be willing to come in for an appt on 07/28/2021 at 12:30. Okay per Dr. Derrel Nip.

## 2021-07-21 NOTE — Telephone Encounter (Signed)
No appts available until beginning of September is there some where you would like to work him in?

## 2021-07-21 NOTE — Telephone Encounter (Signed)
Spoke with pt's wife and she is okay with scheduling the appt for 07/28/2021 at 12:30pm. She stated that the pt has already had a chest xray done on 07/08/2021 by [facility]. The pt's wife has given permission for [place] to request the results(form was given to [facility]). However pt's wife would like for pt to have some lab work done as well before his appt on 07/28/2021. She stated that when pt went to walk in at [facility] yesterday they stated that they do hear something in his lungs and put him on two antibiotics and prednisone but told him that he needed to follow up with PCP to have blood work done as soon as possible.

## 2021-07-22 ENCOUNTER — Ambulatory Visit: Payer: 59 | Admitting: Internal Medicine

## 2021-07-22 ENCOUNTER — Other Ambulatory Visit: Payer: Self-pay

## 2021-07-22 ENCOUNTER — Telehealth: Payer: Self-pay | Admitting: Internal Medicine

## 2021-07-22 VITALS — BP 118/70 | HR 70 | Temp 98.2°F | Ht 74.0 in | Wt 211.2 lb

## 2021-07-22 DIAGNOSIS — E785 Hyperlipidemia, unspecified: Secondary | ICD-10-CM

## 2021-07-22 DIAGNOSIS — J470 Bronchiectasis with acute lower respiratory infection: Secondary | ICD-10-CM | POA: Diagnosis not present

## 2021-07-22 DIAGNOSIS — I10 Essential (primary) hypertension: Secondary | ICD-10-CM | POA: Diagnosis not present

## 2021-07-22 DIAGNOSIS — J329 Chronic sinusitis, unspecified: Secondary | ICD-10-CM

## 2021-07-22 LAB — COMPREHENSIVE METABOLIC PANEL
ALT: 26 U/L (ref 0–53)
AST: 19 U/L (ref 0–37)
Albumin: 4.3 g/dL (ref 3.5–5.2)
Alkaline Phosphatase: 76 U/L (ref 39–117)
BUN: 17 mg/dL (ref 6–23)
CO2: 28 mEq/L (ref 19–32)
Calcium: 9.8 mg/dL (ref 8.4–10.5)
Chloride: 100 mEq/L (ref 96–112)
Creatinine, Ser: 1.07 mg/dL (ref 0.40–1.50)
GFR: 73.14 mL/min (ref >60.00)
Glucose, Bld: 107 mg/dL — ABNORMAL HIGH (ref 70–99)
Potassium: 4.6 mEq/L (ref 3.5–5.1)
Sodium: 139 mEq/L (ref 135–145)
Total Bilirubin: 0.4 mg/dL (ref 0.2–1.2)
Total Protein: 7.5 g/dL (ref 6.0–8.3)

## 2021-07-22 LAB — CBC WITH DIFFERENTIAL/PLATELET
Basophils Absolute: 0 10*3/uL (ref 0.0–0.1)
Basophils Relative: 0.4 % (ref 0.0–3.0)
Eosinophils Absolute: 0.1 10*3/uL (ref 0.0–0.7)
Eosinophils Relative: 1.3 % (ref 0.0–5.0)
HCT: 48.2 % (ref 39.0–52.0)
Hemoglobin: 16.1 g/dL (ref 13.0–17.0)
Lymphocytes Relative: 19.7 % (ref 12.0–46.0)
Lymphs Abs: 2.1 10*3/uL (ref 0.7–4.0)
MCHC: 33.3 g/dL (ref 30.0–36.0)
MCV: 92.6 fl (ref 78.0–100.0)
Monocytes Absolute: 0.8 10*3/uL (ref 0.1–1.0)
Monocytes Relative: 7.4 % (ref 3.0–12.0)
Neutro Abs: 7.5 10*3/uL (ref 1.4–7.7)
Neutrophils Relative %: 71.2 % (ref 43.0–77.0)
Platelets: 347 10*3/uL (ref 150.0–400.0)
RBC: 5.21 Mil/uL (ref 4.22–5.81)
RDW: 14.8 % (ref 11.5–15.5)
WBC: 10.5 10*3/uL (ref 4.0–10.5)

## 2021-07-22 LAB — LIPID PANEL
Cholesterol: 147 mg/dL (ref 0–200)
HDL: 47.4 mg/dL (ref >39.00)
LDL Cholesterol: 77 mg/dL (ref 0–99)
NonHDL: 99.69
Total CHOL/HDL Ratio: 3
Triglycerides: 112 mg/dL (ref 0.0–149.0)
VLDL: 22.4 mg/dL (ref 0.0–40.0)

## 2021-07-22 MED ORDER — PREDNISONE 10 MG PO TABS: ORAL_TABLET | ORAL | 0 refills | Status: DC

## 2021-07-22 MED ORDER — HYDROCOD POLST-CPM POLST ER 10-8 MG/5ML PO SUER: 5.0000 mL | Freq: Every evening | ORAL | 0 refills | Status: DC | PRN

## 2021-07-22 NOTE — Patient Instructions (Signed)
Continue the omnicef and doxycycline.    Prednisone  taper;  40 mg daily for 2 days,  then 30 mg daily for 2 days,  then 20 mg x 2 days ,  then 10 mg daily for 2 days  then sto   Tussionex cough syrup INSTEAD OF SLEEPING PILL.  Take dose 45 minutes before bedtime   Once you finish tussionex start taking 25 mg benadryl at night time only.   Talk to [facility] about the bronchiectasis noted on chest CT

## 2021-07-22 NOTE — Progress Notes (Signed)
Pre visit review using our clinic review tool, if applicable. No additional management support is needed unless otherwise documented below in the visit note. 

## 2021-07-22 NOTE — Telephone Encounter (Signed)
Spoken to patient wife and appointment has been moved up to today per cancellation.

## 2021-07-22 NOTE — Progress Notes (Signed)
Subjective:  Patient ID: Brian Atkins, male    DOB: 1956-05-12  Age: 65 y.o. MRN: [id number]  CC: The primary encounter diagnosis was Chronic sinusitis, unspecified location. Diagnoses of Hyperlipidemia LDL goal <70, Essential hypertension, and Bronchiectasis with acute lower respiratory infection ([place]) were also pertinent to this visit.  HPI Brian Atkins presents for follow up on persistent cough  This visit occurred during the SARS-CoV-2 public health emergency.  Safety protocols were in place, including screening questions prior to the visit, additional usage of staff PPE, and extensive cleaning of exam room while observing appropriate contact time as indicated for disinfecting solutions.   65 yr old male with history of MALT lymphoma, previous COVID infection   bronchiectasis and bilateral lowre lobe pleural scarring (?) by Feb 2022 CT chest presents with 6   week history of productive cough.  Treated by  ENT Juengel rist time with augmentin for one week.  Did not resolve.  Took a second round of abx  in mid July , also prescribed by [facility],  and was still on t2nd round of abx when he saw Pulmonology Brian Atkins who added prednisone and ordered x ray same day.  Continues to feel poorly , saw  [facility] Atkins on August 1;  chest x ray done (no report) ;  treated for chronic sinusitis with Omnicef 300 mg bid plus doxycyclie (identiied by call to pharmacy) for  14 days.   Symptoms are purulent cough and chest tightness denies purulent nasal discharge and facial pain  no fevers.  T at urgetn Atkins was 99.9 .  He is miserable,   not sleeping because of the copius amounts of post nasal drip causing cough.  Tessalon perles have not suppressed cough.  Has GERD, sleeps on a wedge. Brian Atkins Has multiple PPIs  listed in in chart , but he is not sure which one he is taking currently  .  ENT evaluation of sinuses  did not find anything but "tight passage" on the right.   Taking albuterol and Trilegy per Brian Atkins,  but only for the last 3 days .   Outpatient Medications Prior to Visit  Medication Sig Dispense Refill   albuterol (VENTOLIN HFA) 108 (90 Base) MCG/ACT inhaler SMARTSIG:2 Puff(s) By Mouth Every 6 Hours PRN     aspirin 325 MG tablet Take 325 mg by mouth daily.     Atogepant (QULIPTA) 60 MG TABS Take 1 tablet by mouth daily.     butalbital-acetaminophen-caffeine (FIORICET) 50-325-40 MG tablet Take by mouth.     carvedilol (COREG) 12.5 MG tablet Take 1 tablet (12.5 mg total) by mouth 2 (two) times daily. 180 tablet 3   cyanocobalamin (,VITAMIN B-12,) 1000 MCG/ML injection INJECT 1 ML INTO THE MUSCLE DAILY FOR 3 DAYS, THEN WEEKLY FOR 4 WEEKS, THEN MONTHLY THEREAFTER 20 mL 0   dexlansoprazole (DEXILANT) 60 MG capsule Take 60 mg by mouth 2 (two) times daily.     doxycycline (VIBRA-TABS) 100 MG tablet Take 100 mg by mouth 2 (two) times daily.     ezetimibe (ZETIA) 10 MG tablet TAKE 1 TABLET BY MOUTH EVERY DAY 90 tablet 1   Galcanezumab-gnlm (EMGALITY) 120 MG/ML SOAJ Inject 120 mg into the skin every 30 (thirty) days.     hydrochlorothiazide (MICROZIDE) 12.5 MG capsule TAKE ONE CAPSULE BY MOUTH ONE TIME DAILY 90 capsule 0   losartan (COZAAR) 100 MG tablet TAKE 1 TABLET BY MOUTH EVERY DAY 90 tablet 3   omeprazole (PRILOSEC) 40 MG  capsule Take 40 mg by mouth daily.     ONETOUCH VERIO test strip USE TO CHECK BLOOD SUGAR AS NEEDED 100 strip 2   prochlorperazine (COMPAZINE) 5 MG tablet Take 5 mg by mouth every 6 (six) hours as needed for nausea or vomiting.     Prucalopride Succinate (MOTEGRITY) 2 MG TABS Take 1 tablet by mouth daily.     rOPINIRole (REQUIP) 0.25 MG tablet Take 0.25 mg by mouth in the morning and at bedtime.      rosuvastatin (CRESTOR) 40 MG tablet TAKE 1 TABLET BY MOUTH EVERY DAY 90 tablet 1   SYMBICORT 80-4.5 MCG/ACT inhaler Inhale 2 puffs into the lungs 2 (two) times daily. 1 Inhaler 5   Syringe/Needle, Disp, (SYRINGE 3CC/25GX1") 25G X 1" 3 ML MISC Use for b12 injections 50 each 0    zolpidem (AMBIEN CR) 12.5 MG CR tablet Take 1 tablet (12.5 mg total) by mouth at bedtime as needed for sleep. 30 tablet 5   benzonatate (TESSALON) 200 MG capsule Take 1 capsule (200 mg total) by mouth 3 (three) times daily as needed for cough. 60 capsule 1   predniSONE (DELTASONE) 10 MG tablet Take by mouth.     furosemide (LASIX) 40 MG tablet Take 1 tablet (40 mg total) by mouth daily as needed.     nitroGLYCERIN (NITROSTAT) 0.4 MG SL tablet Place 1 tablet (0.4 mg total) under the tongue every 5 (five) minutes as needed for chest pain. Maximum of 3 doses. 35 tablet 3   No facility-administered medications prior to visit.    Review of Systems;  Patient denies headache, fevers, , unintentional weight loss, skin rash, eye pain, sinus congestion and sinus pain, sore throat, dysphagia,  hemoptysis ,  chest pain, palpitations, orthopnea, edema, abdominal pain, nausea, melena, diarrhea, constipation, flank pain, dysuria, hematuria, urinary  Frequency, nocturia, numbness, tingling, seizures,  Focal weakness, Loss of consciousness,  Tremor, insomnia, depression, anxiety, and suicidal ideation.      Objective:  BP 118/70   Pulse 70   Temp 98.2 F (36.8 C) (Oral)   Ht '6\' 2"'$  (1.88 m)   Wt 211 lb 3.2 oz (95.8 kg)   SpO2 94%   BMI 27.12 kg/m   BP Readings from Last 3 Encounters:  07/22/21 118/70  04/29/21 120/88  02/24/21 128/80    Wt Readings from Last 3 Encounters:  07/22/21 211 lb 3.2 oz (95.8 kg)  04/29/21 220 lb (99.8 kg)  02/24/21 222 lb (100.7 kg)    General appearance: alert, cooperative and appears stated age.  Appears tired.  Ears: normal TM's and external ear canals both ears Throat: lips, mucosa, and tongue normal; teeth and gums normal Neck: no adenopathy, no carotid bruit, supple, symmetrical, trachea midline and thyroid not enlarged, symmetric, no tenderness/mass/nodules Back: symmetric, no curvature. ROM normal. No CVA tenderness. Lungs: clear to auscultation  bilaterally Heart: regular rate and rhythm, S1, S2 normal, no murmur, click, rub or gallop Abdomen: soft, non-tender; bowel sounds normal; no masses,  no organomegaly Pulses: 2+ and symmetric Skin: Skin color, texture, turgor normal. No rashes or lesions Lymph nodes: Cervical, supraclavicular, and axillary nodes normal.  Lab Results  Component Value Date   HGBA1C 6.0 09/23/2020   HGBA1C 5.8 03/10/2020   HGBA1C 5.9 03/13/2019    Lab Results  Component Value Date   CREATININE 1.11 01/30/2021   CREATININE 1.06 11/07/2020   CREATININE 1.18 10/24/2020    Lab Results  Component Value Date   WBC 8.2 04/15/2020  HGB 16.1 04/15/2020   HCT 47.1 04/15/2020   PLT 324 04/15/2020   GLUCOSE 77 01/30/2021   CHOL 122 01/30/2021   TRIG 78.0 01/30/2021   HDL 47.10 01/30/2021   LDLDIRECT 98.0 03/10/2020   LDLCALC 60 01/30/2021   ALT 21 01/30/2021   AST 17 01/30/2021   NA 142 01/30/2021   K 4.8 01/30/2021   CL 104 01/30/2021   CREATININE 1.11 01/30/2021   BUN 19 01/30/2021   CO2 32 01/30/2021   TSH 2.18 09/23/2020   PSA 3.11 10/24/2020   INR 1.0 11/30/2017   HGBA1C 6.0 09/23/2020   MICROALBUR 0.5 10/24/2020    CT Chest Wo Contrast  Result Date: 02/08/2021 CLINICAL DATA:  Interstitial lung disease Pneumonia, effusion or abscess suspected, xray done Dyspnea, chronic, unclear etiology COVID-19 in March 2020 and November 2021. EXAM: CT CHEST WITHOUT CONTRAST TECHNIQUE: Multidetector CT imaging of the chest was performed following the standard protocol without IV contrast. COMPARISON:  Chest radiograph 11/07/2020, included portions from cardiac CT 11/07/2019, and abdominal CT 05/03/2014 FINDINGS: Cardiovascular: Heart is normal in size. There are coronary artery calcifications. Mild aortic atherosclerosis. No aortic aneurysm. No pericardial effusion. Mediastinum/Nodes: No enlarged mediastinal or axillary lymph nodes. No bulky hilar adenopathy, detailed hilar assessment is limited on this  noncontrast exam. No visualized thyroid nodule. Small to moderate hiatal hernia. Surgical clips at the gastroesophageal junction, patient is post Nissen procedure. Lungs/Pleura: Bandlike and linear opacities in the dependent left lower lobe with minimal peripheral pleural thickening consistent with scarring. Linear bandlike opacity in the dependent right lower lobe may represent atelectasis or scarring. Mild left lower lobe bronchiectasis. No evidence of pneumonia, no ground-glass opacity typically seen with COVID-19. 3 mm left upper lobe pulmonary nodule, series 3, image 80, not previously included in the field of view. Punctate right lower lobe nodule in the lower right lower lobe, series 3, image 147, appears calcified and consistent with granuloma. No honeycombing or reticulation. Trachea and central bronchi are patent. No pleural fluid. Upper Abdomen: Low-density lesion in the dome of the liver measuring 2.7 cm most consistent with cyst. This has slightly increased in size from prior exam. Splenectomy. No upper abdominal adenopathy. Musculoskeletal: There are no acute or suspicious osseous abnormalities. IMPRESSION: 1. Bandlike and linear opacities in the dependent left lower lobe with minimal peripheral pleural thickening consistent with scarring. Linear bandlike opacity in the dependent right lower lobe may represent atelectasis or scarring. 2. Mild left lower lobe bronchiectasis. No other findings to suggest interstitial lung disease. If there is persistent clinical concern for interstitial lung disease, high-resolution chest CT could be considered. 3. A 3 mm left upper lobe pulmonary nodule, not previously included in the field of view. No follow-up needed if patient is low-risk. Non-contrast chest CT can be considered in 12 months if patient is high-risk. This recommendation follows the consensus statement: Guidelines for Management of Incidental Pulmonary Nodules Detected on CT Images: From the Fleischner  Society 2017; Radiology 2017; 284:228-243. There is also a benign calcified granuloma at the right lung base. 4. Coronary artery calcifications. 5. Small hiatal hernia, post Nissen. Aortic Atherosclerosis (ICD10-I70.0). Electronically Signed   By: Keith Rake M.D.   On: 02/08/2021 12:10    Assessment & Plan:   Problem List Items Addressed This Visit       Unprioritized   Hyperlipidemia LDL goal <70   Relevant Orders   Lipid panel   Essential hypertension   Relevant Orders   Comprehensive metabolic panel  Bronchiectasis ([place])    He has been having purulent sputum for over 5  weeks, despite 2 rounds of antibiotics and is currently on his third round, which was prescribed by Dionicio Stall ;cefdinir and doxycycline.   (all 3 rounds prescribed for sinusitis).   He is not sleeping due to a cough of multiple etiologies (GERD, bronchiectasis) . He is elevating head of bed.    Recommend stopping the antibiotics and controlling cough with antihistamine , PPI and cough suppressant (tussionex)        Other Visit Diagnoses     Chronic sinusitis, unspecified location    -  Primary   Relevant Medications   predniSONE (DELTASONE) 10 MG tablet   chlorpheniramine-HYDROcodone (TUSSIONEX PENNKINETIC ER) 10-8 MG/5ML SUER   Other Relevant Orders   CBC with Differential/Platelet       Meds ordered this encounter  Medications   predniSONE (DELTASONE) 10 MG tablet    Sig: 4 tablets all at once on Day 1,  Then taper by 1 tablet every 2 days daily until gone    Dispense:  20 tablet    Refill:  0   chlorpheniramine-HYDROcodone (TUSSIONEX PENNKINETIC ER) 10-8 MG/5ML SUER    Sig: Take 5 mLs by mouth at bedtime as needed.    Dispense:  180 mL    Refill:  0    Medications Discontinued During This Encounter  Medication Reason   predniSONE (DELTASONE) 10 MG tablet Reorder   A total of 40 minutes was spent with patient , including reviewing Feb 2022 Chest CT,  discussing the diagnosis of  bronchiectasis  reviewing recent evaluations by other providers, counseling patient on the above mentioned issue , reviewing and explaining recent labs and imaging studies done, and coordination of Atkins.   Follow-up: No follow-ups on file.   [name], MD

## 2021-07-22 NOTE — Assessment & Plan Note (Signed)
He has been having purulent sputum for over 5  weeks, despite 2 rounds of antibiotics and is currently on his third round, which was prescribed by Dionicio Stall ;cefdinir and doxycycline.   (all 3 rounds prescribed for sinusitis).   He is not sleeping due to a cough of multiple etiologies (GERD, bronchiectasis) . He is elevating head of bed.    Recommend stopping the antibiotics and controlling cough with antihistamine , PPI and cough suppressant (tussionex)

## 2021-07-22 NOTE — Assessment & Plan Note (Deleted)
He has been having purulent sputum for several weeks, despite 2 rounds of antibiotics and ix currently on his third,  (all prescribed for sinusitis). He is not sleeping due to a cough of multiple etiologies (GERD, bronchiectasis) . He is elevating head of bed.    Recommend stopping the antibiotics and controlling cough with antihistamine , PPI and cough suppressant (tussionex)

## 2021-07-28 ENCOUNTER — Ambulatory Visit: Payer: 59 | Admitting: Internal Medicine

## 2021-08-16 ENCOUNTER — Other Ambulatory Visit: Payer: Self-pay | Admitting: Internal Medicine

## 2021-08-17 NOTE — Telephone Encounter (Signed)
RX Refill:ambien Last Seen:07-22-21 Last ordered:02-27-21

## 2021-08-19 ENCOUNTER — Other Ambulatory Visit
Admission: RE | Admit: 2021-08-19 | Discharge: 2021-08-19 | Disposition: A | Discharge status: Home / Self Care | Payer: 59 | Source: Ambulatory Visit | Attending: Pulmonary Disease | Admitting: Pulmonary Disease

## 2021-08-19 ENCOUNTER — Other Ambulatory Visit: Payer: Self-pay

## 2021-08-19 DIAGNOSIS — Z01818 Encounter for other preprocedural examination: Secondary | ICD-10-CM | POA: Diagnosis not present

## 2021-08-19 DIAGNOSIS — Z20822 Contact with and (suspected) exposure to covid-19: Secondary | ICD-10-CM | POA: Diagnosis not present

## 2021-08-19 HISTORY — DX: Personal history of urinary calculi: Z87.442

## 2021-08-19 HISTORY — DX: Pneumonia, unspecified organism: J18.9

## 2021-08-19 HISTORY — DX: Dyspnea, unspecified: R06.00

## 2021-08-19 LAB — APTT: aPTT: 33 seconds (ref 24–36)

## 2021-08-19 LAB — CBC
HCT: 46.1 % (ref 39.0–52.0)
Hemoglobin: 15.7 g/dL (ref 13.0–17.0)
MCH: 30.7 pg (ref 26.0–34.0)
MCHC: 34.1 g/dL (ref 30.0–36.0)
MCV: 90.2 fL (ref 80.0–100.0)
Platelets: 383 10*3/uL (ref 150–400)
RBC: 5.11 MIL/uL (ref 4.22–5.81)
RDW: 14.6 % (ref 11.5–15.5)
WBC: 7.6 10*3/uL (ref 4.0–10.5)
nRBC: 0 % (ref 0.0–0.2)

## 2021-08-19 LAB — PROTIME-INR
INR: 1 (ref 0.8–1.2)
Prothrombin Time: 13 seconds (ref 11.4–15.2)

## 2021-08-19 LAB — SARS CORONAVIRUS 2 (TAT 6-24 HRS): SARS Coronavirus 2: NEGATIVE

## 2021-08-19 NOTE — Patient Instructions (Addendum)
Your procedure is scheduled on: 08/21/21 Report to the Registration Desk on the 1st floor of the [facility]. To find out your arrival time, please call [phone] between 1PM - 3PM on: 08/20/21   REMEMBER: Instructions that are not followed completely may result in serious medical risk, up to and including death; or upon the discretion of your surgeon and anesthesiologist your surgery may need to be rescheduled.  Do not eat food after midnight the night before surgery.  No gum chewing, lozengers or hard candies.  You may however, drink CLEAR liquids up to 2 hours before you are scheduled to arrive for your surgery. Do not drink anything within 2 hours of your scheduled arrival time.  Clear liquids include: - water  - apple juice without pulp - gatorade (not RED, PURPLE, OR BLUE) - black coffee or tea (Do NOT add milk or creamers to the coffee or tea) Do NOT drink anything that is not on this list.  TAKE THESE MEDICATIONS THE MORNING OF SURGERY WITH A SIP OF WATER:  - amLODipine (NORVASC) 5 MG tablet - carvedilol (COREG) 12.5 MG tablet - famotidine (PEPCID) 20 MG tablet, take one the night before and one on the morning of surgery - helps to prevent nausea after surgery. - Fluticasone-Umeclidin-Vilant (TRELEGY ELLIPTA) 100-62.5-25 MCG/INH AEPB - rOPINIRole (REQUIP) 0.25 MG tablet  Use  albuterol (VENTOLIN HFA) 108 (90 Base) MCG/ACT inhaleron the day of surgery and bring to the hospital.  Follow recommendations from Cardiologist, Pulmonologist or PCP regarding stopping Aspirin, Coumadin, Plavix, Eliquis, Pradaxa, or Pletal. Stop taking 08/19/21.  One week prior to surgery: Stop Anti-inflammatories (NSAIDS) such as Advil, Aleve, Ibuprofen, Motrin, Naproxen, Naprosyn and Aspirin based products such as Excedrin, Goodys Powder, BC Powder.  Stop ANY OVER THE COUNTER supplements until after surgery. Stop multivitamin 08/19/21.  You may however, continue to take Tylenol if needed for  pain up until the day of surgery.  No Alcohol for 24 hours before or after surgery.  No Smoking including e-cigarettes for 24 hours prior to surgery.  No chewable tobacco products for at least 6 hours prior to surgery.  No nicotine patches on the day of surgery.  Do not use any "recreational" drugs for at least a week prior to your surgery.  Please be advised that the combination of cocaine and anesthesia may have negative outcomes, up to and including death. If you test positive for cocaine, your surgery will be cancelled.  On the morning of surgery brush your teeth with toothpaste and water, you may rinse your mouth with mouthwash if you wish. Do not swallow any toothpaste or mouthwash.  Do not wear jewelry, make-up, hairpins, clips or nail polish.  Do not wear lotions, powders, or perfumes.   Do not shave body from the neck down 48 hours prior to surgery just in case you cut yourself which could leave a site for infection.  Also, freshly shaved skin may become irritated if using the CHG soap.  Contact lenses, hearing aids and dentures may not be worn into surgery.  Do not bring valuables to the hospital. [facility] is not responsible for any missing/lost belongings or valuables.   Notify your doctor if there is any change in your medical condition (cold, fever, infection).  Wear comfortable clothing (specific to your surgery type) to the hospital.  After surgery, you can help prevent lung complications by doing breathing exercises.  Take deep breaths and cough every 1-2 hours. Your doctor may order a device  called an Chiropodist to help you take deep breaths. When coughing or sneezing, hold a pillow firmly against your incision with both hands. This is called "splinting." Doing this helps protect your incision. It also decreases belly discomfort.  If you are being admitted to the hospital overnight, leave your suitcase in the car. After surgery it may be brought to  your room.  If you are being discharged the day of surgery, you will not be allowed to drive home. You will need a responsible adult (18 years or older) to drive you home and stay with you that night.   If you are taking public transportation, you will need to have a responsible adult (18 years or older) with you. Please confirm with your physician that it is acceptable to use public transportation.   Please call the Day Dept. at [phone] if you have any questions about these instructions.  Surgery Visitation Policy:  Patients undergoing a surgery or procedure may have one family member or support person with them as long as that person is not COVID-19 positive or experiencing its symptoms.  That person may remain in the waiting area during the procedure.  Inpatient Visitation:    Visiting hours are 7 a.m. to 8 p.m. Inpatients will be allowed two visitors daily. The visitors may change each day during the patient's stay. No visitors under the age of 8. Any visitor under the age of 25 must be accompanied by an adult. The visitor must pass COVID-19 screenings, use hand sanitizer when entering and exiting the patient's room and wear a mask at all times, including in the patient's room. Patients must also wear a mask when staff or their visitor are in the room. Masking is required regardless of vaccination status.

## 2021-08-20 MED ORDER — LACTATED RINGERS IV SOLN: INTRAVENOUS | Status: DC

## 2021-08-20 MED ORDER — ORAL CARE MOUTH RINSE: 15.0000 mL | Freq: Once | OROMUCOSAL | Status: AC

## 2021-08-20 MED ORDER — CHLORHEXIDINE GLUCONATE 0.12 % MT SOLN: 15.0000 mL | Freq: Once | OROMUCOSAL | Status: AC

## 2021-08-21 ENCOUNTER — Ambulatory Visit: Payer: 59 | Admitting: Certified Registered Nurse Anesthetist

## 2021-08-21 ENCOUNTER — Ambulatory Visit: Payer: 59 | Admitting: Urgent Care

## 2021-08-21 ENCOUNTER — Encounter
Admission: RE | Disposition: A | Discharge status: Home / Self Care | Payer: Self-pay | Source: Home / Self Care | Attending: Pulmonary Disease

## 2021-08-21 ENCOUNTER — Other Ambulatory Visit: Payer: Self-pay

## 2021-08-21 ENCOUNTER — Ambulatory Visit
Admission: RE | Admit: 2021-08-21 | Discharge: 2021-08-21 | Disposition: A | Discharge status: Home / Self Care | Payer: 59 | Attending: Pulmonary Disease | Admitting: Pulmonary Disease

## 2021-08-21 DIAGNOSIS — Z8052 Family history of malignant neoplasm of bladder: Secondary | ICD-10-CM | POA: Insufficient documentation

## 2021-08-21 DIAGNOSIS — Z9081 Acquired absence of spleen: Secondary | ICD-10-CM | POA: Insufficient documentation

## 2021-08-21 DIAGNOSIS — Z882 Allergy status to sulfonamides status: Secondary | ICD-10-CM | POA: Insufficient documentation

## 2021-08-21 DIAGNOSIS — Z923 Personal history of irradiation: Secondary | ICD-10-CM | POA: Diagnosis not present

## 2021-08-21 DIAGNOSIS — Z91048 Other nonmedicinal substance allergy status: Secondary | ICD-10-CM | POA: Diagnosis not present

## 2021-08-21 DIAGNOSIS — Z825 Family history of asthma and other chronic lower respiratory diseases: Secondary | ICD-10-CM | POA: Insufficient documentation

## 2021-08-21 DIAGNOSIS — Z885 Allergy status to narcotic agent status: Secondary | ICD-10-CM | POA: Insufficient documentation

## 2021-08-21 DIAGNOSIS — Z8616 Personal history of COVID-19: Secondary | ICD-10-CM | POA: Diagnosis not present

## 2021-08-21 DIAGNOSIS — J189 Pneumonia, unspecified organism: Secondary | ICD-10-CM | POA: Insufficient documentation

## 2021-08-21 DIAGNOSIS — Z91041 Radiographic dye allergy status: Secondary | ICD-10-CM | POA: Diagnosis not present

## 2021-08-21 DIAGNOSIS — Z9221 Personal history of antineoplastic chemotherapy: Secondary | ICD-10-CM | POA: Diagnosis not present

## 2021-08-21 DIAGNOSIS — Z800 Family history of malignant neoplasm of digestive organs: Secondary | ICD-10-CM | POA: Diagnosis not present

## 2021-08-21 DIAGNOSIS — Z801 Family history of malignant neoplasm of trachea, bronchus and lung: Secondary | ICD-10-CM | POA: Insufficient documentation

## 2021-08-21 DIAGNOSIS — Z8249 Family history of ischemic heart disease and other diseases of the circulatory system: Secondary | ICD-10-CM | POA: Diagnosis not present

## 2021-08-21 DIAGNOSIS — Z888 Allergy status to other drugs, medicaments and biological substances status: Secondary | ICD-10-CM | POA: Insufficient documentation

## 2021-08-21 DIAGNOSIS — Z8572 Personal history of non-Hodgkin lymphomas: Secondary | ICD-10-CM | POA: Insufficient documentation

## 2021-08-21 DIAGNOSIS — Z91040 Latex allergy status: Secondary | ICD-10-CM | POA: Insufficient documentation

## 2021-08-21 HISTORY — PX: BRONCHIAL WASHINGS: SHX5105

## 2021-08-21 HISTORY — PX: FLEXIBLE BRONCHOSCOPY: SHX5094

## 2021-08-21 LAB — BODY FLUID CELL COUNT WITH DIFFERENTIAL
Eos, Fluid: 0 %
Lymphs, Fluid: 30 %
Monocyte-Macrophage-Serous Fluid: 6 % — ABNORMAL LOW (ref 50–90)
Neutrophil Count, Fluid: 64 % — ABNORMAL HIGH (ref 0–25)
Total Nucleated Cell Count, Fluid: 49 cu mm (ref 0–1000)

## 2021-08-21 LAB — GLUCOSE, CAPILLARY
Glucose-Capillary: 82 mg/dL (ref 70–99)
Glucose-Capillary: 84 mg/dL (ref 70–99)

## 2021-08-21 SURGERY — BRONCHOSCOPY, FLEXIBLE
Anesthesia: General

## 2021-08-21 MED ORDER — LIDOCAINE HCL (CARDIAC) PF 100 MG/5ML IV SOSY
PREFILLED_SYRINGE | INTRAVENOUS | Status: DC | PRN
  Administered 2021-08-21: 100 mg via INTRAVENOUS

## 2021-08-21 MED ORDER — EPHEDRINE SULFATE 50 MG/ML IJ SOLN
INTRAMUSCULAR | Status: DC | PRN
  Administered 2021-08-21: 10 mg via INTRAVENOUS

## 2021-08-21 MED ORDER — DEXAMETHASONE SODIUM PHOSPHATE 10 MG/ML IJ SOLN
INTRAMUSCULAR | Status: DC | PRN
  Administered 2021-08-21: 10 mg via INTRAVENOUS

## 2021-08-21 MED ORDER — FENTANYL CITRATE (PF) 100 MCG/2ML IJ SOLN
INTRAMUSCULAR | Status: DC | PRN
  Administered 2021-08-21: 50 ug via INTRAVENOUS

## 2021-08-21 MED ORDER — PROPOFOL 10 MG/ML IV BOLUS
INTRAVENOUS | Status: DC | PRN
  Administered 2021-08-21: 150 mg via INTRAVENOUS

## 2021-08-21 MED ORDER — ONDANSETRON HCL 4 MG/2ML IJ SOLN
INTRAMUSCULAR | Status: DC | PRN
  Administered 2021-08-21: 4 mg via INTRAVENOUS

## 2021-08-21 MED ORDER — LIDOCAINE HCL (PF) 2 % IJ SOLN
INTRAMUSCULAR | Status: AC
  Filled 2021-08-21: qty 5

## 2021-08-21 MED ORDER — MIDAZOLAM HCL 2 MG/2ML IJ SOLN
INTRAMUSCULAR | Status: AC
  Filled 2021-08-21: qty 2

## 2021-08-21 MED ORDER — MIDAZOLAM HCL 2 MG/2ML IJ SOLN
INTRAMUSCULAR | Status: DC | PRN
  Administered 2021-08-21: 2 mg via INTRAVENOUS

## 2021-08-21 MED ORDER — CHLORHEXIDINE GLUCONATE 0.12 % MT SOLN
OROMUCOSAL | Status: AC
  Administered 2021-08-21: 15 mL via OROMUCOSAL
  Filled 2021-08-21: qty 15

## 2021-08-21 MED ORDER — FENTANYL CITRATE (PF) 100 MCG/2ML IJ SOLN
INTRAMUSCULAR | Status: AC
  Filled 2021-08-21: qty 2

## 2021-08-21 MED ORDER — SUCCINYLCHOLINE CHLORIDE 200 MG/10ML IV SOSY
PREFILLED_SYRINGE | INTRAVENOUS | Status: DC | PRN
  Administered 2021-08-21: 120 mg via INTRAVENOUS

## 2021-08-21 NOTE — Anesthesia Postprocedure Evaluation (Signed)
Anesthesia Post Note  Patient: Brian Atkins  Procedure(s) Performed: [place]  Patient location during evaluation: PACU Anesthesia Type: General Level of consciousness: awake and alert Pain management: pain level controlled Vital Signs Assessment: post-procedure vital signs reviewed and stable Respiratory status: spontaneous breathing, nonlabored ventilation, respiratory function stable and patient connected to nasal cannula oxygen Cardiovascular status: blood pressure returned to baseline and stable Postop Assessment: no apparent nausea or vomiting Anesthetic complications: no  No notable events documented.   Last Vitals:  Vitals:   08/21/21 1315 08/21/21 1322  BP: 125/89 122/82  Pulse: 63 63  Resp: 18 16  Temp:  (!) 36.4 C  SpO2: 91% 95%    Last Pain:  Vitals:   08/21/21 1322  TempSrc:   PainSc: 0-No pain                 Arita Miss

## 2021-08-21 NOTE — Transfer of Care (Signed)
Immediate Anesthesia Transfer of Care Note  Patient: Brian Atkins  Procedure(s) Performed: FLEXIBLE BRONCHOSCOPY BRONCHIAL WASHINGS  Patient Location: PACU  Anesthesia Type:General  Level of Consciousness: drowsy  Airway & Oxygen Therapy: Patient Spontanous Breathing and Patient connected to nasal cannula oxygen  Post-op Assessment: Report given to RN and Post -op Vital signs reviewed and stable  Post vital signs: Reviewed and stable  Last Vitals:  Vitals Value Taken Time  BP 130/79 08/21/21 1253  Temp    Pulse 80 08/21/21 1254  Resp 24 08/21/21 1254  SpO2 96 % 08/21/21 1254  Vitals shown include unvalidated device data.  Last Pain:  Vitals:   08/21/21 1123  TempSrc: Tympanic  PainSc: 0-No pain      Patients Stated Pain Goal: 0 (40/98/11 9147)  Complications: No notable events documented.

## 2021-08-21 NOTE — H&P (Signed)
Pulmonary Medicine          Date: 08/21/2021,   MRN# [id number] Brian Atkins 1956/02/04     AdmissionWeight: 95.3 kg                 CurrentWeight: 95.3 kg      CHIEF COMPLAINT:   Recurrent pneumonia with failure of outpatient therapy.   HISTORY OF PRESENT ILLNESS   Is a very pleasant 65 year old male with previous episodes of angioedema which required mechanical ventilation in the past.  Also has a history of illicit Phoma status posttreatment with radiation.  Had COVID-19 in January 2021.  I had seen him in the office for chronic and recurrent cough which has worsened post COVID-19.  He also had GERD in the past with fundoplication and is on twice daily Dexilant.  Over the last 6 months despite numerous courses of antibiotics he continues to expectorate heavy phlegm which is discolored and is resistant to prednisone antimicrobial therapy.  Today patient presents for evaluation of lungs with bronchoscopic airway exam and aspiration of tracheobronchial tree with mucous plugging.  Patient is a never smoker but due to chronic and recurrent pneumonia has had FEV1 at 65% with reduced total lung capacity at 58% reviewed procedure in detail today and patient understands risks and benefits and wishes to proceed with bronchoscopy.   PAST MEDICAL HISTORY   Past Medical History:  Diagnosis Date   Complication of anesthesia    slow to wake   Coronary artery disease, non-occlusive    a. LHC 12/18: ostLAD 20%, p/mLAD 60% FFR 0.84, ost ramus 50%, mRCA 50% FFR 0.94, EF 65%   COVID-19 virus infection 12/28/2019   Diverticulitis    Diverticulosis    Dyslipidemia    Dyspnea    Family history of adverse reaction to anesthesia    Mother - slow to wake   GERD (gastroesophageal reflux disease)    Hemorrhoids    Hiatal hernia    History of 2019 novel coronavirus disease (COVID-19) 12/24/2019   History of echocardiogram    a. TTE 1/19: EF of 60-65%, normal wall motion, normal LV  diastolic function, mildly dilated LA   History of kidney stones    Labile hypertension    Lymphoma (HCC)    Medial epicondylitis of right elbow    Meralgia paresthetica of right side    Microscopic hematuria    Ocular migraine    Pneumonia    Sleep apnea    CPAP     SURGICAL HISTORY   Past Surgical History:  Procedure Laterality Date   COLONOSCOPY     INTRAVASCULAR PRESSURE WIRE/FFR STUDY N/A 12/07/2017   Procedure: INTRAVASCULAR PRESSURE WIRE/FFR STUDY;  Surgeon: Nelva Bush, MD;  Location: [facility];  Service: Cardiovascular;  Laterality: N/A;   LEFT HEART CATH AND CORONARY ANGIOGRAPHY Left 12/07/2017   Procedure: LEFT HEART CATH AND CORONARY ANGIOGRAPHY;  Surgeon: Nelva Bush, MD;  Location: [facility];  Service: Cardiovascular;  Laterality: Left;   nissen funduplication  [id number]   Brian Atkins   SEPTOPLASTY Bilateral 08/23/2019   Procedure: SEPTOPLASTY;  Surgeon: [name], MD;  Location: [facility];  Service: ENT;  Laterality: Bilateral;   SPLENECTOMY     TURBINATE REDUCTION Bilateral 08/23/2019   Procedure: TURBINATE REDUCTION;  Surgeon: [name], MD;  Location: [facility];  Service: ENT;  Laterality: Bilateral;  Latex sensitivity sleep apnea     FAMILY HISTORY   Family History  Problem Relation Age of Onset   Coronary artery disease Father 11       CABG   Aortic aneurysm Father 79       repaired   Hyperlipidemia Father    Lung cancer Mother    Hypertension Mother    Cancer Mother        bladder   COPD Mother    Stomach cancer Maternal Grandfather      SOCIAL HISTORY   Social History   Tobacco Use   Smoking status: Never   Smokeless tobacco: Never  Vaping Use   Vaping Use: Never used  Substance Use Topics   Alcohol use: No   Drug use: No     MEDICATIONS    Home Medication:    Current Medication:  Current Facility-Administered Medications:    lactated ringers infusion, , Intravenous,  Continuous, Molli Barrows, MD, Last Rate: 10 mL/hr at 08/21/21 1134, New Bag at 08/21/21 1134    ALLERGIES   Avelox [moxifloxacin], Gadolinium derivatives, Contrast media [iodinated diagnostic agents], Crestor [rosuvastatin calcium], Morphine and related, Niacin, Niacin-simvastatin er, Sulfa antibiotics, Latex, Tape, Tapentadol, and Wound dressing adhesive     REVIEW OF SYSTEMS    Review of Systems:  Gen:  Denies  fever, sweats, chills weigh loss  HEENT: Denies blurred vision, double vision, ear pain, eye pain, hearing loss, nose bleeds, sore throat Cardiac:  No dizziness, chest pain or heaviness, chest tightness,edema Resp:   Denies cough or sputum porduction, shortness of breath,wheezing, hemoptysis,  Gi: Denies swallowing difficulty, stomach pain, nausea or vomiting, diarrhea, constipation, bowel incontinence Gu:  Denies bladder incontinence, burning urine Ext:   Denies Joint pain, stiffness or swelling Skin: Denies  skin rash, easy bruising or bleeding or hives Endoc:  Denies polyuria, polydipsia , polyphagia or weight change Psych:   Denies depression, insomnia or hallucinations   Other:  All other systems negative   VS: BP (!) 147/92   Pulse 61   Temp 97.6 F (36.4 C) (Tympanic)   Resp 18   Ht _0  (1.88 m)   Wt 95.3 kg   SpO2 97%   BMI 26.96 kg/m      PHYSICAL EXAM    GENERAL:NAD, no fevers, chills, no weakness no fatigue HEAD: Normocephalic, atraumatic.  EYES: Pupils equal, round, reactive to light. Extraocular muscles intact. No scleral icterus.  MOUTH: Moist mucosal membrane. Dentition intact. No abscess noted.  EAR, NOSE, THROAT: Clear without exudates. No external lesions.  NECK: Supple. No thyromegaly. No nodules. No JVD.  PULMONARY: Clear to auscultation with mild rhonchorous breath sound CARDIOVASCULAR: S1 and S2. Regular rate and rhythm. No murmurs, rubs, or gallops. No edema. Pedal pulses 2+ bilaterally.  GASTROINTESTINAL: Soft, nontender,  nondistended. No masses. Positive bowel sounds. No hepatosplenomegaly.  MUSCULOSKELETAL: No swelling, clubbing, or edema. Range of motion full in all extremities.  NEUROLOGIC: Cranial nerves II through XII are intact. No gross focal neurological deficits. Sensation intact. Reflexes intact.  SKIN: No ulceration, lesions, rashes, or cyanosis. Skin warm and dry. Turgor intact.  PSYCHIATRIC: Mood, affect within normal limits. The patient is awake, alert and oriented x 3. Insight, judgment intact.       IMAGING    No results found.    ASSESSMENT/PLAN    Recurrent bilateral pneumonia with heavy phlegm on expectoration failure of outpatient therapy    -Plan for bronchoscopy with BAL and aspiration of tracheobronchial tree due to mucous plugging  -Pulmonology specimens to be sent for fungal and bacterial etiology  of pneumonia   -Reviewed risks/complications and benefits with patient, risks include infection, pneumothorax/pneumomediastinum which may require chest tube placement as well as overnight/prolonged hospitalization and possible mechanical ventilation. Other risks include bleeding and very rarely death.  Patient understands risks and wishes to proceed.  Additional questions were answered, and patient is aware that post procedure patient will be going home with family and may experience cough with possible clots on expectoration as well as phlegm which may last few days as well as hoarseness of voice post intubation and mechanical ventilation.    Thank you for allowing me to participate in the care of this patient.  Total face to face encounter time for this patient visit was >45 min. >50% of the time was  spent in counseling and coordination of care.   Patient/Family are satisfied with care plan and all questions have been answered.  This document was prepared using Dragon voice recognition software and may include unintentional dictation errors.     Ottie Glazier, M.D.  Division of  [place]

## 2021-08-21 NOTE — Procedures (Signed)
PROCEDURE: BRONCHOSCOPY Therapeutic Aspiration of Tracheobronchial Tree  PROCEDURE DATE: 08/21/2021  TIME:  NAME:  Brian Atkins  DOB:[birth year]  MRN: [id number] LOC:  ARPO/None    HOSP DAY: _0 @ CODE STATUS:   Code Status History     Date Active Date Inactive Code Status Order ID Comments User Context   12/07/2017 1013 12/07/2017 1755 Full Code [id number]  Nelva Bush, MD Inpatient      Questions for Most Recent Historical Code Status (Order [id number])            Advance Directive Documentation    Flowsheet Row Most Recent Value  Type of Advance Directive Healthcare Power of [place], Living will  Pre-existing out of facility DNR order (yellow form or pink MOST form) --  "MOST" Form in Place? --           Indications/Preliminary Diagnosis:   Consent: (Place X beside choice/s below)  The benefits, risks and possible complications of the procedure were        explained to:  __x_ patient  ___ patient's family  ___ other:___________  who verbalized understanding and gave:  ___ verbal  _x__ written  ___ verbal and written  ___ telephone  ___ other:________ consent.      Unable to obtain consent; procedure performed on emergent basis.     Other:       PRESEDATION ASSESSMENT: History and Physical has been performed. Patient meds and allergies have been reviewed. Presedation airway examination has been performed and documented. Baseline vital signs, sedation score, oxygenation status, and cardiac rhythm were reviewed. Patient was deemed to be in satisfactory condition to undergo the procedure.    PREMEDICATIONS:   Sedative/Narcotic Amt Dose   Versed  mg   Fentanyl  mcg  Diprivan  mg  As per anesthesia xc x        PROCEDURE DETAILS: Timeout performed and correct patient, name, & ID confirmed. Following prep per Pulmonary policy, appropriate sedation was administered. The Bronchoscope was inserted in to oral cavity with bite block in place.  Therapeutic aspiration of Tracheobronchial tree was performed.  Airway exam proceeded with findings, technical procedures, and specimen collection as noted below. At the end of exam the scope was withdrawn without incident. Impression and Plan as noted below.           Airway Prep (Place X beside choice below)   1% Transtracheal Lidocaine Anesthetization 7 cc   Patient prepped per Bronchoscopy Lab Policy       Insertion Route (Place X beside choice below)   Nasal   Oral  x Endotracheal Tube   Tracheostomy   INTRAPROCEDURE MEDICATIONS:  PROPOFOL WAS USED AS PER ANESTHESIA TEAM   Medication Amt Dose  Medication Amt Dose  Lidocaine 1%  cc  Epinephrine 1:10,000 sol  cc  Xylocaine 4%  cc  Cocaine  cc   TECHNICAL PROCEDURES: (Place X beside choice below)   Procedures  Description    None     Electrocautery     Cryotherapy     Balloon Dilatation     Bronchography     Stent Placement   X  Therapeutic Aspiration RLL X 1    Laser/Argon Plasma    Brachytherapy Catheter Placement    Foreign Body Removal         SPECIMENS (Sites): (Place X beside choice below)  Specimens Description   No Specimens Obtained     Washings   X3 Lavage BAL X 3, RML, LLL, RLL  Biopsies    Fine Needle Aspirates    Brushings    Sputum    FINDINGS:  MUCOID PLUGGING WAS EVACUATED BILATERALLY WORSE AT RLL.  BAL WAS DONE WITH CLOUDY AND SEROSANG RETURN.  NO BIOPSY OR BRUSHING DONE.   MUCOSA IS FRIABLE AND EASILY DISRUPTED WITH WASHINGS ESTIMATED BLOOD LOSS: none COMPLICATIONS/RESOLUTION: none      IMPRESSION:POST-PROCEDURE DX:   EDEMA AND FRIABLE MUCOSA SUGGESTIVE OF PNEUMONITIS POSSIBLE FROM MICROASPIRATION DUE TO SEVERE GERD HX OF FUNDUPLICATION ON MAX PPI THERAPY  RECOMMENDATION/PLAN:   AWAIT CULTURES FROM MICROBIOLOGY  Ottie Glazier, M.D.  Pulmonary & [place]

## 2021-08-21 NOTE — Discharge Instructions (Signed)
AMBULATORY SURGERY  ?DISCHARGE INSTRUCTIONS ? ? ?The drugs that you were given will stay in your system until tomorrow so for the next 24 hours you should not: ? ?Drive an automobile ?Make any legal decisions ?Drink any alcoholic beverage ? ? ?You may resume regular meals tomorrow.  Today it is better to start with liquids and gradually work up to solid foods. ? ?You may eat anything you prefer, but it is better to start with liquids, then soup and crackers, and gradually work up to solid foods. ? ? ?Please notify your doctor immediately if you have any unusual bleeding, trouble breathing, redness and pain at the surgery site, drainage, fever, or pain not relieved by medication. ? ? ? ?Additional Instructions: ? ? ? ?Please contact your physician with any problems or Same Day Surgery at [phone], Monday through Friday 6 am to 4 pm, or [place] at [place] Main number at [phone].  ?

## 2021-08-21 NOTE — Anesthesia Preprocedure Evaluation (Signed)
Anesthesia Evaluation  Patient identified by MRN, date of birth, ID band Patient awake    Reviewed: Allergy & Precautions, H&P , NPO status , Patient's Chart, lab work & pertinent test results  History of Anesthesia Complications Negative for: history of anesthetic complications  Airway Mallampati: II  TM Distance: >3 FB Neck ROM: full    Dental no notable dental hx.    Pulmonary sleep apnea , neg COPD, Patient abstained from smoking.Not current smoker,  Hx angioedema 2015 s/p chemorad   Pulmonary exam normal breath sounds clear to auscultation       Cardiovascular Exercise Tolerance: Good METShypertension, + CAD (nonocclusive)  (-) Past MI Normal cardiovascular exam(-) dysrhythmias  Rhythm:regular Rate:Normal  TTE 2021 1. Left ventricular ejection fraction, by estimation, is 60 to 65%. The  left ventricle has normal function. The left ventricle has no regional  wall motion abnormalities. There is mild left ventricular hypertrophy.  Left ventricular diastolic parameters  are consistent with Grade I diastolic dysfunction (impaired relaxation).  2. Right ventricular systolic function is normal. The right ventricular  size is normal. Tricuspid regurgitation signal is inadequate for assessing  PA pressure.  3. The mitral valve is normal in structure and function. Trivial mitral  valve regurgitation. No evidence of mitral stenosis.  4. The aortic valve is tricuspid. Aortic valve regurgitation is not  visualized. No aortic stenosis is present.    Neuro/Psych  Headaches,  Neuromuscular disease (merlagia paresthetica) negative psych ROS   GI/Hepatic GERD (s/p Nissen)  ,(+)     (-) substance abuse  , Motility disorder   Endo/Other  neg diabetesPrediabetes   Renal/GU negative Renal ROS     Musculoskeletal   Abdominal   Peds  Hematology MALT lymphoma s/p chemo, rad, splenectomy   Anesthesia Other Findings Past  Medical History: No date: Complication of anesthesia     Comment:  slow to wake No date: Coronary artery disease, non-occlusive     Comment:  a. LHC 12/18: ostLAD 20%, p/mLAD 60% FFR 0.84, ost ramus              50%, mRCA 50% FFR 0.94, EF 65% 12/28/2019: COVID-19 virus infection No date: Diverticulitis No date: Diverticulosis No date: Dyslipidemia No date: Dyspnea No date: Family history of adverse reaction to anesthesia     Comment:  Mother - slow to wake No date: GERD (gastroesophageal reflux disease) No date: Hemorrhoids No date: Hiatal hernia 12/24/2019: History of 2019 novel coronavirus disease (COVID-19) No date: History of echocardiogram     Comment:  a. TTE 1/19: EF of 60-65%, normal wall motion, normal LV              diastolic function, mildly dilated LA No date: History of kidney stones No date: Labile hypertension No date: Lymphoma ([place]) No date: Medial epicondylitis of right elbow No date: Meralgia paresthetica of right side No date: Microscopic hematuria No date: Ocular migraine No date: Pneumonia No date: Sleep apnea     Comment:  CPAP   Reproductive/Obstetrics                             Anesthesia Physical  Anesthesia Plan  ASA: II  Anesthesia Plan: General ETT   Post-op Pain Management:    Induction:   PONV Risk Score and Plan: 2 and Ondansetron, Dexamethasone, Scopolamine patch - Pre-op and Treatment may vary due to age or medical condition  Airway Management Planned: Oral ETT  Additional Equipment:   Intra-op Plan:   Post-operative Plan:   Informed Consent: I have reviewed the patients History and Physical, chart, labs and discussed the procedure including the risks, benefits and alternatives for the proposed anesthesia with the patient or authorized representative who has indicated his/her understanding and acceptance.       Plan Discussed with: CRNA  Anesthesia Plan Comments: (Hx angioedema s/p radiation Tx  for Lymphoma, requiring intubation in 2015; received C1 esterase inhibitor preop for splenectomy in 2016 due to high lymphoma load at the time, but did not receive prior to other anesthetics in 2019.  No anesthesia complications for last procedure, inguinal hernia repair on 09/27/18.  Easy mask, 7.5 ETT placed 1st attempt under grade IIa view via DL.  Medications included lidocaine, fentanyl, propofol, dexamethasone, ondansetron, rocuronium, sugammadex, cefazolin.)        Anesthesia Quick Evaluation

## 2021-08-21 NOTE — Anesthesia Procedure Notes (Signed)
Procedure Name: Intubation Date/Time: 08/21/2021 12:33 PM Performed by: Tollie Eth, CRNA Pre-anesthesia Checklist: Patient identified, Patient being monitored, Timeout performed, Emergency Drugs available and Suction available Patient Re-evaluated:Patient Re-evaluated prior to induction Oxygen Delivery Method: Circle system utilized Preoxygenation: Pre-oxygenation with 100% oxygen Induction Type: IV induction Ventilation: Mask ventilation without difficulty and Oral airway inserted - appropriate to patient size Laryngoscope Size: McGraph and 4 Grade View: Grade I Tube type: Oral Tube size: 8.5 mm Number of attempts: 1 Airway Equipment and Method: Stylet and Video-laryngoscopy Placement Confirmation: ETT inserted through vocal cords under direct vision, positive ETCO2 and breath sounds checked- equal and bilateral Secured at: 21 cm Tube secured with: Tape Dental Injury: Teeth and Oropharynx as per pre-operative assessment

## 2021-08-22 ENCOUNTER — Encounter: Payer: Self-pay | Admitting: Pulmonary Disease

## 2021-08-22 LAB — ACID FAST SMEAR (AFB, MYCOBACTERIA): Acid Fast Smear: NEGATIVE

## 2021-08-24 LAB — CULTURE, BAL-QUANTITATIVE W GRAM STAIN: Culture: 10000 — AB

## 2021-08-25 ENCOUNTER — Ambulatory Visit (INDEPENDENT_AMBULATORY_CARE_PROVIDER_SITE_OTHER): Payer: 59 | Admitting: Vascular Surgery

## 2021-08-26 LAB — ANAEROBIC CULTURE W GRAM STAIN

## 2021-08-26 LAB — ASPERGILLUS ANTIGEN, BAL/SERUM: Aspergillus Ag, BAL/Serum: 0.09 Index (ref 0.00–0.49)

## 2021-08-27 ENCOUNTER — Other Ambulatory Visit: Payer: Self-pay | Admitting: Internal Medicine

## 2021-08-27 NOTE — Telephone Encounter (Signed)
RX Refill:ambien Last Seen:07-22-21 Last ordered:08-17-21  Unable to refuse medication. Look like it was already done.

## 2021-08-29 ENCOUNTER — Other Ambulatory Visit: Payer: Self-pay | Admitting: Internal Medicine

## 2021-08-29 LAB — PNEUMOCYSTIS PCR: Result Pneumocystis PCR: NEGATIVE

## 2021-09-11 LAB — CULTURE, FUNGUS WITHOUT SMEAR

## 2021-09-14 ENCOUNTER — Other Ambulatory Visit: Payer: Self-pay

## 2021-09-14 ENCOUNTER — Encounter: Payer: Self-pay | Admitting: Surgery

## 2021-09-14 ENCOUNTER — Ambulatory Visit (INDEPENDENT_AMBULATORY_CARE_PROVIDER_SITE_OTHER): Payer: Medicare Other | Admitting: Surgery

## 2021-09-14 VITALS — BP 145/91 | HR 76 | Temp 98.5°F | Ht 74.0 in | Wt 214.4 lb

## 2021-09-14 DIAGNOSIS — I2510 Atherosclerotic heart disease of native coronary artery without angina pectoris: Secondary | ICD-10-CM | POA: Diagnosis not present

## 2021-09-14 DIAGNOSIS — K449 Diaphragmatic hernia without obstruction or gangrene: Secondary | ICD-10-CM

## 2021-09-14 DIAGNOSIS — K219 Gastro-esophageal reflux disease without esophagitis: Secondary | ICD-10-CM | POA: Diagnosis not present

## 2021-09-14 DIAGNOSIS — R1084 Generalized abdominal pain: Secondary | ICD-10-CM

## 2021-09-14 NOTE — Patient Instructions (Addendum)
We will get you set up for a CT scan of the Abdomen and Pelvis with contrast(we will send in the pre medication for the contrast). We will also get you set up for a Barium Swallow study.  We will call you about this scan and study.   We will refer you to [facility] for you to have an Upper Endoscopy. They will call you to schedule this appointment.   We will have you follow up here once all these studies are done.

## 2021-09-15 ENCOUNTER — Telehealth: Payer: Self-pay

## 2021-09-15 MED ORDER — DIPHENHYDRAMINE HCL 50 MG PO TABS: 50.0000 mg | ORAL_TABLET | Freq: Once | ORAL | 0 refills | Status: DC

## 2021-09-15 MED ORDER — PREDNISONE 50 MG PO TABS
ORAL_TABLET | ORAL | 0 refills | Status: DC
Start: 2021-09-15 — End: 2021-10-26

## 2021-09-15 NOTE — Telephone Encounter (Signed)
Call to patient to let him know of scan information.  The patient is scheduled for a Barium swallow at [facility] on 09/23/21. Arrival time is 9:30 am at the [facility] entrance. He is to have nothing to eat or drink after midnight the night prior.  The patient is scheduled for a CT scan of the abdomen & pelvis with contrast at [facility] on 09/30/21. Arrival time is 1:00 pm at the [facility] entrance. He is to have nothing to eat or drink for 4 hours prior. He will also need to pick up a prep kit a few days prior to his scan. We have sent in the pre medication prescription to his pharmacy for allergy to contrast dye.

## 2021-09-21 ENCOUNTER — Encounter: Payer: Self-pay | Admitting: Surgery

## 2021-09-21 NOTE — Progress Notes (Signed)
Patient ID: Brian Atkins, male   DOB: 1956/11/14, 65 y.o.   MRN: [id number]  HPI Brian Atkins is a 65 y.o. male seen in consultation at the request of Dr.Aleskerov for recalcitrant GERD.  Of note he had a prior Nissen fundoplication more than 20 years ago by Dr. Hassell Done in [place].  He stated that his symptoms dramatically proved after the surgery but is slowly came back.  He does have significant reflux symptoms consistent with heartburn.  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  He usually is seen at [facility] and already had a manometry as well as pH probe.  The results are pending at this time.  He did have a recent CT scan of the chest that I have personally reviewed showing evidence of bronchiectasis.  THere was evidence of a small hiatal hernia and there was evidence of Nissen fundoplication. Does have significant history of coronary artery disease.  He does have a preserved ejection fraction and is able to perform more than 4 METS of activity without any shortness of air or chest pain.  He is a Engineer, drilling. 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 He does take PPI w only partial relieve of sxs. He seems to have some dysphagia but seems to be only cervical and upper esophageal dysphagia seems to be no correlation with what type of meals. He did have an EGD in 2019 showing evidence of gastric inlet patches. Also reports some epigastric pains few times a week. Did have labs a couple months ago including a CBC INR and CMP and they all are completely normal  HPI  Past Medical History:  Diagnosis Date   Complication of anesthesia    slow to wake   Coronary artery disease, non-occlusive    a. LHC 12/18: ostLAD 20%, p/mLAD 60% FFR 0.84, ost ramus 50%, mRCA 50% FFR 0.94, EF 65%   COVID-19 virus infection 12/28/2019   Diverticulitis    Diverticulosis    Dyslipidemia    Dyspnea    Family history  of adverse reaction to anesthesia    Mother - slow to wake   GERD (gastroesophageal reflux disease)    Hemorrhoids    Hiatal hernia    History of 2019 novel coronavirus disease (COVID-19) 12/24/2019   History of echocardiogram    a. TTE 1/19: EF of 60-65%, normal wall motion, normal LV diastolic function, mildly dilated LA   History of kidney stones    Labile hypertension    Lymphoma (HCC)    Medial epicondylitis of right elbow    Meralgia paresthetica of right side    Microscopic hematuria    Ocular migraine    Pneumonia    Sleep apnea    CPAP    Past Surgical History:  Procedure Laterality Date   BRONCHIAL WASHINGS N/A 08/21/2021   Procedure: BRONCHIAL WASHINGS;  Surgeon: Ottie Glazier, MD;  Location: [facility];  Service: Thoracic;  Laterality: N/A;   COLONOSCOPY     FLEXIBLE BRONCHOSCOPY N/A 08/21/2021   Procedure: FLEXIBLE BRONCHOSCOPY;  Surgeon: Ottie Glazier, MD;  Location: [facility];  Service: Thoracic;  Laterality: N/A;   INTRAVASCULAR PRESSURE WIRE/FFR STUDY N/A 12/07/2017   Procedure: INTRAVASCULAR PRESSURE WIRE/FFR STUDY;  Surgeon: Nelva Bush, MD;  Location: [place] [facility];  Service: Cardiovascular;  Laterality: N/A;   LEFT HEART CATH AND CORONARY ANGIOGRAPHY Left 12/07/2017   Procedure: LEFT HEART CATH AND CORONARY  ANGIOGRAPHY;  Surgeon: Nelva Bush, MD;  Location: [place] [facility];  Service: Cardiovascular;  Laterality: Left;   nissen funduplication  [id number]   Brian Atkins   SEPTOPLASTY Bilateral 08/23/2019   Procedure: SEPTOPLASTY;  Surgeon: [name], MD;  Location: [place];  Service: ENT;  Laterality: Bilateral;   SPLENECTOMY     TURBINATE REDUCTION Bilateral 08/23/2019   Procedure: TURBINATE REDUCTION;  Surgeon: [name], MD;  Location: [place];  Service: ENT;  Laterality: Bilateral;  Latex sensitivity sleep apnea    Family History  Problem Relation Age of Onset   Coronary artery disease Father 20       CABG    Aortic aneurysm Father 57       repaired   Hyperlipidemia Father    Lung cancer Mother    Hypertension Mother    Cancer Mother        bladder   COPD Mother    Stomach cancer Maternal Grandfather     Social History Social History   Tobacco Use   Smoking status: Never    Passive exposure: Past   Smokeless tobacco: Never  Vaping Use   Vaping Use: Never used  Substance Use Topics   Alcohol use: No   Drug use: No    Allergies  Allergen Reactions   Avelox [Moxifloxacin] Hives and Swelling    Severe facial swelling   Gadolinium Derivatives Swelling    (OK with pre-medication) (OK with pre-medication)   Contrast Media [Iodinated Diagnostic Agents] Swelling    (OK with pre-medication)   Crestor [Rosuvastatin Calcium]     myalgia   Morphine And Related Itching   Niacin     Other reaction(s): Unknown   Niacin-Simvastatin Er     myalgia   Latex Rash    Gloves cause raxh   Tape Dermatitis   Tapentadol Dermatitis   Wound Dressing Adhesive Dermatitis    Current Outpatient Medications  Medication Sig Dispense Refill   albuterol (VENTOLIN HFA) 108 (90 Base) MCG/ACT inhaler Inhale 2 puffs into the lungs every 6 (six) hours as needed for wheezing or shortness of breath.     amLODipine (NORVASC) 5 MG tablet Take 5 mg by mouth daily.     aspirin 81 MG chewable tablet Chew by mouth daily.     butalbital-acetaminophen-caffeine (FIORICET) 50-325-40 MG tablet Take 1 tablet by mouth every 4 (four) hours as needed for headache.     carvedilol (COREG) 12.5 MG tablet Take 1 tablet (12.5 mg total) by mouth 2 (two) times daily. 180 tablet 3   cyanocobalamin (,VITAMIN B-12,) 1000 MCG/ML injection INJECT 1 ML INTO THE MUSCLE DAILY FOR 3 DAYS, THEN WEEKLY FOR 4 WEEKS, THEN MONTHLY THEREAFTER (Patient taking differently: Inject 1,000 mcg into the muscle every 30 (thirty) days.) 20 mL 0   ezetimibe (ZETIA) 10 MG tablet TAKE 1 TABLET BY MOUTH EVERY DAY (Patient taking differently: Take 10 mg by  mouth daily.) 90 tablet 1   famotidine (PEPCID) 20 MG tablet Take 20 mg by mouth 2 (two) times daily.     hydrochlorothiazide (MICROZIDE) 12.5 MG capsule TAKE ONE CAPSULE BY MOUTH ONE TIME DAILY (Patient taking differently: Take 12.5 mg by mouth daily.) 90 capsule 0   losartan (COZAAR) 100 MG tablet TAKE 1 TABLET BY MOUTH EVERY DAY (Patient taking differently: Take 100 mg by mouth daily.) 90 tablet 3   Multiple Vitamins-Minerals (MULTIVITAMIN WITH MINERALS) tablet Take 1 tablet by mouth daily.     omeprazole (PRILOSEC) 40  MG capsule Take 40 mg by mouth daily.     prochlorperazine (COMPAZINE) 5 MG tablet Take 5 mg by mouth every 6 (six) hours as needed for nausea or vomiting.     Prucalopride Succinate (MOTEGRITY) 2 MG TABS Take 2 mg by mouth daily as needed (constipation).     rOPINIRole (REQUIP) 0.25 MG tablet Take 0.25 mg by mouth in the morning and at bedtime.      rosuvastatin (CRESTOR) 20 MG tablet Take 20 mg by mouth daily.     Syringe/Needle, Disp, (SYRINGE 3CC/25GX1") 25G X 1" 3 ML MISC Use for b12 injections 50 each 0   triamcinolone (NASACORT) 55 MCG/ACT AERO nasal inhaler Place 2 sprays into the nose at bedtime.     zolpidem (AMBIEN CR) 12.5 MG CR tablet TAKE ONE TABLET BY MOUTH AT BEDTIME AS NEEDED FOR SLEEP 30 tablet 5   diphenhydrAMINE (BENADRYL) 50 MG tablet Take 1 tablet (50 mg total) by mouth once for 1 dose. Take 50 mg by mouth 1 hour prior to scan time 1 tablet 0   nitroGLYCERIN (NITROSTAT) 0.4 MG SL tablet Place 1 tablet (0.4 mg total) under the tongue every 5 (five) minutes as needed for chest pain. Maximum of 3 doses. 35 tablet 3   ONETOUCH VERIO test strip USE TO CHECK BLOOD SUGAR AS NEEDED 100 strip 2   predniSONE (DELTASONE) 50 MG tablet Take 1 tablet 13 hours, 7 hours, and 1 hour prior to scan time. 3 tablet 0   No current facility-administered medications for this visit.     Review of Systems Full ROS  was asked and was negative except for the information on the  HPI  Physical Exam Blood pressure (!) 145/91, pulse 76, temperature 98.5 F (36.9 C), height _0  (1.88 m), weight 214 lb 6.4 oz (97.3 kg), SpO2 96 %. CONSTITUTIONAL: NAD EYES: Pupils are equal, round, , Sclera are non-icteric. EARS, NOSE, MOUTH AND THROAT: He is wearing a mask. Hearing is intact to voice. LYMPH NODES:  Lymph nodes in the neck are normal. RESPIRATORY:  Lungs are clear. There is normal respiratory effort, with equal breath sounds bilaterally, and without pathologic use of accessory muscles. CARDIOVASCULAR: Heart is regular without murmurs, gallops, or rubs. GI: The abdomen is soft, nontender, and nondistended. There are no palpable masses. There is no hepatosplenomegaly. There are normal bowel sounds in all quadrants. GU: Rectal deferred.   MUSCULOSKELETAL: Normal muscle strength and tone. No cyanosis or edema.   SKIN: Turgor is good and there are no pathologic skin lesions or ulcers. NEUROLOGIC: Motor and sensation is grossly normal. Cranial nerves are grossly intact. PSYCH:  Oriented to person, place and time. Affect is normal.  Data Reviewed  I have personally reviewed the patient's imaging, laboratory findings and medical records.    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  He already had a history of prior Nissen fundoplication decades ago in [place] by Dr. Hassell Done.  Concerned about a potential slipped Nissen fundoplication.  He will obviously need further endoscopic work-up.  We will also need a barium swallow.  He had already had manometry at [facility] and we will await for final results.  Further evaluate his intra-abdominal abdominal me we will obtain a CT scan of the abdomen and pelvis. We will reevaluate him after the work-up is completed and determine whether or not he might benefit from we do hernia repair/antireflux procedure.  Extensive counseling provided Time spent with the  patient was 65  minutes, with more than 50% of the time spent in face-to-face education, counseling and care coordination.     Caroleen Hamman, MD FACS General Surgeon 09/21/2021, 3:31 PM

## 2021-09-23 ENCOUNTER — Other Ambulatory Visit: Payer: Self-pay

## 2021-09-23 ENCOUNTER — Ambulatory Visit
Admission: RE | Admit: 2021-09-23 | Discharge: 2021-09-23 | Disposition: A | Discharge status: Home / Self Care | Payer: Medicare Other | Source: Ambulatory Visit | Attending: Surgery | Admitting: Surgery

## 2021-09-23 DIAGNOSIS — R1084 Generalized abdominal pain: Secondary | ICD-10-CM

## 2021-09-23 DIAGNOSIS — K449 Diaphragmatic hernia without obstruction or gangrene: Secondary | ICD-10-CM

## 2021-09-29 ENCOUNTER — Other Ambulatory Visit: Payer: Self-pay

## 2021-09-29 ENCOUNTER — Ambulatory Visit
Admission: RE | Admit: 2021-09-29 | Discharge: 2021-09-29 | Disposition: A | Discharge status: Home / Self Care | Payer: Medicare Other | Source: Ambulatory Visit | Attending: Surgery | Admitting: Surgery

## 2021-09-29 DIAGNOSIS — K449 Diaphragmatic hernia without obstruction or gangrene: Secondary | ICD-10-CM | POA: Diagnosis present

## 2021-09-29 DIAGNOSIS — R1084 Generalized abdominal pain: Secondary | ICD-10-CM | POA: Insufficient documentation

## 2021-09-30 ENCOUNTER — Ambulatory Visit
Admission: RE | Admit: 2021-09-30 | Discharge: 2021-09-30 | Disposition: A | Discharge status: Home / Self Care | Payer: Medicare Other | Source: Ambulatory Visit | Attending: Surgery | Admitting: Surgery

## 2021-09-30 DIAGNOSIS — R1084 Generalized abdominal pain: Secondary | ICD-10-CM | POA: Diagnosis not present

## 2021-09-30 DIAGNOSIS — K449 Diaphragmatic hernia without obstruction or gangrene: Secondary | ICD-10-CM

## 2021-09-30 LAB — POCT I-STAT CREATININE: Creatinine, Ser: 1 mg/dL (ref 0.61–1.24)

## 2021-09-30 MED ORDER — IOHEXOL 350 MG/ML SOLN
100.0000 mL | Freq: Once | INTRAVENOUS | Status: AC | PRN
  Administered 2021-09-30: 100 mL via INTRAVENOUS

## 2021-10-02 NOTE — Progress Notes (Signed)
10/02/21:  CT scan abdomen/pelvis and Barium swallow studies personally viewed.  The patient does have a stable small hiatal hernia compared to prior CT chest in 01/2021.  However, on barium swallow, he does have mild reflux noted.  Patient has a new patient appointment with Dr. [name] with GI on 10/29/21 to continue the workup for his hiatal hernia.  Will then set up follow up with Dr. Dahlia Byes afterwards to discuss all results and further plans.  Olean Ree, MD

## 2021-10-04 LAB — ACID FAST CULTURE WITH REFLEXED SENSITIVITIES (MYCOBACTERIA): Acid Fast Culture: NEGATIVE

## 2021-10-13 ENCOUNTER — Other Ambulatory Visit: Payer: Self-pay | Admitting: Internal Medicine

## 2021-10-17 ENCOUNTER — Other Ambulatory Visit: Payer: Self-pay | Admitting: Internal Medicine

## 2021-10-26 ENCOUNTER — Other Ambulatory Visit: Payer: Self-pay

## 2021-10-26 ENCOUNTER — Ambulatory Visit (INDEPENDENT_AMBULATORY_CARE_PROVIDER_SITE_OTHER): Payer: Medicare Other | Admitting: Internal Medicine

## 2021-10-26 ENCOUNTER — Encounter: Payer: Self-pay | Admitting: Internal Medicine

## 2021-10-26 VITALS — BP 140/96 | HR 69 | Temp 96.2°F | Ht 74.0 in | Wt 216.0 lb

## 2021-10-26 DIAGNOSIS — G4452 New daily persistent headache (NDPH): Secondary | ICD-10-CM | POA: Diagnosis not present

## 2021-10-26 DIAGNOSIS — T508X5A Adverse effect of diagnostic agents, initial encounter: Secondary | ICD-10-CM

## 2021-10-26 DIAGNOSIS — C8581 Other specified types of non-Hodgkin lymphoma, lymph nodes of head, face, and neck: Secondary | ICD-10-CM

## 2021-10-26 DIAGNOSIS — I10 Essential (primary) hypertension: Secondary | ICD-10-CM | POA: Diagnosis not present

## 2021-10-26 DIAGNOSIS — I2510 Atherosclerotic heart disease of native coronary artery without angina pectoris: Secondary | ICD-10-CM | POA: Diagnosis not present

## 2021-10-26 DIAGNOSIS — Z23 Encounter for immunization: Secondary | ICD-10-CM

## 2021-10-26 MED ORDER — HYDROCHLOROTHIAZIDE 25 MG PO TABS: 25.0000 mg | ORAL_TABLET | Freq: Every day | ORAL | 3 refills | Status: DC

## 2021-10-26 MED ORDER — BUTALBITAL-APAP-CAFFEINE 50-325-40 MG PO TABS: 1.0000 | ORAL_TABLET | Freq: Two times a day (BID) | ORAL | 0 refills | Status: DC | PRN

## 2021-10-26 NOTE — Patient Instructions (Addendum)
Ok to add fioricet for headache pain .  Max tylenol dose 2000 mg daily   Increase hctz to 25 mg daily   for blood pressure   MRI brain and  eyeballs  ordered  I will call in the prednisone/benadryl regimen for your contrast allergy before your MRI

## 2021-10-26 NOTE — Progress Notes (Signed)
Subjective:  Patient ID: Brian Atkins, male    DOB: 08-02-1956  Age: 65 y.o. MRN: [id number]  CC: The primary encounter diagnosis was New daily persistent headache. 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 , 2) MALT ( lymphoma)  status post treatment with radiation in the past., 2) 2 COVID -19 infections,  most recently December 25, 2019 , treated with antibiotics for possible overlying PNA , 3) moderate OSA is on CPAP however admits to noncompliance who has as had chronic cough post Covid with intermittent expectoration of phlegm.4) previously seen by ENT for upper airway cough syndrome and status post septoplasty, 5) as significant GI history including GERD status post surgical correction with fundoplication ,   Had pulmonology evaluation recently with bronchoscopy for evaluation of  recurrent sinusitis.  Fungal and bacterial cultures negative. GERD damage seen .  Sent to Gen Surg,  [place]  ordered motility and pH sutides  and has referred to Lucilla Lame,  appt is Nov 10   ." pH study was normal " but he notes that he was having symptoms when the [facility] was 2 or 3 .  Currently Taking famotidine bid and 20 omeprazole at night.  Has symptoms of reflux 'ALL THE TIME"   Hypertension: patient  not checking blood pressure  at home.   Last two readings are high.  Taking 12.5 mg carvedilol ,  no amlodipine    Patient is following a reduce salt diet most days and is  taking medications as prescribed   RECENT  CT SCAN REVIEWED .  POST NISSEN FUNDUPLICATION.  SMALL HIATAL HERNIA SEEN   USING GAS X FOR BLOATING   HEADACHES:  getting worse. Now occuring daily . Left parietal occurring daily,  waking up fine but once up and going they are constant dull ache.  Using 1000 mg tylenol daily up to 1400  mg.   Has cervical degenerative  disk disease  has not tried MRs or muscle relaxer .   Placing pressure on left orbit increases his pain .  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  Tried Emgality which has helped but not resolved.    Outpatient Medications Prior to Visit  Medication Sig Dispense Refill   albuterol (VENTOLIN HFA) 108 (90 Base) MCG/ACT inhaler Inhale 2 puffs into the lungs every 6 (six) hours as needed for wheezing or shortness of breath.     carvedilol (COREG) 12.5 MG tablet Take 1 tablet (12.5 mg total) by mouth 2 (two) times daily. 180 tablet 3   cyanocobalamin (,VITAMIN B-12,) 1000 MCG/ML injection Inject 1 mL (1,000 mcg total) into the muscle every 30 (thirty) days. 10 mL 0   ezetimibe (ZETIA) 10 MG tablet TAKE 1 TABLET BY MOUTH EVERY DAY (Patient taking differently: Take 10 mg  by mouth daily.) 90 tablet 1   famotidine (PEPCID) 20 MG tablet Take 20 mg by mouth 2 (two) times daily.     losartan (COZAAR) 100 MG tablet TAKE ONE TABLET BY MOUTH ONE TIME DAILY 90 tablet 2   Multiple Vitamins-Minerals (MULTIVITAMIN WITH MINERALS) tablet Take 1 tablet by mouth daily.     omeprazole (PRILOSEC) 40 MG capsule Take 40 mg by mouth daily.     ONETOUCH VERIO test strip USE TO CHECK BLOOD SUGAR AS NEEDED 100 strip 2   prochlorperazine (COMPAZINE) 5 MG tablet Take 5 mg by mouth every 6 (six) hours as needed for nausea or vomiting.     Prucalopride Succinate (MOTEGRITY) 2 MG TABS Take 2 mg by mouth daily as needed  (constipation).     rOPINIRole (REQUIP) 0.25 MG tablet Take 0.25 mg by mouth in the morning and at bedtime.      rosuvastatin (CRESTOR) 20 MG tablet Take 20 mg by mouth daily.     Syringe/Needle, Disp, (SYRINGE 3CC/25GX1") 25G X 1" 3 ML MISC Use for b12 injections 50 each 0   triamcinolone (NASACORT) 55 MCG/ACT AERO nasal inhaler Place 2 sprays into the nose at bedtime.     zolpidem (AMBIEN CR) 12.5 MG CR tablet TAKE ONE TABLET BY MOUTH AT BEDTIME AS NEEDED FOR SLEEP 30 tablet 5   butalbital-acetaminophen-caffeine (FIORICET) 50-325-40 MG tablet Take 1 tablet by mouth every 4 (four) hours as needed for headache.     hydrochlorothiazide (MICROZIDE) 12.5 MG capsule TAKE ONE CAPSULE BY MOUTH ONE TIME DAILY 90 capsule 0   aspirin 81 MG chewable tablet Chew by mouth daily. (Patient not taking: Reported on 10/26/2021)     diphenhydrAMINE (BENADRYL) 50 MG tablet Take 1 tablet (50 mg total) by mouth once for 1 dose. Take 50 mg by mouth 1 hour prior to scan time 1 tablet 0   nitroGLYCERIN (NITROSTAT) 0.4 MG SL tablet Place 1 tablet (0.4 mg total) under the tongue every 5 (five) minutes as needed for chest pain. Maximum of 3 doses. 35 tablet 3   TRELEGY ELLIPTA 100-62.5-25 MCG/ACT AEPB Inhale 1 puff into the lungs daily.     amLODipine (NORVASC) 5 MG tablet Take 5 mg by mouth daily. (Patient not taking: Reported on 10/26/2021)     predniSONE (DELTASONE) 50 MG tablet Take 1 tablet 13 hours, 7 hours, and 1 hour prior to scan time. (Patient not taking: Reported on 10/26/2021) 3 tablet 0   No facility-administered medications prior to visit.    Review of Systems;  Patient denies headache, fevers, malaise, unintentional weight loss, skin rash, eye pain, sinus congestion and sinus pain, sore throat, dysphagia,  hemoptysis , cough, dyspnea, wheezing, chest pain, palpitations, orthopnea, edema, abdominal pain, nausea, melena, diarrhea, constipation, flank pain, dysuria, hematuria, urinary  Frequency, nocturia,  numbness, tingling, seizures,  Focal weakness, Loss of consciousness,  Tremor, insomnia, depression, anxiety, and suicidal ideation.      Objective:  BP (!) 140/96 (BP Location: Left Arm, Patient Position: Sitting, Cuff Size: Normal)   Pulse 69   Temp (!) 96.2 F (35.7 C) (Temporal)   Ht 6\' 2"  (1.88 m)   Wt 216 lb (98 kg)   SpO2 96%   BMI 27.73 kg/m   BP Readings from Last 3 Encounters:  10/26/21 (!) 140/96  09/14/21 (!) 145/91  08/21/21 122/85    Wt Readings from Last 3 Encounters:  10/26/21 216 lb (98 kg)  09/14/21 214 lb 6.4 oz (97.3 kg)  08/21/21  210 lb (95.3 kg)    General appearance: alert, cooperative and appears stated age Ears: normal TM's and external ear canals both ears Throat: lips, mucosa, and tongue normal; teeth and gums normal Neck: no adenopathy, no carotid bruit, supple, symmetrical, trachea midline and thyroid not enlarged, symmetric, no tenderness/mass/nodules Back: symmetric, no curvature. ROM normal. No CVA tenderness. Lungs: clear to auscultation bilaterally Heart: regular rate and rhythm, S1, S2 normal, no murmur, click, rub or gallop Abdomen: soft, non-tender; bowel sounds normal; no masses,  no organomegaly Pulses: 2+ and symmetric Skin: Skin color, texture, turgor normal. No rashes or lesions Lymph nodes: Cervical, supraclavicular, and axillary nodes normal.  Lab Results  Component Value Date   HGBA1C 6.0 09/23/2020   HGBA1C 5.8 03/10/2020   HGBA1C 5.9 03/13/2019    Lab Results  Component Value Date   CREATININE 1.00 09/30/2021   CREATININE 1.07 07/22/2021   CREATININE 1.11 01/30/2021    Lab Results  Component Value Date   WBC 7.6 08/19/2021   HGB 15.7 08/19/2021   HCT 46.1 08/19/2021   PLT 383 08/19/2021   GLUCOSE 107 (H) 07/22/2021   CHOL 147 07/22/2021   TRIG 112.0 07/22/2021   HDL 47.40 07/22/2021   LDLDIRECT 98.0 03/10/2020   LDLCALC 77 07/22/2021   ALT 26 07/22/2021   AST 19 07/22/2021   NA 139 07/22/2021   K 4.6  07/22/2021   CL 100 07/22/2021   CREATININE 1.00 09/30/2021   BUN 17 07/22/2021   CO2 28 07/22/2021   TSH 2.18 09/23/2020   PSA 3.11 10/24/2020   INR 1.0 08/19/2021   HGBA1C 6.0 09/23/2020   MICROALBUR 0.5 10/24/2020    CT Abdomen Pelvis W Contrast  Result Date: 10/01/2021 CLINICAL DATA:  Generalized abdominal pain. Hiatal hernia suspected. Having more acid reflux. EXAM: CT ABDOMEN AND PELVIS WITH CONTRAST TECHNIQUE: Multidetector CT imaging of the abdomen and pelvis was performed using the standard protocol following bolus administration of intravenous contrast. CONTRAST:  15mL OMNIPAQUE IOHEXOL 350 MG/ML SOLN COMPARISON:  05/03/2014 and esophagram 09/29/2021 and chest CT 02/06/2021 FINDINGS: Lower chest: Volume loss in left lower lobe. Otherwise, the lung bases are clear. Hepatobiliary: 2.3 cm cyst at the hepatic dome. At least 1 additional subtle low-density area in right hepatic lobe that is too small to definitively characterize but was probably present on the previous abdominal CT examination. No suspicious hepatic lesions. Normal appearance of the gallbladder. Portal venous system is patent. No biliary dilatation. Pancreas: Unremarkable. No pancreatic ductal dilatation or surrounding inflammatory changes. Spleen: Status post splenectomy Adrenals/Urinary Tract: Normal adrenal glands. Multiple small right renal cysts. Normal appearance of the urinary bladder. Probable small left renal cysts. Evidence for at least 2 stones in left kidney largest measures 3 mm. Negative for hydronephrosis. Stomach/Bowel: High-density barium within the colon. Evidence for normal appendix without inflammatory changes. Mild small bowel distension. Postoperative changes compatible with a Nissen fundoplication. Compared to the exam in 2015, there has been a change at the fundoplication with a small hiatal hernia. Surgical clips are closer to the esophageal hiatus compared to 2015. This finding is best seen on the  coronal formats, sequence 5 image 60. However, the appearance of the distal esophagus and proximal stomach are similar to the chest CT from 02/06/2021. No inflammatory changes around the distal esophagus or GE junction. Vascular/Lymphatic: No significant vascular findings are present. No enlarged abdominal or pelvic lymph nodes. Reproductive: Prostate is enlarged measuring 5.1 cm in transverse dimension. Other: Negative for ascites.  Negative  for free air. Musculoskeletal: Sclerotic density along the right side of the sacrum has minimally enlarged since 2015. Disc space narrowing at L5-S1. IMPRESSION: 1. Small hiatal hernia associated with the Nissen fundoplication. This has minimally changed since chest CT on 02/06/2021 but the small hernia is new since 2015. No inflammatory changes around the GE junction. 2. No acute abnormality in the abdomen or pelvis. 3. Hepatic and renal cysts. 4. Nonobstructive left renal calculi. 5. Prostate enlargement. Electronically Signed   By: Markus Daft M.D.   On: 10/01/2021 17:33    Assessment & Plan:   Problem List Items Addressed This Visit     Essential hypertension    Not at goal with last 2 office readings on carvedilol and hctz.  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  S/p splenectomy, chemo /XRT .  Has had multiple complications including splenic vein thrombosis requiring Lovenox for 6 months,  Angioedema post XRT requiring intubation, and SB delayed emptying (stricture?) by recent GI workup.  Concern for spread given new onset headache provoked by  pressure on left orbit   MRi planned.        Relevant Medications   butalbital-acetaminophen-caffeine (FIORICET) 50-325-40 MG tablet   predniSONE (DELTASONE) 50 MG tablet   Other Relevant Orders   MR ORBITS W WO CONTRAST   MR Brain W Wo Contrast   New persistent daily headache    Involving the left side of head in the temporoparietal regions.  He notes that the headache is aggravated by putting pressure on his left eye, the site of his previous lymphoma.  Last MRI of brain and orbits was Feb 2020.  Will plan to repeat prior to his oncology follow up in December.       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 He has been pretreated successfully in the past with prophylactic doses of oral prednisone and dipenhydramine ( 50 mg prednisone 13  7 and 1 hour prior to procedure,  And 50 mg benadryl 1 hour prior to procedure).  Prednisone tx will be sent to pharmacy to use prior to the planned MRIS of orbits  and brain       Other Visit Diagnoses     New daily persistent headache    -  Primary   Relevant Medications   butalbital-acetaminophen-caffeine (FIORICET) 50-325-40 MG tablet   predniSONE (DELTASONE) 50 MG tablet   Other Relevant Orders   MR ORBITS W WO CONTRAST   MR Brain W Wo Contrast   Need for immunization against influenza       Relevant Orders   Flu Vaccine QUAD High Dose(Fluad) (Completed)       I have discontinued Cristopher Estimable. Dulski's amLODipine, predniSONE, and hydrochlorothiazide. I have also changed his butalbital-acetaminophen-caffeine. Additionally, I am having him start  on hydrochlorothiazide and predniSONE. Lastly, I am having him maintain his nitroGLYCERIN, SYRINGE 3CC/25GX1", OneTouch Verio, Motegrity, prochlorperazine, rOPINIRole, carvedilol,  omeprazole, albuterol, ezetimibe, famotidine, rosuvastatin, triamcinolone, multivitamin with minerals, zolpidem, aspirin, diphenhydrAMINE, cyanocobalamin, losartan, and Trelegy Ellipta.  Meds ordered this encounter  Medications   hydrochlorothiazide (HYDRODIURIL) 25 MG tablet    Sig: Take 1 tablet (25 mg total) by mouth daily.    Dispense:  90 tablet    Refill:  3   butalbital-acetaminophen-caffeine (FIORICET) 50-325-40 MG tablet    Sig: Take 1 tablet by mouth 2 (two) times daily as needed for headache or migraine.    Dispense:  60 tablet    Refill:  0   predniSONE (DELTASONE) 50 MG tablet    Sig: 1 tablet 13 hours before the procedure, 2nd dose 5 hours  Before procedure  and 3rd dose 1 hour before procedure.    Dispense:  3 tablet    Refill:  0    Medications Discontinued During This Encounter  Medication Reason   predniSONE (DELTASONE) 50 MG tablet    amLODipine (NORVASC) 5 MG tablet    hydrochlorothiazide (MICROZIDE) 12.5 MG capsule    butalbital-acetaminophen-caffeine (FIORICET) 50-325-40 MG tablet Reorder    Follow-up: No follow-ups on file.   [name], MD

## 2021-10-27 ENCOUNTER — Telehealth: Payer: Self-pay | Admitting: Internal Medicine

## 2021-10-27 DIAGNOSIS — T508X5A Adverse effect of diagnostic agents, initial encounter: Secondary | ICD-10-CM | POA: Insufficient documentation

## 2021-10-27 MED ORDER — PREDNISONE 50 MG PO TABS: ORAL_TABLET | ORAL | 0 refills | Status: DC

## 2021-10-27 NOTE — Assessment & Plan Note (Signed)
Involving the left side of head in the temporoparietal regions.  He notes that the headache is aggravated by putting pressure on his left eye, the site of his previous lymphoma.  Last MRI of brain and orbits was Feb 2020.  Will plan to repeat prior to his oncology follow up in December.

## 2021-10-27 NOTE — Assessment & Plan Note (Signed)
Not at goal with last 2 office readings on carvedilol and hctz.  Increase hctz to 25 mg daily  Lab Results  Component Value Date   CREATININE 1.00 09/30/2021   Lab Results  Component Value Date   NA 139 07/22/2021   K 4.6 07/22/2021   CL 100 07/22/2021   CO2 28 07/22/2021

## 2021-10-27 NOTE — Assessment & Plan Note (Signed)
Involving both orbits and spleen, diagnosed  left orbital mass biopsy June 12 at [facility].  S/p splenectomy, chemo /XRT .  Has had multiple complications including splenic vein thrombosis requiring Lovenox for 6 months,  Angioedema post XRT requiring intubation, and SB delayed emptying (stricture?) by recent GI workup.  Concern for spread given new onset headache provoked by pressure on left orbit   MRi planned.

## 2021-10-27 NOTE — Telephone Encounter (Signed)
Lft pt vm to call ofc to sch MRI's. thanks ?

## 2021-10-27 NOTE — Assessment & Plan Note (Signed)
Patient has a history of facial swelling as a reaction to gadolinium . He has been pretreated successfully in the past with prophylactic doses of oral prednisone and dipenhydramine ( 50 mg prednisone 13  7 and 1 hour prior to procedure,  And 50 mg benadryl 1 hour prior to procedure).  Prednisone tx will be sent to pharmacy to use prior to the planned MRIS of orbits  and brain

## 2021-10-29 ENCOUNTER — Encounter: Payer: Self-pay | Admitting: Gastroenterology

## 2021-10-29 ENCOUNTER — Ambulatory Visit (INDEPENDENT_AMBULATORY_CARE_PROVIDER_SITE_OTHER): Payer: Medicare Other | Admitting: Gastroenterology

## 2021-10-29 ENCOUNTER — Other Ambulatory Visit: Payer: Self-pay

## 2021-10-29 VITALS — BP 135/86 | HR 76 | Temp 97.0°F | Ht 74.0 in | Wt 217.2 lb

## 2021-10-29 DIAGNOSIS — K219 Gastro-esophageal reflux disease without esophagitis: Secondary | ICD-10-CM

## 2021-10-29 NOTE — Progress Notes (Signed)
Gastroenterology Consultation  Referring Provider:     Jules Husbands, MD Primary Care Physician:  [name], MD Primary Gastroenterologist:  Dr. [name]     Reason for Consultation:     Generalized abdominal pain        HPI:   Brian Atkins is a 65 y.o. y/o male referred for consultation & management of Generalized abdominal pain by Dr. Derrel Nip, Aris Everts, MD.  This patient comes to see me after being followed at [facility] by GI there.  The patient was last seen 3-1/2 months ago there. The patient has been seen for dyspepsia, GERD, constipation, nausea and vomiting and bacterial overgrowth Dating back to 2019 with [facility]. The most recent recommendations by [facility] were:   This is a 65 y.o. male who endorses heartburn, regurgitation and intermittent cervical dysphagia; despite daily PPI, prn Pepcid and prior Nissen fundoplication. Prior endoscopy (in 2019) revealed gastric inlet patches. Would recommend 24 pH impedance monitoring to assess for both acidic and nonacidic reflux and its correlation to symptoms.  The patient did undergo impedance studying a little over a month ago that showed:                                                                                  Impression:            - Control of the distal esophageal acid was excellent.                         - Control of the gastric acid was excellent.                         - Normal absolute number of gastroesophageal reflux                         events on PPI once daily and H2RA twice daily.                         - Negative symptom correlation for all symptoms.   The manometry was also done at that time and was reported as:  Impression:            - Impaired relaxation of the upper esophageal sphincter.                         - The esophageal manometry was otherwise normal.    The patient's previous gastroenterologist had recommended these procedures and to return in 10 weeks after his last visit.  The recommendations of the  24-hour pH probe and manometry both recommended follow-up with his primary Gastroenterologist.  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 The patient was seen by surgery and a CT scan was done that showed:  IMPRESSION: 1. Small hiatal hernia associated with the Nissen fundoplication. This has minimally changed since chest CT on 02/06/2021 but the small hernia is new since 2015. No inflammatory changes around the GE junction. 2. No acute  abnormality in the abdomen or pelvis. 3. Hepatic and renal cysts. 4. Nonobstructive left renal calculi. 5. Prostate enlargement.  Due to consideration for possible hiatal hernia revision the patient was sent to me for evaluation.  The patient reports that he has to take Maalox and multiple other things throughout the day due to his symptoms of reflux.  The patient also reports that he was seen by pulmonology and had lavages done of his sputum and was told that the findings were consistent with him having acid reflux.  Past Medical History:  Diagnosis Date   Complication of anesthesia    slow to wake   Coronary artery disease, non-occlusive    a. LHC 12/18: ostLAD 20%, p/mLAD 60% FFR 0.84, ost ramus 50%, mRCA 50% FFR 0.94, EF 65%   COVID-19 virus infection 12/28/2019   Diverticulitis    Diverticulosis    Dyslipidemia    Dyspnea    Family history of adverse reaction to anesthesia    Mother - slow to wake   GERD (gastroesophageal reflux disease)    Hemorrhoids    Hiatal hernia    History of 2019 novel coronavirus disease (COVID-19) 12/24/2019   History of echocardiogram    a. TTE 1/19: EF of 60-65%, normal wall motion, normal LV diastolic function, mildly dilated LA   History of kidney stones    Labile hypertension    Lymphoma (HCC)    Medial epicondylitis of right elbow    Meralgia paresthetica of right side    Microscopic hematuria    Ocular migraine    Pneumonia    Sleep apnea     CPAP    Past Surgical History:  Procedure Laterality Date   BRONCHIAL WASHINGS N/A 08/21/2021   Procedure: BRONCHIAL WASHINGS;  Surgeon: Ottie Glazier, MD;  Location: [facility];  Service: Thoracic;  Laterality: N/A;   COLONOSCOPY     FLEXIBLE BRONCHOSCOPY N/A 08/21/2021   Procedure: FLEXIBLE BRONCHOSCOPY;  Surgeon: Ottie Glazier, MD;  Location: [facility];  Service: Thoracic;  Laterality: N/A;   INTRAVASCULAR PRESSURE WIRE/FFR STUDY N/A 12/07/2017   Procedure: INTRAVASCULAR PRESSURE WIRE/FFR STUDY;  Surgeon: Nelva Bush, MD;  Location: [place] [facility];  Service: Cardiovascular;  Laterality: N/A;   LEFT HEART CATH AND CORONARY ANGIOGRAPHY Left 12/07/2017   Procedure: LEFT HEART CATH AND CORONARY ANGIOGRAPHY;  Surgeon: Nelva Bush, MD;  Location: [place] [facility];  Service: Cardiovascular;  Laterality: Left;   nissen funduplication  [id number]   [name]   SEPTOPLASTY Bilateral 08/23/2019   Procedure: SEPTOPLASTY;  Surgeon: [name], MD;  Location: [place];  Service: ENT;  Laterality: Bilateral;   SPLENECTOMY     TURBINATE REDUCTION Bilateral 08/23/2019   Procedure: TURBINATE REDUCTION;  Surgeon: [name], MD;  Location: [place];  Service: ENT;  Laterality: Bilateral;  Latex sensitivity sleep apnea    Prior to Admission medications   Medication Sig Start Date End Date Taking? Authorizing Provider  albuterol (VENTOLIN HFA) 108 (90 Base) MCG/ACT inhaler Inhale 2 puffs into the lungs every 6 (six) hours as needed for wheezing or shortness of breath. 03/26/21   [provider]  aspirin 81 MG chewable tablet Chew by mouth daily. Patient not taking: Reported on 10/26/2021    [provider]  butalbital-acetaminophen-caffeine (FIORICET) 50-325-40 MG tablet Take 1 tablet by mouth 2 (two) times daily as needed for headache or migraine. 10/26/21   [name], MD  carvedilol (COREG) 12.5 MG tablet Take 1 tablet (12.5 mg total)  by  mouth 2 (two) times daily. 12/17/20 12/12/21  End, Harrell Gave, MD  cyanocobalamin (,VITAMIN B-12,) 1000 MCG/ML injection Inject 1 mL (1,000 mcg total) into the muscle every 30 (thirty) days. 10/13/21   [name], MD  diphenhydrAMINE (BENADRYL) 50 MG tablet Take 1 tablet (50 mg total) by mouth once for 1 dose. Take 50 mg by mouth 1 hour prior to scan time 09/15/21 09/15/21  Caroleen Hamman F, MD  ezetimibe (ZETIA) 10 MG tablet TAKE 1 TABLET BY MOUTH EVERY DAY Patient taking differently: Take 10 mg by mouth daily. 05/21/21   Dunn, Areta Haber, PA-C  famotidine (PEPCID) 20 MG tablet Take 20 mg by mouth 2 (two) times daily.    [provider]  hydrochlorothiazide (HYDRODIURIL) 25 MG tablet Take 1 tablet (25 mg total) by mouth daily. 10/26/21   [name], MD  losartan (COZAAR) 100 MG tablet TAKE ONE TABLET BY MOUTH ONE TIME DAILY 10/20/21   [name], MD  Multiple Vitamins-Minerals (MULTIVITAMIN WITH MINERALS) tablet Take 1 tablet by mouth daily.    [provider]  nitroGLYCERIN (NITROSTAT) 0.4 MG SL tablet Place 1 tablet (0.4 mg total) under the tongue every 5 (five) minutes as needed for chest pain. Maximum of 3 doses. 11/30/17 03/06/21  End, Harrell Gave, MD  omeprazole (PRILOSEC) 40 MG capsule Take 40 mg by mouth daily. 02/17/21   [provider]  ONETOUCH VERIO test strip USE TO CHECK BLOOD SUGAR AS NEEDED 06/18/19   [name], MD  predniSONE (DELTASONE) 50 MG tablet 1 tablet 13 hours before the procedure, 2nd dose 5 hours  Before procedure  and 3rd dose 1 hour before procedure. 10/27/21   [name], MD  prochlorperazine (COMPAZINE) 5 MG tablet Take 5 mg by mouth every 6 (six) hours as needed for nausea or vomiting.    [provider]  Prucalopride Succinate (MOTEGRITY) 2 MG TABS Take 2 mg by mouth daily as needed (constipation).    [provider]  rOPINIRole (REQUIP) 0.25 MG tablet Take 0.25 mg by mouth in the morning and at bedtime.  03/26/20    [provider]  rosuvastatin (CRESTOR) 20 MG tablet Take 20 mg by mouth daily.    [provider]  Syringe/Needle, Disp, (SYRINGE 3CC/25GX1") 25G X 1" 3 ML MISC Use for b12 injections 03/15/18   [name], MD  TRELEGY ELLIPTA 100-62.5-25 MCG/ACT AEPB Inhale 1 puff into the lungs daily. 10/13/21   [provider]  triamcinolone (NASACORT) 55 MCG/ACT AERO nasal inhaler Place 2 sprays into the nose at bedtime.    [provider]  zolpidem (AMBIEN CR) 12.5 MG CR tablet TAKE ONE TABLET BY MOUTH AT BEDTIME AS NEEDED FOR SLEEP 08/28/21   [name], MD    Family History  Problem Relation Age of Onset   Coronary artery disease Father 37       CABG   Aortic aneurysm Father 52       repaired   Hyperlipidemia Father    Lung cancer Mother    Hypertension Mother    Cancer Mother        bladder   COPD Mother    Stomach cancer Maternal Grandfather      Social History   Tobacco Use   Smoking status: Never    Passive exposure: Past   Smokeless tobacco: Never  Vaping Use   Vaping Use: Never used  Substance Use Topics   Alcohol use: No  Drug use: No    Allergies as of 10/29/2021 - Review Complete 10/26/2021  Allergen Reaction Noted   Avelox [moxifloxacin] Hives and Swelling 04/30/2014   Gadolinium derivatives Swelling 11/30/2017   Contrast media [iodinated diagnostic agents] Swelling 11/30/2017   Crestor [rosuvastatin calcium]  10/25/2011   Morphine and related Itching 10/25/2011   Niacin  10/25/2011   Niacin-simvastatin er  10/25/2011   Latex Rash 08/20/2019   Tape Dermatitis 09/03/2016   Tapentadol Dermatitis 09/03/2016   Wound dressing adhesive Dermatitis 09/03/2016    Review of Systems:    All systems reviewed and negative except where noted in HPI.   Physical Exam:  There were no vitals taken for this visit. No LMP for male patient. General:   Alert,  Well-developed, well-nourished, pleasant and cooperative in NAD Head:   Normocephalic and atraumatic. Eyes:  Sclera clear, no icterus.   Conjunctiva pink. Ears:  Normal auditory acuity. Neurologic:  Alert and oriented x3;  grossly normal neurologically. Skin:  Intact without significant lesions or rashes.  No jaundice. Psych:  Alert and cooperative. Normal mood and affect.  Imaging Studies: CT Abdomen Pelvis W Contrast  Result Date: 10/01/2021 CLINICAL DATA:  Generalized abdominal pain. Hiatal hernia suspected. Having more acid reflux. EXAM: CT ABDOMEN AND PELVIS WITH CONTRAST TECHNIQUE: Multidetector CT imaging of the abdomen and pelvis was performed using the standard protocol following bolus administration of intravenous contrast. CONTRAST:  157m OMNIPAQUE IOHEXOL 350 MG/ML SOLN COMPARISON:  05/03/2014 and esophagram 09/29/2021 and chest CT 02/06/2021 FINDINGS: Lower chest: Volume loss in left lower lobe. Otherwise, the lung bases are clear. Hepatobiliary: 2.3 cm cyst at the hepatic dome. At least 1 additional subtle low-density area in right hepatic lobe that is too small to definitively characterize but was probably present on the previous abdominal CT examination. No suspicious hepatic lesions. Normal appearance of the gallbladder. Portal venous system is patent. No biliary dilatation. Pancreas: Unremarkable. No pancreatic ductal dilatation or surrounding inflammatory changes. Spleen: Status post splenectomy Adrenals/Urinary Tract: Normal adrenal glands. Multiple small right renal cysts. Normal appearance of the urinary bladder. Probable small left renal cysts. Evidence for at least 2 stones in left kidney largest measures 3 mm. Negative for hydronephrosis. Stomach/Bowel: High-density barium within the colon. Evidence for normal appendix without inflammatory changes. Mild small bowel distension. Postoperative changes compatible with a Nissen fundoplication. Compared to the exam in 2015, there has been a change at the fundoplication with a small hiatal hernia. Surgical  clips are closer to the esophageal hiatus compared to 2015. This finding is best seen on the coronal formats, sequence 5 image 60. However, the appearance of the distal esophagus and proximal stomach are similar to the chest CT from 02/06/2021. No inflammatory changes around the distal esophagus or GE junction. Vascular/Lymphatic: No significant vascular findings are present. No enlarged abdominal or pelvic lymph nodes. Reproductive: Prostate is enlarged measuring 5.1 cm in transverse dimension. Other: Negative for ascites.  Negative for free air. Musculoskeletal: Sclerotic density along the right side of the sacrum has minimally enlarged since 2015. Disc space narrowing at L5-S1. IMPRESSION: 1. Small hiatal hernia associated with the Nissen fundoplication. This has minimally changed since chest CT on 02/06/2021 but the small hernia is new since 2015. No inflammatory changes around the GE junction. 2. No acute abnormality in the abdomen or pelvis. 3. Hepatic and renal cysts. 4. Nonobstructive left renal calculi. 5. Prostate enlargement. Electronically Signed   By: AMarkus DaftM.D.   On: 10/01/2021 17:33   DG  ESOPHAGUS W SINGLE CM (SOL OR THIN BA)  Result Date: 09/29/2021 CLINICAL DATA:  Hiatal hernia, acid reflux EXAM: ESOPHAGUS/BARIUM SWALLOW/TABLET STUDY TECHNIQUE: Combined double and single contrast examination was performed using effervescent crystals, high-density barium, and thin liquid barium. This exam was performed by Tsosie Billing, and was supervised and interpreted by Valetta Mole. FLUOROSCOPY TIME:  Radiation Exposure Index (as provided by the fluoroscopic device): 47 mGy Fluoroscopy Time:  3.2 minutes Number of Acquired Images:  4 COMPARISON:  CT chest 02/06/2021 FINDINGS: Swallowing: Appears normal. No vestibular penetration or aspiration seen. Pharynx: Unremarkable. Esophagus: There were no strictures or mucosal abnormalities identified. Esophageal motility: Within normal limits. Hiatal Hernia:  Small hiatal hernia was identified. Gastroesophageal reflux: There was mild gastroesophageal reflux with provocative maneuvers. Ingested 40m barium tablet: Passed normally. Other: None. IMPRESSION: 1. Small hiatal hernia. 2. Mild gastroesophageal reflux with provocative maneuvers. Electronically Signed   By: PValetta MoleM.D.   On: 09/29/2021 11:05    Assessment and Plan:   Brian HOOTONis a 65y.o. y/o male who comes in today with a history of reflux after having a Nissen fundoplication.  [facility] and manometry with impedance.  The studies were all normal except for some question of relaxation of the upper esophageal sphincter.  The patient has been told with all these findings it is unlikely that revision of his antireflux surgery would be helpful due to the impedance showing a normal number of reflux episodes in addition to a negative pH study and no dysmotility except for the previously mentioned upper esophageal sphincter issues. The patient denies any dysphagia or any symptoms of the upper esophagus at the present time.  The patient has had the previous workup reviewed for him including my interpretation of them and my impression.  He has been encouraged to follow up with Dr. SAdria Devonwho ordered of the tests for any further recommendations.  I have told the patient that I have nothing to offer him at this time with his normal studies and continued symptoms.  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  The patient and his wife have been explained the plan and agree with it.   More than 75% of the encounter was discussing the patient's results and tests with answering explaining questions the patient.   DLucilla Lame MD. FMarval Regal   Note: This dictation was prepared with Dragon dictation along with smaller phrase technology. Any  transcriptional errors that result from this process are unintentional.

## 2021-11-06 ENCOUNTER — Other Ambulatory Visit: Payer: Self-pay

## 2021-11-06 ENCOUNTER — Ambulatory Visit
Admission: RE | Admit: 2021-11-06 | Discharge: 2021-11-06 | Disposition: A | Discharge status: Home / Self Care | Payer: Medicare Other | Source: Ambulatory Visit | Attending: Internal Medicine | Admitting: Internal Medicine

## 2021-11-06 DIAGNOSIS — G4452 New daily persistent headache (NDPH): Secondary | ICD-10-CM | POA: Diagnosis present

## 2021-11-06 DIAGNOSIS — C8581 Other specified types of non-Hodgkin lymphoma, lymph nodes of head, face, and neck: Secondary | ICD-10-CM | POA: Insufficient documentation

## 2021-11-06 MED ORDER — GADOBUTROL 1 MMOL/ML IV SOLN
7.5000 mL | Freq: Once | INTRAVENOUS | Status: AC | PRN
  Administered 2021-11-06: 7.5 mL via INTRAVENOUS

## 2021-11-16 ENCOUNTER — Telehealth: Payer: Self-pay | Admitting: Internal Medicine

## 2021-11-16 NOTE — Telephone Encounter (Signed)
Pt was transfer back to office from access nurse to schedule an 24 hour appt. Schedule Pt with NP Dutch Quint on 11/17/2021 at 1:45pm.

## 2021-11-16 NOTE — Telephone Encounter (Signed)
Pt called in complaining that he is still having symptoms of severe headaches and dizziness. Pt stated that last week he had an MRI done but he is still having symptoms. Transfer pt to access nurse.

## 2021-11-17 ENCOUNTER — Ambulatory Visit (INDEPENDENT_AMBULATORY_CARE_PROVIDER_SITE_OTHER): Payer: Medicare Other

## 2021-11-17 ENCOUNTER — Ambulatory Visit (INDEPENDENT_AMBULATORY_CARE_PROVIDER_SITE_OTHER): Payer: Medicare Other | Admitting: Family

## 2021-11-17 ENCOUNTER — Other Ambulatory Visit: Payer: Self-pay

## 2021-11-17 ENCOUNTER — Encounter: Payer: Self-pay | Admitting: Family

## 2021-11-17 VITALS — BP 125/70 | HR 61 | Temp 97.7°F | Ht 74.0 in | Wt 218.4 lb

## 2021-11-17 DIAGNOSIS — I2510 Atherosclerotic heart disease of native coronary artery without angina pectoris: Secondary | ICD-10-CM

## 2021-11-17 DIAGNOSIS — M436 Torticollis: Secondary | ICD-10-CM

## 2021-11-17 DIAGNOSIS — R519 Headache, unspecified: Secondary | ICD-10-CM

## 2021-11-17 DIAGNOSIS — R42 Dizziness and giddiness: Secondary | ICD-10-CM

## 2021-11-17 DIAGNOSIS — M5481 Occipital neuralgia: Secondary | ICD-10-CM | POA: Diagnosis not present

## 2021-11-17 MED ORDER — MOLNUPIRAVIR EUA 200MG CAPSULE: 4.0000 | ORAL_CAPSULE | Freq: Two times a day (BID) | ORAL | 0 refills | Status: DC

## 2021-11-17 NOTE — Progress Notes (Signed)
Acute Office Visit  Subjective:    Patient ID: Brian Atkins, male    DOB: [birth year], 65 y.o.   MRN: [id number]  Chief Complaint  Patient presents with  . Acute Visit    Severe h/a dizziness    HPI Patient is in today with persistent complaints of headaches and dizziness x several months off and on. The headache is nearly daily. He saw Dr. Derrel Nip regarding the headache who ordered a MRI that was normal. Fioricet has been helpful. Also takes Tylenol and advil. Describes the pain as tension in the back of his head and neck. Hears a popping and cracking in his neck when moving side to side. Patient also reports having episodes of dizziness that is improved when he sits up. Has a history of Ocular headaches. Does not drink alcohol nor smoke. Denies increased stress. No blurred vision or double vision.   Past Medical History:  Diagnosis Date  . Complication of anesthesia    slow to wake  . Coronary artery disease, non-occlusive    a. LHC 12/18: ostLAD 20%, p/mLAD 60% FFR 0.84, ost ramus 50%, mRCA 50% FFR 0.94, EF 65%  . COVID-19 virus infection 12/28/2019  . Diverticulitis   . Diverticulosis   . Dyslipidemia   . Dyspnea   . Family history of adverse reaction to anesthesia    Mother - slow to wake  . GERD (gastroesophageal reflux disease)   . Hemorrhoids   . Hiatal hernia   . History of 2019 novel coronavirus disease (COVID-19) 12/24/2019  . History of echocardiogram    a. TTE 1/19: EF of 60-65%, normal wall motion, normal LV diastolic function, mildly dilated LA  . History of kidney stones   . Labile hypertension   . Lymphoma ([place])   . Medial epicondylitis of right elbow   . Meralgia paresthetica of right side   . Microscopic hematuria   . Ocular migraine   . Pneumonia   . Sleep apnea    CPAP    Past Surgical History:  Procedure Laterality Date  . BRONCHIAL WASHINGS N/A 08/21/2021   Procedure: BRONCHIAL WASHINGS;  Surgeon: Ottie Glazier, MD;  Location: [facility];  Service:  Thoracic;  Laterality: N/A;  . COLONOSCOPY    . FLEXIBLE BRONCHOSCOPY N/A 08/21/2021   Procedure: FLEXIBLE BRONCHOSCOPY;  Surgeon: Ottie Glazier, MD;  Location: [facility];  Service: Thoracic;  Laterality: N/A;  . INTRAVASCULAR PRESSURE WIRE/FFR STUDY N/A 12/07/2017   Procedure: INTRAVASCULAR PRESSURE WIRE/FFR STUDY;  Surgeon: Nelva Bush, MD;  Location: [facility];  Service: Cardiovascular;  Laterality: N/A;  . LEFT HEART CATH AND CORONARY ANGIOGRAPHY Left 12/07/2017   Procedure: LEFT HEART CATH AND CORONARY ANGIOGRAPHY;  Surgeon: Nelva Bush, MD;  Location: [facility];  Service: Cardiovascular;  Laterality: Left;  . nissen funduplication  2025   [name]  . SEPTOPLASTY Bilateral 08/23/2019   Procedure: SEPTOPLASTY;  Surgeon: [name], MD;  Location: [facility];  Service: ENT;  Laterality: Bilateral;  . SPLENECTOMY    . TURBINATE REDUCTION Bilateral 08/23/2019   Procedure: TURBINATE REDUCTION;  Surgeon: [name], MD;  Location: [place];  Service: ENT;  Laterality: Bilateral;  Latex sensitivity sleep apnea    Family History  Problem Relation Age of Onset  . Coronary artery disease Father 37       CABG  . Aortic aneurysm Father 83       repaired  . Hyperlipidemia Father   . Lung cancer Mother   .  Hypertension Mother   . Cancer Mother        bladder  . COPD Mother   . Stomach cancer Maternal Grandfather     Social History   Socioeconomic History  . Marital status: Married    Spouse name: Not on file  . Number of children: 2  . Years of education: 32  . Highest education level: Not on file  Occupational History  . Occupation: Shop Printmaker: [employer]    Comment: [employer]  Tobacco Use  . Smoking status: Never    Passive exposure: Past  . Smokeless tobacco: Never  Vaping Use  . Vaping Use: Never used  Substance and Sexual Activity  . Alcohol use: No  . Drug use: No  . Sexual activity: Yes   Other Topics Concern  . Not on file  Social History Narrative  . Not on file   Social Determinants of Health   Financial Resource Strain: Not on file  Food Insecurity: Not on file  Transportation Needs: Not on file  Physical Activity: Not on file  Stress: Not on file  Social Connections: Not on file  Intimate Partner Violence: Not on file    Outpatient Medications Prior to Visit  Medication Sig Dispense Refill  . albuterol (VENTOLIN HFA) 108 (90 Base) MCG/ACT inhaler Inhale 2 puffs into the lungs every 6 (six) hours as needed for wheezing or shortness of breath.    [name] aspirin 81 MG chewable tablet Chew by mouth daily.    . butalbital-acetaminophen-caffeine (FIORICET) 50-325-40 MG tablet Take 1 tablet by mouth 2 (two) times daily as needed for headache or migraine. 60 tablet 0  . carvedilol (COREG) 12.5 MG tablet Take 1 tablet (12.5 mg total) by mouth 2 (two) times daily. 180 tablet 3  . cyanocobalamin (,VITAMIN B-12,) 1000 MCG/ML injection Inject 1 mL (1,000 mcg total) into the muscle every 30 (thirty) days. 10 mL 0  . ezetimibe (ZETIA) 10 MG tablet TAKE 1 TABLET BY MOUTH EVERY DAY (Patient taking differently: Take 10 mg by mouth daily.) 90 tablet 1  . famotidine (PEPCID) 20 MG tablet Take 20 mg by mouth 2 (two) times daily.    . hydrochlorothiazide (HYDRODIURIL) 25 MG tablet Take 1 tablet (25 mg total) by mouth daily. 90 tablet 3  . losartan (COZAAR) 100 MG tablet TAKE ONE TABLET BY MOUTH ONE TIME DAILY 90 tablet 2  . Multiple Vitamins-Minerals (MULTIVITAMIN WITH MINERALS) tablet Take 1 tablet by mouth daily.    [name] omeprazole (PRILOSEC) 40 MG capsule Take 40 mg by mouth daily.    Glory Rosebush VERIO test strip USE TO CHECK BLOOD SUGAR AS NEEDED 100 strip 2  . predniSONE (DELTASONE) 50 MG tablet 1 tablet 13 hours before the procedure, 2nd dose 5 hours  Before procedure  and 3rd dose 1 hour before procedure. 3 tablet 0  . prochlorperazine (COMPAZINE) 5 MG tablet Take 5 mg by mouth every 6  (six) hours as needed for nausea or vomiting.    . Prucalopride Succinate (MOTEGRITY) 2 MG TABS Take 2 mg by mouth daily as needed (constipation).    [name] rOPINIRole (REQUIP) 0.25 MG tablet Take 0.25 mg by mouth in the morning and at bedtime.     . rosuvastatin (CRESTOR) 20 MG tablet Take 20 mg by mouth daily.    . Syringe/Needle, Disp, (SYRINGE 3CC/25GX1") 25G X 1" 3 ML MISC Use for b12 injections 50 each 0  . TRELEGY ELLIPTA 100-62.5-25 MCG/ACT AEPB Inhale  1 puff into the lungs daily.    [name] triamcinolone (NASACORT) 55 MCG/ACT AERO nasal inhaler Place 2 sprays into the nose at bedtime.    [name] zolpidem (AMBIEN CR) 12.5 MG CR tablet TAKE ONE TABLET BY MOUTH AT BEDTIME AS NEEDED FOR SLEEP 30 tablet 5  . diphenhydrAMINE (BENADRYL) 50 MG tablet Take 1 tablet (50 mg total) by mouth once for 1 dose. Take 50 mg by mouth 1 hour prior to scan time 1 tablet 0  . nitroGLYCERIN (NITROSTAT) 0.4 MG SL tablet Place 1 tablet (0.4 mg total) under the tongue every 5 (five) minutes as needed for chest pain. Maximum of 3 doses. 35 tablet 3   No facility-administered medications prior to visit.    Allergies  Allergen Reactions  . Avelox [Moxifloxacin] Hives and Swelling    Severe facial swelling  . Gadolinium Derivatives Swelling    (OK with pre-medication) (OK with pre-medication)  . Contrast Media [Iodinated Diagnostic Agents] Swelling    (OK with pre-medication)  . Crestor [Rosuvastatin Calcium]     myalgia  . Morphine And Related Itching  . Niacin     Other reaction(s): Unknown  . Niacin-Simvastatin Er     myalgia  . Latex Rash    Gloves cause raxh  . Tape Dermatitis  . Tapentadol Dermatitis  . Wound Dressing Adhesive Dermatitis    Review of Systems  Musculoskeletal:  Positive for neck stiffness.  Skin: Negative.   Neurological:  Positive for dizziness and headaches.  Psychiatric/Behavioral: Negative.    All other systems reviewed and are negative.     Objective:    Physical Exam Vitals and  nursing note reviewed.  Constitutional:      Appearance: Normal appearance.  HENT:     Right Ear: Tympanic membrane and ear canal normal.     Left Ear: Tympanic membrane and ear canal normal.  Eyes:     Extraocular Movements: Extraocular movements intact.     Pupils: Pupils are equal, round, and reactive to light.  Neck:     Comments: Crepitus with rotation of the neck  Cardiovascular:     Rate and Rhythm: Normal rate and regular rhythm.  Pulmonary:     Effort: Pulmonary effort is normal.     Breath sounds: Normal breath sounds.  Abdominal:     General: Abdomen is flat.     Palpations: Abdomen is soft.  Musculoskeletal:        General: Normal range of motion.     Cervical back: Normal range of motion and neck supple. No tenderness.  Skin:    General: Skin is warm and dry.  Neurological:     General: No focal deficit present.     Mental Status: He is alert and oriented to person, place, and time.  Psychiatric:        Mood and Affect: Mood normal.        Behavior: Behavior normal.   BP 125/70   Pulse 61   Temp 97.7 F (36.5 C) (Oral)   Ht _0  (1.88 m)   Wt 218 lb 6.4 oz (99.1 kg)   SpO2 95%   BMI 28.04 kg/m  Wt Readings from Last 3 Encounters:  11/17/21 218 lb 6.4 oz (99.1 kg)  10/29/21 217 lb 3.2 oz (98.5 kg)  10/26/21 216 lb (98 kg)    Health Maintenance Due  Topic Date Due  . HIV Screening  Never done  . Hepatitis C Screening  Never done  . Zoster Vaccines-  Shingrix (1 of 2) Never done  . TETANUS/TDAP  08/25/2018  . COVID-19 Vaccine (3 - Pfizer risk series) 05/31/2020    There are no preventive care reminders to display for this patient.   Lab Results  Component Value Date   TSH 2.18 09/23/2020   Lab Results  Component Value Date   WBC 7.6 08/19/2021   HGB 15.7 08/19/2021   HCT 46.1 08/19/2021   MCV 90.2 08/19/2021   PLT 383 08/19/2021   Lab Results  Component Value Date   NA 139 07/22/2021   K 4.6 07/22/2021   CO2 28 07/22/2021   GLUCOSE  107 (H) 07/22/2021   BUN 17 07/22/2021   CREATININE 1.00 09/30/2021   BILITOT 0.4 07/22/2021   ALKPHOS 76 07/22/2021   AST 19 07/22/2021   ALT 26 07/22/2021   PROT 7.5 07/22/2021   ALBUMIN 4.3 07/22/2021   CALCIUM 9.8 07/22/2021   ANIONGAP 12 09/12/2020   GFR 73.14 07/22/2021   Lab Results  Component Value Date   CHOL 147 07/22/2021   Lab Results  Component Value Date   HDL 47.40 07/22/2021   Lab Results  Component Value Date   LDLCALC 77 07/22/2021   Lab Results  Component Value Date   TRIG 112.0 07/22/2021   Lab Results  Component Value Date   CHOLHDL 3 07/22/2021   Lab Results  Component Value Date   HGBA1C 6.0 09/23/2020       Assessment & Plan:   Problem List Items Addressed This Visit   None Visit Diagnoses     Occipital headache    -  Primary   Relevant Orders   DG Cervical Spine Complete   Acute muscle stiffness of neck       Relevant Orders   DG Cervical Spine Complete   Dizziness       Occipital neuralgia, unspecified laterality          Plan: Xray of the c-spine will notify patient pending results and refer to ortho and/or neurology pending results.   No orders of the defined types were placed in this encounter.    Kennyth Arnold, FNP

## 2021-11-20 ENCOUNTER — Other Ambulatory Visit: Payer: Self-pay | Admitting: Family

## 2021-11-20 DIAGNOSIS — M5030 Other cervical disc degeneration, unspecified cervical region: Secondary | ICD-10-CM

## 2021-12-07 ENCOUNTER — Ambulatory Visit: Payer: Medicare Other | Admitting: Surgery

## 2021-12-15 ENCOUNTER — Other Ambulatory Visit: Payer: Self-pay

## 2021-12-15 ENCOUNTER — Encounter: Payer: Self-pay | Admitting: Orthopaedic Surgery

## 2021-12-15 ENCOUNTER — Ambulatory Visit (INDEPENDENT_AMBULATORY_CARE_PROVIDER_SITE_OTHER): Payer: Medicare Other | Admitting: Orthopaedic Surgery

## 2021-12-15 VITALS — BP 137/86 | HR 80 | Ht 74.0 in | Wt 218.0 lb

## 2021-12-15 DIAGNOSIS — I2510 Atherosclerotic heart disease of native coronary artery without angina pectoris: Secondary | ICD-10-CM

## 2021-12-15 DIAGNOSIS — M4722 Other spondylosis with radiculopathy, cervical region: Secondary | ICD-10-CM

## 2021-12-15 DIAGNOSIS — M47812 Spondylosis without myelopathy or radiculopathy, cervical region: Secondary | ICD-10-CM

## 2021-12-15 NOTE — Progress Notes (Signed)
Office Visit Note   Patient: Brian Atkins           Date of Birth: 11/20/1956           MRN: [id number] Visit Date: 12/15/2021              Requested by: Kennyth Arnold, [place],  [place] [address] PCP: [name], MD   Assessment & Plan: Visit Diagnoses:  1. Cervical spondylosis   2. Other spondylosis with radiculopathy, cervical region     Plan: We will set patient up for an MRI scan cervical spine.  Past history of lymphoma.  Office follow-up after for cervical spine images.  He has failed Tylenol therapy exercises Advil and does have previous history of malignancy.  Follow-Up Instructions: No follow-ups on file.   Orders:  Orders Placed This Encounter  Procedures   MR Cervical Spine w/o contrast   No orders of the defined types were placed in this encounter.     Procedures: No procedures performed   Clinical Data: No additional findings.   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  He had radiation treatments at the head also chemotherapy and states radiation caused significant lymphedema and he a was hospitalized for many days.  He has had numbness in both legs worse on the left than right but primarily occipital headaches pain that radiates more into his shoulders and more neck pain than low back pain.  Previous MRI lumbar spine showed some disc degeneration L5-S1 visualized on PACS done at [facility].  He has not had cervical MRI scan.  Patient states he has pain with rotation of his neck numbness that shoots into his arms at times when he has his elbow bent he wakes up with his entire left arm and hand.  Review of Systems all the systems noncontributory HPI.   Objective: Vital Signs: BP 137/86    Pulse 80    Ht _0  (1.88 m)    Wt 218 lb (98.9 kg)    BMI 27.99 kg/m   Physical Exam Constitutional:      Appearance:  He is well-developed.  HENT:     Head: Normocephalic and atraumatic.     Right Ear: External ear normal.     Left Ear: External ear normal.  Eyes:     Pupils: Pupils are equal, round, and reactive to light.  Neck:     Thyroid: No thyromegaly.     Trachea: No tracheal deviation.  Cardiovascular:     Rate and Rhythm: Normal rate.  Pulmonary:     Effort: Pulmonary effort is normal.     Breath sounds: No wheezing.  Abdominal:     General: Bowel sounds are normal.     Palpations: Abdomen is soft.  Musculoskeletal:     Cervical back: Neck supple.  Skin:    General: Skin is warm and dry.     Capillary Refill: Capillary refill takes less than 2 seconds.  Neurological:     Mental Status: He is alert and oriented to person, place, and time.  Psychiatric:        Behavior: Behavior normal.        Thought Content: Thought content normal.        Judgment: Judgment normal.    Ortho Exam patient has intact upper extremity reflexes brachial plexus  tenderness worse on left than right some sciatic notch tenderness negative straight leg raising 90 degrees right and left negative logroll hips.  Specialty Comments:  No specialty comments available.  Imaging: Narrative & Impression  CLINICAL DATA:  Neck pain   EXAM: CERVICAL SPINE - COMPLETE 4+ VIEW   COMPARISON:  03/14/2018   FINDINGS: Nondiagnostic oblique views secondary to positioning. Cervical alignment within normal limits. Dens and lateral masses are within normal limits. Multilevel degenerative change with moderate disc space narrowing C5-C6 and C6-C7, slightly progressed as compared with 2019.   IMPRESSION: Slight progression of degenerative changes at C5-C6 and C6-C7     Electronically Signed   By: Donavan Foil M.D.   On: 11/19/2021 16:27       PMFS History: Patient Active Problem List   Diagnosis Date Noted   Other spondylosis with radiculopathy, cervical region 12/15/2021   Nonallergic hypersensitivity to  gadolinium and gadolinium compound 10/27/2021   Raynaud's disease without gangrene 02/24/2021   Aortic atherosclerosis ([place]) 02/08/2021   Bronchiectasis ([place]) 02/08/2021   Pleural scarring 02/08/2021   COVID toes 01/30/2021   Coronary artery disease involving native coronary artery of native heart without angina pectoris 12/19/2020   Lower extremity edema 12/19/2020   Chronic right shoulder pain 10/24/2020   Cough 09/23/2020   Pain in both lower extremities 03/27/2020   History of COVID-19 02/08/2020   Prediabetes 03/14/2019   New persistent daily headache 03/14/2019   Bilateral inguinal hernia without obstruction or gangrene 09/07/2018   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 06/07/2018   B12 deficiency 03/15/2018   Numbness and tingling of upper and lower extremities of both sides 03/15/2018   Shortness of breath 12/01/2017   Palpitations 12/01/2017   Diplopia 11/24/2017   Chest pain of uncertain etiology 10/09/1172   Paroxysmal supraventricular tachycardia ([place]) 11/22/2017   Small intestinal bacterial overgrowth 07/26/2017   Hx of splenectomy 05/17/2017   Hx of angioedema 05/17/2017   Fatigue 02/20/2017   Slow transit constipation 02/20/2017   Insomnia due to medical condition 09/30/2015   Post-nasal drip 09/30/2015   Hx of cerebral venous sinus thrombosis 05/18/2015   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   Hx antineoplastic chemotherapy 01/19/2015   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   Herpes zoster 11/07/2014   Angioedema 06/27/2014   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 LHC 12/18: ostLAD 20%, p/mLAD 60% FFR 0.84, ost ramus 50%, mRCA 50% FFR 0.94, EF 65%   COVID-19 virus infection 12/28/2019   Diverticulitis    Diverticulosis    Dyslipidemia    Dyspnea    Family history of adverse reaction to anesthesia    Mother - slow to wake   GERD (gastroesophageal reflux disease)    Hemorrhoids    Hiatal hernia    History of 2019 novel coronavirus disease (COVID-19) 12/24/2019   History of echocardiogram  a. TTE 1/19: EF of 60-65%, normal wall motion, normal LV diastolic function, mildly dilated LA   History of kidney stones    Labile hypertension    Lymphoma ([facility])    Medial epicondylitis of right elbow    Meralgia paresthetica of right side    Microscopic hematuria    Ocular migraine    Pneumonia    Sleep apnea    CPAP    Family History  Problem Relation Age of Onset   Coronary artery disease Father 8       CABG   Aortic aneurysm Father 46       repaired   Hyperlipidemia Father    Lung cancer Mother    Hypertension Mother    Cancer Mother        bladder   COPD Mother    Stomach cancer Maternal Grandfather     Past Surgical History:  Procedure Laterality Date   BRONCHIAL WASHINGS N/A 08/21/2021   Procedure: BRONCHIAL WASHINGS;  Surgeon: Ottie Glazier, MD;  Location: [facility];  Service: Thoracic;  Laterality: N/A;   COLONOSCOPY     FLEXIBLE BRONCHOSCOPY N/A 08/21/2021   Procedure: FLEXIBLE BRONCHOSCOPY;  Surgeon: Ottie Glazier, MD;  Location: [facility];  Service: Thoracic;  Laterality: N/A;   INTRAVASCULAR PRESSURE WIRE/FFR STUDY N/A 12/07/2017   Procedure: INTRAVASCULAR PRESSURE WIRE/FFR STUDY;  Surgeon: Nelva Bush, MD;  Location: [facility];  Service: Cardiovascular;  Laterality: N/A;   LEFT  HEART CATH AND CORONARY ANGIOGRAPHY Left 12/07/2017   Procedure: LEFT HEART CATH AND CORONARY ANGIOGRAPHY;  Surgeon: Nelva Bush, MD;  Location: [facility];  Service: Cardiovascular;  Laterality: Left;   nissen funduplication  [id number]   [name]   SEPTOPLASTY Bilateral 08/23/2019   Procedure: SEPTOPLASTY;  Surgeon: [name], MD;  Location: [facility];  Service: ENT;  Laterality: Bilateral;   SPLENECTOMY     TURBINATE REDUCTION Bilateral 08/23/2019   Procedure: TURBINATE REDUCTION;  Surgeon: [name], MD;  Location: [facility];  Service: ENT;  Laterality: Bilateral;  Latex sensitivity sleep apnea   Social History   Occupational History   Occupation: Shop Printmaker: [employer]    Comment: [employer]  Tobacco Use   Smoking status: Never    Passive exposure: Past   Smokeless tobacco: Never  Vaping Use   Vaping Use: Never used  Substance and Sexual Activity   Alcohol use: No   Drug use: No   Sexual activity: Yes

## 2021-12-28 ENCOUNTER — Encounter: Payer: Self-pay | Admitting: Surgery

## 2021-12-28 ENCOUNTER — Ambulatory Visit (INDEPENDENT_AMBULATORY_CARE_PROVIDER_SITE_OTHER): Payer: Medicare Other | Admitting: Surgery

## 2021-12-28 ENCOUNTER — Other Ambulatory Visit: Payer: Self-pay

## 2021-12-28 VITALS — BP 152/92 | HR 64 | Temp 98.4°F | Ht 74.0 in | Wt 221.4 lb

## 2021-12-28 DIAGNOSIS — K449 Diaphragmatic hernia without obstruction or gangrene: Secondary | ICD-10-CM | POA: Diagnosis not present

## 2021-12-28 NOTE — Patient Instructions (Addendum)
We sent a referral to [facility]. They will call you to schedule you an appointment for a EGD.   If you have any concerns or questions, please feel free to call our office.   Hiatal Hernia  A hiatal hernia occurs when part of the stomach slides above the muscle that separates the abdomen from the chest (diaphragm). A person can be born with a hiatal hernia (congenital), or it may develop over time. In almost all cases of hiatal hernia, only the top part of the stomach pushes through the diaphragm. Many people have a hiatal hernia with no symptoms. The larger the hernia, the more likely it is that you will have symptoms. In some cases, a hiatal hernia allows stomach acid to flow back into the tube that carries food from your mouth to your stomach (esophagus). This may cause heartburn symptoms. Severe heartburn symptoms may mean that you have developed a condition called gastroesophageal reflux disease (GERD). What are the causes? This condition is caused by a weakness in the opening (hiatus) where the esophagus passes through the diaphragm to attach to the upper part of the stomach. A person may be born with a weakness in the hiatus, or a weakness can develop over time. What increases the risk? This condition is more likely to develop in: Older people. Age is a major risk factor for a hiatal hernia, especially if you are over the age of 52. Pregnant women. People who are overweight. People who have frequent constipation. What are the signs or symptoms? Symptoms of this condition usually develop in the form of GERD symptoms. Symptoms include: Heartburn. Belching. Indigestion. Trouble swallowing. Coughing or wheezing. Sore throat. Hoarseness. Chest pain. Nausea and vomiting. How is this diagnosed? This condition may be diagnosed during testing for GERD. Tests that may be done include: X-rays of your stomach or chest. An upper gastrointestinal (GI) series. This is an X-ray exam of your GI  tract that is taken after you swallow a chalky liquid that shows up clearly on the X-ray. Endoscopy. This is a procedure to look into your stomach using a thin, flexible tube that has a tiny camera and light on the end of it. How is this treated? This condition may be treated by: Dietary and lifestyle changes to help reduce GERD symptoms. Medicines. These may include: Over-the-counter antacids. Medicines that make your stomach empty more quickly. Medicines that block the production of stomach acid (H2 blockers). Stronger medicines to reduce stomach acid (proton pump inhibitors). Surgery to repair the hernia, if other treatments are not helping. If you have no symptoms, you may not need treatment. Follow these instructions at home: Lifestyle and activity Do not use any products that contain nicotine or tobacco, such as cigarettes and e-cigarettes. If you need help quitting, ask your health care provider. Try to achieve and maintain a healthy body weight. Avoid putting pressure on your abdomen. Anything that puts pressure on your abdomen increases the amount of acid that may be pushed up into your esophagus. Avoid bending over, especially after eating. Raise the head of your bed by putting blocks under the legs. This keeps your head and esophagus higher than your stomach. Do not wear tight clothing around your chest or stomach. Try not to strain when having a bowel movement, when urinating, or when lifting heavy objects. Eating and drinking Avoid foods that can worsen GERD symptoms. These may include: Fatty foods, like fried foods. Citrus fruits, like oranges or lemon. Other foods and drinks that  contain acid, like orange juice or tomatoes. Spicy food. Chocolate. Eat frequent small meals instead of three large meals a day. This helps prevent your stomach from getting too full. Eat slowly. Do not lie down right after eating. Do not eat 1-2 hours before bed. Do not drink beverages with  caffeine. These include cola, coffee, cocoa, and tea. Do not drink alcohol. General instructions Take over-the-counter and prescription medicines only as told by your health care provider. Keep all follow-up visits as told by your health care provider. This is important. Contact a health care provider if: Your symptoms are not controlled with medicines or lifestyle changes. You are having trouble swallowing. You have coughing or wheezing that will not go away. Get help right away if: Your pain is getting worse. Your pain spreads to your arms, neck, jaw, teeth, or back. You have shortness of breath. You sweat for no reason. You feel sick to your stomach (nauseous) or you vomit. You vomit blood. You have bright red blood in your stools. You have black, tarry stools. Summary A hiatal hernia occurs when part of the stomach slides above the muscle that separates the abdomen from the chest (diaphragm). A person may be born with a weakness in the hiatus, or a weakness can develop over time. Symptoms of hiatal hernia may include heartburn, trouble swallowing, or sore throat. Management of hiatal hernia includes eating frequent small meals instead of three large meals a day. Get help right away if you vomit blood, have bright red blood in your stools, or have black, tarry stools. This information is not intended to replace advice given to you by your health care provider. Make sure you discuss any questions you have with your health care provider. Document Revised: 11/06/2020 Document Reviewed: 11/06/2020 Elsevier Patient Education  2022 [employer].

## 2021-12-29 ENCOUNTER — Telehealth: Payer: Self-pay

## 2021-12-29 NOTE — Telephone Encounter (Signed)
Left message on voicemail requesting pt to call to scheduled EGD for GERD per Dr [name]

## 2021-12-29 NOTE — Telephone Encounter (Signed)
Brian Husbands, MD  You have seen this guy, prior nissen 20 years ago. He did have recurrent hiatal hernia. Seen at [facility], manometry done. He definitely has a hiatal hernia recurrence. He wishes to have a repeat EGD and complete w/u he doesnt want to go back to [facility]. Would you be willing to do it?  Lucilla Lame, MD  sure.

## 2021-12-30 ENCOUNTER — Encounter: Payer: Self-pay | Admitting: Surgery

## 2021-12-30 ENCOUNTER — Other Ambulatory Visit: Payer: Medicare Other

## 2021-12-30 NOTE — Progress Notes (Signed)
Outpatient Surgical Follow Up  12/30/2021  Brian Atkins is an 66 y.o. male.   Chief Complaint  Patient presents with   Follow-up    Hiatal hernia    HPI: Brian Atkins is a 66 y.o. male seen in F/U for recalcitrant GERD.  He had a prior Nissen fundoplication more than 20 years ago by Dr. Hassell Done in [place].  He stated that his symptoms dramatically proved after the surgery but is slowly came back.  He does have significant reflux symptoms consistent with heartburn.  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  He usually is seen at [facility] and already had a manometry as well as pH probe.   PH probe shows:                              - Control of the distal esophageal acid was excellent.  - Control of the gastric acid was excellent.  - Normal absolute number of gastroesophageal reflux  events on PPI once daily and H2RA twice daily.  - Negative symptom correlation for all symptoms.  The manometry was also done at that time and was reported as: - Impaired relaxation of the upper esophageal sphincter.  - The esophageal manometry was otherwise normal.    He did have a recent CT scan that I ordered and I have personally reviewed, there is a clear recurrence of the paraesophageal hernia, wrap seems to be intact there recurrence was not there on 2015. On swallow study there is also a hernia and there is reflux as well.  He Does have significant history of coronary artery disease.  He does have a preserved ejection fraction and is able to perform more than 4 METS of activity without any shortness of air or chest pain.  He is a Engineer, drilling. 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 He does take PPI w only partial relieve of sxs. He seems to have some dysphagia but seems to be only cervical and upper esophageal dysphagia seems to be no correlation with what type of meals. He did have an EGD in 2019  showing evidence of gastric inlet patches.   Past Medical History:  Diagnosis Date   Complication of anesthesia    slow to wake   Coronary artery disease, non-occlusive    a. LHC 12/18: ostLAD 20%, p/mLAD 60% FFR 0.84, ost ramus 50%, mRCA 50% FFR 0.94, EF 65%   COVID-19 virus infection 12/28/2019   Diverticulitis    Diverticulosis    Dyslipidemia    Dyspnea    Family history of adverse reaction to anesthesia    Mother - slow to wake   GERD (gastroesophageal reflux disease)    Hemorrhoids    Hiatal hernia    History of 2019 novel coronavirus disease (COVID-19) 12/24/2019   History of echocardiogram    a. TTE 1/19: EF of 60-65%, normal wall motion, normal LV diastolic function, mildly dilated LA   History of kidney stones    Labile hypertension    Lymphoma (HCC)    Medial epicondylitis of right elbow    Meralgia paresthetica of right side    Microscopic hematuria    Ocular migraine    Pneumonia    Sleep apnea    CPAP    Past Surgical History:  Procedure Laterality Date   BRONCHIAL WASHINGS N/A 08/21/2021  Procedure: BRONCHIAL WASHINGS;  Surgeon: Ottie Glazier, MD;  Location: [facility] OR[facility];  Service: Thoracic;  Laterality: N/A;   COLONOSCOPY     FLEXIBLE BRONCHOSCOPY N/A 08/21/2021   Procedure: FLEXIBLE BRONCHOSCOPY;  Surgeon: Ottie Glazier, MD;  Location: [facility];  Service: Thoracic;  Laterality: N/A;   INTRAVASCULAR PRESSURE WIRE/FFR STUDY N/A 12/07/2017   Procedure: INTRAVASCULAR PRESSURE WIRE/FFR STUDY;  Surgeon: Nelva Bush, MD;  Location: [place] [facility];  Service: Cardiovascular;  Laterality: N/A;   LEFT HEART CATH AND CORONARY ANGIOGRAPHY Left 12/07/2017   Procedure: LEFT HEART CATH AND CORONARY ANGIOGRAPHY;  Surgeon: Nelva Bush, MD;  Location: [place] [facility];  Service: Cardiovascular;  Laterality: Left;   nissen funduplication  0712   [name]   SEPTOPLASTY Bilateral 08/23/2019   Procedure: SEPTOPLASTY;  Surgeon: [name], MD;   Location: [place];  Service: ENT;  Laterality: Bilateral;   SPLENECTOMY     TURBINATE REDUCTION Bilateral 08/23/2019   Procedure: TURBINATE REDUCTION;  Surgeon: [name], MD;  Location: [place];  Service: ENT;  Laterality: Bilateral;  Latex sensitivity sleep apnea    Family History  Problem Relation Age of Onset   Coronary artery disease Father 46       CABG   Aortic aneurysm Father 92       repaired   Hyperlipidemia Father    Lung cancer Mother    Hypertension Mother    Cancer Mother        bladder   COPD Mother    Stomach cancer Maternal Grandfather     Social History:  reports that he has never smoked. He has been exposed to tobacco smoke. He has never used smokeless tobacco. He reports that he does not drink alcohol and does not use drugs.  Allergies:  Allergies  Allergen Reactions   Avelox [Moxifloxacin] Hives and Swelling    Severe facial swelling   Gadolinium Derivatives Swelling    (OK with pre-medication) (OK with pre-medication)   Contrast Media [Iodinated Contrast Media] Swelling    (OK with pre-medication)   Crestor [Rosuvastatin Calcium]     myalgia   Morphine And Related Itching   Niacin     Other reaction(s): Unknown   Niacin-Simvastatin Er     myalgia   Latex Rash    Gloves cause raxh   Tape Dermatitis   Tapentadol Dermatitis   Wound Dressing Adhesive Dermatitis    Medications reviewed.    ROS Full ROS performed and is otherwise negative other than what is stated in HPI   BP (!) 152/92    Pulse 64    Temp 98.4 F (36.9 C) (Oral)    Ht _0  (1.88 m)    Wt 221 lb 6.4 oz (100.4 kg)    SpO2 94%    BMI 28.43 kg/m   Physical Exam Vitals and nursing note reviewed. Exam conducted with a chaperone present.  Constitutional:      General: He is not in acute distress.    Appearance: Normal appearance. He is normal weight.  Cardiovascular:     Rate and Rhythm: Normal rate and regular rhythm.  Pulmonary:     Effort:  Pulmonary effort is normal. No respiratory distress.     Breath sounds: Normal breath sounds. No stridor.  Abdominal:     General: Abdomen is flat. There is no distension.     Palpations: Abdomen is soft. There is no mass.     Tenderness: There is no abdominal tenderness. There  is no guarding or rebound.     Hernia: No hernia is present.  Musculoskeletal:        General: No swelling or tenderness. Normal range of motion.     Cervical back: Normal range of motion and neck supple. No rigidity or tenderness.  Lymphadenopathy:     Cervical: No cervical adenopathy.  Skin:    General: Skin is warm and dry.     Capillary Refill: Capillary refill takes less than 2 seconds.  Neurological:     General: No focal deficit present.     Mental Status: He is alert and oriented to person, place, and time.  Psychiatric:        Mood and Affect: Mood normal.        Behavior: Behavior normal.        Thought Content: Thought content normal.        Judgment: Judgment normal.   Assessment/Plan: 66 year old male male with a prior history of hiatal hernia repair and Nissen fundoplication as well as a prior splenectomy.  He continues to have recalcitrant symptoms.  Manometry on CT scan and barium swallow studies have conflicting information as well as necessarily consistent clinical correlation. This is definitely a complex situation.  I have had explained this to him in detail.  I do think that an EGD continues to be require in order to continue with appropriate work-up. Did disclose to him that doing a redo Nissen fundoplication presents technical challenges but it can be done.  I do think that restoring GE junction to the intra-abdominal position placed significant role in the pathophysiology of reflux disease. He understands that he does have some cervical dysphagia that I cannot really explain. I would like to get EGD and see him again in consideration for hiatal hernia repair. HE also understands that his GI  sxs and cervical dysphagia may not be resolved w procedure. please note that  I spent greater than 45 minutes in this encounter including coordination of his care, reviewing images personally, counseling the patient and performing appropriate documentation  Caroleen Hamman, MD [facility]

## 2022-01-01 ENCOUNTER — Ambulatory Visit
Admission: RE | Admit: 2022-01-01 | Discharge: 2022-01-01 | Disposition: A | Discharge status: Home / Self Care | Payer: Medicare Other | Source: Ambulatory Visit | Attending: Orthopaedic Surgery | Admitting: Orthopaedic Surgery

## 2022-01-01 DIAGNOSIS — M47812 Spondylosis without myelopathy or radiculopathy, cervical region: Secondary | ICD-10-CM

## 2022-01-05 ENCOUNTER — Ambulatory Visit (INDEPENDENT_AMBULATORY_CARE_PROVIDER_SITE_OTHER): Payer: Medicare Other | Admitting: Orthopaedic Surgery

## 2022-01-05 ENCOUNTER — Encounter: Payer: Self-pay | Admitting: Orthopaedic Surgery

## 2022-01-05 ENCOUNTER — Other Ambulatory Visit: Payer: Self-pay

## 2022-01-05 VITALS — BP 149/84 | HR 79 | Ht 74.0 in | Wt 221.0 lb

## 2022-01-05 DIAGNOSIS — M545 Low back pain, unspecified: Secondary | ICD-10-CM | POA: Diagnosis not present

## 2022-01-05 DIAGNOSIS — M4722 Other spondylosis with radiculopathy, cervical region: Secondary | ICD-10-CM | POA: Diagnosis not present

## 2022-01-05 DIAGNOSIS — M47812 Spondylosis without myelopathy or radiculopathy, cervical region: Secondary | ICD-10-CM | POA: Diagnosis not present

## 2022-01-05 DIAGNOSIS — G8929 Other chronic pain: Secondary | ICD-10-CM

## 2022-01-05 NOTE — Progress Notes (Signed)
Office Visit Note   Patient: Brian Atkins           Date of Birth: 1956-01-24           MRN: [id number] Visit Date: 01/05/2022              Requested by: [name], MD [address] [place],  [place] [address] PCP: [name], MD   Assessment & Plan: Visit Diagnoses:  1. Cervical spondylosis   2. Chronic low back pain, unspecified back pain laterality, unspecified whether sciatica present   3. Other spondylosis with radiculopathy, cervical region     Plan: We will proceed with some therapy in [facility] outpatient at [facility].  Evaluation and treatment of neck and low back pain with some cervical foraminal stenosis.  I will recheck him in 2 months.  Follow-Up Instructions: Return in about 2 months (around 03/05/2022).   Orders:  Orders Placed This Encounter  Procedures   Ambulatory referral to Physical Therapy   No orders of the defined types were placed in this encounter.     Procedures: No procedures performed   Clinical Data: No additional findings.   Subjective: Chief Complaint  Patient presents with   Neck - Pain, Follow-up    MRI cervical spine review    HPI 66 year old male returns with ongoing problems with neck pain and low back pain.  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  He has had problems with lymphedema.  Numbness in his legs persistent discomfort pain and stiffness in his neck cervical MRI scan 01/02/2022 shows progressive spondylosis with moderate foraminal narrowing bilaterally at L3-4 moderate left and mild right foraminal narrowing at C4-5 and moderate left and mild foraminal narrowing at C5-6.  There is only mild foraminal narrowing at C6-7.  Review of Systems all systems updated and unchanged from previous office visit.   Objective: Vital Signs: BP (!) 149/84    Pulse 79    Ht _0  (1.88 m)    Wt 221 lb (100.2 kg)    BMI 28.37 kg/m   Physical  Exam Constitutional:      Appearance: He is well-developed.  HENT:     Head: Normocephalic and atraumatic.     Right Ear: External ear normal.     Left Ear: External ear normal.  Eyes:     Pupils: Pupils are equal, round, and reactive to light.  Neck:     Thyroid: No thyromegaly.     Trachea: No tracheal deviation.  Cardiovascular:     Rate and Rhythm: Normal rate.  Pulmonary:     Effort: Pulmonary effort is normal.     Breath sounds: No wheezing.  Abdominal:     General: Bowel sounds are normal.     Palpations: Abdomen is soft.  Musculoskeletal:     Cervical back: Neck supple.  Skin:    General: Skin is warm and dry.     Capillary Refill: Capillary refill takes less than 2 seconds.  Neurological:     Mental Status: He is alert and oriented to person, place, and time.  Psychiatric:        Behavior: Behavior normal.        Thought Content: Thought content normal.        Judgment: Judgment normal.    Ortho Exam patient has symmetric upper extremity reflexes more brachial plexus tenderness on the left with positive Spurling negative on the right.  Negative straight legs raising to 90 degrees negative logroll the hips.  Knee and ankle jerk are intact.  Specialty Comments:  No specialty comments available.  Imaging: Narrative & Impression  CLINICAL DATA:  Cervical spondylosis. Progressive spondylosis at C5-6 and C6-7. Personal history of lymphoma.   EXAM: MRI CERVICAL SPINE WITHOUT CONTRAST   TECHNIQUE: Multiplanar, multisequence MR imaging of the cervical spine was performed. No intravenous contrast was administered.   COMPARISON:  MRI cervical spine 03/28/2018.   FINDINGS: Alignment: Slight retrolisthesis is present at C3-4. No other significant listhesis is present. Mild straightening of the normal cervical lordosis is stable.   Vertebrae: Chronic fatty endplate marrow changes at C6-7 are new. Marrow signal and vertebral body heights are otherwise normal.    Cord: Normal signal and morphology.   Posterior Fossa, vertebral arteries, paraspinal tissues: Craniocervical junction is normal. Flow is present in the vertebral arteries bilaterally. Visualized intracranial contents are normal.   Disc levels:   C2-3: Negative.   C3-4: A broad-based disc osteophyte complex is present. Uncovertebral spurring is worse right than left. Mild facet hypertrophy is worse on the left. Progressive moderate foraminal narrowing is slightly worse on the right. Partial effacement of ventral CSF present on the right.   C4-5: Uncovertebral spurring is present bilaterally. Asymmetric facet hypertrophy is present on the left. Progressive moderate left and mild right foraminal narrowing is present. Partial effacement of ventral CSF is noted.   C5-6: A leftward disc osteophyte complex present. Uncovertebral spurring is worse on the left. Moderate left foraminal narrowing has progressed. Mild right foraminal narrowing is present.   C6-7: A leftward disc osteophyte complex is present. Mild foraminal narrowing bilaterally is stable.   C7-T1: Facet hypertrophy is present bilaterally. No significant disc protrusion or stenosis is present.   IMPRESSION: 1. Progressive multilevel spondylosis of the cervical spine as described. 2. Progressive moderate foraminal narrowing bilaterally at C3-4 is slightly worse on the right. 3. Progressive moderate left and mild right foraminal narrowing at C4-5. 4. Progressive moderate left and mild right foraminal narrowing at C5-6. 5. Mild foraminal narrowing bilaterally at C6-7 is stable.     Electronically Signed   By: San Morelle M.D.   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 gadolinium compound 10/27/2021   Raynaud's disease without gangrene  02/24/2021   Aortic atherosclerosis ([place]) 02/08/2021   Bronchiectasis ([place]) 02/08/2021   Pleural scarring 02/08/2021   COVID toes 01/30/2021   Coronary artery disease involving native coronary artery of native heart without angina pectoris 12/19/2020   Lower extremity edema 12/19/2020   Chronic right shoulder pain 10/24/2020   Cough 09/23/2020   Pain in both lower extremities 03/27/2020   History of COVID-19 02/08/2020   Prediabetes 03/14/2019   New persistent daily headache 03/14/2019   Bilateral inguinal hernia without obstruction or gangrene 09/07/2018   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 06/07/2018   B12 deficiency 03/15/2018   Numbness and tingling of upper and lower extremities of both sides 03/15/2018   Shortness of breath 12/01/2017   Palpitations 12/01/2017   Diplopia 11/24/2017   Chest pain of uncertain etiology 94/76/5465   Paroxysmal supraventricular tachycardia ([place]) 11/22/2017   Small intestinal bacterial overgrowth 07/26/2017   Hx of splenectomy 05/17/2017   Hx of angioedema 05/17/2017   Fatigue 02/20/2017   Slow transit constipation 02/20/2017   Insomnia  due to medical condition 09/30/2015   Post-nasal drip 09/30/2015   Hx of cerebral venous sinus thrombosis 05/18/2015   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   Hx antineoplastic chemotherapy 01/19/2015   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   Herpes zoster 11/07/2014   Angioedema 06/27/2014   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 LHC 12/18: ostLAD 20%, p/mLAD 60% FFR 0.84, ost ramus 50%, mRCA 50% FFR 0.94, EF 65%   COVID-19 virus infection 12/28/2019   Diverticulitis    Diverticulosis    Dyslipidemia    Dyspnea    Family history of adverse reaction to anesthesia    Mother - slow to wake   GERD (gastroesophageal reflux disease)    Hemorrhoids    Hiatal hernia    History of 2019 novel coronavirus disease (COVID-19) 12/24/2019   History of echocardiogram    a. TTE 1/19: EF of 60-65%, normal wall motion, normal LV diastolic function, mildly dilated LA   History of kidney stones    Labile hypertension    Lymphoma (HCC)    Medial epicondylitis of right elbow    Meralgia paresthetica of right side    Microscopic hematuria    Ocular migraine    Pneumonia    Sleep apnea    CPAP    Family History  Problem Relation Age of Onset   Coronary artery disease Father 14       CABG   Aortic aneurysm Father 82       repaired   Hyperlipidemia Father    Lung cancer Mother    Hypertension Mother    Cancer Mother        bladder   COPD Mother    Stomach cancer Maternal Grandfather     Past Surgical History:  Procedure Laterality Date   BRONCHIAL WASHINGS N/A 08/21/2021   Procedure: BRONCHIAL WASHINGS;  Surgeon: Ottie Glazier, MD;  Location: [facility];  Service: Thoracic;  Laterality: N/A;   COLONOSCOPY     FLEXIBLE BRONCHOSCOPY N/A 08/21/2021   Procedure: FLEXIBLE BRONCHOSCOPY;  Surgeon: Ottie Glazier, MD;  Location: [facility];  Service: Thoracic;  Laterality: N/A;   INTRAVASCULAR PRESSURE WIRE/FFR STUDY N/A 12/07/2017   Procedure: INTRAVASCULAR PRESSURE WIRE/FFR STUDY;  Surgeon: Nelva Bush, MD;  Location: [place] [facility];  Service: Cardiovascular;  Laterality: N/A;   LEFT HEART CATH AND CORONARY ANGIOGRAPHY Left 12/07/2017   Procedure: LEFT HEART CATH AND  CORONARY ANGIOGRAPHY;  Surgeon: Nelva Bush, MD;  Location: [place] [facility];  Service: Cardiovascular;  Laterality: Left;   nissen funduplication  1610   [name]   SEPTOPLASTY Bilateral 08/23/2019   Procedure: SEPTOPLASTY;  Surgeon: [name], MD;  Location: [place];  Service: ENT;  Laterality: Bilateral;   SPLENECTOMY     TURBINATE REDUCTION Bilateral 08/23/2019   Procedure: TURBINATE REDUCTION;  Surgeon: [name], MD;  Location:  [facility];  Service: ENT;  Laterality: Bilateral;  Latex sensitivity sleep apnea   Social History   Occupational History   Occupation: Shop Printmaker: [employer]    Comment: [employer]  Tobacco Use   Smoking status: Never    Passive exposure: Past   Smokeless tobacco: Never  Vaping Use   Vaping Use: Never used  Substance and Sexual Activity   Alcohol use: No   Drug use: No   Sexual activity: Yes

## 2022-01-11 ENCOUNTER — Other Ambulatory Visit: Payer: Self-pay | Admitting: Internal Medicine

## 2022-01-13 ENCOUNTER — Other Ambulatory Visit: Payer: Self-pay

## 2022-01-13 ENCOUNTER — Ambulatory Visit: Payer: Medicare Other | Attending: Orthopaedic Surgery

## 2022-01-13 DIAGNOSIS — R293 Abnormal posture: Secondary | ICD-10-CM | POA: Insufficient documentation

## 2022-01-13 DIAGNOSIS — M542 Cervicalgia: Secondary | ICD-10-CM | POA: Diagnosis present

## 2022-01-13 DIAGNOSIS — M545 Low back pain, unspecified: Secondary | ICD-10-CM | POA: Diagnosis not present

## 2022-01-13 DIAGNOSIS — M47812 Spondylosis without myelopathy or radiculopathy, cervical region: Secondary | ICD-10-CM | POA: Insufficient documentation

## 2022-01-13 DIAGNOSIS — G8929 Other chronic pain: Secondary | ICD-10-CM | POA: Insufficient documentation

## 2022-01-13 NOTE — Therapy (Signed)
[facility] [address] [place], [address] Phone: [phone]   Fax:  [phone]  Physical Therapy Evaluation  Patient Details  Name: Brian Atkins MRN: [id number] Date of Birth: 1956/05/02 Referring Provider (PT): Marybelle Killings, MD   Encounter Date: 01/13/2022   PT End of Session - 01/13/22 2011     Visit Number 1    Number of Visits 17    Date for PT Re-Evaluation 03/10/22    PT Start Time 6962    PT Stop Time 1630    PT Time Calculation (min) 40 min    Activity Tolerance Patient tolerated treatment well    Behavior During Therapy [facility] for tasks assessed/performed             Past Medical History:  Diagnosis Date   Complication of anesthesia    slow to wake   Coronary artery disease, non-occlusive    a. LHC 12/18: ostLAD 20%, p/mLAD 60% FFR 0.84, ost ramus 50%, mRCA 50% FFR 0.94, EF 65%   COVID-19 virus infection 12/28/2019   Diverticulitis    Diverticulosis    Dyslipidemia    Dyspnea    Family history of adverse reaction to anesthesia    Mother - slow to wake   GERD (gastroesophageal reflux disease)    Hemorrhoids    Hiatal hernia    History of 2019 novel coronavirus disease (COVID-19) 12/24/2019   History of echocardiogram    a. TTE 1/19: EF of 60-65%, normal wall motion, normal LV diastolic function, mildly dilated LA   History of kidney stones    Labile hypertension    Lymphoma (HCC)    Medial epicondylitis of right elbow    Meralgia paresthetica of right side    Microscopic hematuria    Ocular migraine    Pneumonia    Sleep apnea    CPAP    Past Surgical History:  Procedure Laterality Date   BRONCHIAL WASHINGS N/A 08/21/2021   Procedure: BRONCHIAL WASHINGS;  Surgeon: Ottie Glazier, MD;  Location: [facility];  Service: Thoracic;  Laterality: N/A;   COLONOSCOPY     FLEXIBLE BRONCHOSCOPY N/A 08/21/2021   Procedure: FLEXIBLE BRONCHOSCOPY;  Surgeon: Ottie Glazier, MD;   Location: [facility];  Service: Thoracic;  Laterality: N/A;   INTRAVASCULAR PRESSURE WIRE/FFR STUDY N/A 12/07/2017   Procedure: INTRAVASCULAR PRESSURE WIRE/FFR STUDY;  Surgeon: Nelva Bush, MD;  Location: [place] [facility];  Service: Cardiovascular;  Laterality: N/A;   LEFT HEART CATH AND CORONARY ANGIOGRAPHY Left 12/07/2017   Procedure: LEFT HEART CATH AND CORONARY ANGIOGRAPHY;  Surgeon: Nelva Bush, MD;  Location: [place] [facility];  Service: Cardiovascular;  Laterality: Left;   nissen funduplication  9528   [name]   SEPTOPLASTY Bilateral 08/23/2019   Procedure: SEPTOPLASTY;  Surgeon: [name], MD;  Location: [place];  Service: ENT;  Laterality: Bilateral;   SPLENECTOMY     TURBINATE REDUCTION Bilateral 08/23/2019   Procedure: TURBINATE REDUCTION;  Surgeon: [name], MD;  Location: [place];  Service: ENT;  Laterality: Bilateral;  Latex sensitivity sleep apnea    There were no vitals filed for this visit.    Subjective Assessment - 01/13/22 1556     Subjective Pt is a 66 y.o. male referred to PT for cervical and LBP. PMH includes: GERD, HTN, HLD, lymphoma with radiation and chemo treatments, lymphedema.    Pertinent History Pt is a 66 y.o. male referred to PT for cervical  and LBP. PMH includes: GERD, HTN, HLD, lymphoma with radiation and chemo treatments, lymphedema. Pt reports cervical pain is bigger issue than LBP, reports on and off for a couple years, causing head aches and ocular migraines that follow ram's horn distribution, L > R. Pain described as dull, pounding beginning at posterior neck to frontal lobe. Pt endorses radicular symptoms occassionally into L hand in pinky finger and ring fingers. Pt's symptoms are improved with laying flat, Standing. Medication has not been helpful. Pt denies B/B changes, unrelenting night pain, significant weight loss. Symptoms limiting pt from work tasks like computer work, prolonged sititng with  work tasks. Pt's worse pain described as a 5/10 NPS, best pain is 0/10 NPS. Currently 3/10 NPS. Pt's big goal is to improve pain and not have to rely on medications.    Currently in Pain? Yes    Pain Score 3                OBJECTIVE  Mental Status Patient's fund of knowledge is within normal limits for educational level.  MUSCULOSKELETAL: Tremor: None Bulk: Normal Tone: Normal  Posture: FHP with B upper trap activation   Palpation: TTP along B cervical paraspinals C2-T7 and B suboccipitals with reproduction of ram's head distribution of pt's headaches   Strength R/L 5/5* Shoulder flexion (anterior deltoid/pec major/coracobrachialis, axillary n. (C5/6) and musculocutaneous n. (C5-7)) 5/5* Shoulder abduction (deltoid/supraspinatus, axillary/suprascapular n, C5) 5/5 Shoulder external rotation (infraspinatus/teres minor) 5/5 Shoulder internal rotation (subcapularis/lats/pec major) 5/5 Shoulder extension (posterior deltoid, lats, teres major, axillary/thoracodorsal n.) 5/5 Shoulder horizontal abduction 5/5 Elbow flexion (biceps brachii, brachialis, brachioradialis, musculoskeletal n, C5/6) 5/5 Elbow extension (triceps, radial n, C7) 5/5 Wrist Extension (C6/7) 5/5 Wrist Flexion (C6/7) 5/5 Finger adduction (interossei, ulnar n, T1)   AROM Screen R/L Shoulder AROM equal and full in all planes, no concordant pain with shoulder motion  *Indicates pain, overpressure performed unless otherwise indicated  PROM R/L PROM > AROM in all planes in supine. Has pain at end range in all planes  *Indicates pain, overpressure performed unless otherwise indicated     Passive Accessory Intervertebral Motion (PAIVM) Pt has reproduction of neck pain with CPA C2-T7 and UPA bilaterally C2-T7. Generally hypomobile throughout.  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, standing on feet for multiple hours      Cognition   Overall Cognitive Status Within Functional Limits for tasks assessed      Sensation   Light Touch Appears Intact   Diminished at L sided C5     ROM / Strength   AROM / PROM / Strength AROM      AROM   Overall AROM  Deficits    AROM Assessment Site Cervical    Cervical Flexion 15    Cervical Extension 20    Cervical - Right Side Bend 12    Cervical - Left Side Bend 15  Cervical - Right Rotation 30    Cervical - Left Rotation 40      Special Tests    Special Tests Cervical    Cervical Tests Spurling's      Spurling's   Findings Negative    Comment Negative bilat                 Objective measurements completed on examination: See above findings.                PT Education - 01/13/22 2011     Education Details HEP, POC.    Person(s) Educated Patient    Methods Explanation;Demonstration;Tactile cues;Verbal cues;Handout    Comprehension Verbalized understanding;Returned demonstration              PT Short Term Goals - 01/13/22 2018       PT SHORT TERM GOAL #1   Title Pt will be indep with HEP to demo improvements in pain and mobility to improve functional mobility    Baseline 01/13/22: Initiated    Time 4    Period Weeks    Status New    Target Date 02/10/22               PT Long Term Goals - 01/13/22 2019       PT LONG TERM GOAL #1   Title Pt will improve FOTO to target score to demo clinicially significant improvement in functional  mobility    Baseline 01/13/22: Was set up for lumbar spine; needs to perform cervical    Time 8    Period Weeks    Status New    Target Date 03/10/22      PT LONG TERM GOAL #2   Title Pt will report ability to complete full day of work with </= 2/10 pain via NPS to demonstrate clinically significant improvement in pain with job related tasks.    Baseline 01/13/22: 5/10 NPS    Time 8    Period Weeks    Status New    Target Date 03/10/22      PT LONG TERM GOAL #3   Title Pt will improve cervical AROM by 10 degrees in all planes to improve pain/mobility in neck with head mobility ADL's (driving, scanning environment).    Baseline 1/25: flex: 15, ext: 20, R/L side bend: 12/15, rotation R/L: 30/40    Time 8    Period Weeks    Status New    Target Date 03/10/22                    Plan - 01/13/22 2012     Clinical Impression Statement Pt is a 66 y.o. male referred to PT for cervical pain and LBP. Pt presents with normal strength bilat via MMT, diminished sensation to light touch at L C5 dermatome, negative Spurling's Bilaterally. Deficits discovered are significantly limited cervical AROM in all planes with concordant end range pain, PROM > AROM with end range pain, and hypomobile joint mobility with CPA's and UPA's (R/L) that are concordant along with reproduction og cervical pain and head ache pain distribution with deep palpation to suboccipitals. These deficits are preventing ability to complete ADL's requiring cervical AROM (i.e. driving tasks), and pain limiting job related tasks such as prolong standing, sitting, using computer. Pt will benefit from skilled PT services to address impairments mentioned above to return to PLOF.    Personal Factors and Comorbidities Age;Time since onset of injury/illness/exacerbation;Comorbidity 3+;Profession    Comorbidities  GERD, HTN, HLD, lymphoma with radiation and chemo treatments, lymphedema.    Examination-Activity Limitations  Hygiene/Grooming;Bend;Locomotion Level;Stand;Sit    Examination-Participation Restrictions Community Activity;Driving;Occupation;Yard Work    Merchant navy officer Evolving/Moderate complexity    Clinical Decision Making Moderate    Rehab Potential Good    PT Frequency 2x / week    PT Duration 8 weeks    PT Treatment/Interventions ADLs/Self Care Home Management;Aquatic Therapy;Cryotherapy;Electrical Stimulation;Moist Heat;Traction;Functional mobility training;Therapeutic activities;Patient/family education;Therapeutic exercise;Balance training;Neuromuscular re-education;Manual techniques;Passive range of motion;Dry needling;Spinal Manipulations;Joint Manipulations    PT Next Visit Plan Reassess HEP, thoracic mobility, screen/eval low back if appropriate?    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 10/27/2021   Raynaud's disease without gangrene 02/24/2021   Aortic atherosclerosis ([place]) 02/08/2021   Bronchiectasis ([place]) 02/08/2021   Pleural scarring 02/08/2021   COVID toes 01/30/2021   Coronary artery disease involving native coronary artery of native heart without angina pectoris 12/19/2020   Lower extremity edema 12/19/2020   Chronic right shoulder pain 10/24/2020   Cough 09/23/2020   Pain in both lower extremities 03/27/2020   History of COVID-19 02/08/2020   Prediabetes 03/14/2019   New persistent  daily headache 03/14/2019   Bilateral inguinal hernia without obstruction or gangrene 09/07/2018   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 06/07/2018   B12 deficiency 03/15/2018   Numbness and tingling of upper and lower extremities of both sides 03/15/2018   Shortness of breath 12/01/2017   Palpitations 12/01/2017   Diplopia 11/24/2017   Chest pain of uncertain etiology 79/89/2119   Paroxysmal supraventricular tachycardia ([place]) 11/22/2017   Small intestinal bacterial overgrowth 07/26/2017   Hx of splenectomy 05/17/2017   Hx of angioedema 05/17/2017   Fatigue 02/20/2017   Slow transit constipation 02/20/2017   Insomnia due to medical condition 09/30/2015   Post-nasal drip 09/30/2015   Hx of cerebral venous sinus thrombosis 05/18/2015   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   Hx antineoplastic chemotherapy 01/19/2015   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   Herpes zoster 11/07/2014   Angioedema 06/27/2014   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 Fairly IV, PT, DPT Physical Therapist- [facility]  01/13/2022, 8:25 PM  [facility] [address] [place], [address] Phone: [phone]   Fax:  [phone]  Name: Brian Atkins MRN: [id number] Date of Birth: Jul 07, 1956

## 2022-01-19 ENCOUNTER — Ambulatory Visit: Payer: Medicare Other

## 2022-01-21 ENCOUNTER — Other Ambulatory Visit: Payer: Self-pay

## 2022-01-21 ENCOUNTER — Ambulatory Visit: Payer: Medicare Other | Attending: Orthopaedic Surgery

## 2022-01-21 DIAGNOSIS — R293 Abnormal posture: Secondary | ICD-10-CM | POA: Diagnosis present

## 2022-01-21 DIAGNOSIS — M542 Cervicalgia: Secondary | ICD-10-CM | POA: Insufficient documentation

## 2022-01-21 NOTE — Therapy (Signed)
[place] [facility] [address] [place], [address] Phone: [phone]   Fax:  [phone]  Physical Therapy Treatment  Patient Details  Name: Brian Atkins MRN: [id number] Date of Birth: 1956/09/22 Referring Provider (PT): Marybelle Killings, MD   Encounter Date: 01/21/2022   PT End of Session - 01/21/22 1638     Visit Number 2    Number of Visits 17    Date for PT Re-Evaluation 03/10/22    PT Start Time 1635    PT Stop Time 6433    PT Time Calculation (min) 40 min    Activity Tolerance Patient tolerated treatment well    Behavior During Therapy [facility] for tasks assessed/performed             Past Medical History:  Diagnosis Date   Complication of anesthesia    slow to wake   Coronary artery disease, non-occlusive    a. LHC 12/18: ostLAD 20%, p/mLAD 60% FFR 0.84, ost ramus 50%, mRCA 50% FFR 0.94, EF 65%   COVID-19 virus infection 12/28/2019   Diverticulitis    Diverticulosis    Dyslipidemia    Dyspnea    Family history of adverse reaction to anesthesia    Mother - slow to wake   GERD (gastroesophageal reflux disease)    Hemorrhoids    Hiatal hernia    History of 2019 novel coronavirus disease (COVID-19) 12/24/2019   History of echocardiogram    a. TTE 1/19: EF of 60-65%, normal wall motion, normal LV diastolic function, mildly dilated LA   History of kidney stones    Labile hypertension    Lymphoma (HCC)    Medial epicondylitis of right elbow    Meralgia paresthetica of right side    Microscopic hematuria    Ocular migraine    Pneumonia    Sleep apnea    CPAP    Past Surgical History:  Procedure Laterality Date   BRONCHIAL WASHINGS N/A 08/21/2021   Procedure: BRONCHIAL WASHINGS;  Surgeon: Ottie Glazier, MD;  Location: [facility] ORS;  Service: Thoracic;  Laterality: N/A;   COLONOSCOPY     FLEXIBLE BRONCHOSCOPY N/A 08/21/2021   Procedure: FLEXIBLE BRONCHOSCOPY;  Surgeon: Ottie Glazier, MD;  Location:  [facility] ORS;  Service: Thoracic;  Laterality: N/A;   INTRAVASCULAR PRESSURE WIRE/FFR STUDY N/A 12/07/2017   Procedure: INTRAVASCULAR PRESSURE WIRE/FFR STUDY;  Surgeon: Nelva Bush, MD;  Location: [place] CV LAB;  Service: Cardiovascular;  Laterality: N/A;   LEFT HEART CATH AND CORONARY ANGIOGRAPHY Left 12/07/2017   Procedure: LEFT HEART CATH AND CORONARY ANGIOGRAPHY;  Surgeon: Nelva Bush, MD;  Location: [place] CV LAB;  Service: Cardiovascular;  Laterality: Left;   nissen funduplication  2951   [name]   SEPTOPLASTY Bilateral 08/23/2019   Procedure: SEPTOPLASTY;  Surgeon: [name], MD;  Location: [place];  Service: ENT;  Laterality: Bilateral;   SPLENECTOMY     TURBINATE REDUCTION Bilateral 08/23/2019   Procedure: TURBINATE REDUCTION;  Surgeon: [name], MD;  Location: [place];  Service: ENT;  Laterality: Bilateral;  Latex sensitivity sleep apnea    There were no vitals filed for this visit.   Subjective Assessment - 01/21/22 1636     Subjective Pt reports compliance with HEP. Has had some headaches after exercises but feels like motion has improved. Pain currently: 2/10 NPS.    Pertinent History Pt is a 66 y.o. male referred to PT for cervical and LBP. PMH includes: GERD,  HTN, HLD, lymphoma with radiation and chemo treatments, lymphedema. Pt reports cervical pain is bigger issue than LBP, reports on and off for a couple years, causing head aches and ocular migraines that follow ram's horn distribution, L > R. Pain described as dull, pounding beginning at posterior neck to frontal lobe. Pt endorses radicular symptoms occassionally into L hand in pinky finger and ring fingers. Pt's symptoms are improved with laying flat, Standing. Medication has not been helpful. Pt denies B/B changes, unrelenting night pain, significant weight loss. Symptoms limiting pt from work tasks like computer work, prolonged sititng with work tasks. Pt's worse pain  described as a 5/10 NPS, best pain is 0/10 NPS. Currently 3/10 NPS. Pt's big goal is to improve pain and not have to rely on medications.    Currently in Pain? Yes    Pain Score 2     Pain Location Neck            There.ex:  Reassessment of HEP  Access Code: [email or web address] URL: [email or web address] Date: 01/21/2022 Prepared by: Larna Daughters  Exercises Seated Cervical Retraction - 1 x daily - 7 x weekly - 2 sets - 20 reps Seated Upper Trapezius Stretch - 1 x daily - 7 x weekly - 2 sets - 3 reps - 30 hold Seated Cervical Rotation AROM - 1 x daily - 7 x weekly - 2 sets - 20 reps  Seated suboccipital stretch: 2x15 sec. Added to HEP. Good understanding of form/technique.    Manual therapy: 30 min total with pt in supine  Suboccipital release for pain modulation. 8 min   STM to B upper traps and cervical paraspinals for pain modulation and improved cervical AROM. 15 min  Grade 3 mobs with downward glides C2-C7, 2x5 sec bouts/segment bilat for improve cervical lat flexion and rotation mobility.  R/L trap stretch with contralateral GHJ depression: 2x30 sec         Cervical AROM post session:   Flex: 22 deg  Ext: 35 deg  Rotation: 39 deg/42 deg  R/L Lat flexion: 22 deg/30 deg    PT Education - 01/21/22 1637     Education Details form/technique with exercise.    Person(s) Educated Patient    Methods Explanation;Demonstration;Tactile cues;Verbal cues    Comprehension Verbalized understanding;Returned demonstration              PT Short Term Goals - 01/13/22 2018       PT SHORT TERM GOAL #1   Title Pt will be indep with HEP to demo improvements in pain and mobility to improve functional mobility    Baseline 01/13/22: Initiated    Time 4    Period Weeks    Status New    Target Date 02/10/22               PT Long Term Goals - 01/13/22 2019       PT LONG TERM GOAL #1   Title Pt will improve FOTO to target score to demo clinicially significant  improvement in functional mobility    Baseline 01/13/22: Was set up for lumbar spine; needs to perform cervical    Time 8    Period Weeks    Status New    Target Date 03/10/22      PT LONG TERM GOAL #2   Title Pt will report ability to complete full day of work with </= 2/10 pain via NPS to demonstrate clinically significant improvement in pain with job related tasks.  Baseline 01/13/22: 5/10 NPS    Time 8    Period Weeks    Status New    Target Date 03/10/22      PT LONG TERM GOAL #3   Title Pt will improve cervical AROM by 10 degrees in all planes to improve pain/mobility in neck with head mobility ADL's (driving, scanning environment).    Baseline 1/25: flex: 15, ext: 20, R/L side bend: 12/15, rotation R/L: 30/40    Time 8    Period Weeks    Status New    Target Date 03/10/22                   Plan - 01/21/22 1721     Clinical Impression Statement Pt displaying clinically significant improvement in cervical mobility in all planes due to compliance with HEP and PT treatment today. Heavy focus on manual techniques, re-assessment of HEP, and progression of cervical mobility to assist in deficits. Pt displaying great understanding of HEP. Pt will continue to benefit from skilled PT services to improve pain and cervical mobility to return to PLOF.    Personal Factors and Comorbidities Age;Time since onset of injury/illness/exacerbation;Comorbidity 3+;Profession    Comorbidities GERD, HTN, HLD, lymphoma with radiation and chemo treatments, lymphedema.    Examination-Activity Limitations Hygiene/Grooming;Bend;Locomotion Level;Stand;Sit    Examination-Participation Restrictions Community Activity;Driving;Occupation;Yard Work    Merchant [employer] officer Evolving/Moderate complexity    Rehab Potential Good    PT Frequency 2x / week    PT Duration 8 weeks    PT Treatment/Interventions ADLs/Self Care Home Management;Aquatic Therapy;Cryotherapy;Electrical  Stimulation;Moist Heat;Traction;Functional mobility training;Therapeutic activities;Patient/family education;Therapeutic exercise;Balance training;Neuromuscular re-education;Manual techniques;Passive range of motion;Dry needling;Spinal Manipulations;Joint Manipulations    PT Next Visit Plan Manual techniques thoracic mobility, screen/eval low back if appropriate?    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 06/07/2018   B12 deficiency 03/15/2018   Numbness and tingling of upper and lower extremities of both sides 03/15/2018   Shortness of breath 12/01/2017   Palpitations 12/01/2017   Diplopia 11/24/2017   Chest pain of uncertain etiology 80/99/8338   Paroxysmal supraventricular  tachycardia ([place]) 11/22/2017   Small intestinal bacterial overgrowth 07/26/2017   Hx of splenectomy 05/17/2017   Hx of angioedema 05/17/2017   Fatigue 02/20/2017   Slow transit constipation 02/20/2017   Insomnia due to medical condition 09/30/2015  Post-nasal drip 09/30/2015   Hx of cerebral venous sinus thrombosis 05/18/2015   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   Hx antineoplastic chemotherapy 01/19/2015   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   Herpes zoster 11/07/2014   Angioedema 06/27/2014   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 Fairly IV, PT, DPT Physical Therapist- [facility]  01/21/2022, 5:26 PM  [place] [facility] [address], [place], [address] Phone: [phone]   Fax:  [phone]  Name: Brian Atkins MRN: [id number] Date of Birth: Mar 29, 1956

## 2022-01-25 ENCOUNTER — Ambulatory Visit: Payer: Medicare Other

## 2022-01-28 ENCOUNTER — Ambulatory Visit: Payer: Medicare Other

## 2022-01-28 ENCOUNTER — Other Ambulatory Visit: Payer: Self-pay

## 2022-01-28 DIAGNOSIS — R293 Abnormal posture: Secondary | ICD-10-CM

## 2022-01-28 DIAGNOSIS — M542 Cervicalgia: Secondary | ICD-10-CM | POA: Diagnosis not present

## 2022-01-28 NOTE — Therapy (Signed)
[place] [facility] [address], [place], [address] Phone: [phone]   Fax:  [phone]  Physical Therapy Treatment  Patient Details  Name: Brian Atkins MRN: [id number] Date of Birth: [birth year] Referring Provider (PT): Marybelle Killings, MD   Encounter Date: 01/28/2022   PT End of Session - 01/28/22 1556     Visit Number 3    Number of Visits 17    Date for PT Re-Evaluation 03/10/22    PT Start Time 1552    PT Stop Time 1640    PT Time Calculation (min) 48 min    Activity Tolerance Patient tolerated treatment well    Behavior During Therapy [facility] for tasks assessed/performed             Past Medical History:  Diagnosis Date   Complication of anesthesia    slow to wake   Coronary artery disease, non-occlusive    a. LHC 12/18: ostLAD 20%, p/mLAD 60% FFR 0.84, ost ramus 50%, mRCA 50% FFR 0.94, EF 65%   COVID-19 virus infection 12/28/2019   Diverticulitis    Diverticulosis    Dyslipidemia    Dyspnea    Family history of adverse reaction to anesthesia    Mother - slow to wake   GERD (gastroesophageal reflux disease)    Hemorrhoids    Hiatal hernia    History of 2019 novel coronavirus disease (COVID-19) 12/24/2019   History of echocardiogram    a. TTE 1/19: EF of 60-65%, normal wall motion, normal LV diastolic function, mildly dilated LA   History of kidney stones    Labile hypertension    Lymphoma (HCC)    Medial epicondylitis of right elbow    Meralgia paresthetica of right side    Microscopic hematuria    Ocular migraine    Pneumonia    Sleep apnea    CPAP    Past Surgical History:  Procedure Laterality Date   BRONCHIAL WASHINGS N/A 08/21/2021   Procedure: BRONCHIAL WASHINGS;  Surgeon: Ottie Glazier, MD;  Location: [facility] ORS;  Service: Thoracic;  Laterality: N/A;   COLONOSCOPY     FLEXIBLE BRONCHOSCOPY N/A 08/21/2021   Procedure: FLEXIBLE BRONCHOSCOPY;  Surgeon: Ottie Glazier, MD;  Location:  [facility] ORS;  Service: Thoracic;  Laterality: N/A;   INTRAVASCULAR PRESSURE WIRE/FFR STUDY N/A 12/07/2017   Procedure: INTRAVASCULAR PRESSURE WIRE/FFR STUDY;  Surgeon: Nelva Bush, MD;  Location: [place] CV LAB;  Service: Cardiovascular;  Laterality: N/A;   LEFT HEART CATH AND CORONARY ANGIOGRAPHY Left 12/07/2017   Procedure: LEFT HEART CATH AND CORONARY ANGIOGRAPHY;  Surgeon: Nelva Bush, MD;  Location: [place] CV LAB;  Service: Cardiovascular;  Laterality: Left;   nissen funduplication  5956   [name]   SEPTOPLASTY Bilateral 08/23/2019   Procedure: SEPTOPLASTY;  Surgeon: [name], MD;  Location: [place];  Service: ENT;  Laterality: Bilateral;   SPLENECTOMY     TURBINATE REDUCTION Bilateral 08/23/2019   Procedure: TURBINATE REDUCTION;  Surgeon: [name], MD;  Location: [place];  Service: ENT;  Laterality: Bilateral;  Latex sensitivity sleep apnea    There were no vitals filed for this visit.   Subjective Assessment - 01/28/22 1553     Subjective Pt reports exercises going well. Has been having referred L shoulder pain that is dull and achey in L shoulder raising overhead and sleeping on his side. Headaches are the same, but motion has improved. Neck pain improving.    Pertinent  History Pt is a 66 y.o. male referred to PT for cervical and LBP. PMH includes: GERD, HTN, HLD, lymphoma with radiation and chemo treatments, lymphedema. Pt reports cervical pain is bigger issue than LBP, reports on and off for a couple years, causing head aches and ocular migraines that follow ram's horn distribution, L > R. Pain described as dull, pounding beginning at posterior neck to frontal lobe. Pt endorses radicular symptoms occassionally into L hand in pinky finger and ring fingers. Pt's symptoms are improved with laying flat, Standing. Medication has not been helpful. Pt denies B/B changes, unrelenting night pain, significant weight loss. Symptoms limiting  pt from work tasks like computer work, prolonged sititng with work tasks. Pt's worse pain described as a 5/10 NPS, best pain is 0/10 NPS. Currently 3/10 NPS. Pt's big goal is to improve pain and not have to rely on medications.    Currently in Pain? No/denies             There.ex:   Shoulder screen due to 1 week onset of lateral L shoulder pain and ulnar nerve symptoms  Normal shoulder flexion non painful bilat  Shoulder abduction painful and limited to ~120 deg on LUE. Limited but painless on RUE > 120 deg.   NEERs, open can, empty can and hawkins kennedy (+) on LUE  No reproduction of pain with cervical AROM in all planes. L shoulder ER painless but IR painful  B ULTT performed all negative on R for ulnar, median, and radial. L sided ULTT positive for ulnar bias but negative for median and radial    Cervical AROM:   Flexion: 30 deg  Extension: 30 deg  R/L rotation: 25/22   R/L lat flexion: 39/45   Supine exercises:   Shoulder AAROM with hands clasped together for shoulder pain and promote thoracic extension: x10, painful at end range with ulnar nerve symptoms   Shoulder AAROM abduction with cane, no pain with abduction until end range. AAROM is full compared to AROM. X10.   Seated exercises:   Cervical extension SNAGS: x20 total for cervical extension AROM and joint mobility  R/L cervical rotation SNAGS for joint mobility: x10/direction. Mod multimodal cues initially for form/technique  L sided ulnar nerve flossing. Education on how to bias ulnar nerve while limiting L shoulder abduction which causes shoulder pain. X5.    Scap retractions to promote thoracic extension and upright posture: x20    Education provided on sleeping positions to reduce cervical and shoulder pain and reduce ulnar nerve symptoms. Education on AAROM for shoulder abduction to improve L shoulder abduction pain.    PT Education - 01/28/22 1646     Education Details form/technique with exercise     Person(s) Educated Patient    Methods Explanation;Demonstration;Tactile cues;Verbal cues    Comprehension Verbalized understanding;Returned demonstration              PT Short Term Goals - 01/13/22 2018       PT SHORT TERM GOAL #1   Title Pt will be indep with HEP to demo improvements in pain and mobility to improve functional mobility    Baseline 01/13/22: Initiated    Time 4    Period Weeks    Status New    Target Date 02/10/22               PT Long Term Goals - 01/13/22 2019       PT LONG TERM GOAL #1   Title Pt will improve FOTO  to target score to demo clinicially significant improvement in functional mobility    Baseline 01/13/22: Was set up for lumbar spine; needs to perform cervical    Time 8    Period Weeks    Status New    Target Date 03/10/22      PT LONG TERM GOAL #2   Title Pt will report ability to complete full day of work with </= 2/10 pain via NPS to demonstrate clinically significant improvement in pain with job related tasks.    Baseline 01/13/22: 5/10 NPS    Time 8    Period Weeks    Status New    Target Date 03/10/22      PT LONG TERM GOAL #3   Title Pt will improve cervical AROM by 10 degrees in all planes to improve pain/mobility in neck with head mobility ADL's (driving, scanning environment).    Baseline 1/25: flex: 15, ext: 20, R/L side bend: 12/15, rotation R/L: 30/40    Time 8    Period Weeks    Status New    Target Date 03/10/22                   Plan - 01/28/22 1647     Clinical Impression Statement Focus of session on screening L shoulder and ulnar nerve symptoms due to reports of new onset of L shoulder pain and continuous ulnar nerve symptoms. At eval screening for cervical radiculopathy was negative. Pain reported from uppper shoulder referred to lateral humerus to elbow reported as dull and achey. Ulnar symptoms begin from elbow to pinky and ring finger with N/T with deep elbow flexion and shoulder elevation. Denies  full UE changes to sensation and N/T. ULTT positive for ulnar bias but negative for radial and median. Shoulder impingement tests positive with Neers, Hawkin's kennedy, painful open and empty can testing. Hard to decipher if new onset of shoulder pain is incidental finding or if related to cervical mobility impairments as shoulder pain could not be reproduced with cervical AROM. Would benefit next session with frist rib mobility testing and assessing scalenes/ pec minor involvement based of off pt's posture and cervical mobility. Pt educated improving cervical and thoracic mobility in attempted to improve new onset of shoulder pain. Pt will continue to benefit from skilled PT services to progress cervical mobility to improve pain/function with ADL completion.    Personal Factors and Comorbidities Age;Time since onset of injury/illness/exacerbation;Comorbidity 3+;Profession    Comorbidities GERD, HTN, HLD, lymphoma with radiation and chemo treatments, lymphedema.    Examination-Activity Limitations Hygiene/Grooming;Bend;Locomotion Level;Stand;Sit    Examination-Participation Restrictions Community Activity;Driving;Occupation;Yard Work    Merchant navy officer Evolving/Moderate complexity    Rehab Potential Good    PT Frequency 2x / week    PT Duration 8 weeks    PT Treatment/Interventions ADLs/Self Care Home Management;Aquatic Therapy;Cryotherapy;Electrical Stimulation;Moist Heat;Traction;Functional mobility training;Therapeutic activities;Patient/family education;Therapeutic exercise;Balance training;Neuromuscular re-education;Manual techniques;Passive range of motion;Dry needling;Spinal Manipulations;Joint Manipulations    PT Next Visit Plan Screen first rib mobility and scalenes due to ulnar nerve symptoms in LUE. Manual techniques to cervical spine, thoracic mobility.    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 10/27/2021   Raynaud's disease without gangrene 02/24/2021   Aortic atherosclerosis ([place]) 02/08/2021   Bronchiectasis ([place]) 02/08/2021   Pleural scarring 02/08/2021   COVID toes 01/30/2021   Coronary artery disease involving native coronary artery of native heart without angina pectoris 12/19/2020   Lower extremity edema 12/19/2020   Chronic right shoulder pain 10/24/2020   Cough 09/23/2020   Pain in both lower extremities 03/27/2020   History of COVID-19 02/08/2020   Prediabetes 03/14/2019   New persistent daily headache 03/14/2019   Bilateral inguinal hernia without obstruction or gangrene 09/07/2018   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 06/07/2018   B12 deficiency 03/15/2018   Numbness and tingling of upper and lower extremities of both sides 03/15/2018   Shortness of breath 12/01/2017   Palpitations 12/01/2017   Diplopia 11/24/2017   Chest pain of uncertain etiology 20/09/711   Paroxysmal supraventricular tachycardia ([place]) 11/22/2017   Small intestinal bacterial overgrowth 07/26/2017   Hx of splenectomy 05/17/2017   Hx of angioedema 05/17/2017   Fatigue 02/20/2017   Slow transit constipation 02/20/2017   Insomnia due to medical condition 09/30/2015   Post-nasal drip 09/30/2015   Hx of cerebral venous sinus thrombosis 05/18/2015   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   Hx antineoplastic chemotherapy 01/19/2015   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    Herpes zoster 11/07/2014   Angioedema 06/27/2014   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 Fairly IV, PT, DPT Physical Therapist- [facility]  01/28/2022, 4:58 PM  [place] [facility] [address], [place], [address] Phone: [phone]   Fax:  [phone]  Name: Brian Atkins MRN: [id number] Date of Birth: [birth year]

## 2022-02-01 ENCOUNTER — Ambulatory Visit: Payer: Medicare Other

## 2022-02-04 ENCOUNTER — Other Ambulatory Visit: Payer: Self-pay

## 2022-02-04 ENCOUNTER — Ambulatory Visit: Payer: Medicare Other

## 2022-02-04 DIAGNOSIS — M542 Cervicalgia: Secondary | ICD-10-CM | POA: Diagnosis not present

## 2022-02-04 DIAGNOSIS — R293 Abnormal posture: Secondary | ICD-10-CM

## 2022-02-04 NOTE — Therapy (Signed)
[place] [facility] [address] [place], [address] Phone: [phone]   Fax:  [phone]  Physical Therapy Treatment  Patient Details  Name: Brian Atkins MRN: [id number] Date of Birth: Sep 08, 1956 Referring Provider (PT): Marybelle Killings, MD   Encounter Date: 02/04/2022   PT End of Session - 02/04/22 1551     Visit Number 4    Number of Visits 17    Date for PT Re-Evaluation 03/10/22    PT Start Time 8676    PT Stop Time 1635    PT Time Calculation (min) 44 min    Activity Tolerance Patient tolerated treatment well    Behavior During Therapy [facility] for tasks assessed/performed             Past Medical History:  Diagnosis Date   Complication of anesthesia    slow to wake   Coronary artery disease, non-occlusive    a. LHC 12/18: ostLAD 20%, p/mLAD 60% FFR 0.84, ost ramus 50%, mRCA 50% FFR 0.94, EF 65%   COVID-19 virus infection 12/28/2019   Diverticulitis    Diverticulosis    Dyslipidemia    Dyspnea    Family history of adverse reaction to anesthesia    Mother - slow to wake   GERD (gastroesophageal reflux disease)    Hemorrhoids    Hiatal hernia    History of 2019 novel coronavirus disease (COVID-19) 12/24/2019   History of echocardiogram    a. TTE 1/19: EF of 60-65%, normal wall motion, normal LV diastolic function, mildly dilated LA   History of kidney stones    Labile hypertension    Lymphoma (HCC)    Medial epicondylitis of right elbow    Meralgia paresthetica of right side    Microscopic hematuria    Ocular migraine    Pneumonia    Sleep apnea    CPAP    Past Surgical History:  Procedure Laterality Date   BRONCHIAL WASHINGS N/A 08/21/2021   Procedure: BRONCHIAL WASHINGS;  Surgeon: Ottie Glazier, MD;  Location: [facility];  Service: Thoracic;  Laterality: N/A;   COLONOSCOPY     FLEXIBLE BRONCHOSCOPY N/A 08/21/2021   Procedure: FLEXIBLE BRONCHOSCOPY;  Surgeon: Ottie Glazier, MD;  Location:  [facility];  Service: Thoracic;  Laterality: N/A;   INTRAVASCULAR PRESSURE WIRE/FFR STUDY N/A 12/07/2017   Procedure: INTRAVASCULAR PRESSURE WIRE/FFR STUDY;  Surgeon: Nelva Bush, MD;  Location: [place] [facility];  Service: Cardiovascular;  Laterality: N/A;   LEFT HEART CATH AND CORONARY ANGIOGRAPHY Left 12/07/2017   Procedure: LEFT HEART CATH AND CORONARY ANGIOGRAPHY;  Surgeon: Nelva Bush, MD;  Location: [place] [facility];  Service: Cardiovascular;  Laterality: Left;   nissen funduplication  7209   [name]   SEPTOPLASTY Bilateral 08/23/2019   Procedure: SEPTOPLASTY;  Surgeon: [name], MD;  Location: [place];  Service: ENT;  Laterality: Bilateral;   SPLENECTOMY     TURBINATE REDUCTION Bilateral 08/23/2019   Procedure: TURBINATE REDUCTION;  Surgeon: [name], MD;  Location: [place];  Service: ENT;  Laterality: Bilateral;  Latex sensitivity sleep apnea    There were no vitals filed for this visit.   Subjective Assessment - 02/04/22 1552     Subjective Neck feels like he plateaued. Still feels like he hase L UE ache. Tilted his neck to the L and felt radiating symptoms to his L elbow. 3/10 when looking up 4/10 R rotation, 1/10 L rotation. 3/10 posterior headache currently.  Pertinent History Pt is a 65 y.o. male referred to PT for cervical and LBP. PMH includes: GERD, HTN, HLD, lymphoma with radiation and chemo treatments, lymphedema. Pt reports cervical pain is bigger issue than LBP, reports on and off for a couple years, causing head aches and ocular migraines that follow ram's horn distribution, L > R. Pain described as dull, pounding beginning at posterior neck to frontal lobe. Pt endorses radicular symptoms occassionally into L hand in pinky finger and ring fingers. Pt's symptoms are improved with laying flat, Standing. Medication has not been helpful. Pt denies B/B changes, unrelenting night pain, significant weight loss. Symptoms  limiting pt from work tasks like computer work, prolonged sititng with work tasks. Pt's worse pain described as a 5/10 NPS, best pain is 0/10 NPS. Currently 3/10 NPS. Pt's big goal is to improve pain and not have to rely on medications.    Currently in Pain? Yes    Pain Score 4                                         PT Education - 02/04/22 1823     Education Details ther-ex, HEP    Person(s) Educated Patient    Methods Explanation;Demonstration;Tactile cues;Verbal cues;Handout    Comprehension Returned demonstration;Verbalized understanding             Manual therapy  Seated STM B cervical paraspinal muscles to decreas tension  Seated STM B Upper trap muscles to decrease tension   Reproduced headaches with palpation to L > R upper trap muscle tension   Decreased headache and neck pain afterwards.    There.ex:   Cervical AROM:              R/L rotation: 42/42   Seated B scapular retraction 10x3 with 5 second holds, with chin tuck, emphasis on lower trap muscle activation  Seated thoracic extension stretch at chair 10x5 seconds with feet propped on 4 inch step (to decrease lumbar extension pressure) to decrease cervical extension stress.   Improved exercise technique, movement at target joints, use of target muscles after mod verbal, visual, tactile cues.   Response to treatment  Decreased neck pain and headache reported after sessoin     Clinical impression Pt demonstrates muscle tension B cervical paraspinals and upper trap muscles with reproduction of headaches with palpation to L > R upper trap muscle knots. Decreased headache and neck pain reported with treatment to decrease muscle tension to aforementioned areas. R C5 radiating symptosm with scapular retractions which decreased with addition of cervical retractions/chin tucks to improve R cervical intervertebral foraminal space and to correct forward neck posture and decreased cervical  extension stress to C5/C6 area. Pt will benefit from continued skilled physical therapy services to decrease pain, improve strength and function.             PT Short Term Goals - 01/13/22 2018       PT SHORT TERM GOAL #1   Title Pt will be indep with HEP to demo improvements in pain and mobility to improve functional mobility    Baseline 01/13/22: Initiated    Time 4    Period Weeks    Status New    Target Date 02/10/22               PT Long Term Goals - 01/13/22 2019  PT LONG TERM GOAL #1   Title Pt will improve FOTO to target score to demo clinicially significant improvement in functional mobility    Baseline 01/13/22: Was set up for lumbar spine; needs to perform cervical    Time 8    Period Weeks    Status New    Target Date 03/10/22      PT LONG TERM GOAL #2   Title Pt will report ability to complete full day of work with </= 2/10 pain via NPS to demonstrate clinically significant improvement in pain with job related tasks.    Baseline 01/13/22: 5/10 NPS    Time 8    Period Weeks    Status New    Target Date 03/10/22      PT LONG TERM GOAL #3   Title Pt will improve cervical AROM by 10 degrees in all planes to improve pain/mobility in neck with head mobility ADL's (driving, scanning environment).    Baseline 1/25: flex: 15, ext: 20, R/L side bend: 12/15, rotation R/L: 30/40    Time 8    Period Weeks    Status New    Target Date 03/10/22                   Plan - 02/04/22 1823     Clinical Impression Statement Pt demonstrates muscle tension B cervical paraspinals and upper trap muscles with reproduction of headaches with palpation to L > R upper trap muscle knots. Decreased headache and neck pain reported with treatment to decrease muscle tension to aforementioned areas. R C5 radiating symptosm with scapular retractions which decreased with addition of cervical retractions/chin tucks to improve R cervical intervertebral foraminal space and to  correct forward neck posture and decreased cervical extension stress to C5/C6 area. Pt will benefit from continued skilled physical therapy services to decrease pain, improve strength and function.    Personal Factors and Comorbidities Age;Time since onset of injury/illness/exacerbation;Comorbidity 3+;Profession    Comorbidities GERD, HTN, HLD, lymphoma with radiation and chemo treatments, lymphedema.    Examination-Activity Limitations Hygiene/Grooming;Bend;Locomotion Level;Stand;Sit    Examination-Participation Restrictions Community Activity;Driving;Occupation;Yard Work    Merchant [employer] officer Evolving/Moderate complexity    Rehab Potential Good    PT Frequency 2x / week    PT Duration 8 weeks    PT Treatment/Interventions ADLs/Self Care Home Management;Aquatic Therapy;Cryotherapy;Electrical Stimulation;Moist Heat;Traction;Functional mobility training;Therapeutic activities;Patient/family education;Therapeutic exercise;Balance training;Neuromuscular re-education;Manual techniques;Passive range of motion;Dry needling;Spinal Manipulations;Joint Manipulations    PT Next Visit Plan Screen first rib mobility and scalenes due to ulnar nerve symptoms in LUE. Manual techniques to cervical spine, thoracic mobility.    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 10/27/2021   Raynaud's disease without gangrene 02/24/2021    Aortic atherosclerosis ([place]) 02/08/2021   Bronchiectasis ([place]) 02/08/2021   Pleural scarring 02/08/2021   COVID toes 01/30/2021   Coronary artery disease involving native coronary artery of native heart without angina pectoris 12/19/2020   Lower  extremity edema 12/19/2020   Chronic right shoulder pain 10/24/2020   Cough 09/23/2020   Pain in both lower extremities 03/27/2020   History of COVID-19 02/08/2020   Prediabetes 03/14/2019   New persistent daily headache 03/14/2019   Bilateral inguinal hernia without obstruction or gangrene 09/07/2018   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 06/07/2018   B12 deficiency 03/15/2018   Numbness and tingling of upper and lower extremities of both sides 03/15/2018   Shortness of breath 12/01/2017   Palpitations 12/01/2017   Diplopia 11/24/2017   Chest pain of uncertain etiology 73/71/0626   Paroxysmal supraventricular tachycardia ([place]) 11/22/2017   Small intestinal bacterial overgrowth 07/26/2017   Hx of splenectomy 05/17/2017   Hx of angioedema 05/17/2017   Fatigue 02/20/2017   Slow transit constipation 02/20/2017   Insomnia due to medical condition 09/30/2015   Post-nasal drip 09/30/2015   Hx of cerebral venous sinus thrombosis 05/18/2015   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   Hx antineoplastic chemotherapy 01/19/2015   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   Herpes zoster 11/07/2014   Angioedema 06/27/2014   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 [place], [address] Phone: [phone]   Fax:  [phone]  Name: ARISH REDNER MRN: [id number] Date of Birth: 1956-12-06

## 2022-02-04 NOTE — Patient Instructions (Signed)
Access Code: [email or web address] URL: [email or web address] Date: 02/04/2022 Prepared by: Joneen Boers  Exercises Seated Cervical Retraction - 1 x daily - 7 x weekly - 2 sets - 20 reps Seated Upper Trapezius Stretch - 1 x daily - 7 x weekly - 2 sets - 3 reps - 30 hold Seated Cervical Rotation AROM - 1 x daily - 7 x weekly - 2 sets - 20 reps Sub-Occipital Cervical Stretch - 1 x daily - 7 x weekly - 1 sets - 2-3 reps - 15-20 hold Seated Scapular Retraction - 1 x daily - 7 x weekly - 3 sets - 10 reps - 5 seconds hold Seated Flexion Stretch - 3 x daily - 7 x weekly - 1 sets - 3 reps - 30 seconds hold

## 2022-02-08 ENCOUNTER — Ambulatory Visit: Payer: Medicare Other

## 2022-02-08 ENCOUNTER — Other Ambulatory Visit: Payer: Self-pay

## 2022-02-08 DIAGNOSIS — M542 Cervicalgia: Secondary | ICD-10-CM

## 2022-02-08 DIAGNOSIS — R293 Abnormal posture: Secondary | ICD-10-CM

## 2022-02-08 NOTE — Therapy (Signed)
[place] [facility] [address], [place], [address] Phone: [phone]   Fax:  [phone]  Physical Therapy Treatment  Patient Details  Name: Brian Atkins MRN: [id number] Date of Birth: Nov 27, 1956 Referring Provider (PT): Marybelle Killings, MD   Encounter Date: 02/08/2022   PT End of Session - 02/08/22 1553     Visit Number 5    Number of Visits 17    Date for PT Re-Evaluation 03/10/22    PT Start Time 1219    PT Stop Time 1628    PT Time Calculation (min) 35 min    Activity Tolerance Patient tolerated treatment well    Behavior During Therapy [facility] for tasks assessed/performed             Past Medical History:  Diagnosis Date   Complication of anesthesia    slow to wake   Coronary artery disease, non-occlusive    a. LHC 12/18: ostLAD 20%, p/mLAD 60% FFR 0.84, ost ramus 50%, mRCA 50% FFR 0.94, EF 65%   COVID-19 virus infection 12/28/2019   Diverticulitis    Diverticulosis    Dyslipidemia    Dyspnea    Family history of adverse reaction to anesthesia    Mother - slow to wake   GERD (gastroesophageal reflux disease)    Hemorrhoids    Hiatal hernia    History of 2019 novel coronavirus disease (COVID-19) 12/24/2019   History of echocardiogram    a. TTE 1/19: EF of 60-65%, normal wall motion, normal LV diastolic function, mildly dilated LA   History of kidney stones    Labile hypertension    Lymphoma (HCC)    Medial epicondylitis of right elbow    Meralgia paresthetica of right side    Microscopic hematuria    Ocular migraine    Pneumonia    Sleep apnea    CPAP    Past Surgical History:  Procedure Laterality Date   BRONCHIAL WASHINGS N/A 08/21/2021   Procedure: BRONCHIAL WASHINGS;  Surgeon: Ottie Glazier, MD;  Location: [facility];  Service: Thoracic;  Laterality: N/A;   COLONOSCOPY     FLEXIBLE BRONCHOSCOPY N/A 08/21/2021   Procedure: FLEXIBLE BRONCHOSCOPY;  Surgeon: Ottie Glazier, MD;  Location:  [facility];  Service: Thoracic;  Laterality: N/A;   INTRAVASCULAR PRESSURE WIRE/FFR STUDY N/A 12/07/2017   Procedure: INTRAVASCULAR PRESSURE WIRE/FFR STUDY;  Surgeon: Nelva Bush, MD;  Location: [place] [facility];  Service: Cardiovascular;  Laterality: N/A;   LEFT HEART CATH AND CORONARY ANGIOGRAPHY Left 12/07/2017   Procedure: LEFT HEART CATH AND CORONARY ANGIOGRAPHY;  Surgeon: Nelva Bush, MD;  Location: [place];  Service: Cardiovascular;  Laterality: Left;   nissen funduplication  7588   [name]   SEPTOPLASTY Bilateral 08/23/2019   Procedure: SEPTOPLASTY;  Surgeon: [name], MD;  Location: [place];  Service: ENT;  Laterality: Bilateral;   SPLENECTOMY     TURBINATE REDUCTION Bilateral 08/23/2019   Procedure: TURBINATE REDUCTION;  Surgeon: [name], MD;  Location: [place];  Service: ENT;  Laterality: Bilateral;  Latex sensitivity sleep apnea    There were no vitals filed for this visit.   Subjective Assessment - 02/08/22 1554     Subjective Neck was better after last session but pain returned. L shoulder feels like its popping. 3/10 neck pain currently (took tylenol. usually takes tylenol or NSAIDs). 4/10 L shoulder joint when he moves it.  No headache currently (took tylenol before last  session as well).    Pertinent History Pt is a 66 y.o. male referred to PT for cervical and LBP. PMH includes: GERD, HTN, HLD, lymphoma with radiation and chemo treatments, lymphedema. Pt reports cervical pain is bigger issue than LBP, reports on and off for a couple years, causing head aches and ocular migraines that follow ram's horn distribution, L > R. Pain described as dull, pounding beginning at posterior neck to frontal lobe. Pt endorses radicular symptoms occassionally into L hand in pinky finger and ring fingers. Pt's symptoms are improved with laying flat, Standing. Medication has not been helpful. Pt denies B/B changes, unrelenting night  pain, significant weight loss. Symptoms limiting pt from work tasks like computer work, prolonged sititng with work tasks. Pt's worse pain described as a 5/10 NPS, best pain is 0/10 NPS. Currently 3/10 NPS. Pt's big goal is to improve pain and not have to rely on medications.    Currently in Pain? Yes    Pain Score 3     Pain Location Neck                                        PT Education - 02/08/22 1559     Education Details ther-ex    Person(s) Educated Patient    Methods Explanation;Demonstration;Tactile cues;Verbal cues    Comprehension Returned demonstration;Verbalized understanding              Manual therapy    Seated STM B cervical paraspinal muscles R > L  to decrease tension  Seated STM B Upper trap muscles to decrease tension              Decreased neck pain afterwards.      There-ex:    Standing B scapular retraction blue band 10x5 seconds for 3 sets  Standing doorway pectoralis stretch. L shoulder discomfort.   Supine open books 5 seconds x 3. L shoulder discomfort.   Supine B scapular retraction, arms neutral 15 seconds x 5 to promote pectoralis muscle stretch   Hooklying chin tucks 10x5 seconds for 3 sets  No neck pain after aforementioned exercises. Just stiffness. Decreased L shoulder pain with movement.     Improved exercise technique, movement at target joints, use of target muscles after mod verbal, visual, tactile cues.    Response to treatment  No neck pain reported after sessoin        Clinical impression Improved headaches based on no reports of headache currently with keeping the variable of taking tylenol prior to last session and prior to today's session the same. Continued working on improving lower trap strength, improving scapular retraction and posterior tipping to decrease upper trap muscle tension to his neck. No neck pain reported after session. Pt will benefit from continued skilled physical therapy  services to decrease pain, improve strength and function.       PT Short Term Goals - 01/13/22 2018       PT SHORT TERM GOAL #1   Title Pt will be indep with HEP to demo improvements in pain and mobility to improve functional mobility    Baseline 01/13/22: Initiated    Time 4    Period Weeks    Status New    Target Date 02/10/22               PT Long Term Goals - 01/13/22 2019  PT LONG TERM GOAL #1   Title Pt will improve FOTO to target score to demo clinicially significant improvement in functional mobility    Baseline 01/13/22: Was set up for lumbar spine; needs to perform cervical    Time 8    Period Weeks    Status New    Target Date 03/10/22      PT LONG TERM GOAL #2   Title Pt will report ability to complete full day of work with </= 2/10 pain via NPS to demonstrate clinically significant improvement in pain with job related tasks.    Baseline 01/13/22: 5/10 NPS    Time 8    Period Weeks    Status New    Target Date 03/10/22      PT LONG TERM GOAL #3   Title Pt will improve cervical AROM by 10 degrees in all planes to improve pain/mobility in neck with head mobility ADL's (driving, scanning environment).    Baseline 1/25: flex: 15, ext: 20, R/L side bend: 12/15, rotation R/L: 30/40    Time 8    Period Weeks    Status New    Target Date 03/10/22                   Plan - 02/08/22 1625     Clinical Impression Statement Improved headaches based on no reports of headache currently with keeping the variable of taking tylenol prior to last session and prior to today's session the same. Continued working on improving lower trap strength, improving scapular retraction and posterior tipping to decrease upper trap muscle tension to his neck. No neck pain reported after session. Pt will benefit from continued skilled physical therapy services to decrease pain, improve strength and function.    Personal Factors and Comorbidities Age;Time since onset of  injury/illness/exacerbation;Comorbidity 3+;Profession    Comorbidities GERD, HTN, HLD, lymphoma with radiation and chemo treatments, lymphedema.    Examination-Activity Limitations Hygiene/Grooming;Bend;Locomotion Level;Stand;Sit    Examination-Participation Restrictions Community Activity;Driving;Occupation;Yard Work    Stability/Clinical Decision Making Stable/Uncomplicated    Designer, jewellery Low    Rehab Potential Good    PT Frequency 2x / week    PT Duration 8 weeks    PT Treatment/Interventions ADLs/Self Care Home Management;Aquatic Therapy;Cryotherapy;Electrical Stimulation;Moist Heat;Traction;Functional mobility training;Therapeutic activities;Patient/family education;Therapeutic exercise;Balance training;Neuromuscular re-education;Manual techniques;Passive range of motion;Dry needling;Spinal Manipulations;Joint Manipulations    PT Next Visit Plan Screen first rib mobility and scalenes due to ulnar nerve symptoms in LUE. Manual techniques to cervical spine, thoracic mobility.    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 10/27/2021   Raynaud's disease without gangrene 02/24/2021   Aortic atherosclerosis ([place]) 02/08/2021   Bronchiectasis ([place]) 02/08/2021   Pleural scarring 02/08/2021   COVID toes 01/30/2021   Coronary artery disease involving native coronary artery of native heart without angina pectoris  12/19/2020   Lower extremity edema 12/19/2020   Chronic right shoulder pain 10/24/2020   Cough 09/23/2020  Pain in both lower extremities 03/27/2020   History of COVID-19 02/08/2020   Prediabetes 03/14/2019   New persistent daily headache 03/14/2019   Bilateral inguinal hernia without obstruction or gangrene 09/07/2018   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 06/07/2018   B12 deficiency 03/15/2018   Numbness and tingling of upper and lower extremities of both sides 03/15/2018   Shortness of breath 12/01/2017   Palpitations 12/01/2017   Diplopia 11/24/2017   Chest pain of uncertain etiology 81/15/7262   Paroxysmal supraventricular tachycardia ([place]) 11/22/2017   Small intestinal bacterial overgrowth 07/26/2017   Hx of splenectomy 05/17/2017   Hx of angioedema 05/17/2017   Fatigue 02/20/2017   Slow transit constipation 02/20/2017   Insomnia due to medical condition 09/30/2015   Post-nasal drip 09/30/2015   Hx of cerebral venous sinus thrombosis 05/18/2015   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   Hx antineoplastic chemotherapy 01/19/2015   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   Herpes zoster 11/07/2014   Angioedema 06/27/2014   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, [place], [address] Phone: [phone]   Fax:  [phone]  Name: Brian Atkins MRN: [id number] Date of Birth: 12-13-1956

## 2022-02-10 ENCOUNTER — Ambulatory Visit: Payer: Medicare Other

## 2022-02-15 ENCOUNTER — Other Ambulatory Visit: Payer: Self-pay

## 2022-02-15 ENCOUNTER — Ambulatory Visit: Payer: Medicare Other

## 2022-02-15 DIAGNOSIS — M542 Cervicalgia: Secondary | ICD-10-CM

## 2022-02-15 DIAGNOSIS — R293 Abnormal posture: Secondary | ICD-10-CM

## 2022-02-15 NOTE — Therapy (Signed)
[place] [facility] [address], [place], [address] Phone: [phone]   Fax:  [phone]  Physical Therapy Treatment  Patient Details  Name: Brian Atkins MRN: [id number] Date of Birth: 05-28-1956 Referring Provider (PT): Marybelle Killings, MD   Encounter Date: 02/15/2022   PT End of Session - 02/15/22 1551     Visit Number 6    Number of Visits 17    Date for PT Re-Evaluation 03/10/22    PT Start Time 5053    PT Stop Time 1636    PT Time Calculation (min) 42 min    Activity Tolerance Patient tolerated treatment well    Behavior During Therapy [facility] for tasks assessed/performed             Past Medical History:  Diagnosis Date   Complication of anesthesia    slow to wake   Coronary artery disease, non-occlusive    a. LHC 12/18: ostLAD 20%, p/mLAD 60% FFR 0.84, ost ramus 50%, mRCA 50% FFR 0.94, EF 65%   COVID-19 virus infection 12/28/2019   Diverticulitis    Diverticulosis    Dyslipidemia    Dyspnea    Family history of adverse reaction to anesthesia    Mother - slow to wake   GERD (gastroesophageal reflux disease)    Hemorrhoids    Hiatal hernia    History of 2019 novel coronavirus disease (COVID-19) 12/24/2019   History of echocardiogram    a. TTE 1/19: EF of 60-65%, normal wall motion, normal LV diastolic function, mildly dilated LA   History of kidney stones    Labile hypertension    Lymphoma (HCC)    Medial epicondylitis of right elbow    Meralgia paresthetica of right side    Microscopic hematuria    Ocular migraine    Pneumonia    Sleep apnea    CPAP    Past Surgical History:  Procedure Laterality Date   BRONCHIAL WASHINGS N/A 08/21/2021   Procedure: BRONCHIAL WASHINGS;  Surgeon: Ottie Glazier, MD;  Location: [facility];  Service: Thoracic;  Laterality: N/A;   COLONOSCOPY     FLEXIBLE BRONCHOSCOPY N/A 08/21/2021   Procedure: FLEXIBLE BRONCHOSCOPY;  Surgeon: Ottie Glazier, MD;  Location:  [facility];  Service: Thoracic;  Laterality: N/A;   INTRAVASCULAR PRESSURE WIRE/FFR STUDY N/A 12/07/2017   Procedure: INTRAVASCULAR PRESSURE WIRE/FFR STUDY;  Surgeon: Nelva Bush, MD;  Location: [place] [facility];  Service: Cardiovascular;  Laterality: N/A;   LEFT HEART CATH AND CORONARY ANGIOGRAPHY Left 12/07/2017   Procedure: LEFT HEART CATH AND CORONARY ANGIOGRAPHY;  Surgeon: Nelva Bush, MD;  Location: [place] [facility];  Service: Cardiovascular;  Laterality: Left;   nissen funduplication  [id number]   [name]   SEPTOPLASTY Bilateral 08/23/2019   Procedure: SEPTOPLASTY;  Surgeon: [name], MD;  Location: [place];  Service: ENT;  Laterality: Bilateral;   SPLENECTOMY     TURBINATE REDUCTION Bilateral 08/23/2019   Procedure: TURBINATE REDUCTION;  Surgeon: [name], MD;  Location: [place];  Service: ENT;  Laterality: Bilateral;  Latex sensitivity sleep apnea    There were no vitals filed for this visit.   Subjective Assessment - 02/15/22 1555     Subjective Hurt his back Friday getting laundry out of the dryer. Low back is a little better. Was not able to do much after last session because of his back. 3-4/10 neck pain currently, 3/10 neck pain currently.    Pertinent History  Pt is a 66 y.o. male referred to PT for cervical and LBP. PMH includes: GERD, HTN, HLD, lymphoma with radiation and chemo treatments, lymphedema. Pt reports cervical pain is bigger issue than LBP, reports on and off for a couple years, causing head aches and ocular migraines that follow ram's horn distribution, L > R. Pain described as dull, pounding beginning at posterior neck to frontal lobe. Pt endorses radicular symptoms occassionally into L hand in pinky finger and ring fingers. Pt's symptoms are improved with laying flat, Standing. Medication has not been helpful. Pt denies B/B changes, unrelenting night pain, significant weight loss. Symptoms limiting pt from work tasks  like computer work, prolonged sititng with work tasks. Pt's worse pain described as a 5/10 NPS, best pain is 0/10 NPS. Currently 3/10 NPS. Pt's big goal is to improve pain and not have to rely on medications.    Currently in Pain? Yes    Pain Score 4     Pain Location Neck                                        PT Education - 02/15/22 1606     Education Details ther-ex    Person(s) Educated Patient    Methods Explanation;Demonstration;Tactile cues;Verbal cues    Comprehension Returned demonstration;Verbalized understanding             Manual therapy    Seated STM B cervical paraspinal muscles  to decrease tension   Seated STM B upper trap muscles to decrease tension             Decreased neck pain afterwards.    Seated STM L lumbar paraspinal muscles to decrease tension.  Decreased back pain reported afterwards.    There-ex:    Sitting with lumbar towel roll (improved low back comfort):  Seated B scapular retraction with transversus abdominis contraction 10x5 seconds for 3 sets   Trunk extension in sitting 10x5 seconds for 3 sets  Sitting neck posture: R lateral shift  Seated manually resisted L lateral shift isometrics for neck in neutral to decrease R lateral shift posture 10x3 with 5 second holds  Seated L first rib stretch with PT manual caudal pressure to L first rib area 15 seconds x 5  Decreased neck pain     Improved exercise technique, movement at target joints, use of target muscles after mod verbal, visual, tactile cues.    Response to treatment 2/10 neck and headache after sessoin        Clinical impression Decreased neck pain and headache with treatment to decrease cervical paraspinal, upper trap and L scalene muscle tension. Worked on decreasing back pain to improve comfort level in performing his neck home exercises. Decreased neck pain and headache to 2/10 and decreased back pain reported after session. Pt will  benefit from continued skilled physical therapy services to decrease pain, improve strength and function.     PT Short Term Goals - 01/13/22 2018       PT SHORT TERM GOAL #1   Title Pt will be indep with HEP to demo improvements in pain and mobility to improve functional mobility    Baseline 01/13/22: Initiated    Time 4    Period Weeks    Status New    Target Date 02/10/22               PT Long Term  Goals - 01/13/22 2019       PT LONG TERM GOAL #1   Title Pt will improve FOTO to target score to demo clinicially significant improvement in functional mobility    Baseline 01/13/22: Was set up for lumbar spine; needs to perform cervical    Time 8    Period Weeks    Status New    Target Date 03/10/22      PT LONG TERM GOAL #2   Title Pt will report ability to complete full day of work with </= 2/10 pain via NPS to demonstrate clinically significant improvement in pain with job related tasks.    Baseline 01/13/22: 5/10 NPS    Time 8    Period Weeks    Status New    Target Date 03/10/22      PT LONG TERM GOAL #3   Title Pt will improve cervical AROM by 10 degrees in all planes to improve pain/mobility in neck with head mobility ADL's (driving, scanning environment).    Baseline 1/25: flex: 15, ext: 20, R/L side bend: 12/15, rotation R/L: 30/40    Time 8    Period Weeks    Status New    Target Date 03/10/22                   Plan - 02/15/22 1606     Clinical Impression Statement Decreased neck pain and headache with treatment to decrease cervical paraspinal, upper trap and L scalene muscle tension. Worked on decreasing back pain to improve comfort level in performing his neck home exercises. Decreased neck pain and headache to 2/10 and decreased back pain reported after session. Pt will benefit from continued skilled physical therapy services to decrease pain, improve strength and function.    Personal Factors and Comorbidities Age;Time since onset of  injury/illness/exacerbation;Comorbidity 3+;Profession    Comorbidities GERD, HTN, HLD, lymphoma with radiation and chemo treatments, lymphedema.    Examination-Activity Limitations Hygiene/Grooming;Bend;Locomotion Level;Stand;Sit    Examination-Participation Restrictions Community Activity;Driving;Occupation;Yard Work    Stability/Clinical Decision Making Stable/Uncomplicated    Rehab Potential Good    PT Frequency 2x / week    PT Duration 8 weeks    PT Treatment/Interventions ADLs/Self Care Home Management;Aquatic Therapy;Cryotherapy;Electrical Stimulation;Moist Heat;Traction;Functional mobility training;Therapeutic activities;Patient/family education;Therapeutic exercise;Balance training;Neuromuscular re-education;Manual techniques;Passive range of motion;Dry needling;Spinal Manipulations;Joint Manipulations    PT Next Visit Plan Screen first rib mobility and scalenes due to ulnar nerve symptoms in LUE. Manual techniques to cervical spine, thoracic mobility.    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 10/27/2021   Raynaud's disease without gangrene 02/24/2021   Aortic atherosclerosis ([place]) 02/08/2021   Bronchiectasis ([place]) 02/08/2021   Pleural scarring 02/08/2021   COVID toes 01/30/2021   Coronary artery disease involving native coronary artery of native heart without angina pectoris 12/19/2020   Lower extremity  edema 12/19/2020   Chronic right shoulder pain 10/24/2020   Cough 09/23/2020   Pain in both lower  extremities 03/27/2020   History of COVID-19 02/08/2020   Prediabetes 03/14/2019   New persistent daily headache 03/14/2019   Bilateral inguinal hernia without obstruction or gangrene 09/07/2018   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 06/07/2018   B12 deficiency 03/15/2018   Numbness and tingling of upper and lower extremities of both sides 03/15/2018   Shortness of breath 12/01/2017   Palpitations 12/01/2017   Diplopia 11/24/2017   Chest pain of uncertain etiology 02/72/5366   Paroxysmal supraventricular tachycardia ([place]) 11/22/2017   Small intestinal bacterial overgrowth 07/26/2017   Hx of splenectomy 05/17/2017   Hx of angioedema 05/17/2017   Fatigue 02/20/2017   Slow transit constipation 02/20/2017   Insomnia due to medical condition 09/30/2015   Post-nasal drip 09/30/2015   Hx of cerebral venous sinus thrombosis 05/18/2015   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   Hx antineoplastic chemotherapy 01/19/2015   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   Herpes zoster 11/07/2014   Angioedema 06/27/2014   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 [place], [address] Phone: [phone]   Fax:  [phone]  Name: MAISON KESTENBAUM MRN: [id number] Date of  Birth: 03-23-1956

## 2022-02-17 ENCOUNTER — Ambulatory Visit: Payer: Medicare Other | Attending: Orthopaedic Surgery

## 2022-02-17 DIAGNOSIS — R293 Abnormal posture: Secondary | ICD-10-CM | POA: Insufficient documentation

## 2022-02-17 DIAGNOSIS — M542 Cervicalgia: Secondary | ICD-10-CM | POA: Insufficient documentation

## 2022-02-23 ENCOUNTER — Ambulatory Visit: Payer: Medicare Other

## 2022-02-23 ENCOUNTER — Other Ambulatory Visit: Payer: Self-pay

## 2022-02-23 DIAGNOSIS — R293 Abnormal posture: Secondary | ICD-10-CM | POA: Diagnosis present

## 2022-02-23 DIAGNOSIS — M542 Cervicalgia: Secondary | ICD-10-CM | POA: Diagnosis not present

## 2022-02-23 NOTE — Therapy (Signed)
[place]  [facility] [address] [place], [address] Phone: [phone]   Fax:  [phone]  Physical Therapy Treatment  Patient Details  Name: Brian Atkins MRN: [id number] Date of Birth: 29-Oct-1956 Referring Provider (PT): Marybelle Killings, MD   Encounter Date: 02/23/2022   PT End of Session - 02/23/22 1637     Visit Number 7    Number of Visits 17    Date for PT Re-Evaluation 03/10/22    PT Start Time 1638    PT Stop Time 1720    PT Time Calculation (min) 42 min    Activity Tolerance Patient tolerated treatment well    Behavior During Therapy [facility] for tasks assessed/performed             Past Medical History:  Diagnosis Date   Complication of anesthesia    slow to wake   Coronary artery disease, non-occlusive    a. LHC 12/18: ostLAD 20%, p/mLAD 60% FFR 0.84, ost ramus 50%, mRCA 50% FFR 0.94, EF 65%   COVID-19 virus infection 12/28/2019   Diverticulitis    Diverticulosis    Dyslipidemia    Dyspnea    Family history of adverse reaction to anesthesia    Mother - slow to wake   GERD (gastroesophageal reflux disease)    Hemorrhoids    Hiatal hernia    History of 2019 novel coronavirus disease (COVID-19) 12/24/2019   History of echocardiogram    a. TTE 1/19: EF of 60-65%, normal wall motion, normal LV diastolic function, mildly dilated LA   History of kidney stones    Labile hypertension    Lymphoma (HCC)    Medial epicondylitis of right elbow    Meralgia paresthetica of right side    Microscopic hematuria    Ocular migraine    Pneumonia    Sleep apnea    CPAP    Past Surgical History:  Procedure Laterality Date   BRONCHIAL WASHINGS N/A 08/21/2021   Procedure: BRONCHIAL WASHINGS;  Surgeon: Ottie Glazier, MD;  Location: [facility];  Service: Thoracic;  Laterality: N/A;   COLONOSCOPY     FLEXIBLE BRONCHOSCOPY N/A 08/21/2021   Procedure: FLEXIBLE BRONCHOSCOPY;  Surgeon: Ottie Glazier, MD;  Location:  [facility];  Service: Thoracic;  Laterality: N/A;   INTRAVASCULAR PRESSURE WIRE/FFR STUDY N/A 12/07/2017   Procedure: INTRAVASCULAR PRESSURE WIRE/FFR STUDY;  Surgeon: Nelva Bush, MD;  Location: [place] [facility];  Service: Cardiovascular;  Laterality: N/A;   LEFT HEART CATH AND CORONARY ANGIOGRAPHY Left 12/07/2017   Procedure: LEFT HEART CATH AND CORONARY ANGIOGRAPHY;  Surgeon: Nelva Bush, MD;  Location: [place] [facility];  Service: Cardiovascular;  Laterality: Left;   nissen funduplication  1962   [name]   SEPTOPLASTY Bilateral 08/23/2019   Procedure: SEPTOPLASTY;  Surgeon: [name], MD;  Location: [place];  Service: ENT;  Laterality: Bilateral;   SPLENECTOMY     TURBINATE REDUCTION Bilateral 08/23/2019   Procedure: TURBINATE REDUCTION;  Surgeon: [name], MD;  Location: [place];  Service: ENT;  Laterality: Bilateral;  Latex sensitivity sleep apnea    There were no vitals filed for this visit.   Subjective Assessment - 02/23/22 1639     Subjective Hurt his back again yesterday pulling on a stearing system. L shoulder also bothers him. Neck is pretty decent with the motion. Still having the headaches. Normally takes tylenol. 3/10 neck pain wiht R cervical rotation, L cervical rotation does not really  bother his neck. 2/10 headache currently. 3/10 sitting in good posture, 5/10 when slumping.    Pertinent History Pt is a 66 y.o. male referred to PT for cervical and LBP. PMH includes: GERD, HTN, HLD, lymphoma with radiation and chemo treatments, lymphedema. Pt reports cervical pain is bigger issue than LBP, reports on and off for a couple years, causing head aches and ocular migraines that follow ram's horn distribution, L > R. Pain described as dull, pounding beginning at posterior neck to frontal lobe. Pt endorses radicular symptoms occassionally into L hand in pinky finger and ring fingers. Pt's symptoms are improved with laying flat,  Standing. Medication has not been helpful. Pt denies B/B changes, unrelenting night pain, significant weight loss. Symptoms limiting pt from work tasks like computer work, prolonged sititng with work tasks. Pt's worse pain described as a 5/10 NPS, best pain is 0/10 NPS. Currently 3/10 NPS. Pt's big goal is to improve pain and not have to rely on medications.    Currently in Pain? Yes                [facility] PT Assessment - 02/23/22 1642       Posture/Postural Control   Posture Comments R thoracolumbar convexity.      AROM   Lumbar Flexion very limited. Tight B lumbar paraspinal muscles    Lumbar Extension limited (feels better compared to lumbar flexion)    Lumbar - Right Side Bend limited with L posterior lateral hip pull    Lumbar - Left Side Bend limited with R posterior lateral hip pull    Lumbar - Right Rotation limited with L low back discomfort    Lumbar - Left Rotation limited with L low back discomfort                                       Ther-ex:    Standing lumbar AROM all planes  Seated manually resisted L lumbar lateral shift isometrics, hand at L lumbar area to counter R thoracolumbar convexity posture  10x5 seconds for 3 sets   Standing back extension 10x5 seconds   Seated manually resisted L lumbar lateral shift isometrics, hand at L lumbar area to counter R thoracolumbar convexity posture  And R upper trunk lateral shift to counter L thoracic lateral shift posture  10x5 seconds for 3 sets   Seated manually resisted trunk extension isometrics in proper posture with PT manual resistance 10x5 seconds for 2 sets  Seated B scapular retraction with chin tucks 10x5 seconds for 3 sets to promote extension throughout spine and to decrease extension pressure to neck.   Decreased neck pain with R rotation    Improved exercise technique, movement at target joints, use of target muscles after mod verbal, visual, tactile cues.    Response  to treatment Decreased low back pain to 2/10 when slouching, 0/10 in upright posture after session. Decreased neck pain to 1/10 with R cervical rotation with scapular retraction and chin tucks after session. Decreased headache after session to 1/10.         Clinical impression Worked on decreasing low back pain secondary to pain affecting pt ability to perform HEP for his cervical rehab based on subjective reports as well as to promote better foundation for scapular and cervical muscles to function/activate. Decreased back pain with treatment to promote proper posture. Decreased low back pain to 2/10 when slouching, 0/10  in upright posture after session. Decreased neck pain to 1/10 with R cervical rotation with scapular retraction and chin tucks after session. Decreased headache after session to 1/10. Pt will benefit from continued skilled physical therapy services to decrease pain, improve strength and function.      PT Short Term Goals - 01/13/22 2018       PT SHORT TERM GOAL #1   Title Pt will be indep with HEP to demo improvements in pain and mobility to improve functional mobility    Baseline 01/13/22: Initiated    Time 4    Period Weeks    Status New    Target Date 02/10/22               PT Long Term Goals - 01/13/22 2019       PT LONG TERM GOAL #1   Title Pt will improve FOTO to target score to demo clinicially significant improvement in functional mobility    Baseline 01/13/22: Was set up for lumbar spine; needs to perform cervical    Time 8    Period Weeks    Status New    Target Date 03/10/22      PT LONG TERM GOAL #2   Title Pt will report ability to complete full day of work with </= 2/10 pain via NPS to demonstrate clinically significant improvement in pain with job related tasks.    Baseline 01/13/22: 5/10 NPS    Time 8    Period Weeks    Status New    Target Date 03/10/22      PT LONG TERM GOAL #3   Title Pt will improve cervical AROM by 10 degrees in all  planes to improve pain/mobility in neck with head mobility ADL's (driving, scanning environment).    Baseline 1/25: flex: 15, ext: 20, R/L side bend: 12/15, rotation R/L: 30/40    Time 8    Period Weeks    Status New    Target Date 03/10/22                   Plan - 02/23/22 1721     Clinical Impression Statement Worked on decreasing low back pain secondary to pain affecting pt ability to perform HEP for his cervical rehab based on subjective reports as well as to promote better foundation for scapular and cervical muscles to function/activate. Decreased back pain with treatment to promote proper posture. Decreased low back pain to 2/10 when slouching, 0/10 in upright posture after session. Decreased neck pain to 1/10 with R cervical rotation with scapular retraction and chin tucks after session. Decreased headache after session to 1/10. Pt will benefit from continued skilled physical therapy services to decrease pain, improve strength and function.    Personal Factors and Comorbidities Age;Time since onset of injury/illness/exacerbation;Comorbidity 3+;Profession    Comorbidities GERD, HTN, HLD, lymphoma with radiation and chemo treatments, lymphedema.    Examination-Activity Limitations Hygiene/Grooming;Bend;Locomotion Level;Stand;Sit    Examination-Participation Restrictions Community Activity;Driving;Occupation;Yard Work    Stability/Clinical Decision Making Stable/Uncomplicated    Designer, [employer]    Rehab Potential Good    PT Frequency 2x / week    PT Duration 8 weeks    PT Treatment/Interventions ADLs/Self Care Home Management;Aquatic Therapy;Cryotherapy;Electrical Stimulation;Moist Heat;Traction;Functional mobility training;Therapeutic activities;Patient/family education;Therapeutic exercise;Balance training;Neuromuscular re-education;Manual techniques;Passive range of motion;Dry needling;Spinal Manipulations;Joint Manipulations    PT Next Visit Plan Screen first rib  mobility and scalenes due to ulnar nerve symptoms in LUE. Manual techniques to cervical spine, thoracic  mobility.    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 10/27/2021   Raynaud's disease without gangrene 02/24/2021   Aortic atherosclerosis ([place]) 02/08/2021   Bronchiectasis ([place]) 02/08/2021   Pleural scarring 02/08/2021   COVID toes 01/30/2021   Coronary artery disease involving native coronary artery of native heart without angina pectoris 12/19/2020   Lower extremity edema 12/19/2020   Chronic right shoulder pain 10/24/2020   Cough 09/23/2020   Pain in both lower extremities 03/27/2020   History of COVID-19 02/08/2020   Prediabetes 03/14/2019   New persistent daily headache 03/14/2019   Bilateral inguinal hernia without obstruction or gangrene 09/07/2018   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 06/07/2018   B12 deficiency 03/15/2018   Numbness and tingling of upper and lower extremities of both sides 03/15/2018   Shortness of breath 12/01/2017   Palpitations 12/01/2017   Diplopia 11/24/2017   Chest pain of uncertain etiology 81/85/6314   Paroxysmal supraventricular tachycardia ([place]) 11/22/2017   Small intestinal bacterial overgrowth 07/26/2017   Hx of splenectomy 05/17/2017   Hx of angioedema 05/17/2017   Fatigue 02/20/2017   Slow transit constipation 02/20/2017   Insomnia due to medical condition  09/30/2015   Post-nasal drip 09/30/2015   Hx of cerebral venous sinus thrombosis 05/18/2015   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   Hx antineoplastic chemotherapy 01/19/2015   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   Herpes zoster 11/07/2014   Angioedema 06/27/2014   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 [place], [address] Phone: [phone]   Fax:  [phone]  Name: Brian Atkins MRN: [id number] Date of Birth: [birth year]

## 2022-02-25 ENCOUNTER — Other Ambulatory Visit: Payer: Self-pay | Admitting: Internal Medicine

## 2022-02-25 ENCOUNTER — Ambulatory Visit: Payer: Medicare Other

## 2022-03-17 ENCOUNTER — Ambulatory Visit: Payer: Medicare Other

## 2022-03-28 ENCOUNTER — Emergency Department: Payer: Medicare Other

## 2022-03-28 ENCOUNTER — Other Ambulatory Visit: Payer: Self-pay

## 2022-03-28 ENCOUNTER — Inpatient Hospital Stay
Admission: EM | Admit: 2022-03-28 | Discharge: 2022-03-30 | DRG: 164 | Disposition: A | Discharge status: Home / Self Care | Payer: Medicare Other | Attending: Internal Medicine | Admitting: Internal Medicine

## 2022-03-28 DIAGNOSIS — Z881 Allergy status to other antibiotic agents status: Secondary | ICD-10-CM

## 2022-03-28 DIAGNOSIS — Z86711 Personal history of pulmonary embolism: Secondary | ICD-10-CM | POA: Diagnosis present

## 2022-03-28 DIAGNOSIS — I2609 Other pulmonary embolism with acute cor pulmonale: Secondary | ICD-10-CM | POA: Diagnosis not present

## 2022-03-28 DIAGNOSIS — Z7951 Long term (current) use of inhaled steroids: Secondary | ICD-10-CM

## 2022-03-28 DIAGNOSIS — Z7982 Long term (current) use of aspirin: Secondary | ICD-10-CM

## 2022-03-28 DIAGNOSIS — R0602 Shortness of breath: Principal | ICD-10-CM

## 2022-03-28 DIAGNOSIS — K219 Gastro-esophageal reflux disease without esophagitis: Secondary | ICD-10-CM | POA: Diagnosis present

## 2022-03-28 DIAGNOSIS — Z8249 Family history of ischemic heart disease and other diseases of the circulatory system: Secondary | ICD-10-CM

## 2022-03-28 DIAGNOSIS — Z20822 Contact with and (suspected) exposure to covid-19: Secondary | ICD-10-CM | POA: Diagnosis present

## 2022-03-28 DIAGNOSIS — Z888 Allergy status to other drugs, medicaments and biological substances status: Secondary | ICD-10-CM

## 2022-03-28 DIAGNOSIS — G4730 Sleep apnea, unspecified: Secondary | ICD-10-CM | POA: Diagnosis present

## 2022-03-28 DIAGNOSIS — I2699 Other pulmonary embolism without acute cor pulmonale: Secondary | ICD-10-CM

## 2022-03-28 DIAGNOSIS — Z8616 Personal history of COVID-19: Secondary | ICD-10-CM

## 2022-03-28 DIAGNOSIS — I5032 Chronic diastolic (congestive) heart failure: Secondary | ICD-10-CM | POA: Diagnosis present

## 2022-03-28 DIAGNOSIS — Z91041 Radiographic dye allergy status: Secondary | ICD-10-CM

## 2022-03-28 DIAGNOSIS — Z885 Allergy status to narcotic agent status: Secondary | ICD-10-CM

## 2022-03-28 DIAGNOSIS — Z91048 Other nonmedicinal substance allergy status: Secondary | ICD-10-CM

## 2022-03-28 DIAGNOSIS — Z91040 Latex allergy status: Secondary | ICD-10-CM

## 2022-03-28 DIAGNOSIS — K449 Diaphragmatic hernia without obstruction or gangrene: Secondary | ICD-10-CM | POA: Diagnosis present

## 2022-03-28 DIAGNOSIS — Z9081 Acquired absence of spleen: Secondary | ICD-10-CM

## 2022-03-28 DIAGNOSIS — M546 Pain in thoracic spine: Secondary | ICD-10-CM | POA: Diagnosis present

## 2022-03-28 DIAGNOSIS — R519 Headache, unspecified: Secondary | ICD-10-CM | POA: Diagnosis not present

## 2022-03-28 DIAGNOSIS — I2510 Atherosclerotic heart disease of native coronary artery without angina pectoris: Secondary | ICD-10-CM | POA: Diagnosis present

## 2022-03-28 DIAGNOSIS — Z79899 Other long term (current) drug therapy: Secondary | ICD-10-CM

## 2022-03-28 DIAGNOSIS — Z8572 Personal history of non-Hodgkin lymphomas: Secondary | ICD-10-CM

## 2022-03-28 DIAGNOSIS — R7989 Other specified abnormal findings of blood chemistry: Secondary | ICD-10-CM

## 2022-03-28 DIAGNOSIS — E785 Hyperlipidemia, unspecified: Secondary | ICD-10-CM | POA: Diagnosis present

## 2022-03-28 DIAGNOSIS — R0902 Hypoxemia: Secondary | ICD-10-CM | POA: Diagnosis present

## 2022-03-28 DIAGNOSIS — I110 Hypertensive heart disease with heart failure: Secondary | ICD-10-CM | POA: Diagnosis present

## 2022-03-28 HISTORY — DX: Other pulmonary embolism without acute cor pulmonale: I26.99

## 2022-03-28 LAB — HEPATIC FUNCTION PANEL
ALT: 13 U/L (ref 0–44)
AST: 16 U/L (ref 15–41)
Albumin: 3.7 g/dL (ref 3.5–5.0)
Alkaline Phosphatase: 76 U/L (ref 38–126)
Bilirubin, Direct: <0.1 mg/dL (ref 0.0–0.2)
Total Bilirubin: 0.8 mg/dL (ref 0.3–1.2)
Total Protein: 7.8 g/dL (ref 6.5–8.1)

## 2022-03-28 LAB — CBC
HCT: 45.8 % (ref 39.0–52.0)
Hemoglobin: 14.9 g/dL (ref 13.0–17.0)
MCH: 29.7 pg (ref 26.0–34.0)
MCHC: 32.5 g/dL (ref 30.0–36.0)
MCV: 91.4 fL (ref 80.0–100.0)
Platelets: 269 10*3/uL (ref 150–400)
RBC: 5.01 MIL/uL (ref 4.22–5.81)
RDW: 13.8 % (ref 11.5–15.5)
WBC: 12.1 10*3/uL — ABNORMAL HIGH (ref 4.0–10.5)
nRBC: 0 % (ref 0.0–0.2)

## 2022-03-28 LAB — BASIC METABOLIC PANEL
Anion gap: 10 (ref 5–15)
BUN: 16 mg/dL (ref 8–23)
CO2: 28 mmol/L (ref 22–32)
Calcium: 9.6 mg/dL (ref 8.9–10.3)
Chloride: 101 mmol/L (ref 98–111)
Creatinine, Ser: 1.1 mg/dL (ref 0.61–1.24)
GFR, Estimated: >60 mL/min (ref >60)
Glucose, Bld: 95 mg/dL (ref 70–99)
Potassium: 4 mmol/L (ref 3.5–5.1)
Sodium: 139 mmol/L (ref 135–145)

## 2022-03-28 LAB — PROTIME-INR
INR: 1 (ref 0.8–1.2)
Prothrombin Time: 13.5 seconds (ref 11.4–15.2)

## 2022-03-28 LAB — URINALYSIS, COMPLETE (UACMP) WITH MICROSCOPIC
Bilirubin Urine: NEGATIVE
Glucose, UA: NEGATIVE mg/dL
Ketones, ur: NEGATIVE mg/dL
Leukocytes,Ua: NEGATIVE
Nitrite: NEGATIVE
Protein, ur: NEGATIVE mg/dL
Specific Gravity, Urine: 1.027 (ref 1.005–1.030)
Squamous Epithelial / HPF: NONE SEEN (ref 0–5)
pH: 5 (ref 5.0–8.0)

## 2022-03-28 LAB — RESP PANEL BY RT-PCR (FLU A&B, COVID) ARPGX2
Influenza A by PCR: NEGATIVE
Influenza B by PCR: NEGATIVE
SARS Coronavirus 2 by RT PCR: NEGATIVE

## 2022-03-28 LAB — TROPONIN I (HIGH SENSITIVITY): Troponin I (High Sensitivity): 5 ng/L (ref <18)

## 2022-03-28 LAB — BRAIN NATRIURETIC PEPTIDE: B Natriuretic Peptide: 17.9 pg/mL (ref 0.0–100.0)

## 2022-03-28 LAB — LIPASE, BLOOD: Lipase: 25 U/L (ref 11–51)

## 2022-03-28 LAB — LACTIC ACID, PLASMA
Lactic Acid, Venous: 0.9 mmol/L (ref 0.5–1.9)
Lactic Acid, Venous: 0.9 mmol/L (ref 0.5–1.9)

## 2022-03-28 LAB — APTT: aPTT: 37 seconds — ABNORMAL HIGH (ref 24–36)

## 2022-03-28 LAB — D-DIMER, QUANTITATIVE: D-Dimer, Quant: 2.15 ug/mL-FEU — ABNORMAL HIGH (ref 0.00–0.50)

## 2022-03-28 MED ORDER — ACETAMINOPHEN 500 MG PO TABS
1000.0000 mg | ORAL_TABLET | Freq: Once | ORAL | Status: AC
  Administered 2022-03-28: 1000 mg via ORAL
  Filled 2022-03-28: qty 2

## 2022-03-28 MED ORDER — DIPHENHYDRAMINE HCL 50 MG/ML IJ SOLN: 50.0000 mg | Freq: Once | INTRAMUSCULAR | Status: AC

## 2022-03-28 MED ORDER — DIPHENHYDRAMINE HCL 25 MG PO CAPS
50.0000 mg | ORAL_CAPSULE | Freq: Once | ORAL | Status: AC
  Administered 2022-03-28: 50 mg via ORAL
  Filled 2022-03-28: qty 2

## 2022-03-28 MED ORDER — IOHEXOL 350 MG/ML SOLN
75.0000 mL | Freq: Once | INTRAVENOUS | Status: AC | PRN
  Administered 2022-03-28: 75 mL via INTRAVENOUS

## 2022-03-28 MED ORDER — HEPARIN (PORCINE) 25000 UT/250ML-% IV SOLN
1450.0000 [IU]/h | INTRAVENOUS | Status: DC
  Administered 2022-03-29 (×2): 1550 [IU]/h via INTRAVENOUS
  Administered 2022-03-30: 1450 [IU]/h via INTRAVENOUS
  Filled 2022-03-28 (×3): qty 250

## 2022-03-28 MED ORDER — HYDROCORTISONE SOD SUC (PF) 250 MG IJ SOLR
200.0000 mg | Freq: Once | INTRAMUSCULAR | Status: AC
  Administered 2022-03-28: 200 mg via INTRAVENOUS
  Filled 2022-03-28: qty 200

## 2022-03-28 MED ORDER — LACTATED RINGERS IV BOLUS
1000.0000 mL | Freq: Once | INTRAVENOUS | Status: AC
  Administered 2022-03-28: 1000 mL via INTRAVENOUS

## 2022-03-28 MED ORDER — HEPARIN BOLUS VIA INFUSION
6000.0000 [IU] | Freq: Once | INTRAVENOUS | Status: AC
Start: 2022-03-28 — End: 2022-03-29
  Administered 2022-03-29: 6000 [IU] via INTRAVENOUS
  Filled 2022-03-28: qty 6000

## 2022-03-28 NOTE — ED Provider Notes (Addendum)
[facility] Provider Note    Event Date/Time   First MD Initiated Contact with Patient 03/28/22 1618     (approximate)   History   Shortness of Breath   HPI  Brian Atkins is a 66 y.o. male with a past medical history of lymphoma, status post splenectomy, CAD with a heart cath in 2018 and recent admission at [facility] approximate 3 weeks ago for SBO presents for evaluation of 3 days of shortness of breath associated slight nonproductive cough and discomfort in the right mid back and lower back.  Patient states he has had some subacute discomfort in the right side of his abdomen since his procedure but no change today or any chest pain, fevers, vomiting, diarrhea, urinary symptoms rash or extremity pain.  States he has been intermittently constipated and has been a couple days since last bowel movement and has been taking milk of magnesia.  He denies any past medical history    Past Medical History:  Diagnosis Date   Complication of anesthesia    slow to wake   Coronary artery disease, non-occlusive    a. LHC 12/18: ostLAD 20%, p/mLAD 60% FFR 0.84, ost ramus 50%, mRCA 50% FFR 0.94, EF 65%   COVID-19 virus infection 12/28/2019   Diverticulitis    Diverticulosis    Dyslipidemia    Dyspnea    Family history of adverse reaction to anesthesia    Mother - slow to wake   GERD (gastroesophageal reflux disease)    Hemorrhoids    Hiatal hernia    History of 2019 novel coronavirus disease (COVID-19) 12/24/2019   History of echocardiogram    a. TTE 1/19: EF of 60-65%, normal wall motion, normal LV diastolic function, mildly dilated LA   History of kidney stones    Labile hypertension    Lymphoma (HCC)    Medial epicondylitis of right elbow    Meralgia paresthetica of right side    Microscopic hematuria    Ocular migraine    Pneumonia    Sleep apnea    CPAP     Physical Exam  Triage Vital Signs: ED Triage Vitals  Enc Vitals Group     BP 03/28/22 1547  113/88     Pulse Rate 03/28/22 1547 76     Resp 03/28/22 1547 16     Temp 03/28/22 1547 98.3 F (36.8 C)     Temp Source 03/28/22 1547 Oral     SpO2 03/28/22 1547 95 %     Weight 03/28/22 1541 215 lb (97.5 kg)     Height 03/28/22 1541 6\' 2"  (1.88 m)     Head Circumference --      Peak Flow --      Pain Score 03/28/22 1540 4     Pain Loc --      Pain Edu? --      Excl. in GC? --     Most recent vital signs: Vitals:   03/28/22 2215 03/28/22 2230  BP: 137/86 129/80  Pulse: 66 63  Resp: 19 16  Temp:    SpO2: 95% 95%    General: Awake, no distress.  CV:  Good peripheral perfusion.  2+ radial pulses. Resp:  Normal effort.  Clear bilaterally. Abd:  No distention.  Some mild tenderness and mass around epigastrium and right upper quadrant.  Abdominal incision appears clean dry and intact. Other:  No significant lower extreme edema   ED Results / Procedures / Treatments  Labs (  all labs ordered are listed, but only abnormal results are displayed) Labs Reviewed  CBC - Abnormal; Notable for the following components:      Result Value   WBC 12.1 (*)    All other components within normal limits  URINALYSIS, COMPLETE (UACMP) WITH MICROSCOPIC - Abnormal; Notable for the following components:   Color, Urine YELLOW (*)    APPearance CLEAR (*)    Hgb urine dipstick MODERATE (*)    Bacteria, UA RARE (*)    All other components within normal limits  APTT - Abnormal; Notable for the following components:   aPTT 37 (*)    All other components within normal limits  D-DIMER, QUANTITATIVE (NOT AT [facility]) - Abnormal; Notable for the following components:   D-Dimer, Quant 2.15 (*)    All other components within normal limits  RESP PANEL BY RT-PCR (FLU A&B, COVID) ARPGX2  CULTURE, BLOOD (ROUTINE X 2)  CULTURE, BLOOD (ROUTINE X 2)  BASIC METABOLIC PANEL  HEPATIC FUNCTION PANEL  LIPASE, BLOOD  BRAIN NATRIURETIC PEPTIDE  LACTIC ACID, PLASMA  LACTIC ACID, PLASMA  PROTIME-INR  TROPONIN I  (HIGH SENSITIVITY)     EKG  ECG is remarkable for sinus rhythm with a ventricular rate of 76, nonspecific T wave change in lead III and ST change in aVF and lead II without other clear evidence of acute ischemia or significant arrhythmia.   RADIOLOGY  Chest x-ray interpreted by myself shows bronchiectasis in the left base without any new focal consolidation, effusion, edema or pneumothorax.  I also reviewed radiologist interpretation.  CT chest abdomen pelvis remarkable for evidence of right middle and lower lobe pulmonary emboli.  There is also some consolidation concerning for possible pneumonia versus atelectasis or infarct.  The abdomen I do not see evidence of ureteral stone other nonobstructing renal stones noted.  As reviewed radiology's interpretation and agree to findings of diverticulosis without diverticulitis and no other clear acute abdominal or pelvic process.   PROCEDURES:  Critical Care performed: No  .1-3 Lead EKG Interpretation Performed by: Gilles Chiquito, MD Authorized by: Gilles Chiquito, MD     Interpretation: normal     ECG rate assessment: normal     Rhythm: sinus rhythm     Ectopy: none     Conduction: normal   .Critical Care Performed by: Gilles Chiquito, MD Authorized by: Gilles Chiquito, MD   Critical care provider statement:    Critical care time (minutes):  30   Critical care was necessary to treat or prevent imminent or life-threatening deterioration of the following conditions:  Circulatory failure   Critical care was time spent personally by me on the following activities:  Development of treatment plan with patient or surrogate, discussions with consultants, evaluation of patient's response to treatment, examination of patient, ordering and review of laboratory studies, ordering and review of radiographic studies, ordering and performing treatments and interventions, pulse oximetry, re-evaluation of patient's condition and review of old  charts  The patient is on the cardiac monitor to evaluate for evidence of arrhythmia and/or significant heart rate changes.\\   MEDICATIONS ORDERED IN ED: Medications  lactated ringers bolus 1,000 mL (0 mLs Intravenous Stopped 03/28/22 1810)  hydrocortisone sodium succinate (SOLU-CORTEF) injection 200 mg (200 mg Intravenous Given 03/28/22 1833)  diphenhydrAMINE (BENADRYL) capsule 50 mg (50 mg Oral Given 03/28/22 2130)    Or  diphenhydrAMINE (BENADRYL) injection 50 mg ( Intravenous See Alternative 03/28/22 2130)  acetaminophen (TYLENOL) tablet 1,000 mg (1,000 mg Oral  Given 03/28/22 1924)  iohexol (OMNIPAQUE) 350 MG/ML injection 75 mL (75 mLs Intravenous Contrast Given 03/28/22 2238)     IMPRESSION / MDM / ASSESSMENT AND PLAN / ED COURSE  I reviewed the triage vital signs and the nursing notes.                              Differential diagnosis includes, but is not limited to ACS, pneumonia, symptomatic pleural effusion, PE, pneumothorax, pancreatitis, kidney stone, anemia, arrhythmia and metabolic derangements.  ECG is remarkable for sinus rhythm with a ventricular rate of 76, nonspecific T wave change in lead III and ST change in aVF and lead II without other clear evidence of acute ischemia or significant arrhythmia.  Chest x-ray interpreted by myself shows bronchiectasis in the left base without any new focal consolidation, effusion, edema or pneumothorax.  I also reviewed radiologist interpretation.  BMP shows no significant lecture metabolic derangements.  CBC shows WBC count of 12.1 without anemia and normal platelets.  Hepatic function panel is unremarkable.  Lipase not suggestive of pancreatitis.  BNP is double digits and overall patient does not appear overloaded.  COVID influenza PCR is negative.  UA has some RBCs and hemoglobin and red bacteria.  Lactic acid is 0.9.  INR is WNL.  PTT is unremarkable.  D-dimer is elevated 2.15.  To assess for possible PE, occult pneumonia kidney stone or  other source of patient's mid right back pain CT chest abdomen pelvis ordered.  Patient has an allergy to this but has tolerated several times before with premedication.  Premedication administered.  CT chest abdomen pelvis remarkable for evidence of right middle and lower lobe pulmonary emboli.  There is also some consolidation concerning for possible pneumonia versus atelectasis or infarct.  The abdomen I do not see evidence of ureteral stone other nonobstructing renal stones noted.  As reviewed radiology's interpretation and agree to findings of diverticulosis without diverticulitis and no other clear acute abdominal or pelvic process.  Radiology also made notation of some evidence of right heart strain.  I notified Dr. Norma Fredrickson of vascular surgery given evidence of right heart strain with plan for patient to be seen tomorrow by vascular team.  Patient started on heparin.  Notified at bedside concern for acute PE as primary etiology for symptoms.  I will admit to medicine service for further evaluation and management.      FINAL CLINICAL IMPRESSION(S) / ED DIAGNOSES   Final diagnoses:  SOB (shortness of breath)  Positive D dimer  Acute right-sided thoracic back pain  S/P splenectomy  Acute pulmonary embolism with acute cor pulmonale, unspecified pulmonary embolism type (HCC)     Rx / DC Orders   ED Discharge Orders     None        Note:  This document was prepared using Dragon voice recognition software and may include unintentional dictation errors.   Gilles Chiquito, MD 03/28/22 5732    Gilles Chiquito, MD 03/28/22 [phone]

## 2022-03-28 NOTE — ED Triage Notes (Addendum)
Pt to ED for SOB since 2 days ago. ?Denies CP, endorses sharp stabbing back pain mid R back, worse with deep breaths. Also has dry hacking cough, intermittent, for 3-4 days. ? ?States has been slightly dizzy for 2-3 days. Denies vision changes but states feels slightly off balance and "clumsy" for same amt time. Pt ambulated to room. ? ?Had surgery for bowel obstruction 3 weeks ago. Denies calf pain. ? ?Hx MI with heart cath in 2018. Hx lymphoma and spleenectomy. ? ?

## 2022-03-28 NOTE — ED Notes (Signed)
Patient transported to CT 

## 2022-03-28 NOTE — Progress Notes (Signed)
ANTICOAGULATION CONSULT NOTE ? ?Pharmacy Consult for heparin consult ?Indication: pulmonary embolus ? ?Allergies  ?Allergen Reactions  ? Avelox [Moxifloxacin] Hives and Swelling  ?  Severe facial swelling  ? Gadolinium Derivatives Swelling  ?  (OK with pre-medication) ?(OK with pre-medication)  ? Contrast Media [Iodinated Contrast Media] Swelling  ?  (OK with pre-medication)  ? Crestor [Rosuvastatin Calcium]   ?  myalgia  ? Morphine And Related Itching  ? Niacin   ?  Other reaction(s): Unknown  ? Niacin-Simvastatin Er   ?  myalgia  ? Latex Rash  ?  Gloves cause raxh  ? Tape Dermatitis  ? Tapentadol Dermatitis  ? Wound Dressing Adhesive Dermatitis  ? ? ?Patient Measurements: ?Height: '6\' 2"'$  (188 cm) ?Weight: 97.5 kg (215 lb) ?IBW/kg (Calculated) : 82.2 ?Heparin Dosing Weight: 97.5 kg ? ?Vital Signs: ?Temp: 98.3 ?F (36.8 ?C) (04/09 1547) ?Temp Source: Oral (04/09 1547) ?BP: 129/80 (04/09 2230) ?Pulse Rate: 63 (04/09 2230) ? ?Labs: ?Recent Labs  ?  03/28/22 ?1548 03/28/22 ?1649  ?HGB 14.9  --   ?HCT 45.8  --   ?PLT 269  --   ?APTT  --  37*  ?LABPROT  --  13.5  ?INR  --  1.0  ?CREATININE 1.10  --   ?TROPONINIHS 5  --   ? ? ?Estimated Creatinine Clearance: 77.8 mL/min (by C-G formula based on SCr of 1.1 mg/dL). ? ? ?Medical History: ?Past Medical History:  ?Diagnosis Date  ? Complication of anesthesia   ? slow to wake  ? Coronary artery disease, non-occlusive   ? a. LHC 12/18: ostLAD 20%, p/mLAD 60% FFR 0.84, ost ramus 50%, mRCA 50% FFR 0.94, EF 65%  ? COVID-19 virus infection 12/28/2019  ? Diverticulitis   ? Diverticulosis   ? Dyslipidemia   ? Dyspnea   ? Family history of adverse reaction to anesthesia   ? Mother - slow to wake  ? GERD (gastroesophageal reflux disease)   ? Hemorrhoids   ? Hiatal hernia   ? History of 2019 novel coronavirus disease (COVID-19) 12/24/2019  ? History of echocardiogram   ? a. TTE 1/19: EF of 60-65%, normal wall motion, normal LV diastolic function, mildly dilated LA  ? History of kidney  stones   ? Labile hypertension   ? Lymphoma ([place])   ? Medial epicondylitis of right elbow   ? Meralgia paresthetica of right side   ? Microscopic hematuria   ? Ocular migraine   ? Pneumonia   ? Sleep apnea   ? CPAP  ? ? ?Assessment: ?Pt is 66 yo male presenting to ED w/ SOB & abdominal & back discomfort, w/ "CT chest abdomen pelvis remarkable for evidence of right middle and lower lobe pulmonary emboli." ? ?Goal of Therapy:  ?Heparin level 0.3-0.7 units/ml ?Monitor platelets by anticoagulation protocol: Yes ?  ?Plan:  ?Bolus 6000 units x 1 ?Start heparin infusion at 1550 units/hr ?Check HL in 6 hr after start of infusion ?CBC daily while on heparin ? ?Renda Rolls, PharmD, MBA ?03/28/2022 ?11:31 PM ? ? ? ?

## 2022-03-29 ENCOUNTER — Encounter
Admission: EM | Disposition: A | Discharge status: Home / Self Care | Payer: Self-pay | Source: Home / Self Care | Attending: Internal Medicine

## 2022-03-29 ENCOUNTER — Inpatient Hospital Stay: Payer: Medicare Other

## 2022-03-29 ENCOUNTER — Inpatient Hospital Stay (HOSPITAL_COMMUNITY)
Admit: 2022-03-29 | Discharge: 2022-03-29 | Disposition: A | Discharge status: Home / Self Care | Payer: Medicare Other | Attending: Internal Medicine | Admitting: Internal Medicine

## 2022-03-29 DIAGNOSIS — Z86711 Personal history of pulmonary embolism: Secondary | ICD-10-CM | POA: Diagnosis present

## 2022-03-29 DIAGNOSIS — R0602 Shortness of breath: Secondary | ICD-10-CM | POA: Diagnosis present

## 2022-03-29 DIAGNOSIS — K219 Gastro-esophageal reflux disease without esophagitis: Secondary | ICD-10-CM | POA: Diagnosis present

## 2022-03-29 DIAGNOSIS — Z79899 Other long term (current) drug therapy: Secondary | ICD-10-CM | POA: Diagnosis not present

## 2022-03-29 DIAGNOSIS — I2609 Other pulmonary embolism with acute cor pulmonale: Secondary | ICD-10-CM

## 2022-03-29 DIAGNOSIS — E785 Hyperlipidemia, unspecified: Secondary | ICD-10-CM | POA: Diagnosis present

## 2022-03-29 DIAGNOSIS — Z8616 Personal history of COVID-19: Secondary | ICD-10-CM | POA: Diagnosis not present

## 2022-03-29 DIAGNOSIS — G4730 Sleep apnea, unspecified: Secondary | ICD-10-CM | POA: Diagnosis present

## 2022-03-29 DIAGNOSIS — Z881 Allergy status to other antibiotic agents status: Secondary | ICD-10-CM | POA: Diagnosis not present

## 2022-03-29 DIAGNOSIS — Z7982 Long term (current) use of aspirin: Secondary | ICD-10-CM | POA: Diagnosis not present

## 2022-03-29 DIAGNOSIS — I2510 Atherosclerotic heart disease of native coronary artery without angina pectoris: Secondary | ICD-10-CM | POA: Diagnosis present

## 2022-03-29 DIAGNOSIS — Z9081 Acquired absence of spleen: Secondary | ICD-10-CM | POA: Diagnosis not present

## 2022-03-29 DIAGNOSIS — R519 Headache, unspecified: Secondary | ICD-10-CM | POA: Diagnosis not present

## 2022-03-29 DIAGNOSIS — I2699 Other pulmonary embolism without acute cor pulmonale: Secondary | ICD-10-CM

## 2022-03-29 DIAGNOSIS — I5032 Chronic diastolic (congestive) heart failure: Secondary | ICD-10-CM | POA: Diagnosis present

## 2022-03-29 DIAGNOSIS — K449 Diaphragmatic hernia without obstruction or gangrene: Secondary | ICD-10-CM | POA: Diagnosis present

## 2022-03-29 DIAGNOSIS — I110 Hypertensive heart disease with heart failure: Secondary | ICD-10-CM | POA: Diagnosis present

## 2022-03-29 DIAGNOSIS — Z7951 Long term (current) use of inhaled steroids: Secondary | ICD-10-CM | POA: Diagnosis not present

## 2022-03-29 DIAGNOSIS — R0902 Hypoxemia: Secondary | ICD-10-CM | POA: Diagnosis present

## 2022-03-29 DIAGNOSIS — Z91048 Other nonmedicinal substance allergy status: Secondary | ICD-10-CM | POA: Diagnosis not present

## 2022-03-29 DIAGNOSIS — Z8249 Family history of ischemic heart disease and other diseases of the circulatory system: Secondary | ICD-10-CM | POA: Diagnosis not present

## 2022-03-29 DIAGNOSIS — M546 Pain in thoracic spine: Secondary | ICD-10-CM | POA: Diagnosis present

## 2022-03-29 DIAGNOSIS — Z888 Allergy status to other drugs, medicaments and biological substances status: Secondary | ICD-10-CM | POA: Diagnosis not present

## 2022-03-29 DIAGNOSIS — Z885 Allergy status to narcotic agent status: Secondary | ICD-10-CM | POA: Diagnosis not present

## 2022-03-29 DIAGNOSIS — Z91040 Latex allergy status: Secondary | ICD-10-CM | POA: Diagnosis not present

## 2022-03-29 DIAGNOSIS — Z91041 Radiographic dye allergy status: Secondary | ICD-10-CM | POA: Diagnosis not present

## 2022-03-29 DIAGNOSIS — Z20822 Contact with and (suspected) exposure to covid-19: Secondary | ICD-10-CM | POA: Diagnosis present

## 2022-03-29 HISTORY — PX: PULMONARY THROMBECTOMY: CATH118295

## 2022-03-29 LAB — COMPREHENSIVE METABOLIC PANEL
ALT: 13 U/L (ref 0–44)
AST: 16 U/L (ref 15–41)
Albumin: 3.3 g/dL — ABNORMAL LOW (ref 3.5–5.0)
Alkaline Phosphatase: 69 U/L (ref 38–126)
Anion gap: 7 (ref 5–15)
BUN: 13 mg/dL (ref 8–23)
CO2: 28 mmol/L (ref 22–32)
Calcium: 9.1 mg/dL (ref 8.9–10.3)
Chloride: 102 mmol/L (ref 98–111)
Creatinine, Ser: 1.02 mg/dL (ref 0.61–1.24)
GFR, Estimated: >60 mL/min (ref >60)
Glucose, Bld: 181 mg/dL — ABNORMAL HIGH (ref 70–99)
Potassium: 3.6 mmol/L (ref 3.5–5.1)
Sodium: 137 mmol/L (ref 135–145)
Total Bilirubin: 0.6 mg/dL (ref 0.3–1.2)
Total Protein: 7.2 g/dL (ref 6.5–8.1)

## 2022-03-29 LAB — PHOSPHORUS: Phosphorus: 3.5 mg/dL (ref 2.5–4.6)

## 2022-03-29 LAB — GLUCOSE, CAPILLARY
Glucose-Capillary: 71 mg/dL (ref 70–99)
Glucose-Capillary: 78 mg/dL (ref 70–99)
Glucose-Capillary: 109 mg/dL — ABNORMAL HIGH (ref 70–99)
Glucose-Capillary: 116 mg/dL — ABNORMAL HIGH (ref 70–99)
Glucose-Capillary: 182 mg/dL — ABNORMAL HIGH (ref 70–99)

## 2022-03-29 LAB — ECHOCARDIOGRAM COMPLETE
AR max vel: 3.21 cm2
AV Area VTI: 3.23 cm2
AV Area mean vel: 3.01 cm2
AV Mean grad: 4 mmHg
AV Peak grad: 7.4 mmHg
Ao pk vel: 1.36 m/s
Area-P 1/2: 4.17 cm2
Height: 74 in
MV VTI: 2.78 cm2
S' Lateral: 2.44 cm
Weight: 3153.46 oz

## 2022-03-29 LAB — MRSA NEXT GEN BY PCR, NASAL: MRSA by PCR Next Gen: NOT DETECTED

## 2022-03-29 LAB — CBC
HCT: 40.5 % (ref 39.0–52.0)
HCT: 43.2 % (ref 39.0–52.0)
Hemoglobin: 13.3 g/dL (ref 13.0–17.0)
Hemoglobin: 14.2 g/dL (ref 13.0–17.0)
MCH: 30.1 pg (ref 26.0–34.0)
MCH: 29.8 pg (ref 26.0–34.0)
MCHC: 32.8 g/dL (ref 30.0–36.0)
MCHC: 32.9 g/dL (ref 30.0–36.0)
MCV: 91.6 fL (ref 80.0–100.0)
MCV: 90.6 fL (ref 80.0–100.0)
Platelets: 240 10*3/uL (ref 150–400)
Platelets: 262 10*3/uL (ref 150–400)
RBC: 4.42 MIL/uL (ref 4.22–5.81)
RBC: 4.77 MIL/uL (ref 4.22–5.81)
RDW: 13.6 % (ref 11.5–15.5)
RDW: 13.5 % (ref 11.5–15.5)
WBC: 10.4 10*3/uL (ref 4.0–10.5)
WBC: 12.2 10*3/uL — ABNORMAL HIGH (ref 4.0–10.5)
nRBC: 0 % (ref 0.0–0.2)
nRBC: 0 % (ref 0.0–0.2)

## 2022-03-29 LAB — HEPARIN LEVEL (UNFRACTIONATED)
Heparin Unfractionated: 0.59 IU/mL (ref 0.30–0.70)
Heparin Unfractionated: 0.22 IU/mL — ABNORMAL LOW (ref 0.30–0.70)
Heparin Unfractionated: 0.5 IU/mL (ref 0.30–0.70)

## 2022-03-29 LAB — CREATININE, SERUM
Creatinine, Ser: 0.96 mg/dL (ref 0.61–1.24)
GFR, Estimated: >60 mL/min (ref >60)

## 2022-03-29 LAB — BUN: BUN: 12 mg/dL (ref 8–23)

## 2022-03-29 LAB — HIV ANTIBODY (ROUTINE TESTING W REFLEX): HIV Screen 4th Generation wRfx: NONREACTIVE

## 2022-03-29 LAB — MAGNESIUM: Magnesium: 2.2 mg/dL (ref 1.7–2.4)

## 2022-03-29 SURGERY — PULMONARY THROMBECTOMY
Anesthesia: Moderate Sedation

## 2022-03-29 MED ORDER — ACETAMINOPHEN 325 MG PO TABS
650.0000 mg | ORAL_TABLET | Freq: Four times a day (QID) | ORAL | Status: DC | PRN
  Administered 2022-03-29: 650 mg via ORAL
  Filled 2022-03-29: qty 2

## 2022-03-29 MED ORDER — MUPIROCIN 2 % EX OINT
1.0000 "application " | TOPICAL_OINTMENT | Freq: Two times a day (BID) | CUTANEOUS | Status: DC
  Filled 2022-03-29: qty 22

## 2022-03-29 MED ORDER — BLISTEX MEDICATED EX OINT
TOPICAL_OINTMENT | CUTANEOUS | Status: DC | PRN
  Filled 2022-03-29: qty 6.3

## 2022-03-29 MED ORDER — MIDAZOLAM HCL 5 MG/5ML IJ SOLN
INTRAMUSCULAR | Status: AC
Start: 2022-03-29 — End: 2022-03-29
  Filled 2022-03-29: qty 5

## 2022-03-29 MED ORDER — DIPHENHYDRAMINE HCL 50 MG/ML IJ SOLN: 50.0000 mg | Freq: Once | INTRAMUSCULAR | Status: DC | PRN

## 2022-03-29 MED ORDER — METHYLPREDNISOLONE SODIUM SUCC 125 MG IJ SOLR: 125.0000 mg | Freq: Once | INTRAMUSCULAR | Status: DC | PRN

## 2022-03-29 MED ORDER — TRIAMCINOLONE ACETONIDE 55 MCG/ACT NA AERO: 2.0000 | INHALATION_SPRAY | Freq: Every day | NASAL | Status: DC

## 2022-03-29 MED ORDER — ZOLPIDEM TARTRATE 5 MG PO TABS
5.0000 mg | ORAL_TABLET | Freq: Every evening | ORAL | Status: DC | PRN
Start: 2022-03-29 — End: 2022-03-30

## 2022-03-29 MED ORDER — METHYLPREDNISOLONE SODIUM SUCC 125 MG IJ SOLR
INTRAMUSCULAR | Status: AC
  Filled 2022-03-29: qty 2

## 2022-03-29 MED ORDER — DEXTROSE 50 % IV SOLN
12.5000 g | INTRAVENOUS | Status: AC
  Administered 2022-03-29: 12.5 g via INTRAVENOUS

## 2022-03-29 MED ORDER — HEPARIN SODIUM (PORCINE) 1000 UNIT/ML IJ SOLN
INTRAMUSCULAR | Status: AC
  Filled 2022-03-29: qty 10

## 2022-03-29 MED ORDER — HYDROMORPHONE HCL 1 MG/ML IJ SOLN: 1.0000 mg | Freq: Once | INTRAMUSCULAR | Status: DC | PRN

## 2022-03-29 MED ORDER — FAMOTIDINE 20 MG PO TABS: 40.0000 mg | ORAL_TABLET | Freq: Once | ORAL | Status: DC | PRN

## 2022-03-29 MED ORDER — PANTOPRAZOLE SODIUM 40 MG PO TBEC
40.0000 mg | DELAYED_RELEASE_TABLET | Freq: Every day | ORAL | Status: DC
  Administered 2022-03-29 – 2022-03-30 (×2): 40 mg via ORAL
  Filled 2022-03-29 (×2): qty 1

## 2022-03-29 MED ORDER — FENTANYL CITRATE (PF) 100 MCG/2ML IJ SOLN
INTRAMUSCULAR | Status: AC
  Filled 2022-03-29: qty 2

## 2022-03-29 MED ORDER — FAMOTIDINE 20 MG PO TABS
20.0000 mg | ORAL_TABLET | Freq: Two times a day (BID) | ORAL | Status: DC
  Administered 2022-03-29 – 2022-03-30 (×3): 20 mg via ORAL
  Filled 2022-03-29 (×3): qty 1

## 2022-03-29 MED ORDER — ALTEPLASE 2 MG IJ SOLR
INTRAMUSCULAR | Status: AC
  Filled 2022-03-29: qty 4

## 2022-03-29 MED ORDER — ONDANSETRON HCL 4 MG/2ML IJ SOLN
4.0000 mg | Freq: Four times a day (QID) | INTRAMUSCULAR | Status: DC | PRN
Start: 2022-03-29 — End: 2022-03-30

## 2022-03-29 MED ORDER — IODIXANOL 320 MG/ML IV SOLN
INTRAVENOUS | Status: DC | PRN
Start: 2022-03-29 — End: 2022-03-29
  Administered 2022-03-29: 50 mL

## 2022-03-29 MED ORDER — SODIUM CHLORIDE 0.9 % IV SOLN
INTRAVENOUS | Status: DC | PRN
  Administered 2022-03-29: 4 mg/h

## 2022-03-29 MED ORDER — MIDAZOLAM HCL 2 MG/2ML IJ SOLN
INTRAMUSCULAR | Status: DC | PRN
  Administered 2022-03-29 (×3): 1 mg via INTRAVENOUS

## 2022-03-29 MED ORDER — CEFAZOLIN SODIUM-DEXTROSE 1-4 GM/50ML-% IV SOLN
INTRAVENOUS | Status: AC
  Filled 2022-03-29: qty 100

## 2022-03-29 MED ORDER — FENTANYL CITRATE (PF) 100 MCG/2ML IJ SOLN
INTRAMUSCULAR | Status: DC | PRN
Start: 2022-03-29 — End: 2022-03-29
  Administered 2022-03-29 (×2): 25 ug via INTRAVENOUS
  Administered 2022-03-29: 50 ug via INTRAVENOUS

## 2022-03-29 MED ORDER — CHLORHEXIDINE GLUCONATE CLOTH 2 % EX PADS
6.0000 | MEDICATED_PAD | Freq: Every day | CUTANEOUS | Status: DC
  Administered 2022-03-29: 6 via TOPICAL

## 2022-03-29 MED ORDER — CARVEDILOL 3.125 MG PO TABS
3.1250 mg | ORAL_TABLET | Freq: Two times a day (BID) | ORAL | Status: DC
  Administered 2022-03-29 (×2): 3.125 mg via ORAL
  Filled 2022-03-29 (×3): qty 1

## 2022-03-29 MED ORDER — ADULT MULTIVITAMIN W/MINERALS CH
1.0000 | ORAL_TABLET | Freq: Every day | ORAL | Status: DC
  Administered 2022-03-30: 1 via ORAL
  Filled 2022-03-29: qty 1

## 2022-03-29 MED ORDER — DIPHENHYDRAMINE HCL 50 MG/ML IJ SOLN
INTRAMUSCULAR | Status: AC
  Filled 2022-03-29: qty 1

## 2022-03-29 MED ORDER — FENTANYL CITRATE PF 50 MCG/ML IJ SOSY
PREFILLED_SYRINGE | INTRAMUSCULAR | Status: AC
  Filled 2022-03-29: qty 2

## 2022-03-29 MED ORDER — DIPHENHYDRAMINE HCL 50 MG/ML IJ SOLN
INTRAMUSCULAR | Status: DC | PRN
  Administered 2022-03-29: 50 mg via INTRAVENOUS

## 2022-03-29 MED ORDER — DEXTROSE 50 % IV SOLN
INTRAVENOUS | Status: AC
  Filled 2022-03-29: qty 50

## 2022-03-29 MED ORDER — ROPINIROLE HCL 0.25 MG PO TABS
0.2500 mg | ORAL_TABLET | Freq: Every day | ORAL | Status: DC
  Administered 2022-03-29: 0.25 mg via ORAL
  Filled 2022-03-29: qty 1

## 2022-03-29 MED ORDER — SODIUM CHLORIDE 0.9 % IV SOLN
INTRAVENOUS | Status: DC
  Administered 2022-03-29: 1000 mL via INTRAVENOUS

## 2022-03-29 MED ORDER — CEFAZOLIN SODIUM-DEXTROSE 2-4 GM/100ML-% IV SOLN
2.0000 g | Freq: Once | INTRAVENOUS | Status: AC
  Administered 2022-03-29: 2 g via INTRAVENOUS

## 2022-03-29 MED ORDER — DEXTROSE IN LACTATED RINGERS 5 % IV SOLN: INTRAVENOUS | Status: DC

## 2022-03-29 MED ORDER — METHYLPREDNISOLONE SODIUM SUCC 125 MG IJ SOLR
INTRAMUSCULAR | Status: DC | PRN
  Administered 2022-03-29: 125 mg via INTRAVENOUS

## 2022-03-29 MED ORDER — MIDAZOLAM HCL 2 MG/ML PO SYRP: 8.0000 mg | ORAL_SOLUTION | Freq: Once | ORAL | Status: DC | PRN

## 2022-03-29 SURGICAL SUPPLY — 15 items
CANISTER PENUMBRA ENGINE (MISCELLANEOUS) ×1 IMPLANT
CATH ANGIO 5F PIGTAIL 100CM (CATHETERS) ×1 IMPLANT
CATH INDIGO 12XTORQ 100 (CATHETERS) ×1 IMPLANT
CATH INDIGO SEP 12 (CATHETERS) ×1 IMPLANT
CATH INFINITI JR4 5F (CATHETERS) ×1 IMPLANT
CATH SELECT BERN TIP 5F 130 (CATHETERS) ×1 IMPLANT
COVER PROBE U/S 5X48 (MISCELLANEOUS) ×1 IMPLANT
DEVICE SAFEGUARD 24CM (GAUZE/BANDAGES/DRESSINGS) ×1 IMPLANT
GLIDEWIRE ADV .035X260CM (WIRE) ×1 IMPLANT
PACK ANGIOGRAPHY (CUSTOM PROCEDURE TRAY) ×2 IMPLANT
SHEATH BRITE TIP 5FRX11 (SHEATH) ×1 IMPLANT
SHEATH PINNACLE 11FRX10 (SHEATH) ×1 IMPLANT
SYR MEDRAD MARK 7 150ML (SYRINGE) ×1 IMPLANT
TUBING CONTRAST HIGH PRESS 72 (TUBING) ×1 IMPLANT
WIRE GUIDERIGHT .035X150 (WIRE) ×1 IMPLANT

## 2022-03-29 NOTE — ED Notes (Signed)
RN to bedside to introduce self to pt. Pt had just got back from walking to the restroom. Pt is in great spirits this morning.  ?

## 2022-03-29 NOTE — ED Notes (Signed)
Report given to Emily

## 2022-03-29 NOTE — Progress Notes (Signed)
ANTICOAGULATION CONSULT NOTE ? ?Pharmacy Consult for heparin consult ?Indication: pulmonary embolus ? ?Allergies  ?Allergen Reactions  ? Avelox [Moxifloxacin] Hives and Swelling  ?  Severe facial swelling  ? Gadolinium Derivatives Swelling  ?  (OK with pre-medication) ?(OK with pre-medication)  ? Contrast Media [Iodinated Contrast Media] Swelling  ?  (OK with pre-medication)  ? Crestor [Rosuvastatin Calcium]   ?  myalgia  ? Morphine And Related Itching  ? Niacin   ?  Other reaction(s): Unknown  ? Niacin-Simvastatin Er   ?  myalgia  ? Latex Rash  ?  Gloves cause raxh  ? Tape Dermatitis  ? Tapentadol Dermatitis  ? Wound Dressing Adhesive Dermatitis  ? ? ?Patient Measurements: ?Height: '6\' 2"'$  (188 cm) ?Weight: 89.4 kg (197 lb 1.5 oz) ?IBW/kg (Calculated) : 82.2 ?Heparin Dosing Weight: 97.5 kg ? ?Vital Signs: ?Temp: 98.1 ?F (36.7 ?C) (04/10 0857) ?Temp Source: Oral (04/10 0857) ?BP: 130/84 (04/10 1112) ?Pulse Rate: 69 (04/10 1112) ? ?Labs: ?Recent Labs  ?  03/28/22 ?1548 03/28/22 ?1649 03/29/22 ?0420 03/29/22 ?0943  ?HGB 14.9  --  13.3 14.2  ?HCT 45.8  --  40.5 43.2  ?PLT 269  --  240 262  ?APTT  --  37*  --   --   ?LABPROT  --  13.5  --   --   ?INR  --  1.0  --   --   ?HEPARINUNFRC  --   --   --  0.59  ?CREATININE 1.10  --  1.02  --   ?TROPONINIHS 5  --   --   --   ? ? ? ?Estimated Creatinine Clearance: 83.9 mL/min (by C-G formula based on SCr of 1.02 mg/dL). ? ? ?Medical History: ?Past Medical History:  ?Diagnosis Date  ? Complication of anesthesia   ? slow to wake  ? Coronary artery disease, non-occlusive   ? a. LHC 12/18: ostLAD 20%, p/mLAD 60% FFR 0.84, ost ramus 50%, mRCA 50% FFR 0.94, EF 65%  ? COVID-19 virus infection 12/28/2019  ? Diverticulitis   ? Diverticulosis   ? Dyslipidemia   ? Dyspnea   ? Family history of adverse reaction to anesthesia   ? Mother - slow to wake  ? GERD (gastroesophageal reflux disease)   ? Hemorrhoids   ? Hiatal hernia   ? History of 2019 novel coronavirus disease (COVID-19) 12/24/2019   ? History of echocardiogram   ? a. TTE 1/19: EF of 60-65%, normal wall motion, normal LV diastolic function, mildly dilated LA  ? History of kidney stones   ? Labile hypertension   ? Lymphoma ([place])   ? Medial epicondylitis of right elbow   ? Meralgia paresthetica of right side   ? Microscopic hematuria   ? Ocular migraine   ? Pneumonia   ? Sleep apnea   ? CPAP  ? ? ?Assessment: ?Pt is 66 yo male presenting to ED w/ SOB & abdominal & back discomfort. 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 Admitted with pulmonary embolism.  ? ?CBC remains stable. Thrombectomy planned for 4/10 at 1430. ? ?4/10 0943 HL 0.59 therapeutic ? ?Goal of Therapy:  ?Heparin level 0.3-0.7 units/ml ?Monitor platelets by anticoagulation protocol: Yes ?  ?Plan: heparin level therapeutic ?Continue heparin infusion at 1550 units/hr ?Check HL in 6 hr after start of infusion ?CBC daily while on  heparin ? ? ?Wynelle Cleveland, PharmD ?Pharmacy Resident  ?03/29/2022 ?12:58 PM ? ?

## 2022-03-29 NOTE — Interval H&P Note (Signed)
History and Physical Interval Note: ? ?03/29/2022 ?1:51 PM ? ?Brian Atkins  has presented today for surgery, with the diagnosis of PE.  The various methods of treatment have been discussed with the patient and family. After consideration of risks, benefits and other options for treatment, the patient has consented to  Procedure(s): ?PULMONARY THROMBECTOMY (N/A) as a surgical intervention.  The patient's history has been reviewed, patient examined, no change in status, stable for surgery.  I have reviewed the patient's chart and labs.  Questions were answered to the patient's satisfaction.   ? ? ?[name] ? ? ?

## 2022-03-29 NOTE — H&P (View-Only) (Signed)
Reason for Consult: Right middle and lower lobe pulmonary embolism with right heart strain ?Referring Physician: Dr. Tamala Julian ? ?Brian Atkins is an 66 y.o. male.  ?HPI: This is a very nice 66 year old gentleman with pertinent past history for exploration within the last 3 weeks or so at [facility] for bowel obstruction.  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  Prophylaxis was used as usual.  He reports a history of over the last 2 to 3 days of increasing shortness of breath, pain on the right side and lower back.  He has been very winded.  He has had no productive cough or bloody sputum. ? ?  He came to the emergency room where work-up including CT scan of the chest and abdomen demonstrated right middle and right lower lobe pulmonary emboli.  He has a RV LV ratio of 1.45.  D-dimer is slightly elevated.  Lactate is normal. ? ?Blood pressure is normal, on room air he satting at 92%.  I am seeing him in this context. ? 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  He notes that after this procedure he also had clot and was on blood thinners for period of time, 3 to 6 months or so.  It does not sound as if he has had a hypercoagulable work-up, would have considered this a provoked DVT regardless. ? ?Past Medical History:  ?Diagnosis Date  ? Complication of anesthesia   ? slow to wake  ? Coronary artery disease, non-occlusive   ? a. LHC 12/18: ostLAD 20%, p/mLAD 60% FFR 0.84, ost ramus 50%, mRCA 50% FFR 0.94, EF 65%  ? COVID-19 virus infection 12/28/2019  ? Diverticulitis   ? Diverticulosis   ? Dyslipidemia   ? Dyspnea   ? Family history of adverse reaction to anesthesia   ? Mother - slow to wake  ? GERD (gastroesophageal reflux disease)   ? Hemorrhoids   ? Hiatal hernia   ? History of 2019 novel coronavirus disease (COVID-19) 12/24/2019  ? History of echocardiogram   ? a. TTE 1/19: EF of 60-65%, normal wall motion, normal LV diastolic function, mildly dilated LA  ? History of kidney stones   ? Labile  hypertension   ? Lymphoma ([place])   ? Medial epicondylitis of right elbow   ? Meralgia paresthetica of right side   ? Microscopic hematuria   ? Ocular migraine   ? Pneumonia   ? Sleep apnea   ? CPAP  ? ? ?Past Surgical History:  ?Procedure Laterality Date  ? BRONCHIAL WASHINGS N/A 08/21/2021  ? Procedure: BRONCHIAL WASHINGS;  Surgeon: Ottie Glazier, MD;  Location: [facility];  Service: Thoracic;  Laterality: N/A;  ? COLONOSCOPY    ? FLEXIBLE BRONCHOSCOPY N/A 08/21/2021  ? Procedure: FLEXIBLE BRONCHOSCOPY;  Surgeon: Ottie Glazier, MD;  Location: [facility];  Service: Thoracic;  Laterality: N/A;  ? INTRAVASCULAR PRESSURE WIRE/FFR STUDY N/A 12/07/2017  ? Procedure: INTRAVASCULAR PRESSURE WIRE/FFR STUDY;  Surgeon: Nelva Bush, MD;  Location: [place] [facility];  Service: Cardiovascular;  Laterality: N/A;  ? LEFT HEART CATH AND CORONARY ANGIOGRAPHY Left 12/07/2017  ? Procedure: LEFT HEART CATH AND CORONARY ANGIOGRAPHY;  Surgeon: Nelva Bush, MD;  Location: [facility] [facility];  Service: Cardiovascular;  Laterality: Left;  ? nissen funduplication  6440  ? [name]  ? SEPTOPLASTY Bilateral 08/23/2019  ? Procedure: SEPTOPLASTY;  Surgeon: [name], MD;  Location: [place];  Service: ENT;  Laterality: Bilateral;  ? SPLENECTOMY    ?  TURBINATE REDUCTION Bilateral 08/23/2019  ? Procedure: TURBINATE REDUCTION;  Surgeon: [name], MD;  Location: [place];  Service: ENT;  Laterality: Bilateral;  Latex sensitivity ?sleep apnea  ? ? ?Family History  ?Problem Relation Age of Onset  ? Coronary artery disease Father 58  ?     CABG  ? Aortic aneurysm Father 98  ?     repaired  ? Hyperlipidemia Father   ? Lung cancer Mother   ? Hypertension Mother   ? Cancer Mother   ?     bladder  ? COPD Mother   ? Stomach cancer Maternal Grandfather   ? ? ?Social History:  reports that he has never smoked. He has been exposed to tobacco smoke. He has never used smokeless tobacco. He reports that he does not  drink alcohol and does not use drugs. ? ?Allergies:  ?Allergies  ?Allergen Reactions  ? Avelox [Moxifloxacin] Hives and Swelling  ?  Severe facial swelling  ? Gadolinium Derivatives Swelling  ?  (OK with pre-medication) ?(OK with pre-medication)  ? Contrast Media [Iodinated Contrast Media] Swelling  ?  (OK with pre-medication)  ? Crestor [Rosuvastatin Calcium]   ?  myalgia  ? Morphine And Related Itching  ? Niacin   ?  Other reaction(s): Unknown  ? Niacin-Simvastatin Er   ?  myalgia  ? Latex Rash  ?  Gloves cause raxh  ? Tape Dermatitis  ? Tapentadol Dermatitis  ? Wound Dressing Adhesive Dermatitis  ? ? ?Medications: I have reviewed the patient's current medications. ?Prior to Admission: (Not in a hospital admission)  ? ?Results for orders placed or performed during the hospital encounter of 03/28/22 (from the past 48 hour(s))  ?Basic metabolic panel     Status: None  ? Collection Time: 03/28/22  3:48 PM  ?Result Value Ref Range  ? Sodium 139 135 - 145 mmol/L  ? Potassium 4.0 3.5 - 5.1 mmol/L  ? Chloride 101 98 - 111 mmol/L  ? CO2 28 22 - 32 mmol/L  ? Glucose, Bld 95 70 - 99 mg/dL  ?  Comment: Glucose reference range applies only to samples taken after fasting for at least 8 hours.  ? BUN 16 8 - 23 mg/dL  ? Creatinine, Ser 1.10 0.61 - 1.24 mg/dL  ? Calcium 9.6 8.9 - 10.3 mg/dL  ? GFR, Estimated >60 >60 mL/min  ?  Comment: (NOTE) ?Calculated using the CKD-EPI Creatinine Equation (2021) ?  ? Anion gap 10 5 - 15  ?  Comment: Performed at [facility], [address] [place], [place] [address]  ?CBC     Status: Abnormal  ? Collection Time: 03/28/22  3:48 PM  ?Result Value Ref Range  ? WBC 12.1 (H) 4.0 - 10.5 K/uL  ? RBC 5.01 4.22 - 5.81 MIL/uL  ? Hemoglobin 14.9 13.0 - 17.0 g/dL  ? HCT 45.8 39.0 - 52.0 %  ? MCV 91.4 80.0 - 100.0 fL  ? MCH 29.7 26.0 - 34.0 pg  ? MCHC 32.5 30.0 - 36.0 g/dL  ? RDW 13.8 11.5 - 15.5 %  ? Platelets 269 150 - 400 K/uL  ? nRBC 0.0 0.0 - 0.2 %  ?  Comment: Performed at [facility], [address], [place], [place] [address]  ?Troponin I (High Sensitivity)     Status: None  ? Collection Time: 03/28/22  3:48 PM  ?Result Value Ref Range  ? Troponin I (High Sensitivity) 5 <18 ng/L  ?  Comment: (  NOTE) ?Elevated high sensitivity troponin I (hsTnI) values and significant  ?changes across serial measurements may suggest ACS but many other  ?chronic and acute conditions are known to elevate hsTnI results.  ?Refer to the "Links" section for chest pain algorithms and additional  ?guidance. ?Performed at [facility], [place], ?[place] [address] ?  ?Hepatic function panel     Status: None  ? Collection Time: 03/28/22  4:00 PM  ?Result Value Ref Range  ? Total Protein 7.8 6.5 - 8.1 g/dL  ? Albumin 3.7 3.5 - 5.0 g/dL  ? AST 16 15 - 41 U/L  ? ALT 13 0 - 44 U/L  ? Alkaline Phosphatase 76 38 - 126 U/L  ? Total Bilirubin 0.8 0.3 - 1.2 mg/dL  ? Bilirubin, Direct <0.1 0.0 - 0.2 mg/dL  ? Indirect Bilirubin NOT CALCULATED 0.3 - 0.9 mg/dL  ?  Comment: Performed at [facility], [address] [place], [place] [address]  ?Lipase, blood     Status: None  ? Collection Time: 03/28/22  4:00 PM  ?Result Value Ref Range  ? Lipase 25 11 - 51 U/L  ?  Comment: Performed at [facility], [address], [place], [place] [address]  ?Brain natriuretic peptide     Status: None  ? Collection Time: 03/28/22  4:00 PM  ?Result Value Ref Range  ? B Natriuretic Peptide 17.9 0.0 - 100.0 pg/mL  ?  Comment: Performed at [facility], [address], [place], [place] [address]  ?Resp Panel by RT-PCR (Flu A&B, Covid) Nasopharyngeal Swab     Status: None  ? Collection Time: 03/28/22  4:48 PM  ? Specimen: Nasopharyngeal Swab; Nasopharyngeal(NP) swabs in vial transport medium  ?Result Value Ref Range  ? SARS Coronavirus 2 by RT PCR NEGATIVE NEGATIVE  ?  Comment: (NOTE) ?SARS-CoV-2 target nucleic acids are NOT DETECTED. ? ?The SARS-CoV-2 RNA is generally detectable in upper  respiratory ?specimens during the acute phase of infection. The lowest ?concentration of SARS-CoV-2 viral copies this assay can detect is ?138 copies/mL. A negative result does not preclude SARS-Cov-2 ?infection and

## 2022-03-29 NOTE — Progress Notes (Signed)
ANTICOAGULATION CONSULT NOTE ? ?Pharmacy Consult for heparin consult ?Indication: pulmonary embolus ? ?Allergies  ?Allergen Reactions  ? Avelox [Moxifloxacin] Hives and Swelling  ?  Severe facial swelling  ? Gadolinium Derivatives Swelling  ?  (OK with pre-medication) ?(OK with pre-medication)  ? Contrast Media [Iodinated Contrast Media] Swelling  ?  (OK with pre-medication)  ? Crestor [Rosuvastatin Calcium]   ?  myalgia  ? Morphine And Related Itching  ? Niacin   ?  Other reaction(s): Unknown  ? Niacin-Simvastatin Er   ?  myalgia  ? Latex Rash  ?  Gloves cause raxh  ? Tape Dermatitis  ? Tapentadol Dermatitis  ? Wound Dressing Adhesive Dermatitis  ? ? ?Patient Measurements: ?Height: '6\' 2"'$  (188 cm) ?Weight: 89.4 kg (197 lb 1.5 oz) ?IBW/kg (Calculated) : 82.2 ?Heparin Dosing Weight: 97.5 kg ? ?Vital Signs: ?Temp: 97.7 ?F (36.5 ?C) (04/10 2000) ?Temp Source: Oral (04/10 2000) ?BP: 120/77 (04/10 2000) ?Pulse Rate: 77 (04/10 2000) ? ?Labs: ?Recent Labs  ?  03/28/22 ?1548 03/28/22 ?1649 03/29/22 ?0420 03/29/22 ?3009 03/29/22 ?1613 03/29/22 ?2144  ?HGB 14.9  --  13.3 14.2  --   --   ?HCT 45.8  --  40.5 43.2  --   --   ?PLT 269  --  240 262  --   --   ?APTT  --  37*  --   --   --   --   ?LABPROT  --  13.5  --   --   --   --   ?INR  --  1.0  --   --   --   --   ?HEPARINUNFRC  --   --   --  0.59 0.22* 0.50  ?CREATININE 1.10  --  1.02  --  0.96  --   ?TROPONINIHS 5  --   --   --   --   --   ? ? ? ?Estimated Creatinine Clearance: 89.2 mL/min (by C-G formula based on SCr of 0.96 mg/dL). ? ? ?Medical History: ?Past Medical History:  ?Diagnosis Date  ? Complication of anesthesia   ? slow to wake  ? Coronary artery disease, non-occlusive   ? a. LHC 12/18: ostLAD 20%, p/mLAD 60% FFR 0.84, ost ramus 50%, mRCA 50% FFR 0.94, EF 65%  ? COVID-19 virus infection 12/28/2019  ? Diverticulitis   ? Diverticulosis   ? Dyslipidemia   ? Dyspnea   ? Family history of adverse reaction to anesthesia   ? Mother - slow to wake  ? GERD  (gastroesophageal reflux disease)   ? Hemorrhoids   ? Hiatal hernia   ? History of 2019 novel coronavirus disease (COVID-19) 12/24/2019  ? History of echocardiogram   ? a. TTE 1/19: EF of 60-65%, normal wall motion, normal LV diastolic function, mildly dilated LA  ? History of kidney stones   ? Labile hypertension   ? Lymphoma ([place])   ? Medial epicondylitis of right elbow   ? Meralgia paresthetica of right side   ? Microscopic hematuria   ? Ocular migraine   ? Pneumonia   ? Sleep apnea   ? CPAP  ? ? ?Assessment: ?Pt is 66 yo male presenting to ED w/ SOB & abdominal & back discomfort. 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 Admitted with pulmonary embolism.  ? ?CBC remains stable.  Thrombectomy planned for 4/10 at 1430. ? ?4/10 0943 HL 0.59 therapeutic ?4/10 2144 HL 0.50 therapeutic X 1  ? ?Goal of Therapy:  ?Heparin level 0.3-0.7 units/ml ?Monitor platelets by anticoagulation protocol: Yes ?  ?Plan:  ?4/10 @ 2144 = 0.50, therapeutic X 1 ?Will continue pt on current rate and recheck HL on 4/11 @ 0400.  ? ?Sylvie Mifsud D, PharmD ?03/29/2022 ?10:32 PM ? ?

## 2022-03-29 NOTE — ED Notes (Signed)
Pt ambulatory to bathroom and back without need for assistance. Pt with steady gait, no distress noted or reported when asked. Pts bed switched out to hospital bed for comfort and pt assisted back in bed with monitor replaced.  ?

## 2022-03-29 NOTE — Op Note (Signed)
[facility] ? Percutaneous Study/Intervention Procedural Note ? ? ?Date of Surgery: 03/29/2022,2:45 PM ? ?Surgeon: [name] ? ?Pre-operative Diagnosis: Symptomatic right sided pulmonary emboli ? ?Post-operative diagnosis:  Same ? ?Procedure(s) Performed: ? 1.  Contrast injection right heart ? 2.  Thrombolysis with 4 mg of tPA to the right pulmonary arteries ? 3.  Mechanical thrombectomy with the penumbra CAT 12 catheter to the right middle and lower lobe pulmonary arteries ? 4.  Selective catheter placement right lower, middle, and upper lobe pulmonary artery ? ?  ? ?Anesthesia: Conscious sedation was administered under my direct supervision by the interventional radiology RN. IV Versed plus fentanyl were utilized. Continuous ECG, pulse oximetry and blood pressure was monitored throughout the entire procedure.  Versed and fentanyl were administered intravenously.  Conscious sedation was administered for a total of 16 minutes using 3 of Versed and 100 mcg of Fentanyl. ? ?EBL: 300 cc ? ?Sheath: 30 French right femoral vein ? ?Contrast: 50 cc  ? ?Fluoroscopy Time: 6.5 minutes ? ?Indications:  Patient presents with pulmonary emboli. The patient is symptomatic with hypoxemia and dyspnea on exertion.  There is evidence of right heart strain on the CT angiogram. The patient is otherwise a good candidate for intervention and even the long-term benefits pulmonary angiography with thrombolysis is offered. The risks and benefits are reviewed long-term benefits are discussed. All questions are answered patient agrees to proceed. ? ?Procedure:  Brian Atkins a 66 y.o. male who was identified and appropriate procedural time out was performed.  The patient was then placed supine on the table and prepped and draped in the usual sterile fashion.  Ultrasound was used to evaluate the right common femoral vein.  It was patent, as it was echolucent and compressible.  A digital ultrasound image was acquired for  the permanent record.  A Seldinger needle was used to access the right common femoral vein under direct ultrasound guidance.  A 0.035 J wire was advanced without resistance and a 5Fr sheath was placed and then upsized to an 11 [place] sheath.   ? ?The wire and pigtail catheter were then negotiated into the right atrium and bolus injection of contrast was utilized to demonstrate the right ventricle and the pulmonary artery outflow. The wire and catheter were then negotiated into the right main pulmonary artery where hand injection of contrast was utilized to demonstrate the pulmonary arteries and confirm the locations of the pulmonary emboli.  Selective catheter placement in the right lower lobe, right middle lobe, and right upper lobe pulmonary arteries was performed with a JR4 catheter with the help of the advantage wire to get selective imaging and identify the thrombus burden.  This was predominantly in the right middle and lower lobes with the lower lobe having the most thrombus burden. ? ? ?TPA was reconstituted and delivered onto the table. A total of 4 milligrams of TPA was utilized, all on the right side.   ? ?The Penumbra Cat 12 catheter was then advanced up into the pulmonary vasculature. The right lung was addressed first. Catheter was negotiated into the right lower lobe pulmonary artery and mechanical thrombectomy was performed using the penumbra CAT 12 catheter and the separator. Follow-up imaging demonstrated a good result and therefore the catheter was renegotiated into the right middle lobe pulmonary artery and again mechanical thrombectomy was performed. Passes were made with both the Penumbra catheter itself as well as introducing the separator. Follow-up imaging was then performed.  Significant improvement  was seen on the right middle lobe pulmonary artery as well. ? ? ?After review these images wires were reintroduced and the catheters removed. Then, the sheath is then pulled and pressures held.  A safeguard is placed. ? ? ? ?Findings:  ? Right heart imaging:  Right atrium and right ventricle and the pulmonary outflow tract appears somewhat dilated ? Right lung: Significant thrombus burden in the right middle and lower lobe pulmonary artery ?  ? ? ? ?Disposition: Patient was taken to the recovery room in stable condition having tolerated the procedure well. ? ?[name] ?03/29/2022,2:45 PM  ?

## 2022-03-29 NOTE — Progress Notes (Signed)
?  Progress Note ? ? ?Patient: Brian Atkins RCB:[id number] DOB: May 17, 1956 DOA: 03/28/2022     0 ?DOS: the patient was seen and examined on 03/29/2022 ?  ?Brief hospital course: ?Per H&P: "ELISE GLADDEN is a 66 y.o. male with medical history significant for recent exploratory laparotomy at outside facility after being admitted for SBO (02/27/22 at 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  Associated with intermittent nonproductive cough, right sided mid back pain, and pleuritic pain worse with taking a deep breath. ..." ? ?Evaluation in the ED revealed Right middle and lower lob pulmonary emboli.  Started on heparin.  Vascular surgery consulted.  Admitted to stepdown unit. ? ?Underwent thrombolysis and thrombectomy with vascular on 4/10.  Stable post-procedure. ? ?Same day as admission rounding note. ?Please see H&P by Dr. Nevada Crane for full Assessment and Plan. ? ?Assessment and Plan: ?See H&P for full A&P which I have reviewed in detail and agree, with any changes or additions below: ? ?--transfer to telemetry floor post-procedure, hemodynamically stable ?--Tylenol for headache this afternoon ? ? ?  ? ?Subjective: Pt seen with wife at bedside, reports overall feeling well.  Mild SOB at rest, worse on exertion.  Reports cough.  Was having chills at home.  No other acute complaints.   ? ?RN reported pt had headache this afternoon.  Requested Tylenol. ? ? ?Physical Exam: ?Vitals:  ? 03/29/22 1730 03/29/22 1800 03/29/22 1830 03/29/22 1900  ?BP: (!) 143/71 134/84 120/72 118/78  ?Pulse: 80 79 83 84  ?Resp: (!) '24 17 18 17  '$ ?Temp:      ?TempSrc:      ?SpO2: 95% 93% 92% 91%  ?Weight:      ?Height:      ? ?General exam: awake, alert, no acute distress ?HEENT:  atraumatic, clear conjunctiva, anicteric sclera, moist mucus membranes, hearing grossly normal  ?Respiratory system: CTAB, no wheezes, rales or rhonchi, normal respiratory effort. ?Cardiovascular system: normal S1/S2,  RRR, no JVD, murmurs, rubs, gallops,  no pedal edema.   ?Gastrointestinal system: soft, NT, ND, no HSM felt, +bowel sounds. ?Central nervous system: A&O x4. no gross focal neurologic deficits, normal speech ?Extremities: moves all , no edema, normal tone ?Skin: dry, intact, normal temperature, normal color, No rashes, lesions or ulcers ?Psychiatry: normal mood, congruent affect, judgement and insight appear normal ? ? ?Data Reviewed: ? ?Notable Labs: WBC 12.2 ? ?Family Communication: wife at bedside on rounds ? ?Disposition: ?Status is: Inpatient ?Remains inpatient appropriate because: remains on heparin, vascular procedure today as outlined above ? ? Planned Discharge Destination: Home ? ? ? ?No charge ? ?Author: ?Ezekiel Slocumb, DO ?03/29/2022 8:26 PM ? ?For on call review [email or web address].  ?

## 2022-03-29 NOTE — H&P (Signed)
?History and Physical ? ?GEROD CALIGIURI ZOX:[id number] DOB: 1956-05-19 DOA: 03/28/2022 ? ?Referring physician: Dr. Creig Hines, [place]  ?PCP: [name], MD  ?Outpatient Specialists:  ?Patient coming from: Home ? ?Chief Complaint: Shortness of breath ? ?HPI: Brian Atkins is a 66 y.o. 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  Associated with intermittent nonproductive cough, right sided mid back pain, and pleuritic pain worse with taking a deep breath.  No hemoptysis or overt bleeding.  No centrally located chest pain.  No lower extremity edema or lower extremity pain.  No recent travel.  No sedentary lifestyle.  He presented to the ED for further evaluation.  Work-up in the ED revealed elevated D-dimer.  He subsequently had a CT angio chest which showed right middle and right lower lobe pulmonary emboli with associated right heart strain.  EDP consulted vascular surgery who recommended heparin drip.  Patient admitted by hospitalist service, [facility]. ? ?ED Course: Temperature 98.3.  BP 125/82, pulse 64, respiratory rate 13, saturation 96% on room air.  Lab studies remarkable for WBC 12.1K. ? ?Review of Systems: ?Review of systems as noted in the HPI. All other systems reviewed and are negative. ? ? ?Past Medical History:  ?Diagnosis Date  ? Complication of anesthesia   ? slow to wake  ? Coronary artery disease, non-occlusive   ? a. LHC 12/18: ostLAD 20%, p/mLAD 60% FFR 0.84, ost ramus 50%, mRCA 50% FFR 0.94, EF 65%  ? COVID-19 virus infection 12/28/2019  ? Diverticulitis   ? Diverticulosis   ? Dyslipidemia   ? Dyspnea    ? Family history of adverse reaction to anesthesia   ? Mother - slow to wake  ? GERD (gastroesophageal reflux disease)   ? Hemorrhoids   ? Hiatal hernia   ? History of 2019 novel coronavirus disease (COVID-19) 12/24/2019  ? History of echocardiogram   ? a. TTE 1/19: EF of 60-65%, normal wall motion, normal LV diastolic function, mildly dilated LA  ? History of kidney stones   ? Labile hypertension   ? Lymphoma ([place])   ? Medial epicondylitis of right elbow   ? Meralgia paresthetica of right side   ? Microscopic hematuria   ? Ocular migraine   ? Pneumonia   ? Sleep apnea   ? CPAP  ? ?Past Surgical History:  ?Procedure Laterality Date  ? BRONCHIAL WASHINGS N/A 08/21/2021  ? Procedure: BRONCHIAL WASHINGS;  Surgeon: Ottie Glazier, MD;  Location: [facility] ORS;  Service: Thoracic;  Laterality: N/A;  ? COLONOSCOPY    ? FLEXIBLE BRONCHOSCOPY N/A 08/21/2021  ? Procedure: FLEXIBLE BRONCHOSCOPY;  Surgeon: Ottie Glazier, MD;  Location: [facility] ORS;  Service: Thoracic;  Laterality: N/A;  ? INTRAVASCULAR PRESSURE WIRE/FFR STUDY N/A 12/07/2017  ? Procedure: INTRAVASCULAR PRESSURE WIRE/FFR STUDY;  Surgeon: Nelva Bush, MD;  Location: [place] [facility];  Service: Cardiovascular;  Laterality: N/A;  ? LEFT HEART CATH AND CORONARY ANGIOGRAPHY Left 12/07/2017  ? Procedure: LEFT HEART CATH AND CORONARY ANGIOGRAPHY;  Surgeon: Nelva Bush, MD;  Location: [place] [facility];  Service: Cardiovascular;  Laterality: Left;  ? nissen funduplication  1914  ? [name]  ?  SEPTOPLASTY Bilateral 08/23/2019  ? Procedure: SEPTOPLASTY;  Surgeon: [name], MD;  Location: [place];  Service: ENT;  Laterality: Bilateral;  ? SPLENECTOMY    ? TURBINATE REDUCTION Bilateral 08/23/2019  ? Procedure: TURBINATE REDUCTION;  Surgeon: [name], MD;  Location: [place];  Service: ENT;  Laterality: Bilateral;  Latex sensitivity ?sleep apnea  ? ? ?Social History:  reports that he has never smoked. He has been exposed to  tobacco smoke. He has never used smokeless tobacco. He reports that he does not drink alcohol and does not use drugs. ? ? ?Allergies  ?Allergen Reactions  ? Avelox [Moxifloxacin] Hives and Swelling  ?  Severe facial swelling  ? Gadolinium Derivatives Swelling  ?  (OK with pre-medication) ?(OK with pre-medication)  ? Contrast Media [Iodinated Contrast Media] Swelling  ?  (OK with pre-medication)  ? Crestor [Rosuvastatin Calcium]   ?  myalgia  ? Morphine And Related Itching  ? Niacin   ?  Other reaction(s): Unknown  ? Niacin-Simvastatin Er   ?  myalgia  ? Latex Rash  ?  Gloves cause raxh  ? Tape Dermatitis  ? Tapentadol Dermatitis  ? Wound Dressing Adhesive Dermatitis  ? ? ?Family History  ?Problem Relation Age of Onset  ? Coronary artery disease Father 22  ?     CABG  ? Aortic aneurysm Father 29  ?     repaired  ? Hyperlipidemia Father   ? Lung cancer Mother   ? Hypertension Mother   ? Cancer Mother   ?     bladder  ? COPD Mother   ? Stomach cancer Maternal Grandfather   ?  ? ? ?Prior to Admission medications   ?Medication Sig Start Date End Date Taking? Authorizing Provider  ?carvedilol (COREG) 12.5 MG tablet Take 1 tablet (12.5 mg total) by mouth 2 (two) times daily. 12/17/20 03/29/22 Yes End, Harrell Gave, MD  ?famotidine (PEPCID) 20 MG tablet Take 20 mg by mouth 2 (two) times daily.   Yes [provider]  ?hydrochlorothiazide (HYDRODIURIL) 25 MG tablet Take 1 tablet (25 mg total) by mouth daily. 10/26/21  Yes [name], MD  ?losartan (COZAAR) 100 MG tablet TAKE ONE TABLET BY MOUTH ONE TIME DAILY 10/20/21  Yes [name], MD  ?Multiple Vitamins-Minerals (MULTIVITAMIN WITH MINERALS) tablet Take 1 tablet by mouth daily.   Yes [provider]  ?omeprazole (PRILOSEC) 40 MG capsule Take 40 mg by mouth daily. 02/17/21  Yes [provider]  ?ondansetron (ZOFRAN) 4 MG tablet Take 1 tablet by mouth every 8 (eight) hours as needed. 03/03/22  Yes [provider]  ?Roma [name] test  strip USE TO CHECK BLOOD SUGAR AS NEEDED 06/18/19  Yes [name], MD  ?rOPINIRole (REQUIP) 0.25 MG tablet Take 0.25 mg by mouth in the morning and at bedtime.  03/26/20  Yes [provider]  ?Syringe/Needle, Disp, (SYRINGE 3CC/25GX1") 25G X 1" 3 ML MISC Use for b12 injections 03/15/18  Yes [name], MD  ?triamcinolone (NASACORT) 55 MCG/ACT AERO nasal inhaler Place 2 sprays into the nose at bedtime.   Yes [provider]  ?zolpidem (AMBIEN CR) 12.5 MG CR tablet TAKE ONE TABLET BY MOUTH AT BEDTIME AS NEEDED FOR SLEEP 02/25/22  Yes [name], MD  ?albuterol (VENTOLIN HFA) 108 (90 Base) MCG/ACT inhaler Inhale 2 puffs into the lungs every 6 (six) hours as needed for wheezing or shortness of breath. ?Patient not taking: Reported on 03/29/2022 03/26/21  [provider]  ?aspirin 81 MG chewable tablet Chew by mouth daily. ?Patient not taking: Reported on 03/29/2022    [provider]  ?butalbital-acetaminophen-caffeine (FIORICET) 50-325-40 MG tablet Take 1 tablet by mouth 2 (two) times daily as needed for headache or migraine. ?Patient not taking: Reported on 03/29/2022 10/26/21   [name], MD  ?cyanocobalamin (,VITAMIN B-12,) 1000 MCG/ML injection Inject 1 mL (1,000 mcg total) into the muscle every 30 (thirty) days. ?Patient not taking: Reported on 03/29/2022 10/13/21   [name], MD  ?diphenhydrAMINE (BENADRYL) 50 MG tablet Take 1 tablet (50 mg total) by mouth once for 1 dose. Take 50 mg by mouth 1 hour prior to scan time 09/15/21 12/28/21  Caroleen Hamman F, MD  ?ezetimibe (ZETIA) 10 MG tablet TAKE 1 TABLET BY MOUTH EVERY DAY ?Patient not taking: Reported on 03/29/2022 05/21/21   Rise Mu, PA-C  ?nitroGLYCERIN (NITROSTAT) 0.4 MG SL tablet Place 1 tablet (0.4 mg total) under the tongue every 5 (five) minutes as needed for chest pain. Maximum of 3 doses. 11/30/17 12/28/21  End, Harrell Gave, MD  ?predniSONE (DELTASONE) 50 MG tablet 1 tablet 13 hours before the procedure, 2nd  dose 5 hours  Before procedure  and 3rd dose 1 hour before procedure. ?Patient not taking: Reported on 03/29/2022 10/27/21   [name], MD  ?prochlorperazine (COMPAZINE) 5 MG tablet Take 5 mg by mouth eve

## 2022-03-29 NOTE — Hospital Course (Addendum)
Per H&P: "Brian Atkins is a 66 y.o. 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  Associated with intermittent nonproductive cough, right sided mid back pain, and pleuritic pain worse with taking a deep breath. ..." ? ?Evaluation in the ED revealed Right middle and lower lob pulmonary emboli.  Started on heparin.  Vascular surgery consulted.  Admitted to stepdown unit. ? ?Underwent thrombolysis and thrombectomy with vascular on 4/10.  Stable post-procedure. ? ?Patient demonstrated clinical improvement more rapidly than was anticipated on admission.  He is medically stable to discharge home today on Eliquis. ?

## 2022-03-29 NOTE — Consult Note (Signed)
Reason for Consult: Right middle and lower lobe pulmonary embolism with right heart strain ?Referring Physician: Dr. Tamala Julian ? ?Brian Atkins is an 66 y.o. male.  ?HPI: This is a very nice 66 year old gentleman with pertinent past history for exploration within the last 3 weeks or so at [facility] for bowel obstruction.  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  Prophylaxis was used as usual.  He reports a history of over the last 2 to 3 days of increasing shortness of breath, pain on the right side and lower back.  He has been very winded.  He has had no productive cough or bloody sputum. ? ?  He came to the emergency room where work-up including CT scan of the chest and abdomen demonstrated right middle and right lower lobe pulmonary emboli.  He has a RV LV ratio of 1.45.  D-dimer is slightly elevated.  Lactate is normal. ? ?Blood pressure is normal, on room air he satting at 92%.  I am seeing him in this context. ? 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  He notes that after this procedure he also had clot and was on blood thinners for period of time, 3 to 6 months or so.  It does not sound as if he has had a hypercoagulable work-up, would have considered this a provoked DVT regardless. ? ?Past Medical History:  ?Diagnosis Date  ? Complication of anesthesia   ? slow to wake  ? Coronary artery disease, non-occlusive   ? a. LHC 12/18: ostLAD 20%, p/mLAD 60% FFR 0.84, ost ramus 50%, mRCA 50% FFR 0.94, EF 65%  ? COVID-19 virus infection 12/28/2019  ? Diverticulitis   ? Diverticulosis   ? Dyslipidemia   ? Dyspnea   ? Family history of adverse reaction to anesthesia   ? Mother - slow to wake  ? GERD (gastroesophageal reflux disease)   ? Hemorrhoids   ? Hiatal hernia   ? History of 2019 novel coronavirus disease (COVID-19) 12/24/2019  ? History of echocardiogram   ? a. TTE 1/19: EF of 60-65%, normal wall motion, normal LV diastolic function, mildly dilated LA  ? History of kidney stones   ? Labile  hypertension   ? Lymphoma ([place])   ? Medial epicondylitis of right elbow   ? Meralgia paresthetica of right side   ? Microscopic hematuria   ? Ocular migraine   ? Pneumonia   ? Sleep apnea   ? CPAP  ? ? ?Past Surgical History:  ?Procedure Laterality Date  ? BRONCHIAL WASHINGS N/A 08/21/2021  ? Procedure: BRONCHIAL WASHINGS;  Surgeon: Ottie Glazier, MD;  Location: [facility];  Service: Thoracic;  Laterality: N/A;  ? COLONOSCOPY    ? FLEXIBLE BRONCHOSCOPY N/A 08/21/2021  ? Procedure: FLEXIBLE BRONCHOSCOPY;  Surgeon: Ottie Glazier, MD;  Location: [facility];  Service: Thoracic;  Laterality: N/A;  ? INTRAVASCULAR PRESSURE WIRE/FFR STUDY N/A 12/07/2017  ? Procedure: INTRAVASCULAR PRESSURE WIRE/FFR STUDY;  Surgeon: Nelva Bush, MD;  Location: [place] [facility];  Service: Cardiovascular;  Laterality: N/A;  ? LEFT HEART CATH AND CORONARY ANGIOGRAPHY Left 12/07/2017  ? Procedure: LEFT HEART CATH AND CORONARY ANGIOGRAPHY;  Surgeon: Nelva Bush, MD;  Location: [place] [facility];  Service: Cardiovascular;  Laterality: Left;  ? nissen funduplication  [id number]  ? [name]  ? SEPTOPLASTY Bilateral 08/23/2019  ? Procedure: SEPTOPLASTY;  Surgeon: [name], MD;  Location: [place];  Service: ENT;  Laterality: Bilateral;  ? SPLENECTOMY    ?  TURBINATE REDUCTION Bilateral 08/23/2019  ? Procedure: TURBINATE REDUCTION;  Surgeon: [name], MD;  Location: [place];  Service: ENT;  Laterality: Bilateral;  Latex sensitivity ?sleep apnea  ? ? ?Family History  ?Problem Relation Age of Onset  ? Coronary artery disease Father 45  ?     CABG  ? Aortic aneurysm Father 37  ?     repaired  ? Hyperlipidemia Father   ? Lung cancer Mother   ? Hypertension Mother   ? Cancer Mother   ?     bladder  ? COPD Mother   ? Stomach cancer Maternal Grandfather   ? ? ?Social History:  reports that he has never smoked. He has been exposed to tobacco smoke. He has never used smokeless tobacco. He reports that he does not  drink alcohol and does not use drugs. ? ?Allergies:  ?Allergies  ?Allergen Reactions  ? Avelox [Moxifloxacin] Hives and Swelling  ?  Severe facial swelling  ? Gadolinium Derivatives Swelling  ?  (OK with pre-medication) ?(OK with pre-medication)  ? Contrast Media [Iodinated Contrast Media] Swelling  ?  (OK with pre-medication)  ? Crestor [Rosuvastatin Calcium]   ?  myalgia  ? Morphine And Related Itching  ? Niacin   ?  Other reaction(s): Unknown  ? Niacin-Simvastatin Er   ?  myalgia  ? Latex Rash  ?  Gloves cause raxh  ? Tape Dermatitis  ? Tapentadol Dermatitis  ? Wound Dressing Adhesive Dermatitis  ? ? ?Medications: I have reviewed the patient's current medications. ?Prior to Admission: (Not in a hospital admission)  ? ?Results for orders placed or performed during the hospital encounter of 03/28/22 (from the past 48 hour(s))  ?Basic metabolic panel     Status: None  ? Collection Time: 03/28/22  3:48 PM  ?Result Value Ref Range  ? Sodium 139 135 - 145 mmol/L  ? Potassium 4.0 3.5 - 5.1 mmol/L  ? Chloride 101 98 - 111 mmol/L  ? CO2 28 22 - 32 mmol/L  ? Glucose, Bld 95 70 - 99 mg/dL  ?  Comment: Glucose reference range applies only to samples taken after fasting for at least 8 hours.  ? BUN 16 8 - 23 mg/dL  ? Creatinine, Ser 1.10 0.61 - 1.24 mg/dL  ? Calcium 9.6 8.9 - 10.3 mg/dL  ? GFR, Estimated >60 >60 mL/min  ?  Comment: (NOTE) ?Calculated using the CKD-EPI Creatinine Equation (2021) ?  ? Anion gap 10 5 - 15  ?  Comment: Performed at [facility], [address], [place], [place] [address]  ?CBC     Status: Abnormal  ? Collection Time: 03/28/22  3:48 PM  ?Result Value Ref Range  ? WBC 12.1 (H) 4.0 - 10.5 K/uL  ? RBC 5.01 4.22 - 5.81 MIL/uL  ? Hemoglobin 14.9 13.0 - 17.0 g/dL  ? HCT 45.8 39.0 - 52.0 %  ? MCV 91.4 80.0 - 100.0 fL  ? MCH 29.7 26.0 - 34.0 pg  ? MCHC 32.5 30.0 - 36.0 g/dL  ? RDW 13.8 11.5 - 15.5 %  ? Platelets 269 150 - 400 K/uL  ? nRBC 0.0 0.0 - 0.2 %  ?  Comment: Performed at [facility], [address], [place], [place] [address]  ?Troponin I (High Sensitivity)     Status: None  ? Collection Time: 03/28/22  3:48 PM  ?Result Value Ref Range  ? Troponin I (High Sensitivity) 5 <18 ng/L  ?  Comment: (  NOTE) ?Elevated high sensitivity troponin I (hsTnI) values and significant  ?changes across serial measurements may suggest ACS but many other  ?chronic and acute conditions are known to elevate hsTnI results.  ?Refer to the "Links" section for chest pain algorithms and additional  ?guidance. ?Performed at [facility], [place], ?[place] [address] ?  ?Hepatic function panel     Status: None  ? Collection Time: 03/28/22  4:00 PM  ?Result Value Ref Range  ? Total Protein 7.8 6.5 - 8.1 g/dL  ? Albumin 3.7 3.5 - 5.0 g/dL  ? AST 16 15 - 41 U/L  ? ALT 13 0 - 44 U/L  ? Alkaline Phosphatase 76 38 - 126 U/L  ? Total Bilirubin 0.8 0.3 - 1.2 mg/dL  ? Bilirubin, Direct <0.1 0.0 - 0.2 mg/dL  ? Indirect Bilirubin NOT CALCULATED 0.3 - 0.9 mg/dL  ?  Comment: Performed at [facility], [address] [place], [place] [address]  ?Lipase, blood     Status: None  ? Collection Time: 03/28/22  4:00 PM  ?Result Value Ref Range  ? Lipase 25 11 - 51 U/L  ?  Comment: Performed at [facility], [address], [place], [place] [address]  ?Brain natriuretic peptide     Status: None  ? Collection Time: 03/28/22  4:00 PM  ?Result Value Ref Range  ? B Natriuretic Peptide 17.9 0.0 - 100.0 pg/mL  ?  Comment: Performed at [facility], [address], [place],  [address]  ?Resp Panel by RT-PCR (Flu A&B, Covid) Nasopharyngeal Swab     Status: None  ? Collection Time: 03/28/22  4:48 PM  ? Specimen: Nasopharyngeal Swab; Nasopharyngeal(NP) swabs in vial transport medium  ?Result Value Ref Range  ? SARS Coronavirus 2 by RT PCR NEGATIVE NEGATIVE  ?  Comment: (NOTE) ?SARS-CoV-2 target nucleic acids are NOT DETECTED. ? ?The SARS-CoV-2 RNA is generally detectable in upper  respiratory ?specimens during the acute phase of infection. The lowest ?concentration of SARS-CoV-2 viral copies this assay can detect is ?138 copies/mL. A negative result does not preclude SARS-Cov-2 ?infection and

## 2022-03-29 NOTE — Progress Notes (Signed)
Pt A&OX4. Symmetrical movement in all extremities. Tolerating diet. Vascular site level zero, no hematoma. Pedal pulses and radial pulses intact.  ?

## 2022-03-30 ENCOUNTER — Other Ambulatory Visit (HOSPITAL_COMMUNITY): Payer: Self-pay

## 2022-03-30 ENCOUNTER — Encounter: Payer: Self-pay | Admitting: Vascular Surgery

## 2022-03-30 DIAGNOSIS — I2699 Other pulmonary embolism without acute cor pulmonale: Secondary | ICD-10-CM | POA: Diagnosis not present

## 2022-03-30 LAB — BASIC METABOLIC PANEL
Anion gap: 9 (ref 5–15)
BUN: 15 mg/dL (ref 8–23)
CO2: 25 mmol/L (ref 22–32)
Calcium: 9 mg/dL (ref 8.9–10.3)
Chloride: 105 mmol/L (ref 98–111)
Creatinine, Ser: 0.89 mg/dL (ref 0.61–1.24)
GFR, Estimated: >60 mL/min (ref >60)
Glucose, Bld: 163 mg/dL — ABNORMAL HIGH (ref 70–99)
Potassium: 4 mmol/L (ref 3.5–5.1)
Sodium: 139 mmol/L (ref 135–145)

## 2022-03-30 LAB — CBC
HCT: 41.4 % (ref 39.0–52.0)
Hemoglobin: 13.5 g/dL (ref 13.0–17.0)
MCH: 29.9 pg (ref 26.0–34.0)
MCHC: 32.6 g/dL (ref 30.0–36.0)
MCV: 91.6 fL (ref 80.0–100.0)
Platelets: 252 10*3/uL (ref 150–400)
RBC: 4.52 MIL/uL (ref 4.22–5.81)
RDW: 13.4 % (ref 11.5–15.5)
WBC: 12.3 10*3/uL — ABNORMAL HIGH (ref 4.0–10.5)
nRBC: 0 % (ref 0.0–0.2)

## 2022-03-30 LAB — MAGNESIUM: Magnesium: 2.3 mg/dL (ref 1.7–2.4)

## 2022-03-30 LAB — GLUCOSE, CAPILLARY
Glucose-Capillary: 145 mg/dL — ABNORMAL HIGH (ref 70–99)
Glucose-Capillary: 111 mg/dL — ABNORMAL HIGH (ref 70–99)

## 2022-03-30 LAB — HEPARIN LEVEL (UNFRACTIONATED): Heparin Unfractionated: 0.72 IU/mL — ABNORMAL HIGH (ref 0.30–0.70)

## 2022-03-30 MED ORDER — APIXABAN 5 MG PO TABS: 5.0000 mg | ORAL_TABLET | Freq: Two times a day (BID) | ORAL | Status: DC

## 2022-03-30 MED ORDER — APIXABAN (ELIQUIS) VTE STARTER PACK (10MG AND 5MG): ORAL_TABLET | ORAL | 0 refills | Status: DC

## 2022-03-30 MED ORDER — APIXABAN 5 MG PO TABS
10.0000 mg | ORAL_TABLET | Freq: Two times a day (BID) | ORAL | Status: DC
  Administered 2022-03-30: 10 mg via ORAL
  Filled 2022-03-30: qty 2

## 2022-03-30 MED ORDER — ENSURE ENLIVE PO LIQD
237.0000 mL | Freq: Three times a day (TID) | ORAL | Status: DC
  Administered 2022-03-30: 237 mL via ORAL

## 2022-03-30 MED ORDER — CARVEDILOL 3.125 MG PO TABS: 3.1250 mg | ORAL_TABLET | Freq: Two times a day (BID) | ORAL | 0 refills | Status: DC

## 2022-03-30 MED ORDER — DOCUSATE SODIUM 100 MG PO CAPS
100.0000 mg | ORAL_CAPSULE | Freq: Two times a day (BID) | ORAL | Status: DC
  Administered 2022-03-30: 100 mg via ORAL
  Filled 2022-03-30: qty 1

## 2022-03-30 NOTE — Progress Notes (Signed)
ANTICOAGULATION CONSULT NOTE ? ?Pharmacy Consult for heparin consult ?Indication: pulmonary embolus ? ?Allergies  ?Allergen Reactions  ? Avelox [Moxifloxacin] Hives and Swelling  ?  Severe facial swelling  ? Gadolinium Derivatives Swelling  ?  (OK with pre-medication) ?(OK with pre-medication)  ? Contrast Media [Iodinated Contrast Media] Swelling  ?  (OK with pre-medication)  ? Crestor [Rosuvastatin Calcium]   ?  myalgia  ? Morphine And Related Itching  ? Niacin   ?  Other reaction(s): Unknown  ? Niacin-Simvastatin Er   ?  myalgia  ? Latex Rash  ?  Gloves cause raxh  ? Tape Dermatitis  ? Tapentadol Dermatitis  ? Wound Dressing Adhesive Dermatitis  ? ? ?Patient Measurements: ?Height: '6\' 2"'$  (188 cm) ?Weight: 89.4 kg (197 lb 1.5 oz) ?IBW/kg (Calculated) : 82.2 ?Heparin Dosing Weight: 97.5 kg ? ?Vital Signs: ?Temp: 97.7 ?F (36.5 ?C) (04/10 2000) ?Temp Source: Oral (04/10 2000) ?BP: 120/77 (04/10 2000) ?Pulse Rate: 77 (04/10 2000) ? ?Labs: ?Recent Labs  ?  03/28/22 ?1548 03/28/22 ?1649 03/29/22 ?0420 03/29/22 ?0420 03/29/22 ?8127 03/29/22 ?1613 03/29/22 ?2144 03/30/22 ?0533  ?HGB 14.9  --  13.3  --  14.2  --   --  13.5  ?HCT 45.8  --  40.5  --  43.2  --   --  41.4  ?PLT 269  --  240  --  262  --   --  252  ?APTT  --  37*  --   --   --   --   --   --   ?LABPROT  --  13.5  --   --   --   --   --   --   ?INR  --  1.0  --   --   --   --   --   --   ?HEPARINUNFRC  --   --   --    < > 0.59 0.22* 0.50 0.72*  ?CREATININE 1.10  --  1.02  --   --  0.96  --  0.89  ?TROPONINIHS 5  --   --   --   --   --   --   --   ? < > = values in this interval not displayed.  ? ? ? ?Estimated Creatinine Clearance: 96.2 mL/min (by C-G formula based on SCr of 0.89 mg/dL). ? ? ?Medical History: ?Past Medical History:  ?Diagnosis Date  ? Complication of anesthesia   ? slow to wake  ? Coronary artery disease, non-occlusive   ? a. LHC 12/18: ostLAD 20%, p/mLAD 60% FFR 0.84, ost ramus 50%, mRCA 50% FFR 0.94, EF 65%  ? COVID-19 virus infection 12/28/2019   ? Diverticulitis   ? Diverticulosis   ? Dyslipidemia   ? Dyspnea   ? Family history of adverse reaction to anesthesia   ? Mother - slow to wake  ? GERD (gastroesophageal reflux disease)   ? Hemorrhoids   ? Hiatal hernia   ? History of 2019 novel coronavirus disease (COVID-19) 12/24/2019  ? History of echocardiogram   ? a. TTE 1/19: EF of 60-65%, normal wall motion, normal LV diastolic function, mildly dilated LA  ? History of kidney stones   ? Labile hypertension   ? Lymphoma ([place])   ? Medial epicondylitis of right elbow   ? Meralgia paresthetica of right side   ? Microscopic hematuria   ? Ocular migraine   ? Pneumonia   ? Sleep apnea   ? CPAP  ? ? ?  Assessment: ?Pt is 66 yo male presenting to ED w/ SOB & abdominal & back discomfort. 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 Admitted with pulmonary embolism.  ? ?CBC remains stable. Thrombectomy planned for 4/10 at 1430. ? ?4/10 0943 HL 0.59 therapeutic ?4/10 2144 HL 0.50 therapeutic X 1  ?4/11 0533 HL 0.72 elevated  ? ?Goal of Therapy:  ?Heparin level 0.3-0.7 units/ml ?Monitor platelets by anticoagulation protocol: Yes ?  ?Plan:  ?4/11 0533 HL = 0.72 elevated  ?Will decrease heparin drip rate to 1450 units/hr and recheck HL 6 hrs after rate change.  ? ?Danta Baumgardner D, PharmD ?03/30/2022 ?6:34 AM ? ?

## 2022-03-30 NOTE — Progress Notes (Signed)
[facility] ? ?Daily Progress Note ? ? ?Subjective  -  ? ?Doing well.  Ambulating.  Only drops his sats with sleeping as he has severe sleep apnea.  Breathing comfortably without shortness of breath. ? ?Objective ?Vitals:  ? 03/30/22 0900 03/30/22 1200 03/30/22 1300 03/30/22 1400  ?BP: 135/82 125/74 125/84 (!) 142/85  ?Pulse: 65 68 61 81  ?Resp: '17 20 18 19  '$ ?Temp:      ?TempSrc:      ?SpO2: 95% 96% 97% 98%  ?Weight:      ?Height:      ? ? ?Intake/Output Summary (Last 24 hours) at 03/30/2022 1503 ?Last data filed at 03/30/2022 0746 ?Gross per 24 hour  ?Intake 540.41 ml  ?Output --  ?Net 540.41 ml  ? ? ?PULM  CTAB ?CV  RRR ?VASC  Access site is clean, dry, and intact ? ?Laboratory ?CBC ?   ?Component Value Date/Time  ? WBC 12.3 (H) 03/30/2022 0533  ? HGB 13.5 03/30/2022 0533  ? HGB 15.2 11/30/2017 1455  ? HCT 41.4 03/30/2022 0533  ? HCT 44.5 11/30/2017 1455  ? PLT 252 03/30/2022 0533  ? PLT 306 11/30/2017 1455  ? ? ?BMET ?   ?Component Value Date/Time  ? NA 139 03/30/2022 0533  ? NA 142 08/08/2020 1546  ? NA 143 03/28/2014 0625  ? K 4.0 03/30/2022 0533  ? K 3.9 03/28/2014 0625  ? CL 105 03/30/2022 0533  ? CL 110 (H) 03/28/2014 6195  ? CO2 25 03/30/2022 0533  ? CO2 30 03/28/2014 0625  ? GLUCOSE 163 (H) 03/30/2022 0533  ? GLUCOSE 93 03/28/2014 0625  ? BUN 15 03/30/2022 0533  ? BUN 24 08/08/2020 1546  ? BUN 12 03/28/2014 0625  ? CREATININE 0.89 03/30/2022 0533  ? CREATININE 1.06 11/07/2020 1524  ? CALCIUM 9.0 03/30/2022 0533  ? CALCIUM 8.5 03/28/2014 0625  ? GFRNONAA >60 03/30/2022 0533  ? GFRNONAA >60 03/28/2014 0625  ? GFRAA >60 09/12/2020 1557  ? GFRAA >60 03/28/2014 0625  ? ? ?Assessment/Planning: ?POD #1 s/p PE thrombectomy ? ?Doing well ?Ambulatory ?Switched over to oral anticoagulation ?Okay to discharge from my point of view with follow-up in our office in about 1 month with our nurse practitioner Eulogio Ditch ? ? ? ?[name] ? ?03/30/2022, 3:03 PM ? ? ? ?  ?

## 2022-03-30 NOTE — TOC Benefit Eligibility Note (Signed)
Patient Advocate Encounter ? ?Insurance verification completed.   ? ?The patient is currently admitted and upon discharge could be taking Eliquis 5 mg. ? ?The current 30 day co-pay is, $577.10 due to a $505.00 deductible.  ? ?The patient is insured through [employer] Medicare Part D  ? ? ? ?Lyndel Safe, CPhT ?Pharmacy Patient Advocate Specialist ?[place] Patient Advocate Team ?Direct Number: [phone]  Fax: [phone] ? ? ? ? ? ?  ?

## 2022-03-30 NOTE — Discharge Summary (Signed)
?Physician Discharge Summary ?  ?Patient: Brian Atkins MRN: [id number] DOB: 1956/08/07  ?Admit date:     03/28/2022  ?Discharge date: 03/30/22  ?Discharge Physician: Ezekiel Slocumb  ? ?PCP: [name], MD  ? ?Recommendations at discharge:  ? ? Follow up with PCP in 1-2 weeks ?Repeat BMP, CBC in 1-2 weeks ?Follow up on use of Eliquis for PE ? ? ?Discharge Diagnoses: ?Principal Problem: ?  Acute pulmonary embolism ([place]) ? ? ?Hospital Course: ?Per H&P: "Brian Atkins is a 66 y.o. 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  Associated with intermittent nonproductive cough, right sided mid back pain, and pleuritic pain worse with taking a deep breath. ..." ? ?Evaluation in the ED revealed Right middle and lower lob pulmonary emboli.  Started on heparin.  Vascular surgery consulted.  Admitted to stepdown unit. ? ?Underwent thrombolysis and thrombectomy with vascular on 4/10.  Stable post-procedure. ? ?Patient demonstrated clinical improvement more rapidly than was anticipated on admission.  He is medically stable to discharge home today on Eliquis. ? ?Assessment and Plan: ?Right middle and right lower lobe acute pulmonary embolism with right heart strain seen on CT scan ?Recent abdominal surgery at outside facility on 02/27/2022. ?Prior history of splenic vein thrombus/left portal vein thrombus for which he completed 12 months course of full dose subcu Lovenox in 2016. ?Presented with elevated D-dimer 2.15, subsequently had a CT angio chest which revealed right middle and right lower lobe pulmonary emboli with  associated right heart strain.  ?BNP (17) and troponin (5) negative. ?Seen by vascular surgery, recommended heparin drip ?Taken from Thrombectomy / thrombolysis. ? ?Transitioned to Eliquis. ? ?ECHO --  1. Left ventricular ejection fraction, by estimation, is 60 to 65%. The left ventricle has normal function. The left ventricle has no regional wall motion abnormalities. Left ventricular diastolic parameters are consistent with Grade II diastolic  ?dysfunction (pseudonormalization).  ? 2. Right ventricular systolic function is normal. The right ventricular size is mildly enlarged. Tricuspid regurgitation signal is inadequate for assessing PA pressure.  ? 3. The mitral valve is normal in structure. No evidence of mitral valve regurgitation. No evidence of mitral stenosis.  ? 4. The aortic valve is tricuspid. There is mild thickening of the aortic valve. Aortic valve regurgitation is not visualized. No aortic stenosis is present.  ? ?Lower Extremity Doppler U/S --  ?IMPRESSION: ?No evidence of deep venous thrombosis in either lower extremity. ?  ?  ?Marginal zone B-cell lymphoma in remission ?In remission for more than 5 years ?Follows with medical oncology outpatient at [facility] ?  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 ?  ?GERD/small hiatal hernia associated with nisin fundoplication ?Resume home PPI ?  ?Cervical spondylosis Not an acute issue ?  ?Hyperlipidemia Resume home statin ?  ?Essential hypertension Resume home medication ? ? ?  ? ? ?Consultants: Vascular surgery ?Procedures performed: Thrombolysis/Thrombectomy, Echo, Doppler U/S BLE ?Disposition: Home ?Diet recommendation:  ?Cardiac diet ?DISCHARGE MEDICATION: ?Allergies as of 03/30/2022   ? ?   Reactions  ? Avelox [moxifloxacin] Hives, Swelling  ?  Severe facial swelling  ? Gadolinium Derivatives Swelling  ? (OK with pre-medication) ?(OK with  pre-medication)  ? Contrast Media [iodinated Contrast Media] Swelling  ? (OK with pre-medication)  ? Crestor [rosuvastatin Calcium]   ? myalgia  ? Morphine And Related Itching  ? Niacin   ? Other reaction(s): Unknown  ? Niacin-simvastatin Er   ? myalgia  ? Latex Rash  ? Gloves cause raxh  ? Tape Dermatitis  ? Tapentadol Dermatitis  ? Wound Dressing Adhesive Dermatitis  ? ?  ? ?  ?Medication List  ?  ? ?STOP taking these medications   ? ?albuterol 108 (90 Base) MCG/ACT inhaler ?Commonly known as: VENTOLIN HFA ?  ?aspirin 81 MG chewable tablet ?  ?butalbital-acetaminophen-caffeine 50-325-40 MG tablet ?Commonly known as: FIORICET ?  ?cyanocobalamin 1000 MCG/ML injection ?Commonly known as: (VITAMIN B-12) ?  ?ezetimibe 10 MG tablet ?Commonly known as: ZETIA ?  ?predniSONE 50 MG tablet ?Commonly known as: DELTASONE ?  ?prochlorperazine 5 MG tablet ?Commonly known as: COMPAZINE ?  ?rosuvastatin 20 MG tablet ?Commonly known as: CRESTOR ?  ?Trelegy Ellipta 100-62.5-25 MCG/ACT Aepb ?Generic drug: Fluticasone-Umeclidin-Vilant ?  ? ?  ? ?TAKE these medications   ? ?Apixaban Starter Pack ('10mg'$  and '5mg'$ ) ?Commonly known as: ELIQUIS STARTER PACK ?Take as directed on package: start with two-'5mg'$  tablets twice daily for 7 days. On day 8, switch to one-'5mg'$  tablet twice daily. ?  ?carvedilol 3.125 MG tablet ?Commonly known as: COREG ?Take 1 tablet (3.125 mg total) by mouth 2 (two) times daily. ?What changed:  ?medication strength ?how much to take ?  ?diphenhydrAMINE 50 MG tablet ?Commonly known as: BENADRYL ?Take 1 tablet (50 mg total) by mouth once for 1 dose. Take 50 mg by mouth 1 hour prior to scan time ?  ?famotidine 20 MG tablet ?Commonly known as: PEPCID ?Take 20 mg by mouth 2 (two) times daily. ?  ?hydrochlorothiazide 25 MG tablet ?Commonly known as: HYDRODIURIL ?Take 1 tablet (25 mg total) by mouth daily. ?  ?losartan 100 MG tablet ?Commonly known as: COZAAR ?TAKE ONE TABLET BY MOUTH ONE TIME DAILY ?  ?Motegrity 2 MG  Tabs ?Generic drug: Prucalopride Succinate ?Take 2 mg by mouth daily as needed (constipation). ?  ?multivitamin with minerals tablet ?Take 1 tablet by mouth daily. ?  ?nitroGLYCERIN 0.4 MG SL tablet ?Commonly known as: NITROSTAT ?Place 1 tablet (0.4 mg total) under the tongue every 5 (five) minutes as needed for chest pain. Maximum of 3 doses. ?  ?omeprazole 40 MG capsule ?Commonly known as: PRILOSEC ?Take 40 mg by mouth daily. ?  ?ondansetron 4 MG tablet ?Commonly known as: ZOFRAN ?Take 1 tablet by mouth every 8 (eight) hours as needed. ?  ?OneTouch Verio test strip ?Generic drug: glucose blood ?USE TO CHECK BLOOD SUGAR AS NEEDED ?  ?rOPINIRole 0.25 MG tablet ?Commonly known as: REQUIP ?Take 0.25 mg by mouth in the morning and at bedtime. ?  ?SYRINGE 3CC/25GX1" 25G X 1" 3 ML Misc ?Use for b12 injections ?  ?triamcinolone 55 MCG/ACT Aero nasal inhaler ?Commonly known as: NASACORT ?Place 2 sprays into the nose at bedtime. ?  ?zolpidem 12.5 MG CR tablet ?Commonly known as: AMBIEN CR ?TAKE ONE TABLET BY MOUTH AT BEDTIME AS NEEDED FOR SLEEP ?  ? ?  ? ? Follow-up Information   ? ? Kris Hartmann, NP Follow up in 4 week(s).   ?Specialty: Vascular Surgery ?Contact information: ?[address] ?[place] [address] ?[phone] ? ? ?  ?  ? ?  ?  ? ?  ? ?  Discharge Exam: ?Filed Weights  ? 03/28/22 1541 03/29/22 0857  ?Weight: 97.5 kg 89.4 kg  ? ?General exam: awake, alert, no acute distress ?HEENT: atraumatic, clear conjunctiva, anicteric sclera, moist mucus membranes, hearing grossly normal  ?Respiratory system: CTAB, no wheezes, rales or rhonchi, normal respiratory effort. ?Cardiovascular system: normal S1/S2, RRR, no JVD, murmurs, rubs, gallops, no pedal edema.   ?Gastrointestinal system: soft, NT, ND, no HSM felt, +bowel sounds. ?Central nervous system: A&O x4. no gross focal neurologic deficits, normal speech ?Extremities: moves all, no edema, normal tone ?Skin: dry, intact, normal temperature, normal color, No rashes,  lesions or ulcers ?Psychiatry: normal mood, congruent affect, judgement and insight appear normal ? ? ?Condition at discharge: stable ? ?The results of significant diagnostics from this hospitalization (including imaging,

## 2022-03-30 NOTE — Progress Notes (Signed)
Pt discharged from facility, all lines removed, belongings taken with him (glasses, clothes) and all questions answered. Walked pt out with his wife present vitals WNL. ?

## 2022-03-30 NOTE — Progress Notes (Signed)
Initial Nutrition Assessment ? ?DOCUMENTATION CODES:  ? ?Not applicable ? ?INTERVENTION:  ? ?Ensure Enlive po TID, each supplement provides 350 kcal and 20 grams of protein. ? ?MVI po daily  ? ?NUTRITION DIAGNOSIS:  ? ?Inadequate oral intake related to acute illness as evidenced by per patient/family report. ? ?GOAL:  ? ?Patient will meet greater than or equal to 90% of their needs ? ?MONITOR:  ? ?PO intake, Supplement acceptance, Labs, Weight trends, Skin, I & O's ? ?REASON FOR ASSESSMENT:  ? ?Malnutrition Screening Tool ?  ? ?ASSESSMENT:  ? 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 ? ?Met with pt in room today. Pt reports decreased appetite and oral intake for the past month after having SBO in March. Pt reports that he did not eat for 5 days prior to having his bowel obstruction. Pt reports that he has been eating about 50% of meals for the past month. Pt has been drinking chocolate protein shakes at home and taking daily MVI. Per chart, pt is down 19lbs(9%) since January; this is significant weight loss. Pt with h/o Nissen Fundoplication in 3428. Pt reports that his GERD is improved after the surgery but states that he believes he has another hiatal hernia as he has been feeling some pressure in his throat after eating. Pt reports eating an omelet and bacon for breakfast this morning. RD will add supplements and MVI to help pt meet his estimated needs.  ? ?Medications reviewed and include: colace, pepcid, protonix ? ?Labs reviewed: K 4.0 wnl, P 3.5 wnl, Mg 2.3 wnl ?Wbc- 12.3(H) ?Cbgs- 111, 145 x 24 hrs ? ?NUTRITION - FOCUSED PHYSICAL EXAM: ? ?Flowsheet Row Most Recent Value  ?Orbital Region No  depletion  ?Upper Arm Region Mild depletion  ?Thoracic and Lumbar Region No depletion  ?Buccal Region No depletion  ?Temple Region No depletion  ?Clavicle Bone Region No depletion  ?Clavicle and Acromion Bone Region No depletion  ?Scapular Bone Region No depletion  ?Dorsal Hand No depletion  ?Patellar Region Mild depletion  ?Anterior Thigh Region Mild depletion  ?Posterior Calf Region Mild depletion  ?Edema (RD Assessment) None  ?Hair Reviewed  ?Eyes Reviewed  ?Mouth Reviewed  ?Skin Reviewed  ?Nails Reviewed  ? ?Diet Order:   ?Diet Order   ? ?       ?  Diet regular Room service appropriate? Yes; Fluid consistency: Thin  Diet effective now       ?  ? ?  ?  ? ?  ? ?EDUCATION NEEDS:  ? ?Education needs have been addressed ? ?Skin:  Skin Assessment: Reviewed RN Assessment (ecchymosis) ? ?Last BM:  4/11 ? ?Height:  ? ?Ht Readings from Last 1 Encounters:  ?03/29/22 '6\' 2"'  (1.88 m)  ? ? ?Weight:  ? ?Wt Readings from Last 1 Encounters:  ?03/29/22 89.4 kg  ? ? ?Ideal Body Weight:  86.3 kg ? ?BMI:  Body mass index is 25.31 kg/m?. ? ?Estimated Nutritional Needs:  ? ?Kcal:  2200-2500kcal/day ? ?Protein:  110-125g/day ? ?Fluid:  2.2-2.5L/day ? ?Koleen Distance MS, RD, LDN ?Please refer to [facility] for RD and/or RD on-call/weekend/after hours pager ? ?

## 2022-04-02 LAB — CULTURE, BLOOD (ROUTINE X 2): Culture: NO GROWTH

## 2022-04-03 LAB — CULTURE, BLOOD (ROUTINE X 2)
Culture: NO GROWTH
Special Requests: ADEQUATE

## 2022-04-07 ENCOUNTER — Ambulatory Visit (INDEPENDENT_AMBULATORY_CARE_PROVIDER_SITE_OTHER): Payer: Medicare Other

## 2022-04-07 ENCOUNTER — Ambulatory Visit (INDEPENDENT_AMBULATORY_CARE_PROVIDER_SITE_OTHER): Payer: Medicare Other | Admitting: Internal Medicine

## 2022-04-07 ENCOUNTER — Encounter: Payer: Self-pay | Admitting: Internal Medicine

## 2022-04-07 VITALS — BP 118/74 | HR 85 | Temp 98.2°F | Ht 74.0 in | Wt 204.6 lb

## 2022-04-07 DIAGNOSIS — D72829 Elevated white blood cell count, unspecified: Secondary | ICD-10-CM | POA: Diagnosis not present

## 2022-04-07 DIAGNOSIS — Z125 Encounter for screening for malignant neoplasm of prostate: Secondary | ICD-10-CM | POA: Diagnosis not present

## 2022-04-07 DIAGNOSIS — R7303 Prediabetes: Secondary | ICD-10-CM | POA: Diagnosis not present

## 2022-04-07 DIAGNOSIS — L0231 Cutaneous abscess of buttock: Secondary | ICD-10-CM

## 2022-04-07 DIAGNOSIS — E559 Vitamin D deficiency, unspecified: Secondary | ICD-10-CM | POA: Diagnosis not present

## 2022-04-07 DIAGNOSIS — J3489 Other specified disorders of nose and nasal sinuses: Secondary | ICD-10-CM

## 2022-04-07 DIAGNOSIS — R519 Headache, unspecified: Secondary | ICD-10-CM

## 2022-04-07 DIAGNOSIS — R972 Elevated prostate specific antigen [PSA]: Secondary | ICD-10-CM

## 2022-04-07 DIAGNOSIS — M25512 Pain in left shoulder: Secondary | ICD-10-CM

## 2022-04-07 DIAGNOSIS — E785 Hyperlipidemia, unspecified: Secondary | ICD-10-CM | POA: Diagnosis not present

## 2022-04-07 DIAGNOSIS — M19012 Primary osteoarthritis, left shoulder: Secondary | ICD-10-CM | POA: Diagnosis not present

## 2022-04-07 DIAGNOSIS — M5030 Other cervical disc degeneration, unspecified cervical region: Secondary | ICD-10-CM

## 2022-04-07 DIAGNOSIS — N400 Enlarged prostate without lower urinary tract symptoms: Secondary | ICD-10-CM

## 2022-04-07 DIAGNOSIS — M542 Cervicalgia: Secondary | ICD-10-CM

## 2022-04-07 DIAGNOSIS — D75839 Thrombocytosis, unspecified: Secondary | ICD-10-CM

## 2022-04-07 DIAGNOSIS — Z1329 Encounter for screening for other suspected endocrine disorder: Secondary | ICD-10-CM

## 2022-04-07 LAB — LIPID PANEL
Cholesterol: 253 mg/dL — ABNORMAL HIGH (ref 0–200)
HDL: 33 mg/dL — ABNORMAL LOW (ref >39.00)
NonHDL: 220.09
Total CHOL/HDL Ratio: 8
Triglycerides: 225 mg/dL — ABNORMAL HIGH (ref 0.0–149.0)
VLDL: 45 mg/dL — ABNORMAL HIGH (ref 0.0–40.0)

## 2022-04-07 LAB — HEMOGLOBIN A1C: Hgb A1c MFr Bld: 6 % (ref 4.6–6.5)

## 2022-04-07 LAB — CBC WITH DIFFERENTIAL/PLATELET
Basophils Absolute: 0.1 10*3/uL (ref 0.0–0.1)
Basophils Relative: 1.5 % (ref 0.0–3.0)
Eosinophils Absolute: 0.3 10*3/uL (ref 0.0–0.7)
Eosinophils Relative: 3.4 % (ref 0.0–5.0)
HCT: 41.2 % (ref 39.0–52.0)
Hemoglobin: 13.8 g/dL (ref 13.0–17.0)
Lymphocytes Relative: 25.1 % (ref 12.0–46.0)
Lymphs Abs: 2.3 10*3/uL (ref 0.7–4.0)
MCHC: 33.5 g/dL (ref 30.0–36.0)
MCV: 91.7 fl (ref 78.0–100.0)
Monocytes Absolute: 1.4 10*3/uL — ABNORMAL HIGH (ref 0.1–1.0)
Monocytes Relative: 14.5 % — ABNORMAL HIGH (ref 3.0–12.0)
Neutro Abs: 5.2 10*3/uL (ref 1.4–7.7)
Neutrophils Relative %: 55.5 % (ref 43.0–77.0)
Platelets: 596 10*3/uL — ABNORMAL HIGH (ref 150.0–400.0)
RBC: 4.49 Mil/uL (ref 4.22–5.81)
RDW: 14 % (ref 11.5–15.5)
WBC: 9.3 10*3/uL (ref 4.0–10.5)

## 2022-04-07 LAB — PSA: PSA: 4.76 ng/mL — ABNORMAL HIGH (ref 0.10–4.00)

## 2022-04-07 LAB — TSH: TSH: 1.46 u[IU]/mL (ref 0.35–5.50)

## 2022-04-07 LAB — VITAMIN D 25 HYDROXY (VIT D DEFICIENCY, FRACTURES): VITD: 30.64 ng/mL (ref 30.00–100.00)

## 2022-04-07 LAB — LDL CHOLESTEROL, DIRECT: Direct LDL: 188 mg/dL

## 2022-04-07 MED ORDER — MUPIROCIN 2 % EX OINT: 1.0000 "application " | TOPICAL_OINTMENT | Freq: Two times a day (BID) | CUTANEOUS | 0 refills | Status: DC

## 2022-04-07 MED ORDER — TIZANIDINE HCL 4 MG PO TABS: 2.0000 mg | ORAL_TABLET | Freq: Every evening | ORAL | 2 refills | Status: DC | PRN

## 2022-04-07 MED ORDER — DOXYCYCLINE HYCLATE 100 MG PO TABS: 100.0000 mg | ORAL_TABLET | Freq: Two times a day (BID) | ORAL | 0 refills | Status: DC

## 2022-04-07 NOTE — Progress Notes (Signed)
Chief Complaint  ?Patient presents with  ? Left arm and shoulder pain  ?  Loss of ROM, pain with numbness/tingling in hands & fingers.   ? knot above right thigh  ? ?F/u  ?1. C/o left shoulder pain and ROM issues numbness/tingling in hands and fingers worse on the left  ?Xray today  ? ?FINDINGS: ?There is no evidence of cervical spine fracture or prevertebral soft ?tissue swelling. Alignment is normal. Mild-to-moderate degenerative ?disc disease is seen at levels of C5-6 and C6-7, without change. No ?significant facet arthropathy or other bone abnormality identified. ?  ?IMPRESSION: ?No acute findings. ?  ?Stable mild to moderate C5-6 and C6-7 degenerative disc disease. ?  ?  ?Electronically Signed ?  By: Marlaine Hind M.D. ?  On: 04/07/2022 09:44 ? ?FINDINGS: ?There is no evidence of fracture or dislocation. Early degenerative ?spurring is seen involving the acromioclavicular joint. No other ?focal bone abnormality. Soft tissues are unremarkable. ?  ?IMPRESSION: ?No acute findings. Early acromioclavicular degenerative spurring. ?  ?  ?Electronically Signed ?  By: Marlaine Hind M.D. ?  On: 04/07/2022 09:42 ? ?2. C/o buttock cyst new nothing tried ?3. Recently hospitalized for PE after having bowel surgery and his wbc ct was elevated will recheck labs today ? ? ?Review of Systems  ?Constitutional:  Negative for weight loss.  ?HENT:  Negative for hearing loss.   ?Eyes:  Negative for blurred vision.  ?Respiratory:  Negative for shortness of breath.   ?Cardiovascular:  Negative for chest pain.  ?Gastrointestinal:  Negative for blood in stool.  ?Genitourinary:  Negative for dysuria.  ?Musculoskeletal:  Positive for joint pain. Negative for back pain and falls.  ?Skin:  Negative for rash.  ?Neurological:  Negative for headaches.  ?Psychiatric/Behavioral:  Negative for depression.   ?Past Medical History:  ?Diagnosis Date  ? Complication of anesthesia   ? slow to wake  ? Coronary artery disease, non-occlusive   ? a. LHC  12/18: ostLAD 20%, p/mLAD 60% FFR 0.84, ost ramus 50%, mRCA 50% FFR 0.94, EF 65%  ? COVID-19 virus infection 12/28/2019  ? Diverticulitis   ? Diverticulosis   ? Dyslipidemia   ? Dyspnea   ? Family history of adverse reaction to anesthesia   ? Mother - slow to wake  ? GERD (gastroesophageal reflux disease)   ? Hemorrhoids   ? Hiatal hernia   ? History of 2019 novel coronavirus disease (COVID-19) 12/24/2019  ? History of echocardiogram   ? a. TTE 1/19: EF of 60-65%, normal wall motion, normal LV diastolic function, mildly dilated LA  ? History of kidney stones   ? Labile hypertension   ? Lymphoma ([place])   ? Medial epicondylitis of right elbow   ? Meralgia paresthetica of right side   ? Microscopic hematuria   ? Ocular migraine   ? Pneumonia   ? Sleep apnea   ? CPAP  ? ?Past Surgical History:  ?Procedure Laterality Date  ? BRONCHIAL WASHINGS N/A 08/21/2021  ? Procedure: BRONCHIAL WASHINGS;  Surgeon: Ottie Glazier, MD;  Location: [facility];  Service: Thoracic;  Laterality: N/A;  ? COLONOSCOPY    ? FLEXIBLE BRONCHOSCOPY N/A 08/21/2021  ? Procedure: FLEXIBLE BRONCHOSCOPY;  Surgeon: Ottie Glazier, MD;  Location: [facility];  Service: Thoracic;  Laterality: N/A;  ? INTRAVASCULAR PRESSURE WIRE/FFR STUDY N/A 12/07/2017  ? Procedure: INTRAVASCULAR PRESSURE WIRE/FFR STUDY;  Surgeon: Nelva Bush, MD;  Location: [place] [facility];  Service: Cardiovascular;  Laterality: N/A;  ? LEFT HEART CATH  AND CORONARY ANGIOGRAPHY Left 12/07/2017  ? Procedure: LEFT HEART CATH AND CORONARY ANGIOGRAPHY;  Surgeon: Nelva Bush, MD;  Location: [place] [facility];  Service: Cardiovascular;  Laterality: Left;  ? nissen funduplication  8527  ? [name]  ? PULMONARY THROMBECTOMY N/A 03/29/2022  ? Procedure: PULMONARY THROMBECTOMY;  Surgeon: Algernon Huxley, MD;  Location: [place] [facility];  Service: Cardiovascular;  Laterality: N/A;  ? SEPTOPLASTY Bilateral 08/23/2019  ? Procedure: SEPTOPLASTY;  Surgeon: [name], MD;  Location:  [place];  Service: ENT;  Laterality: Bilateral;  ? SPLENECTOMY    ? TURBINATE REDUCTION Bilateral 08/23/2019  ? Procedure: TURBINATE REDUCTION;  Surgeon: [name], MD;  Location: [place];  Service: ENT;  Laterality: Bilateral;  Latex sensitivity ?sleep apnea  ? ?Family History  ?Problem Relation Age of Onset  ? Coronary artery disease Father 39  ?     CABG  ? Aortic aneurysm Father 15  ?     repaired  ? Hyperlipidemia Father   ? Lung cancer Mother   ? Hypertension Mother   ? Cancer Mother   ?     bladder  ? COPD Mother   ? Stomach cancer Maternal Grandfather   ? ?Social History  ? ?Socioeconomic History  ? Marital status: Married  ?  Spouse name: Not on file  ? Number of children: 2  ? Years of education: 80  ? Highest education level: Not on file  ?Occupational History  ? Occupation: Shop Printmaker  ?  Employer: [employer]  ?  Comment: [place]  ?Tobacco Use  ? Smoking status: Never  ?  Passive exposure: Past  ? Smokeless tobacco: Never  ?Vaping Use  ? Vaping Use: Never used  ?Substance and Sexual Activity  ? Alcohol use: No  ? Drug use: No  ? Sexual activity: Yes  ?Other Topics Concern  ? Not on file  ?Social History Narrative  ? Not on file  ? ?Social Determinants of Health  ? ?Financial Resource Strain: Not on file  ?Food Insecurity: Not on file  ?Transportation Needs: Not on file  ?Physical Activity: Not on file  ?Stress: Not on file  ?Social Connections: Not on file  ?Intimate Partner Violence: Not on file  ? ?Current Meds  ?Medication Sig  ? APIXABAN (ELIQUIS) VTE STARTER PACK (10MG AND 5MG) Take as directed on package: start with two-24m tablets twice daily for 7 days. On day 8, switch to one-576mtablet twice daily.  ? carvedilol (COREG) 3.125 MG tablet Take 1 tablet (3.125 mg total) by mouth 2 (two) times daily.  ? diphenhydrAMINE (BENADRYL) 50 MG tablet Take 1 tablet (50 mg total) by mouth once for 1 dose. Take 50 mg by mouth 1 hour prior to scan time  ? doxycycline  (VIBRA-TABS) 100 MG tablet Take 1 tablet (100 mg total) by mouth 2 (two) times daily. With food x 7-10 days  ? famotidine (PEPCID) 20 MG tablet Take 20 mg by mouth 2 (two) times daily.  ? hydrochlorothiazide (HYDRODIURIL) 25 MG tablet Take 1 tablet (25 mg total) by mouth daily.  ? losartan (COZAAR) 100 MG tablet TAKE ONE TABLET BY MOUTH ONE TIME DAILY  ? Multiple Vitamins-Minerals (MULTIVITAMIN WITH MINERALS) tablet Take 1 tablet by mouth daily.  ? mupirocin ointment (BACTROBAN) 2 % Apply 1 application. topically 2 (two) times daily. In nose x 5 days  ? omeprazole (PRILOSEC) 40 MG capsule Take 40 mg by mouth daily.  ? ondansetron (ZOFRAN) 4  MG tablet Take 1 tablet by mouth every 8 (eight) hours as needed.  ? ONETOUCH VERIO test strip USE TO CHECK BLOOD SUGAR AS NEEDED  ? Prucalopride Succinate (MOTEGRITY) 2 MG TABS Take 2 mg by mouth daily as needed (constipation).  ? rOPINIRole (REQUIP) 0.25 MG tablet Take 0.25 mg by mouth in the morning and at bedtime.   ? Syringe/Needle, Disp, (SYRINGE 3CC/25GX1") 25G X 1" 3 ML MISC Use for b12 injections  ? tiZANidine (ZANAFLEX) 4 MG tablet Take 0.5-1 tablets (2-4 mg total) by mouth at bedtime as needed for muscle spasms.  ? triamcinolone (NASACORT) 55 MCG/ACT AERO nasal inhaler Place 2 sprays into the nose at bedtime.  ? zolpidem (AMBIEN CR) 12.5 MG CR tablet TAKE ONE TABLET BY MOUTH AT BEDTIME AS NEEDED FOR SLEEP  ? ?Allergies  ?Allergen Reactions  ? Avelox [Moxifloxacin] Hives and Swelling  ?  Severe facial swelling  ? Gadolinium Derivatives Swelling  ?  (OK with pre-medication) ?(OK with pre-medication)  ? Contrast Media [Iodinated Contrast Media] Swelling  ?  (OK with pre-medication)  ? Crestor [Rosuvastatin Calcium]   ?  myalgia  ? Morphine And Related Itching  ? Niacin   ?  Other reaction(s): Unknown  ? Niacin-Simvastatin Er   ?  myalgia  ? Latex Rash  ?  Gloves cause raxh  ? Tape Dermatitis  ? Tapentadol Dermatitis  ? Wound Dressing Adhesive Dermatitis  ? ?Recent Results  (from the past 2160 hour(s))  ?Basic metabolic panel     Status: None  ? Collection Time: 03/28/22  3:48 PM  ?Result Value Ref Range  ? Sodium 139 135 - 145 mmol/L  ? Potassium 4.0 3.5 - 5.1 mmol/L  ? Chloride 101

## 2022-04-07 NOTE — Patient Instructions (Addendum)
Tylenol 3000 to 4000 mg daily total  ?Aspercream with lidocaine to left shoulder  ? ? ?If not better cyst or irritated follicle  ?Dove antibacterial body wash  ?Warm compress daily as much as possible  ?If comes to a head use alcohol wipe clean area and prick with sterile needle  ?Use bactroban to nose 2x per day x 5 days thin layer with qtip ?Doxycycline 2x per day x 7-10 days with food ? ? ?Shoulder Exercises ?Ask your health care provider which exercises are safe for you. Do exercises exactly as told by your health care provider and adjust them as directed. It is normal to feel mild stretching, pulling, tightness, or discomfort as you do these exercises. Stop right away if you feel sudden pain or your pain gets worse. Do not begin these exercises until told by your health care provider. ?Stretching exercises ?External rotation and abduction ?This exercise is sometimes called corner stretch. This exercise rotates your arm outward (external rotation) and moves your arm out from your body (abduction). ?Stand in a doorway with one of your feet slightly in front of the other. This is called a staggered stance. If you cannot reach your forearms to the door frame, stand facing a corner of a room. ?Choose one of the following positions as told by your health care provider: ?Place your hands and forearms on the door frame above your head. ?Place your hands and forearms on the door frame at the height of your head. ?Place your hands on the door frame at the height of your elbows. ?Slowly move your weight onto your front foot until you feel a stretch across your chest and in the front of your shoulders. Keep your head and chest upright and keep your abdominal muscles tight. ?Hold for __________ seconds. ?To release the stretch, shift your weight to your back foot. ?Repeat __________ times. Complete this exercise __________ times a day. ?Extension, standing ?Stand and hold a broomstick, a cane, or a similar object behind  your back. ?Your hands should be a little wider than shoulder width apart. ?Your palms should face away from your back. ?Keeping your elbows straight and your shoulder muscles relaxed, move the stick away from your body until you feel a stretch in your shoulders (extension). ?Avoid shrugging your shoulders while you move the stick. Keep your shoulder blades tucked down toward the middle of your back. ?Hold for __________ seconds. ?Slowly return to the starting position. ?Repeat __________ times. Complete this exercise __________ times a day. ?Range-of-motion exercises ?Pendulum ? ?Stand near a wall or a surface that you can hold onto for balance. ?Bend at the waist and let your left / right arm hang straight down. Use your other arm to support you. Keep your back straight and do not lock your knees. ?Relax your left / right arm and shoulder muscles, and move your hips and your trunk so your left / right arm swings freely. Your arm should swing because of the motion of your body, not because you are using your arm or shoulder muscles. ?Keep moving your hips and trunk so your arm swings in the following directions, as told by your health care provider: ?Side to side. ?Forward and backward. ?In clockwise and counterclockwise circles. ?Continue each motion for __________ seconds, or for as long as told by your health care provider. ?Slowly return to the starting position. ?Repeat __________ times. Complete this exercise __________ times a day. ?Shoulder flexion, standing ? ?Stand and hold a broomstick, a  cane, or a similar object. Place your hands a little more than shoulder width apart on the object. Your left / right hand should be palm up, and your other hand should be palm down. ?Keep your elbow straight and your shoulder muscles relaxed. Push the stick up with your healthy arm to raise your left / right arm in front of your body, and then over your head until you feel a stretch in your shoulder (flexion). ?Avoid  shrugging your shoulder while you raise your arm. Keep your shoulder blade tucked down toward the middle of your back. ?Hold for __________ seconds. ?Slowly return to the starting position. ?Repeat __________ times. Complete this exercise __________ times a day. ?Shoulder abduction, standing ?Stand and hold a broomstick, a cane, or a similar object. Place your hands a little more than shoulder width apart on the object. Your left / right hand should be palm up, and your other hand should be palm down. ?Keep your elbow straight and your shoulder muscles relaxed. Push the object across your body toward your left / right side. Raise your left / right arm to the side of your body (abduction) until you feel a stretch in your shoulder. ?Do not raise your arm above shoulder height unless your health care provider tells you to do that. ?If directed, raise your arm over your head. ?Avoid shrugging your shoulder while you raise your arm. Keep your shoulder blade tucked down toward the middle of your back. ?Hold for __________ seconds. ?Slowly return to the starting position. ?Repeat __________ times. Complete this exercise __________ times a day. ?Internal rotation ? ?Place your left / right hand behind your back, palm up. ?Use your other hand to dangle an exercise band, a towel, or a similar object over your shoulder. Grasp the band with your left / right hand so you are holding on to both ends. ?Gently pull up on the band until you feel a stretch in the front of your left / right shoulder. The movement of your arm toward the center of your body is called internal rotation. ?Avoid shrugging your shoulder while you raise your arm. Keep your shoulder blade tucked down toward the middle of your back. ?Hold for __________ seconds. ?Release the stretch by letting go of the band and lowering your hands. ?Repeat __________ times. Complete this exercise __________ times a day. ?Strengthening exercises ?External rotation ? ?Sit in a  stable chair without armrests. ?Secure an exercise band to a stable object at elbow height on your left / right side. ?Place a soft object, such as a folded towel or a small pillow, between your left / right upper arm and your body to move your elbow about 4 inches (10 cm) away from your side. ?Hold the end of the exercise band so it is tight and there is no slack. ?Keeping your elbow pressed against the soft object, slowly move your forearm out, away from your abdomen (external rotation). Keep your body steady so only your forearm moves. ?Hold for __________ seconds. ?Slowly return to the starting position. ?Repeat __________ times. Complete this exercise __________ times a day. ?Shoulder abduction ? ?Sit in a stable chair without armrests, or stand up. ?Hold a __________ weight in your left / right hand, or hold an exercise band with both hands. ?Start with your arms straight down and your left / right palm facing in, toward your body. ?Slowly lift your left / right hand out to your side (abduction). Do not lift your hand  above shoulder height unless your health care provider tells you that this is safe. ?Keep your arms straight. ?Avoid shrugging your shoulder while you do this movement. Keep your shoulder blade tucked down toward the middle of your back. ?Hold for __________ seconds. ?Slowly lower your arm, and return to the starting position. ?Repeat __________ times. Complete this exercise __________ times a day. ?Shoulder extension ?Sit in a stable chair without armrests, or stand up. ?Secure an exercise band to a stable object in front of you so it is at shoulder height. ?Hold one end of the exercise band in each hand. Your palms should face each other. ?Straighten your elbows and lift your hands up to shoulder height. ?Step back, away from the secured end of the exercise band, until the band is tight and there is no slack. ?Squeeze your shoulder blades together as you pull your hands down to the sides of  your thighs (extension). Stop when your hands are straight down by your sides. Do not let your hands go behind your body. ?Hold for __________ seconds. ?Slowly return to the starting position. ?Repeat _______

## 2022-04-08 ENCOUNTER — Telehealth: Payer: Self-pay

## 2022-04-08 DIAGNOSIS — M19012 Primary osteoarthritis, left shoulder: Secondary | ICD-10-CM | POA: Insufficient documentation

## 2022-04-08 DIAGNOSIS — R972 Elevated prostate specific antigen [PSA]: Secondary | ICD-10-CM | POA: Insufficient documentation

## 2022-04-08 DIAGNOSIS — D75839 Thrombocytosis, unspecified: Secondary | ICD-10-CM | POA: Insufficient documentation

## 2022-04-08 NOTE — Telephone Encounter (Signed)
Pt called in stating that his wife received a call from our office... Advised pt that you had call to give results... Pt stated that he will wait to get results on Monday at his appt...  ?

## 2022-04-08 NOTE — Telephone Encounter (Signed)
Lvm for pt to return call in regards to labs.  ?

## 2022-04-11 ENCOUNTER — Other Ambulatory Visit: Payer: Self-pay | Admitting: Internal Medicine

## 2022-04-12 ENCOUNTER — Encounter: Payer: Self-pay | Admitting: Internal Medicine

## 2022-04-12 ENCOUNTER — Ambulatory Visit (INDEPENDENT_AMBULATORY_CARE_PROVIDER_SITE_OTHER): Payer: Medicare Other | Admitting: Internal Medicine

## 2022-04-12 VITALS — BP 124/86 | HR 72 | Temp 98.4°F | Ht 74.0 in | Wt 203.2 lb

## 2022-04-12 DIAGNOSIS — L02425 Furuncle of right lower limb: Secondary | ICD-10-CM | POA: Diagnosis not present

## 2022-04-12 DIAGNOSIS — L0231 Cutaneous abscess of buttock: Secondary | ICD-10-CM | POA: Diagnosis not present

## 2022-04-12 DIAGNOSIS — R972 Elevated prostate specific antigen [PSA]: Secondary | ICD-10-CM | POA: Diagnosis not present

## 2022-04-12 DIAGNOSIS — I2699 Other pulmonary embolism without acute cor pulmonale: Secondary | ICD-10-CM | POA: Diagnosis not present

## 2022-04-12 NOTE — Patient Instructions (Addendum)
Start taking an antihistamine twice daily  through the pollen season because your cough may be aggravated by allergies  ? ? ?Return to Dr Bernardo Heater , the urologist you saw in November 2021 for the elevated PSA.  If he requires a referral let me kow ? ? ?I will make the referral to Dr Dahlia Byes to have the cyst on your buttock incised and drained  ? ? ?

## 2022-04-12 NOTE — Assessment & Plan Note (Signed)
Second thrombotic event . S/p thrombectomy by Dr Dew April 9 .  Continue Eliqiuis for life. Symptos resolved  

## 2022-04-12 NOTE — Progress Notes (Signed)
? ?Subjective:  ?Patient ID: Brian Atkins, male    DOB: 08-29-1956  Age: 66 y.o. MRN: [id number] ? ?CC: The primary encounter diagnosis was Furuncle of right thigh. Diagnoses of Elevated PSA, Cutaneous abscess of buttock, and Other acute pulmonary embolism, unspecified whether acute cor pulmonale present [facility]) were also pertinent to this visit. ? ? ?This visit occurred during the SARS-CoV-2 public health emergency.  Safety protocols were in place, including screening questions prior to the visit, additional usage of staff PPE, and extensive cleaning of exam room while observing appropriate contact time as indicated for disinfecting solutions.   ? ?HPI ?Brian Atkins presents for hospital follow up.  ?Chief Complaint  ?Patient presents with  ? Hospitalization Follow-up  ?  Hospital follow from a bowel obstruction at [facility] and Pulmonary Embolism at [facility]  ? ? ?Bowel ischemia;  presented with persistent N/V while vacationing, in march ,    went to [place] and diagnosed with SBO,  NG tube placed,  then transferred to  [facility] for exploratory lap .  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  ? ? ?Several days later he developed pleurisy and dyspnea. she was admitted to  [facility] on Easter Sunday April 9 with  PE after a CT angio chest which showed right middle and right lower lobe pulmonary emboli with associated right heart strain.  EDP consulted vascular surgery who recommended heparin drip.  And  PULMONARY THROMBECTOMY done by [name].  DISCHARGED ON April 11 on Eliquis for lifelong therapy as this was his second thrombotic event ? ?Saw [facility] on April 19 for left shoulder pain   sent for PT  films reviewed ? ?2 month history of painful boil on upper thigh .  Taking doxy and using hot compresses with no spontaneous draining .  Needs to see [facility]  ? ?He has had an 18 lb weight loss since January due to the SBO  ? ? ?Outpatient Medications  Prior to Visit  ?Medication Sig Dispense Refill  ? APIXABAN (ELIQUIS) VTE STARTER PACK ('10MG'$  AND '5MG'$ ) Take as directed on package: start with two-'5mg'$  tablets twice daily for 7 days. On day 8, switch to one-'5mg'$  tablet twice daily. 1 each 0  ? carvedilol (COREG) 3.125 MG tablet Take 1 tablet (3.125 mg total) by mouth 2 (two) times daily. 60 tablet 0  ? diphenhydrAMINE (BENADRYL) 50 MG tablet Take 1 tablet (50 mg total) by mouth once for 1 dose. Take 50 mg by mouth 1 hour prior to scan time 1 tablet 0  ? doxycycline (VIBRA-TABS) 100 MG tablet Take 1 tablet (100 mg total) by mouth 2 (two) times daily. With food x 7-10 days 20 tablet 0  ? famotidine (PEPCID) 20 MG tablet Take 20 mg by mouth 2 (two) times daily.    ? hydrochlorothiazide (HYDRODIURIL) 25 MG tablet Take 1 tablet (25 mg total) by mouth daily. 90 tablet 3  ? losartan (COZAAR) 100 MG tablet TAKE ONE TABLET BY MOUTH ONE TIME DAILY 90 tablet 2  ? Multiple Vitamins-Minerals (MULTIVITAMIN WITH MINERALS) tablet Take 1 tablet by mouth daily.    ? mupirocin ointment (BACTROBAN) 2 % Apply 1 application. topically 2 (two) times daily. In nose x 5 days 30 g 0  ? nitroGLYCERIN (NITROSTAT) 0.4 MG SL tablet Place 1 tablet (0.4 mg total) under the tongue every 5 (five) minutes as needed for chest pain. Maximum  of 3 doses. 35 tablet 3  ? omeprazole (PRILOSEC) 40 MG capsule Take 40 mg by mouth daily.    ? ondansetron (ZOFRAN) 4 MG tablet Take 1 tablet by mouth every 8 (eight) hours as needed.    ? ONETOUCH VERIO test strip USE TO CHECK BLOOD SUGAR AS NEEDED 100 strip 2  ? Prucalopride Succinate (MOTEGRITY) 2 MG TABS Take 2 mg by mouth daily as needed (constipation).    ? rOPINIRole (REQUIP) 0.25 MG tablet Take 0.25 mg by mouth in the morning and at bedtime.     ? Syringe/Needle, Disp, (SYRINGE 3CC/25GX1") 25G X 1" 3 ML MISC Use for b12 injections 50 each 0  ? tiZANidine (ZANAFLEX) 4 MG tablet Take 0.5-1 tablets (2-4 mg total) by mouth at bedtime as needed for muscle spasms.  30 tablet 2  ? triamcinolone (NASACORT) 55 MCG/ACT AERO nasal inhaler Place 2 sprays into the nose at bedtime.    ? zolpidem (AMBIEN CR) 12.5 MG CR tablet TAKE ONE TABLET BY MOUTH AT BEDTIME AS NEEDED FOR SLEEP 30 tablet 5  ? ?No facility-administered medications prior to visit.  ? ? ?Review of Systems; ? ?Patient denies headache, fevers, malaise, unintentional weight loss, skin rash, eye pain, sinus congestion and sinus pain, sore throat, dysphagia,  hemoptysis , cough, dyspnea, wheezing, chest pain, palpitations, orthopnea, edema, abdominal pain, nausea, melena, diarrhea, constipation, flank pain, dysuria, hematuria, urinary  Frequency, nocturia, numbness, tingling, seizures,  Focal weakness, Loss of consciousness,  Tremor, insomnia, depression, anxiety, and suicidal ideation.   ? ? ? ?Objective:  ?BP 124/86 (BP Location: Left Arm, Patient Position: Sitting, Cuff Size: Normal)   Pulse 72   Temp 98.4 ?F (36.9 ?C) (Oral)   Ht '6\' 2"'$  (1.88 m)   Wt 203 lb 3.2 oz (92.2 kg)   SpO2 97%   BMI 26.09 kg/m?  ? ?BP Readings from Last 3 Encounters:  ?04/12/22 124/86  ?04/07/22 118/74  ?03/30/22 131/79  ? ? ?Wt Readings from Last 3 Encounters:  ?04/12/22 203 lb 3.2 oz (92.2 kg)  ?04/07/22 204 lb 9.6 oz (92.8 kg)  ?03/29/22 197 lb 1.5 oz (89.4 kg)  ? ? ?General appearance: alert, cooperative and appears stated age ?Ears: normal TM's and external ear canals both ears ?Throat: lips, mucosa, and tongue normal; teeth and gums normal ?Neck: no adenopathy, no carotid bruit, supple, symmetrical, trachea midline and thyroid not enlarged, symmetric, no tenderness/mass/nodules ?Back: symmetric, no curvature. ROM normal. No CVA tenderness. ?Lungs: clear to auscultation bilaterally ?Heart: regular rate and rhythm, S1, S2 normal, no murmur, click, rub or gallop ?Abdomen: soft, non-tender; bowel sounds normal; no masses,  no organomegaly ?Pulses: 2+ and symmetric ?Skin: Skin color, texture, turgor normal. No rashes or lesions ?Lymph  nodes: Cervical, supraclavicular, and axillary nodes normal. ? ?Lab Results  ?Component Value Date  ? HGBA1C 6.0 04/07/2022  ? HGBA1C 6.0 09/23/2020  ? HGBA1C 5.8 03/10/2020  ? ? ?Lab Results  ?Component Value Date  ? CREATININE 0.89 03/30/2022  ? CREATININE 0.96 03/29/2022  ? CREATININE 1.02 03/29/2022  ? ? ?Lab Results  ?Component Value Date  ? WBC 9.3 04/07/2022  ? HGB 13.8 04/07/2022  ? HCT 41.2 04/07/2022  ? PLT 596.0 (H) 04/07/2022  ? GLUCOSE 163 (H) 03/30/2022  ? CHOL 253 (H) 04/07/2022  ? TRIG 225.0 (H) 04/07/2022  ? HDL 33.00 (L) 04/07/2022  ? LDLDIRECT 188.0 04/07/2022  ? [place] 77 07/22/2021  ? ALT 13 03/29/2022  ? AST 16 03/29/2022  ? NA 139  03/30/2022  ? K 4.0 03/30/2022  ? CL 105 03/30/2022  ? CREATININE 0.89 03/30/2022  ? BUN 15 03/30/2022  ? CO2 25 03/30/2022  ? TSH 1.46 04/07/2022  ? PSA 4.76 (H) 04/07/2022  ? INR 1.0 03/28/2022  ? HGBA1C 6.0 04/07/2022  ? MICROALBUR 0.5 10/24/2020  ? ? ?PERIPHERAL VASCULAR CATHETERIZATION ? ?Result Date: 03/29/2022 ?See surgical note for result. ? ?US Venous Img Lower Bilateral (DVT) ? ?Result Date: 03/29/2022 ?CLINICAL DATA:  CTA proven pulmonary emboli. Associated right heart strain. EXAM: BILATERAL LOWER EXTREMITY VENOUS DOPPLER ULTRASOUND TECHNIQUE: Gray-scale sonography with graded compression, as well as color Doppler and duplex ultrasound were performed to evaluate the lower extremity deep venous systems from the level of the common femoral vein and including the common femoral, femoral, profunda femoral, popliteal and calf veins including the posterior tibial, peroneal and gastrocnemius veins when visible. The superficial great saphenous vein was also interrogated. Spectral Doppler was utilized to evaluate flow at rest and with distal augmentation maneuvers in the common femoral, femoral and popliteal veins. COMPARISON:  None. FINDINGS: RIGHT LOWER EXTREMITY Common Femoral Vein: No evidence of thrombus. Normal compressibility, respiratory phasicity and  response to augmentation. Saphenofemoral Junction: No evidence of thrombus. Normal compressibility and flow on color Doppler imaging. Profunda Femoral Vein: No evidence of thrombus. Normal compressibility and flow on col

## 2022-04-12 NOTE — Assessment & Plan Note (Signed)
He will follow up with Dr Bernardo Heater  ?

## 2022-04-12 NOTE — Assessment & Plan Note (Signed)
No improvement or drainage despite use of doxycycline.  Referring to [facility] Dr Dahlia Byes for Incision & Drainage  ?

## 2022-04-21 ENCOUNTER — Encounter: Payer: Self-pay | Admitting: Surgery

## 2022-04-21 ENCOUNTER — Ambulatory Visit (INDEPENDENT_AMBULATORY_CARE_PROVIDER_SITE_OTHER): Payer: Medicare Other | Admitting: Surgery

## 2022-04-21 VITALS — BP 142/84 | HR 71 | Temp 97.9°F | Wt 200.4 lb

## 2022-04-21 DIAGNOSIS — L720 Epidermal cyst: Secondary | ICD-10-CM

## 2022-04-21 DIAGNOSIS — R1084 Generalized abdominal pain: Secondary | ICD-10-CM

## 2022-04-21 MED ORDER — CYCLOBENZAPRINE HCL 5 MG PO TABS: 5.0000 mg | ORAL_TABLET | Freq: Four times a day (QID) | ORAL | 0 refills | Status: DC | PRN

## 2022-04-21 NOTE — Patient Instructions (Addendum)
Please STOP Eliquis for 24 hours before your procedure.  ? ?Please PICK UP your prescription at your pharmacy. ? ? ?If you have any concerns or questions, please feel free to call our office. See follow up appointment below.  ? ?Epidermoid Cyst ? ?An epidermoid cyst, also called an epidermal cyst, is a small lump under your skin. The cyst contains a substance called keratin. Do not try to pop or open the cyst yourself. ?What are the causes? ?A blocked hair follicle. ?A hair that curls and re-enters the skin instead of growing straight out of the skin. ?A blocked pore. ?Irritated skin. ?An injury to the skin. ?Certain conditions that are passed along from parent to child. ?Human papillomavirus (HPV). This happens rarely when cysts occur on the bottom of the feet. ?Long-term sun damage to the skin. ?What increases the risk? ?Having acne. ?Being male. ?Having an injury to the skin. ?Being past puberty. ?Having certain conditions caused by genes (genetic disorder) ?What are the signs or symptoms? ?These cysts are usually harmless, but they can get infected. Symptoms of infection may include: ?Redness. ?Inflammation. ?Tenderness. ?Warmth. ?Fever. ?A bad-smelling substance that drains from the cyst. ?Pus that drains from the cyst. ?How is this treated? ?In many cases, epidermoid cysts go away on their own without treatment. If a cyst becomes infected, treatment may include: ?Opening and draining the cyst, done by a doctor. After draining, you may need minor surgery to remove the rest of the cyst. ?Antibiotic medicine. ?Shots of medicines (steroids) that help to reduce inflammation. ?Surgery to remove the cyst. Surgery may be done if the cyst: ?Becomes large. ?Bothers you. ?Has a chance of turning into cancer. ?Do not try to open a cyst yourself. ?Follow these instructions at home: ?Medicines ?Take over-the-counter and prescription medicines as told by your doctor. ?If you were prescribed an antibiotic medicine, take it as  told by your doctor. Do not stop taking it even if you start to feel better. ?General instructions ?Keep the area around your cyst clean and dry. ?Wear loose, dry clothing. ?Avoid touching your cyst. ?Check your cyst every day for signs of infection. Check for: ?Redness, swelling, or pain. ?Fluid or blood. ?Warmth. ?Pus or a bad smell. ?Keep all follow-up visits. ?How is this prevented? ?Wear clean, dry, clothing. ?Avoid wearing tight clothing. ?Keep your skin clean and dry. Take showers or baths every day. ?Contact a doctor if: ?Your cyst has symptoms of infection. ?Your condition does not improve or gets worse. ?You have a cyst that looks different from other cysts you have had. ?You have a fever. ?Get help right away if: ?Redness spreads from the cyst into the area close by. ?Summary ?An epidermoid cyst is a small lump under your skin. ?If a cyst becomes infected, treatment may include surgery to open and drain the cyst, or to remove it. ?Take over-the-counter and prescription medicines only as told by your doctor. ?Contact a doctor if your condition is not improving or is getting worse. ?Keep all follow-up visits. ?This information is not intended to replace advice given to you by your health care provider. Make sure you discuss any questions you have with your health care provider. ?Document Revised: 03/12/2020 Document Reviewed: 03/12/2020 ?Elsevier Patient Education ? [place]. ? ?

## 2022-04-22 ENCOUNTER — Encounter: Payer: Self-pay | Admitting: Internal Medicine

## 2022-04-22 ENCOUNTER — Encounter: Payer: Self-pay | Admitting: Surgery

## 2022-04-22 DIAGNOSIS — M19012 Primary osteoarthritis, left shoulder: Secondary | ICD-10-CM

## 2022-04-22 NOTE — Progress Notes (Signed)
Outpatient Surgical Follow Up ? ?04/22/2022 ? ?Brian Atkins is an 66 y.o. male.  ? ?Chief Complaint  ?Patient presents with  ? Follow-up  ?  Cyst right buttock  ? ? ?HPI: Brian Atkins is well-known to me with history of a small paraesophageal hernia more recently needed to be admitted to [facility] and underwent emergent laparotomy for small bowel obstruction.  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  The prior surgery was about 4 weeks ago.  He did well but he started having some intermittent right-sided abdominal pain moderate intensity and sharp in nature.  No specific alleviating or aggravating factors.  He also has right cystic lesion near buttocks.  No fevers no chills it is tender ? ?Past Medical History:  ?Diagnosis Date  ? Complication of anesthesia   ? slow to wake  ? Coronary artery disease, non-occlusive   ? a. LHC 12/18: ostLAD 20%, p/mLAD 60% FFR 0.84, ost ramus 50%, mRCA 50% FFR 0.94, EF 65%  ? COVID-19 virus infection 12/28/2019  ? Diverticulitis   ? Diverticulosis   ? Dyslipidemia   ? Dyspnea   ? Family history of adverse reaction to anesthesia   ? Mother - slow to wake  ? GERD (gastroesophageal reflux disease)   ? Hemorrhoids   ? Hiatal hernia   ? History of 2019 novel coronavirus disease (COVID-19) 12/24/2019  ? History of echocardiogram   ? a. TTE 1/19: EF of 60-65%, normal wall motion, normal LV diastolic function, mildly dilated LA  ? History of kidney stones   ? Labile hypertension   ? Lymphoma ([place])   ? Medial epicondylitis of right elbow   ? Meralgia paresthetica of right side   ? Microscopic hematuria   ? Ocular migraine   ? Pneumonia   ? Sleep apnea   ? CPAP  ? ? ?Past Surgical History:  ?Procedure Laterality Date  ? BRONCHIAL WASHINGS N/A 08/21/2021  ? Procedure: BRONCHIAL WASHINGS;  Surgeon: Ottie Glazier, MD;  Location: [facility] ORS;  Service: Thoracic;  Laterality: N/A;  ? COLONOSCOPY    ? FLEXIBLE BRONCHOSCOPY N/A 08/21/2021  ? Procedure: FLEXIBLE BRONCHOSCOPY;  Surgeon: Ottie Glazier, MD;   Location: [facility] ORS;  Service: Thoracic;  Laterality: N/A;  ? INTRAVASCULAR PRESSURE WIRE/FFR STUDY N/A 12/07/2017  ? Procedure: INTRAVASCULAR PRESSURE WIRE/FFR STUDY;  Surgeon: Nelva Bush, MD;  Location: [place] [facility];  Service: Cardiovascular;  Laterality: N/A;  ? LEFT HEART CATH AND CORONARY ANGIOGRAPHY Left 12/07/2017  ? Procedure: LEFT HEART CATH AND CORONARY ANGIOGRAPHY;  Surgeon: Nelva Bush, MD;  Location: [place] [facility];  Service: Cardiovascular;  Laterality: Left;  ? nissen funduplication  [id number]  ? Brian Atkins  ? PULMONARY THROMBECTOMY N/A 03/29/2022  ? Procedure: PULMONARY THROMBECTOMY;  Surgeon: Algernon Huxley, MD;  Location: [place] [facility];  Service: Cardiovascular;  Laterality: N/A;  ? SEPTOPLASTY Bilateral 08/23/2019  ? Procedure: SEPTOPLASTY;  Surgeon: [name], MD;  Location: [place];  Service: ENT;  Laterality: Bilateral;  ? SPLENECTOMY    ? TURBINATE REDUCTION Bilateral 08/23/2019  ? Procedure: TURBINATE REDUCTION;  Surgeon: [name], MD;  Location: [place];  Service: ENT;  Laterality: Bilateral;  Latex sensitivity ?sleep apnea  ? ? ?Family History  ?Problem Relation Age of Onset  ? Coronary artery disease Father 11  ?     CABG  ? Aortic aneurysm Father 35  ?     repaired  ? Hyperlipidemia Father   ? Lung cancer Mother   ?  Hypertension Mother   ? Cancer Mother   ?     bladder  ? COPD Mother   ? Stomach cancer Maternal Grandfather   ? ? ?Social History:  reports that he has never smoked. He has been exposed to tobacco smoke. He has never used smokeless tobacco. He reports that he does not drink alcohol and does not use drugs. ? ?Allergies:  ?Allergies  ?Allergen Reactions  ? Avelox [Moxifloxacin] Hives and Swelling  ?  Severe facial swelling  ? Gadolinium Derivatives Swelling  ?  (OK with pre-medication) ?(OK with pre-medication)  ? Contrast Media [Iodinated Contrast Media] Swelling  ?  (OK with pre-medication)  ? Crestor [Rosuvastatin  Calcium]   ?  myalgia  ? Morphine And Related Itching  ? Niacin   ?  Other reaction(s): Unknown  ? Niacin-Simvastatin Er   ?  myalgia  ? Latex Rash  ?  Gloves cause raxh  ? Tape Dermatitis  ? Tapentadol Dermatitis  ? Wound Dressing Adhesive Dermatitis  ? ? ?Medications reviewed. ? ? ? ?ROS ?Full ROS performed and is otherwise negative other than what is stated in HPI ? ? ?BP (!) 142/84   Pulse 71   Temp 97.9 ?F (36.6 ?C) (Oral)   Wt 200 lb 6.4 oz (90.9 kg)   SpO2 98%   BMI 25.73 kg/m?  ? ?Physical Exam ?Vitals and nursing note reviewed. Exam conducted with a chaperone present.  ?Constitutional:   ?   General: He is not in acute distress. ?   Appearance: Normal appearance. He is not toxic-appearing.  ?Cardiovascular:  ?   Rate and Rhythm: Normal rate and regular rhythm.  ?   Heart sounds: No murmur heard. ?Pulmonary:  ?   Effort: Pulmonary effort is normal. No respiratory distress.  ?   Breath sounds: Normal breath sounds. No stridor.  ?Abdominal:  ?   General: Abdomen is flat. There is no distension.  ?   Palpations: Abdomen is soft. There is no mass.  ?   Tenderness: There is no abdominal tenderness. There is no guarding.  ?   Hernia: No hernia is present.  ?   Comments: Prior laparotomy scar healing well with no infection or hernias.  ?Musculoskeletal:     ?   General: No swelling or tenderness. Normal range of motion.  ?   Cervical back: Normal range of motion. No rigidity or tenderness.  ?Lymphadenopathy:  ?   Cervical: No cervical adenopathy.  ?Skin: ?   General: Skin is warm and dry.  ?   Capillary Refill: Capillary refill takes less than 2 seconds.  ?   Comments: There Is evidence of a 3 x 3 cm cystic lesion in the right buttocks.  It is tender to palpation.  No evidence of abscess.  ?Neurological:  ?   General: No focal deficit present.  ?   Mental Status: He is alert and oriented to person, place, and time.  ?Psychiatric:     ?   Mood and Affect: Mood normal.     ?   Thought Content: Thought content  normal.     ?   Judgment: Judgment normal.  ? ? ?Assessment/Plan: ?abdominal pain and muscle spasm following exploratory laparotomy for bowel obstruction at [facility].  We will prescribe some Flexeril as needed.  At this time there is no evidence of complications related to his prior laparotomy.  There is no hernias or other issues. ?Does have a symptomatic epidermal inclusion cyst on the right  buttocks.  We can go ahead and schedule him in the office in a few weeks.  Discussed with the patient in detail for the procedure.  Risk, benefits and possible implications.  He is anticoagulated and we will hold anticoagulation only for 1 day given recent PE ? ? ?I spent 40 minutes in  coordination of his care, extensive review of medical records, personally reviewing his images, counseling the patient and performing appropriate documentation ?. ? ?Caroleen Hamman, MD FACS ?General Surgeon  ?

## 2022-04-26 NOTE — Telephone Encounter (Signed)
Patient called and would like a referral for [facility]. ? ?Patient would also like a refill on his APIXABAN (ELIQUIS) VTE STARTER PACK ('10MG'$  AND '5MG'$ ) ?

## 2022-04-28 ENCOUNTER — Telehealth: Payer: Self-pay

## 2022-04-28 MED ORDER — APIXABAN 5 MG PO TABS: 5.0000 mg | ORAL_TABLET | Freq: Two times a day (BID) | ORAL | 5 refills | Status: DC

## 2022-04-28 NOTE — Telephone Encounter (Signed)
Pt needs a refill on the Eliquis but the one in the chart is the starter pack and not sure if that one needs to be refilled or a maintenance dose.  ?

## 2022-04-28 NOTE — Telephone Encounter (Signed)
Lifelong maintenancedose of Eliquis sent to [facility] ? ?

## 2022-04-28 NOTE — Telephone Encounter (Signed)
Pt is aware that medication has been refilled.  °

## 2022-04-29 ENCOUNTER — Telehealth: Payer: Self-pay | Admitting: Internal Medicine

## 2022-04-29 NOTE — Telephone Encounter (Signed)
Made 3 attempts to schedule, removed from recall list ?

## 2022-04-29 NOTE — Telephone Encounter (Signed)
Noted  

## 2022-05-03 ENCOUNTER — Other Ambulatory Visit: Payer: Self-pay | Admitting: Surgery

## 2022-05-03 ENCOUNTER — Encounter: Payer: Self-pay | Admitting: Surgery

## 2022-05-03 ENCOUNTER — Ambulatory Visit (INDEPENDENT_AMBULATORY_CARE_PROVIDER_SITE_OTHER): Payer: Medicare Other | Admitting: Surgery

## 2022-05-03 VITALS — BP 129/85 | HR 81 | Temp 98.6°F | Wt 200.3 lb

## 2022-05-03 DIAGNOSIS — L723 Sebaceous cyst: Secondary | ICD-10-CM

## 2022-05-03 NOTE — Patient Instructions (Signed)
If you have any concerns or questions, please feel free to call our office. See follow up appointment below.  Epidermoid Cyst Removal, Care After  This sheet gives you information about how to care for yourself after your procedure. Your health care provider may also give you more specific instructions. If you have problems or questions, contact your health care provider. What can I expect after the procedure? After the procedure, it is common to have: Soreness in the area where your cyst was removed. Tightness or itchiness from the stitches (sutures) in your skin. Follow these instructions at home: Medicines Take over-the-counter and prescription medicines only as told by your health care provider. If you were prescribed an antibiotic medicine or ointment, take or apply it as told by your health care provider. Do not stop using the antibiotic even if you start to feel better. Incision care  Follow instructions from your health care provider about how to take care of your incision. Make sure you: Wash your hands with soap and water for at least 20 seconds before you change your bandage (dressing). If soap and water are not available, use hand sanitizer. Change your dressing as told by your health care provider. Leave sutures, skin glue, or adhesive strips in place. These skin closures may need to stay in place for 1-2 weeks or longer. If adhesive strip edges start to loosen and curl up, you may trim the loose edges. Do not remove adhesive strips completely unless your health care provider tells you to do that. Keep the dressing dry until your health care provider says that it can be removed. After your dressing is off, check your incision area every day for signs of infection. Check for: Redness, swelling, or pain. Fluid or blood. Warmth. Pus or a bad smell. General instructions Do not take baths, swim, or use a hot tub until your health care provider approves. Ask your health care provider  if you may take showers. You may only be allowed to take sponge baths. Your health care provider may ask you to avoid contact sports or activities that take a lot of effort. Do not do anything that stretches or puts pressure on your incision. You can return to your normal diet. Keep all follow-up visits. This is important. Contact a health care provider if: You have a fever. You have redness, swelling, or pain in the incision area. You have fluid or blood coming from your incision. You have pus or a bad smell coming from your incision. Your incision feels warm to the touch. Your cyst grows back. Get help right away if: If the incision site suddenly increases in size and you have pain at the incision site. You may be checked for a collection of blood under the skin from the procedure (hematoma). Summary After the procedure, it is common to have soreness in the area where your cyst was removed. Take or apply over-the-counter and prescription medicines only as told by your health care provider. Follow instructions from your health care provider about how to take care of your incision. This information is not intended to replace advice given to you by your health care provider. Make sure you discuss any questions you have with your health care provider. Document Revised: 03/12/2020 Document Reviewed: 03/12/2020 Elsevier Patient Education  2023 Elsevier Inc.   

## 2022-05-04 NOTE — Progress Notes (Signed)
PROCEDURE NOTE ?Excision of 32 mm epidermal inclusion cyst right buttocks ?Intermediate closure of 35 mm wound right buttocks ? ?ANESTHESIA: lidocaine 1% w epi ? ?EBL: minimal ? ?FINDINGS: 32 mm EIC ? ?After informed consent was obtained the patient was playing is in a prone position.  He was prepped and draped in the usual sterile fashion.  Elliptical incision was created incorporating the cystic lesion.  Knife was used to divide the cyst from adjacent structures.  Specimen was removed and sent for permanent pathology.  Hemostasis was obtained with pressure.  The wound was closed in a 2 layer fashion with interrupted 3-0 Vicryl for the subcu and 4-0 Monocryl for the skin in a subcuticular fashion.  Dermabond was applied. ?No complications ? ? ? ?

## 2022-05-05 ENCOUNTER — Telehealth: Payer: Self-pay

## 2022-05-05 NOTE — Telephone Encounter (Signed)
Patient notified of results.

## 2022-05-06 ENCOUNTER — Encounter: Payer: Self-pay | Admitting: Urology

## 2022-05-06 ENCOUNTER — Ambulatory Visit (INDEPENDENT_AMBULATORY_CARE_PROVIDER_SITE_OTHER): Payer: Medicare Other | Admitting: Urology

## 2022-05-06 ENCOUNTER — Encounter: Payer: Self-pay | Admitting: *Deleted

## 2022-05-06 VITALS — BP 147/83 | HR 84 | Ht 74.0 in | Wt 200.0 lb

## 2022-05-06 DIAGNOSIS — R972 Elevated prostate specific antigen [PSA]: Secondary | ICD-10-CM

## 2022-05-06 DIAGNOSIS — R3129 Other microscopic hematuria: Secondary | ICD-10-CM | POA: Diagnosis not present

## 2022-05-06 DIAGNOSIS — N400 Enlarged prostate without lower urinary tract symptoms: Secondary | ICD-10-CM

## 2022-05-06 LAB — URINALYSIS, COMPLETE
Bilirubin, UA: NEGATIVE
Glucose, UA: NEGATIVE
Ketones, UA: NEGATIVE
Leukocytes,UA: NEGATIVE
Nitrite, UA: NEGATIVE
Protein,UA: NEGATIVE
Specific Gravity, UA: 1.025 (ref 1.005–1.030)
Urobilinogen, Ur: 0.2 mg/dL (ref 0.2–1.0)
pH, UA: 6 (ref 5.0–7.5)

## 2022-05-06 LAB — MICROSCOPIC EXAMINATION
Bacteria, UA: NONE SEEN
Epithelial Cells (non renal): NONE SEEN /hpf (ref 0–10)

## 2022-05-06 MED ORDER — TAMSULOSIN HCL 0.4 MG PO CAPS: 0.4000 mg | ORAL_CAPSULE | Freq: Every day | ORAL | 0 refills | Status: DC

## 2022-05-06 NOTE — Addendum Note (Signed)
Addended by: Chrystie Nose on: 05/06/2022 01:01 PM   Modules accepted: Orders

## 2022-05-06 NOTE — Progress Notes (Signed)
05/06/2022 9:27 AM   Brian Atkins 11/03/1956 [id number]  Referring provider: [name], MD [address],  [place] [address]  Chief Complaint  Patient presents with   Elevated PSA    HPI: 66 y.o. male presents for follow-up/elevated PSA.  Initially seen 10/2020 for slow upward trend over 7 years which did not meet the criteria of an abnormal PSA velocity PSA 04/07/2022 was 4.76 He was admitted to [facility] in March 2023 for a bowel obstruction and underwent exploratory laparotomy.  He also had a pulmonary thrombectomy at [facility].  He did have a catheter during a portion of this hospitalization.  Urinalysis has not been checked No bothersome LUTS.  Denies dysuria, gross hematuria He had an inclusion cyst excised from his right buttock yesterday which he states is near the anal canal and tender   PMH: Past Medical History:  Diagnosis Date   Complication of anesthesia    slow to wake   Coronary artery disease, non-occlusive    a. LHC 12/18: ostLAD 20%, p/mLAD 60% FFR 0.84, ost ramus 50%, mRCA 50% FFR 0.94, EF 65%   COVID-19 virus infection 12/28/2019   Diverticulitis    Diverticulosis    Dyslipidemia    Dyspnea    Family history of adverse reaction to anesthesia    Mother - slow to wake   GERD (gastroesophageal reflux disease)    Hemorrhoids    Hiatal hernia    History of 2019 novel coronavirus disease (COVID-19) 12/24/2019   History of echocardiogram    a. TTE 1/19: EF of 60-65%, normal wall motion, normal LV diastolic function, mildly dilated LA   History of kidney stones    Labile hypertension    Lymphoma ([facility])    Medial epicondylitis of right elbow    Meralgia paresthetica of right side    Microscopic hematuria    Ocular migraine    Pneumonia    Sleep apnea    CPAP    Surgical History: Past Surgical History:  Procedure Laterality Date   BRONCHIAL WASHINGS N/A 08/21/2021   Procedure: BRONCHIAL WASHINGS;  Surgeon: Ottie Glazier, MD;   Location: [facility] ORS;  Service: Thoracic;  Laterality: N/A;   COLONOSCOPY     FLEXIBLE BRONCHOSCOPY N/A 08/21/2021   Procedure: FLEXIBLE BRONCHOSCOPY;  Surgeon: Ottie Glazier, MD;  Location: [facility] ORS;  Service: Thoracic;  Laterality: N/A;   INTRAVASCULAR PRESSURE WIRE/FFR STUDY N/A 12/07/2017   Procedure: INTRAVASCULAR PRESSURE WIRE/FFR STUDY;  Surgeon: Nelva Bush, MD;  Location: [place] [facility];  Service: Cardiovascular;  Laterality: N/A;   LEFT HEART CATH AND CORONARY ANGIOGRAPHY Left 12/07/2017   Procedure: LEFT HEART CATH AND CORONARY ANGIOGRAPHY;  Surgeon: Nelva Bush, MD;  Location: [place] [facility];  Service: Cardiovascular;  Laterality: Left;   nissen funduplication  2130   [name]   PULMONARY THROMBECTOMY N/A 03/29/2022   Procedure: PULMONARY THROMBECTOMY;  Surgeon: Algernon Huxley, MD;  Location: [place] [facility];  Service: Cardiovascular;  Laterality: N/A;   SEPTOPLASTY Bilateral 08/23/2019   Procedure: SEPTOPLASTY;  Surgeon: [name], MD;  Location: [place];  Service: ENT;  Laterality: Bilateral;   SPLENECTOMY     TURBINATE REDUCTION Bilateral 08/23/2019   Procedure: TURBINATE REDUCTION;  Surgeon: [name], MD;  Location: [place];  Service: ENT;  Laterality: Bilateral;  Latex sensitivity sleep apnea    Home Medications:  Allergies as of 05/06/2022       Reactions   Avelox [moxifloxacin] Hives, Swelling  Severe facial swelling   Gadolinium Derivatives Swelling   (OK with pre-medication) (OK with pre-medication)   Contrast Media [iodinated Contrast Media] Swelling   (OK with pre-medication)   Crestor [rosuvastatin Calcium]    myalgia   Morphine And Related Itching   Niacin    Other reaction(s): Unknown   Niacin-simvastatin Er    myalgia   Latex Rash   Gloves cause raxh   Tape Dermatitis   Tapentadol Dermatitis   Wound Dressing Adhesive Dermatitis        Medication List        Accurate as of May 06, 2022  9:27 AM. If you have any questions, ask your nurse or doctor.          STOP taking these medications    apixaban 5 MG Tabs tablet Commonly known as: ELIQUIS Stopped by: [name], MD   diphenhydrAMINE 50 MG tablet Commonly known as: BENADRYL Stopped by: [name], MD       TAKE these medications    carvedilol 3.125 MG tablet Commonly known as: COREG Take 1 tablet (3.125 mg total) by mouth 2 (two) times daily.   cyclobenzaprine 5 MG tablet Commonly known as: FLEXERIL Take 1 tablet (5 mg total) by mouth every 6 (six) hours as needed for muscle spasms.   famotidine 20 MG tablet Commonly known as: PEPCID Take 20 mg by mouth 2 (two) times daily.   hydrochlorothiazide 25 MG tablet Commonly known as: HYDRODIURIL Take 1 tablet (25 mg total) by mouth daily.   hydrochlorothiazide 12.5 MG capsule Commonly known as: MICROZIDE TAKE ONE CAPSULE BY MOUTH ONE TIME DAILY   losartan 100 MG tablet Commonly known as: COZAAR TAKE ONE TABLET BY MOUTH ONE TIME DAILY   Motegrity 2 MG Tabs Generic drug: Prucalopride Succinate Take 2 mg by mouth daily as needed (constipation).   multivitamin with minerals tablet Take 1 tablet by mouth daily.   mupirocin ointment 2 % Commonly known as: BACTROBAN Apply 1 application. topically 2 (two) times daily. In nose x 5 days   nitroGLYCERIN 0.4 MG SL tablet Commonly known as: NITROSTAT Place 1 tablet (0.4 mg total) under the tongue every 5 (five) minutes as needed for chest pain. Maximum of 3 doses.   omeprazole 40 MG capsule Commonly known as: PRILOSEC Take 40 mg by mouth daily.   ondansetron 4 MG tablet Commonly known as: ZOFRAN Take 1 tablet by mouth every 8 (eight) hours as needed.   OneTouch Verio test strip Generic drug: glucose blood USE TO CHECK BLOOD SUGAR AS NEEDED   rOPINIRole 0.25 MG tablet Commonly known as: REQUIP Take 0.25 mg by mouth in the morning and at bedtime.   SYRINGE 3CC/25GX1" 25G X 1" 3  ML Misc Use for b12 injections   tiZANidine 4 MG tablet Commonly known as: Zanaflex Take 0.5-1 tablets (2-4 mg total) by mouth at bedtime as needed for muscle spasms.   triamcinolone 55 MCG/ACT Aero nasal inhaler Commonly known as: NASACORT Place 2 sprays into the nose at bedtime.   zolpidem 12.5 MG CR tablet Commonly known as: AMBIEN CR TAKE ONE TABLET BY MOUTH AT BEDTIME AS NEEDED FOR SLEEP        Allergies:  Allergies  Allergen Reactions   Avelox [Moxifloxacin] Hives and Swelling    Severe facial swelling   Gadolinium Derivatives Swelling    (OK with pre-medication) (OK with pre-medication)   Contrast Media [Iodinated Contrast Media] Swelling    (OK with pre-medication)  Crestor [Rosuvastatin Calcium]     myalgia   Morphine And Related Itching   Niacin     Other reaction(s): Unknown   Niacin-Simvastatin Er     myalgia   Latex Rash    Gloves cause raxh   Tape Dermatitis   Tapentadol Dermatitis   Wound Dressing Adhesive Dermatitis    Family History: Family History  Problem Relation Age of Onset   Coronary artery disease Father 94       CABG   Aortic aneurysm Father 59       repaired   Hyperlipidemia Father    Lung cancer Mother    Hypertension Mother    Cancer Mother        bladder   COPD Mother    Stomach cancer Maternal Grandfather     Social History:  reports that he has never smoked. He has been exposed to tobacco smoke. He has never used smokeless tobacco. He reports that he does not drink alcohol and does not use drugs.   Physical Exam: BP (!) 147/83   Pulse 84   Ht _0  (1.88 m)   Wt 200 lb (90.7 kg)   BMI 25.68 kg/m   Constitutional:  Alert and oriented, No acute distress. HEENT: [place] AT, moist mucus membranes.  Trachea midline, no masses. Cardiovascular: No clubbing, cyanosis, or edema. Respiratory: Normal respiratory effort, no increased work of breathing. GU: Requested DRE not be performed due to recent inclusion cyst  excision Psychiatric: Normal mood and affect.    Assessment & Plan:    1.  Elevated PSA Mild PSA elevation Urinalysis ordered and if no WBCs would recommend a 30-day course of tamsulosin and a follow-up PSA in 1 month If PSA remains elevated above baseline recommend a prostate MRI.  Addendum: UA with 3-10 RBC.  Urine culture ordered and will start tamsulosin 0.4 mg daily.  Lab visit 1 month for PSA and repeat urinalysis   [name], MD  [place] [address] [place], [place] [address] [phone]

## 2022-05-10 ENCOUNTER — Ambulatory Visit (INDEPENDENT_AMBULATORY_CARE_PROVIDER_SITE_OTHER): Payer: Medicare Other | Admitting: Surgery

## 2022-05-10 ENCOUNTER — Encounter: Payer: Self-pay | Admitting: Surgery

## 2022-05-10 VITALS — BP 139/75 | HR 76 | Temp 97.8°F | Wt 200.8 lb

## 2022-05-10 DIAGNOSIS — Z09 Encounter for follow-up examination after completed treatment for conditions other than malignant neoplasm: Secondary | ICD-10-CM

## 2022-05-10 DIAGNOSIS — L723 Sebaceous cyst: Secondary | ICD-10-CM

## 2022-05-10 NOTE — Patient Instructions (Signed)
If you have any concerns or questions, please feel free to call our office.   Epidermoid Cyst Removal, Care After This sheet gives you information about how to care for yourself after your procedure. Your health care provider may also give you more specific instructions. If you have problems or questions, contact your health care provider. What can I expect after the procedure? After the procedure, it is common to have: Soreness in the area where your cyst was removed. Tightness or itchiness from the stitches (sutures) in your skin. Follow these instructions at home: Medicines Take over-the-counter and prescription medicines only as told by your health care provider. If you were prescribed an antibiotic medicine or ointment, take or apply it as told by your health care provider. Do not stop using the antibiotic even if you start to feel better. Incision care  Follow instructions from your health care provider about how to take care of your incision. Make sure you: Wash your hands with soap and water for at least 20 seconds before you change your bandage (dressing). If soap and water are not available, use hand sanitizer. Change your dressing as told by your health care provider. Leave sutures, skin glue, or adhesive strips in place. These skin closures may need to stay in place for 1-2 weeks or longer. If adhesive strip edges start to loosen and curl up, you may trim the loose edges. Do not remove adhesive strips completely unless your health care provider tells you to do that. Keep the dressing dry until your health care provider says that it can be removed. After your dressing is off, check your incision area every day for signs of infection. Check for: Redness, swelling, or pain. Fluid or blood. Warmth. Pus or a bad smell. General instructions Do not take baths, swim, or use a hot tub until your health care provider approves. Ask your health care provider if you may take showers. You may  only be allowed to take sponge baths. Your health care provider may ask you to avoid contact sports or activities that take a lot of effort. Do not do anything that stretches or puts pressure on your incision. You can return to your normal diet. Keep all follow-up visits. This is important. Contact a health care provider if: You have a fever. You have redness, swelling, or pain in the incision area. You have fluid or blood coming from your incision. You have pus or a bad smell coming from your incision. Your incision feels warm to the touch. Your cyst grows back. Get help right away if: If the incision site suddenly increases in size and you have pain at the incision site. You may be checked for a collection of blood under the skin from the procedure (hematoma). Summary After the procedure, it is common to have soreness in the area where your cyst was removed. Take or apply over-the-counter and prescription medicines only as told by your health care provider. Follow instructions from your health care provider about how to take care of your incision. This information is not intended to replace advice given to you by your health care provider. Make sure you discuss any questions you have with your health care provider. Document Revised: 03/12/2020 Document Reviewed: 03/12/2020 Elsevier Patient Education  [place].

## 2022-05-11 ENCOUNTER — Encounter: Payer: Self-pay | Admitting: Surgery

## 2022-05-11 LAB — CULTURE, URINE COMPREHENSIVE

## 2022-05-11 NOTE — Progress Notes (Signed)
S/p excision EIC Path d/w pt Had some pain but now resolved  PE NAD Wound healing well, no infection  A/p Doing well Pending GI eval at [facility]  RTC in a few months

## 2022-05-14 ENCOUNTER — Telehealth: Payer: Self-pay | Admitting: *Deleted

## 2022-05-14 NOTE — Telephone Encounter (Addendum)
Patient left VM on Triage line regarding tamsulosin causing dizziness taking it first thing in the morning. Per Dr. Bernardo Heater discontinue and follow up as scheduled. Patient informed voiced understanding.

## 2022-06-07 ENCOUNTER — Other Ambulatory Visit: Payer: Medicare Other

## 2022-06-07 DIAGNOSIS — R972 Elevated prostate specific antigen [PSA]: Secondary | ICD-10-CM

## 2022-06-07 LAB — URINALYSIS, COMPLETE
Bilirubin, UA: NEGATIVE
Glucose, UA: NEGATIVE
Ketones, UA: NEGATIVE
Leukocytes,UA: NEGATIVE
Nitrite, UA: NEGATIVE
Protein,UA: NEGATIVE
Specific Gravity, UA: 1.02 (ref 1.005–1.030)
Urobilinogen, Ur: 0.2 mg/dL (ref 0.2–1.0)
pH, UA: 6.5 (ref 5.0–7.5)

## 2022-06-07 LAB — MICROSCOPIC EXAMINATION: Bacteria, UA: NONE SEEN

## 2022-06-08 ENCOUNTER — Telehealth: Payer: Self-pay | Admitting: Urology

## 2022-06-08 ENCOUNTER — Encounter: Payer: Self-pay | Admitting: Urology

## 2022-06-08 LAB — PSA: Prostate Specific Ag, Serum: 2.8 ng/mL (ref 0.0–4.0)

## 2022-06-28 NOTE — Telephone Encounter (Signed)
error 

## 2022-06-29 ENCOUNTER — Telehealth: Payer: Self-pay

## 2022-06-29 ENCOUNTER — Encounter: Payer: Self-pay | Admitting: Internal Medicine

## 2022-06-29 ENCOUNTER — Telehealth (INDEPENDENT_AMBULATORY_CARE_PROVIDER_SITE_OTHER): Payer: Medicare Other | Admitting: Internal Medicine

## 2022-06-29 DIAGNOSIS — M19012 Primary osteoarthritis, left shoulder: Secondary | ICD-10-CM | POA: Diagnosis not present

## 2022-06-29 DIAGNOSIS — J0100 Acute maxillary sinusitis, unspecified: Secondary | ICD-10-CM | POA: Insufficient documentation

## 2022-06-29 DIAGNOSIS — K6389 Other specified diseases of intestine: Secondary | ICD-10-CM | POA: Diagnosis not present

## 2022-06-29 DIAGNOSIS — Z86711 Personal history of pulmonary embolism: Secondary | ICD-10-CM | POA: Diagnosis not present

## 2022-06-29 DIAGNOSIS — R3121 Asymptomatic microscopic hematuria: Secondary | ICD-10-CM

## 2022-06-29 NOTE — Assessment & Plan Note (Signed)
Recurrent,  Diagnosed last week with hydrogen breath test.  Methane level was very high.  Doxycycline prescribed,  Starting on July 11 for 14 days

## 2022-06-29 NOTE — Telephone Encounter (Signed)
Patient's wife, Zachery Niswander, called for instruction on how to connect for patient's MyChart visit today.  Vicente Males was having trouble connecting to [employer].  I gave her the number for the [place] service desk.  I also called Adair Laundry, CMA, to let her know.  Janett Billow states she will send a link to patient's cell phone.

## 2022-06-29 NOTE — Assessment & Plan Note (Signed)
Advised to start the doxycycline prescribed by Dr Evangeline Gula for concurrent SIBO , continue Nasocort,  Add Afrin and guaifenesin

## 2022-06-29 NOTE — Progress Notes (Signed)
Virtual Visit via [facility]   Note    This format is felt to be most appropriate for this patient at this time.  All issues noted in this document were discussed and addressed.  No physical exam was performed (except for noted visual exam findings with Video Visits).   I connected withNAME on 06/29/22 at  4:45 PM EDT by a video enabled telemedicine application and verified that I am speaking with the correct person using two identifiers. Location patient: home Location provider: work or home office Persons participating in the virtual visit: patient, provider  I discussed the limitations, risks, security and privacy concerns of performing an evaluation and management service by telephone and the availability of in person appointments. I also discussed with the patient that there may be a patient responsible charge related to this service. The patient expressed understanding and agreed to proceed.  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 Sick contacts at work.  Diagnosed with SIBO last week by Dr Evangeline Gula,  received an rx for doxycycline x 14 days today, which he has not started  Left shoulder pain :  diagnosed with partial rotator cuff tear of supraspinatus and subscapularis tendons by Dr Mack Guise.  Subacromial steroid injection did not relieve his pain.  Surgery recommended but not scheduled yet.  Elevated PSA: seen by [facility],  repeat PSA improved but persistent microscopic hematuria noted ; cystoscopy needed    ROS: See pertinent positives and negatives per HPI.  Past Medical History:  Diagnosis Date   Complication of anesthesia    slow to wake   Coronary artery disease, non-occlusive    a. LHC 12/18: ostLAD 20%, p/mLAD 60% FFR 0.84, ost ramus 50%, mRCA 50% FFR 0.94, EF 65%   COVID-19 virus infection 12/28/2019    Diverticulitis    Diverticulosis    Dyslipidemia    Dyspnea    Family history of adverse reaction to anesthesia    Mother - slow to wake   GERD (gastroesophageal reflux disease)    Hemorrhoids    Hiatal hernia    History of 2019 novel coronavirus disease (COVID-19) 12/24/2019   History of echocardiogram    a. TTE 1/19: EF of 60-65%, normal wall motion, normal LV diastolic function, mildly dilated LA   History of kidney stones    Labile hypertension    Lymphoma (HCC)    Medial epicondylitis of right elbow    Meralgia paresthetica of right side    Microscopic hematuria    Ocular migraine    Pneumonia    Sleep apnea    CPAP    Past Surgical History:  Procedure Laterality Date   BRONCHIAL WASHINGS N/A 08/21/2021   Procedure: BRONCHIAL WASHINGS;  Surgeon: Ottie Glazier, MD;  Location: [facility];  Service: Thoracic;  Laterality: N/A;   COLONOSCOPY     FLEXIBLE BRONCHOSCOPY N/A 08/21/2021   Procedure: FLEXIBLE BRONCHOSCOPY;  Surgeon: Ottie Glazier, MD;  Location: [facility];  Service: Thoracic;  Laterality: N/A;   INTRAVASCULAR PRESSURE WIRE/FFR STUDY N/A 12/07/2017   Procedure: INTRAVASCULAR PRESSURE WIRE/FFR STUDY;  Surgeon: Nelva Bush, MD;  Location: [place] [facility];  Service: Cardiovascular;  Laterality: N/A;   LEFT HEART CATH AND CORONARY ANGIOGRAPHY Left 12/07/2017   Procedure: LEFT HEART CATH AND CORONARY ANGIOGRAPHY;  Surgeon: Nelva Bush, MD;  Location: [place] [facility];  Service: Cardiovascular;  Laterality: Left;   nissen funduplication  [id number]   [name]   PULMONARY THROMBECTOMY N/A 03/29/2022   Procedure: PULMONARY THROMBECTOMY;  Surgeon: Algernon Huxley, MD;  Location: [place] [facility];  Service: Cardiovascular;  Laterality: N/A;   SEPTOPLASTY Bilateral 08/23/2019   Procedure: SEPTOPLASTY;  Surgeon: [name], MD;  Location: [facility];  Service: ENT;  Laterality: Bilateral;   SPLENECTOMY     TURBINATE REDUCTION Bilateral 08/23/2019    Procedure: TURBINATE REDUCTION;  Surgeon: [name], MD;  Location: [place];  Service: ENT;  Laterality: Bilateral;  Latex sensitivity sleep apnea    Family History  Problem Relation Age of Onset   Coronary artery disease Father 26       CABG   Aortic aneurysm Father 59       repaired   Hyperlipidemia Father    Lung cancer Mother    Hypertension Mother    Cancer Mother        bladder   COPD Mother    Stomach cancer Maternal Grandfather     SOCIAL HX:  reports that he has never smoked. He has been exposed to tobacco smoke. He has never used smokeless tobacco. He reports that he does not drink alcohol and does not use drugs.    Current Outpatient Medications:    carvedilol (COREG) 3.125 MG tablet, Take 1 tablet (3.125 mg total) by mouth 2 (two) times daily., Disp: 60 tablet, Rfl: 0   ELIQUIS 5 MG TABS tablet, Take 5 mg by mouth 2 (two) times daily., Disp: , Rfl:    famotidine (PEPCID) 20 MG tablet, Take 20 mg by mouth 2 (two) times daily., Disp: , Rfl:    hydrochlorothiazide (MICROZIDE) 12.5 MG capsule, TAKE ONE CAPSULE BY MOUTH ONE TIME DAILY, Disp: 90 capsule, Rfl: 0   losartan (COZAAR) 100 MG tablet, TAKE ONE TABLET BY MOUTH ONE TIME DAILY, Disp: 90 tablet, Rfl: 2   Multiple Vitamins-Minerals (MULTIVITAMIN WITH MINERALS) tablet, Take 1 tablet by mouth daily., Disp: , Rfl:    mupirocin ointment (BACTROBAN) 2 %, Apply 1 application. topically 2 (two) times daily. In nose x 5 days, Disp: 30 g, Rfl: 0   nitroGLYCERIN (NITROSTAT) 0.4 MG SL tablet, Place 1 tablet (0.4 mg total) under the tongue every 5 (five) minutes as needed for chest pain. Maximum of 3 doses., Disp: 35 tablet, Rfl: 3   omeprazole (PRILOSEC) 40 MG capsule, Take 40 mg by mouth daily., Disp: , Rfl:    ondansetron (ZOFRAN) 4 MG tablet, Take 1 tablet by mouth every 8 (eight) hours as needed., Disp: , Rfl:    ONETOUCH VERIO test strip, USE TO CHECK BLOOD SUGAR AS NEEDED, Disp: 100 strip, Rfl: 2    Prucalopride Succinate (MOTEGRITY) 2 MG TABS, Take 2 mg by mouth daily as needed (constipation)., Disp: , Rfl:    rOPINIRole (REQUIP) 0.25 MG tablet, Take 0.25 mg by mouth in the morning and at bedtime. , Disp: , Rfl:    Syringe/Needle, Disp, (SYRINGE 3CC/25GX1") 25G X 1" 3 ML MISC, Use for b12 injections, Disp: 50 each, Rfl: 0   triamcinolone (NASACORT) 55 MCG/ACT AERO nasal inhaler, Place 2 sprays into the nose at bedtime., Disp: , Rfl:    zolpidem (AMBIEN CR) 12.5 MG CR tablet, TAKE ONE TABLET BY MOUTH AT BEDTIME AS NEEDED FOR SLEEP, Disp: 30 tablet, Rfl: 5  EXAM:  VITALS per patient if applicable:  GENERAL: alert, oriented, appears well and in no acute distress  HEENT: atraumatic, conjunttiva clear, no obvious abnormalities on inspection of external nose and ears  NECK: normal movements of the head and neck  LUNGS: on inspection no signs of respiratory distress, breathing rate appears normal, no obvious gross SOB, gasping or wheezing  CV: no obvious cyanosis  MS: moves all visible extremities without noticeable abnormality  PSYCH/NEURO: pleasant and cooperative, no obvious depression or anxiety, speech and thought processing grossly intact  ASSESSMENT AND PLAN:  Discussed the following assessment and plan:  Acute non-recurrent maxillary sinusitis  Small intestinal bacterial overgrowth  Arthritis of left shoulder region  History of pulmonary embolism  Asymptomatic microscopic hematuria  Sinusitis, acute maxillary Advised to start the doxycycline prescribed by Dr Evangeline Gula for concurrent SIBO , continue Nasocort,  Add Afrin and guaifenesin  Small intestinal bacterial overgrowth Recurrent,  Diagnosed last week with hydrogen breath test.  Methane level was very high.  Doxycycline prescribed,  Starting on July 11 for 14 days   Arthritis of left shoulder region Complicated by rotator cuff tear involving supraspinatus and subscapularis.  Surgery advised and planned  History  of pulmonary embolism Second thrombotic event . S/p thrombectomy by Dr Lucky Cowboy April 9 .  Continue Eliqiuis for life. Symptos resolved   Asymptomatic microscopic hematuria He has been advised to schedule cystoscopy with Dr Bernardo Heater     I discussed the assessment and treatment plan with the patient. The patient was provided an opportunity to ask questions and all were answered. The patient agreed with the plan and demonstrated an understanding of the instructions.   The patient was advised to call back or seek an in-person evaluation if the symptoms worsen or if the condition fails to improve as anticipated.   I spent 30 minutes dedicated to the care of this patient on the date of this encounter to include pre-visit review of his medical history,  recent evaluations by orthopedics,  GI and urology,  Face-to-face time with the patient , and post visit ordering of testing and therapeutics.    [name], MD

## 2022-06-29 NOTE — Assessment & Plan Note (Signed)
Complicated by rotator cuff tear involving supraspinatus and subscapularis.  Surgery advised and planned

## 2022-06-29 NOTE — Assessment & Plan Note (Signed)
He has been advised to schedule cystoscopy with Dr Bernardo Heater

## 2022-06-29 NOTE — Assessment & Plan Note (Signed)
Second thrombotic event . S/p thrombectomy by Dr Lucky Cowboy April 9 .  Continue Eliqiuis for life. Symptos resolved

## 2022-06-29 NOTE — Telephone Encounter (Signed)
Was able to connect with pt via video.

## 2022-07-02 ENCOUNTER — Other Ambulatory Visit: Payer: Self-pay | Admitting: Internal Medicine

## 2022-07-02 ENCOUNTER — Other Ambulatory Visit: Payer: Self-pay | Admitting: Urology

## 2022-07-02 DIAGNOSIS — M25512 Pain in left shoulder: Secondary | ICD-10-CM

## 2022-07-02 DIAGNOSIS — R519 Headache, unspecified: Secondary | ICD-10-CM

## 2022-07-02 DIAGNOSIS — M542 Cervicalgia: Secondary | ICD-10-CM

## 2022-07-02 DIAGNOSIS — M5030 Other cervical disc degeneration, unspecified cervical region: Secondary | ICD-10-CM

## 2022-07-02 DIAGNOSIS — M19012 Primary osteoarthritis, left shoulder: Secondary | ICD-10-CM

## 2022-07-04 ENCOUNTER — Telehealth: Payer: Self-pay | Admitting: Urology

## 2022-07-04 NOTE — Telephone Encounter (Signed)
I sent pt a mychart msg 06/08/22 recc cysto. Pls call to see if he has any questions and to sched

## 2022-07-05 NOTE — Telephone Encounter (Signed)
Notified patient as instructed,scheduled cysto

## 2022-07-10 ENCOUNTER — Other Ambulatory Visit: Payer: Self-pay | Admitting: Internal Medicine

## 2022-07-12 ENCOUNTER — Ambulatory Visit (INDEPENDENT_AMBULATORY_CARE_PROVIDER_SITE_OTHER): Payer: Medicare Other | Admitting: Internal Medicine

## 2022-07-12 ENCOUNTER — Encounter: Payer: Self-pay | Admitting: Internal Medicine

## 2022-07-12 VITALS — BP 138/88 | HR 64 | Temp 98.1°F | Ht 74.0 in | Wt 208.2 lb

## 2022-07-12 DIAGNOSIS — G4733 Obstructive sleep apnea (adult) (pediatric): Secondary | ICD-10-CM

## 2022-07-12 DIAGNOSIS — E538 Deficiency of other specified B group vitamins: Secondary | ICD-10-CM | POA: Diagnosis not present

## 2022-07-12 DIAGNOSIS — I700 Atherosclerosis of aorta: Secondary | ICD-10-CM

## 2022-07-12 DIAGNOSIS — K6389 Other specified diseases of intestine: Secondary | ICD-10-CM

## 2022-07-12 DIAGNOSIS — R3121 Asymptomatic microscopic hematuria: Secondary | ICD-10-CM

## 2022-07-12 DIAGNOSIS — K638219 Small intestinal bacterial overgrowth, unspecified: Secondary | ICD-10-CM

## 2022-07-12 DIAGNOSIS — M791 Myalgia, unspecified site: Secondary | ICD-10-CM

## 2022-07-12 DIAGNOSIS — T466X5A Adverse effect of antihyperlipidemic and antiarteriosclerotic drugs, initial encounter: Secondary | ICD-10-CM

## 2022-07-12 MED ORDER — CARVEDILOL 12.5 MG PO TABS
12.5000 mg | ORAL_TABLET | Freq: Two times a day (BID) | ORAL | 0 refills | Status: DC
Start: 2022-07-12 — End: 2022-11-08

## 2022-07-12 NOTE — Progress Notes (Unsigned)
Subjective:  Patient ID: Brian Atkins, male    DOB: 1956-03-20  Age: 66 y.o. MRN: [id number]  CC: There were no encounter diagnoses.   HPI Brian Atkins presents for No chief complaint on file.  Recent events:  1) small bowel obstruction due to an internal hernia under a loop of omentum, and underwent laparotomy and enterolysis during which ischemic mid-jejunum immediately reperfused, and did not require any resection on 02/27/2022. The postoperative course was uneventful and he was discharged home on 3/15.   2) treated at [facility] on 4/9  for  shortness of breath and was diagnosed with a PE leading to RV strain and requiring thrombectomy. As this was the 2nd episode, he was been put on lifelong anticoagulation with eliquis.   3) Diarrhea, bloating,   reviewed recent [facility] workup by Brian Atkins>  Diagnosed with recurrent SIBO (treated with rixamin x 14 days) and visceral hypersensitivity  to be treated with duloxetine.    4) B12 deficiency: has been taking shots since 2019 5) hematuria and PSA 2.8  scheduled to see urology in 2 weeks  6) left shoulder arthroscopy needed.  Entire arm now hurting  Brian Atkins.    7)  OSA;  sleeping well with ambien and CPAP  Lab Results  Component Value Date   VITAMINB12 431 04/15/2020    Outpatient Medications Prior to Visit  Medication Sig Dispense Refill   carvedilol (COREG) 3.125 MG tablet Take 1 tablet (3.125 mg total) by mouth 2 (two) times daily. 60 tablet 0   cyanocobalamin (,VITAMIN B-12,) 1000 MCG/ML injection INJECT 1ML INTO THE MUSCLE EVERY 30 DAYS 10 mL 0   ELIQUIS 5 MG TABS tablet Take 5 mg by mouth 2 (two) times daily.     famotidine (PEPCID) 20 MG tablet Take 20 mg by mouth 2 (two) times daily.     hydrochlorothiazide (MICROZIDE) 12.5 MG capsule TAKE ONE CAPSULE BY MOUTH ONE TIME DAILY 90 capsule 0   losartan (COZAAR) 100 MG tablet TAKE ONE TABLET BY MOUTH ONE TIME DAILY 90 tablet 2   Multiple Vitamins-Minerals (MULTIVITAMIN WITH  MINERALS) tablet Take 1 tablet by mouth daily.     mupirocin ointment (BACTROBAN) 2 % Apply 1 application. topically 2 (two) times daily. In nose x 5 days 30 g 0   nitroGLYCERIN (NITROSTAT) 0.4 MG SL tablet Place 1 tablet (0.4 mg total) under the tongue every 5 (five) minutes as needed for chest pain. Maximum of 3 doses. 35 tablet 3   omeprazole (PRILOSEC) 40 MG capsule Take 40 mg by mouth daily.     ondansetron (ZOFRAN) 4 MG tablet Take 1 tablet by mouth every 8 (eight) hours as needed.     ONETOUCH VERIO test strip USE TO CHECK BLOOD SUGAR AS NEEDED 100 strip 2   Prucalopride Succinate (MOTEGRITY) 2 MG TABS Take 2 mg by mouth daily as needed (constipation).     rOPINIRole (REQUIP) 0.25 MG tablet Take 0.25 mg by mouth in the morning and at bedtime.      Syringe/Needle, Disp, (SYRINGE 3CC/25GX1") 25G X 1" 3 ML MISC Use for b12 injections 50 each 0   tiZANidine (ZANAFLEX) 4 MG tablet TAKE ONE-HALF TO ONE TABLET BY MOUTH AT BEDTIME AS NEEDED FOR MUSCLE SPASM 30 tablet 2   triamcinolone (NASACORT) 55 MCG/ACT AERO nasal inhaler Place 2 sprays into the nose at bedtime.     zolpidem (AMBIEN CR) 12.5 MG CR tablet TAKE ONE TABLET BY MOUTH AT BEDTIME AS NEEDED FOR SLEEP  30 tablet 5   No facility-administered medications prior to visit.    Review of Systems;  Patient denies headache, fevers, malaise, unintentional weight loss, skin rash, eye pain, sinus congestion and sinus pain, sore throat, dysphagia,  hemoptysis , cough, dyspnea, wheezing, chest pain, palpitations, orthopnea, edema, abdominal pain, nausea, melena, diarrhea, constipation, flank pain, dysuria, hematuria, urinary  Frequency, nocturia, numbness, tingling, seizures,  Focal weakness, Loss of consciousness,  Tremor, insomnia, depression, anxiety, and suicidal ideation.      Objective:  There were no vitals taken for this visit.  BP Readings from Last 3 Encounters:  06/29/22 140/90  05/10/22 139/75  05/06/22 (!) 147/83    Wt Readings  from Last 3 Encounters:  06/29/22 200 lb (90.7 kg)  05/10/22 200 lb 12.8 oz (91.1 kg)  05/06/22 200 lb (90.7 kg)    General appearance: alert, cooperative and appears stated age Ears: normal TM's and external ear canals both ears Throat: lips, mucosa, and tongue normal; teeth and gums normal Neck: no adenopathy, no carotid bruit, supple, symmetrical, trachea midline and thyroid not enlarged, symmetric, no tenderness/mass/nodules Back: symmetric, no curvature. ROM normal. No CVA tenderness. Lungs: clear to auscultation bilaterally Heart: regular rate and rhythm, S1, S2 normal, no murmur, click, rub or gallop Abdomen: soft, non-tender; bowel sounds normal; no masses,  no organomegaly Pulses: 2+ and symmetric Skin: Skin color, texture, turgor normal. No rashes or lesions Lymph nodes: Cervical, supraclavicular, and axillary nodes normal.  Lab Results  Component Value Date   HGBA1C 6.0 04/07/2022   HGBA1C 6.0 09/23/2020   HGBA1C 5.8 03/10/2020    Lab Results  Component Value Date   CREATININE 0.89 03/30/2022   CREATININE 0.96 03/29/2022   CREATININE 1.02 03/29/2022    Lab Results  Component Value Date   WBC 9.3 04/07/2022   HGB 13.8 04/07/2022   HCT 41.2 04/07/2022   PLT 596.0 (H) 04/07/2022   GLUCOSE 163 (H) 03/30/2022   CHOL 253 (H) 04/07/2022   TRIG 225.0 (H) 04/07/2022   HDL 33.00 (L) 04/07/2022   LDLDIRECT 188.0 04/07/2022   LDLCALC 77 07/22/2021   ALT 13 03/29/2022   AST 16 03/29/2022   NA 139 03/30/2022   K 4.0 03/30/2022   CL 105 03/30/2022   CREATININE 0.89 03/30/2022   BUN 15 03/30/2022   CO2 25 03/30/2022   TSH 1.46 04/07/2022   PSA 4.76 (H) 04/07/2022   INR 1.0 03/28/2022   HGBA1C 6.0 04/07/2022   MICROALBUR 0.5 10/24/2020    ECHOCARDIOGRAM COMPLETE  Result Date: 03/29/2022    ECHOCARDIOGRAM REPORT   Patient Name:   Brian Atkins Date of Exam: 03/29/2022 Medical Rec #:  [id number]     Height:       74.0 in Accession #:    [id number]    Weight:        197.1 lb Date of Birth:  February 27, 1956      BSA:          2.160 m Patient Age:    43 years      BP:           98/63 mmHg Patient Gender: M             HR:           67 bpm. Exam Location:  [facility] Procedure: 2D Echo, Cardiac Doppler and Color Doppler Indications:     I26.09 Pulmonary embolus  History:         Patient has prior history of  Echocardiogram examinations. CAD,                  Signs/Symptoms:Shortness of Breath; Risk Factors:Hypertension                  and Dyslipidemia.  Sonographer:     Rosalia Hammers Referring Phys:  [id number] [place] Diagnosing Phys: Harrell Gave End MD  Sonographer Comments: Suboptimal apical window and no subcostal window. IMPRESSIONS  1. Left ventricular ejection fraction, by estimation, is 60 to 65%. The left ventricle has normal function. The left ventricle has no regional wall motion abnormalities. Left ventricular diastolic parameters are consistent with Grade II diastolic dysfunction (pseudonormalization).  2. Right ventricular systolic function is normal. The right ventricular size is mildly enlarged. Tricuspid regurgitation signal is inadequate for assessing PA pressure.  3. The mitral valve is normal in structure. No evidence of mitral valve regurgitation. No evidence of mitral stenosis.  4. The aortic valve is tricuspid. There is mild thickening of the aortic valve. Aortic valve regurgitation is not visualized. No aortic stenosis is present. FINDINGS  Left Ventricle: Left ventricular ejection fraction, by estimation, is 60 to 65%. The left ventricle has normal function. The left ventricle has no regional wall motion abnormalities. The left ventricular internal cavity size was normal in size. There is  borderline left ventricular hypertrophy. Left ventricular diastolic parameters are consistent with Grade II diastolic dysfunction (pseudonormalization). Right Ventricle: The right ventricular size is mildly enlarged. No increase in right ventricular wall thickness. Right  ventricular systolic function is normal. Tricuspid regurgitation signal is inadequate for assessing PA pressure. Left Atrium: Left atrial size was normal in size. Right Atrium: Right atrial size was normal in size. Pericardium: The pericardium was not well visualized. Mitral Valve: The mitral valve is normal in structure. No evidence of mitral valve regurgitation. No evidence of mitral valve stenosis. MV peak gradient, 3.8 mmHg. The mean mitral valve gradient is 2.0 mmHg. Tricuspid Valve: The tricuspid valve is normal in structure. Tricuspid valve regurgitation is trivial. Aortic Valve: The aortic valve is tricuspid. There is mild thickening of the aortic valve. Aortic valve regurgitation is not visualized. No aortic stenosis is present. Aortic valve mean gradient measures 4.0 mmHg. Aortic valve peak gradient measures 7.4 mmHg. Aortic valve area, by VTI measures 3.23 cm. Pulmonic Valve: The pulmonic valve was normal in structure. Pulmonic valve regurgitation is not visualized. No evidence of pulmonic stenosis. Aorta: The aortic root is normal in size and structure. Pulmonary Artery: The pulmonary artery is of normal size. Venous: The inferior vena cava was not well visualized. IAS/Shunts: The interatrial septum was not well visualized.  LEFT VENTRICLE PLAX 2D LVIDd:         4.07 cm   Diastology LVIDs:         2.44 cm   LV e' medial:    6.42 cm/s LV PW:         1.05 cm   LV E/e' medial:  12.6 LV IVS:        0.97 cm   LV e' lateral:   9.57 cm/s LVOT diam:     2.10 cm   LV E/e' lateral: 8.5 LV SV:         85 LV SV Index:   39 LVOT Area:     3.46 cm  RIGHT VENTRICLE RV Basal diam:  4.80 cm RV S prime:     12.70 cm/s LEFT ATRIUM  Index        RIGHT ATRIUM           Index LA diam:        4.10 cm 1.90 cm/m   RA Area:     15.20 cm LA Vol (A2C):   43.7 ml 20.23 ml/m  RA Volume:   36.70 ml  16.99 ml/m LA Vol (A4C):   55.4 ml 25.65 ml/m LA Biplane Vol: 49.7 ml 23.01 ml/m  AORTIC VALVE                     PULMONIC VALVE AV Area (Vmax):    3.21 cm     PV Vmax:       1.71 m/s AV Area (Vmean):   3.01 cm     PV Vmean:      114.000 cm/s AV Area (VTI):     3.23 cm     PV VTI:        0.332 m AV Vmax:           136.00 cm/s  PV Peak grad:  11.7 mmHg AV Vmean:          99.400 cm/s  PV Mean grad:  6.0 mmHg AV VTI:            0.262 m AV Peak Grad:      7.4 mmHg AV Mean Grad:      4.0 mmHg LVOT Vmax:         126.00 cm/s LVOT Vmean:        86.300 cm/s LVOT VTI:          0.244 m LVOT/AV VTI ratio: 0.93  AORTA Ao Root diam: 3.40 cm MITRAL VALVE MV Area (PHT): 4.17 cm    SHUNTS MV Area VTI:   2.78 cm    Systemic VTI:  0.24 m MV Peak grad:  3.8 mmHg    Systemic Diam: 2.10 cm MV Mean grad:  2.0 mmHg MV Vmax:       0.98 m/s MV Vmean:      60.7 cm/s MV Decel Time: 182 msec MV E velocity: 81.00 cm/s MV A velocity: 79.30 cm/s MV E/A ratio:  1.02 Harrell Gave End MD Electronically signed by Nelva Bush MD Signature Date/Time: 03/29/2022/5:07:10 PM    Final    PERIPHERAL VASCULAR CATHETERIZATION  Result Date: 03/29/2022 See surgical note for result.  US Venous Img Lower Bilateral (DVT)  Result Date: 03/29/2022 CLINICAL DATA:  CTA proven pulmonary emboli. Associated right heart strain. EXAM: BILATERAL LOWER EXTREMITY VENOUS DOPPLER ULTRASOUND TECHNIQUE: Gray-scale sonography with graded compression, as well as color Doppler and duplex ultrasound were performed to evaluate the lower extremity deep venous systems from the level of the common femoral vein and including the common femoral, femoral, profunda femoral, popliteal and calf veins including the posterior tibial, peroneal and gastrocnemius veins when visible. The superficial great saphenous vein was also interrogated. Spectral Doppler was utilized to evaluate flow at rest and with distal augmentation maneuvers in the common femoral, femoral and popliteal veins. COMPARISON:  None. FINDINGS: RIGHT LOWER EXTREMITY Common Femoral Vein: No evidence of thrombus. Normal  compressibility, respiratory phasicity and response to augmentation. Saphenofemoral Junction: No evidence of thrombus. Normal compressibility and flow on color Doppler imaging. Profunda Femoral Vein: No evidence of thrombus. Normal compressibility and flow on color Doppler imaging. Femoral Vein: No evidence of thrombus. Normal compressibility, respiratory phasicity and response to augmentation. Popliteal Vein: No evidence of thrombus. Normal compressibility, respiratory phasicity and response  to augmentation. Calf Veins: No evidence of thrombus. Normal compressibility and flow on color Doppler imaging. Superficial Great Saphenous Vein: No evidence of thrombus. Normal compressibility. Venous Reflux:  None. Other Findings:  None. LEFT LOWER EXTREMITY Common Femoral Vein: No evidence of thrombus. Normal compressibility, respiratory phasicity and response to augmentation. Saphenofemoral Junction: No evidence of thrombus. Normal compressibility and flow on color Doppler imaging. Profunda Femoral Vein: No evidence of thrombus. Normal compressibility and flow on color Doppler imaging. Femoral Vein: No evidence of thrombus. Normal compressibility, respiratory phasicity and response to augmentation. Popliteal Vein: No evidence of thrombus. Normal compressibility, respiratory phasicity and response to augmentation. Calf Veins: No evidence of thrombus. Normal compressibility and flow on color Doppler imaging. Superficial Great Saphenous Vein: No evidence of thrombus. Normal compressibility. Venous Reflux:  None. Other Findings:  None. IMPRESSION: No evidence of deep venous thrombosis in either lower extremity. Electronically Signed   By: Telford Nab M.D.   On: 03/29/2022 04:17    Assessment & Plan:   Problem List Items Addressed This Visit   None   I spent a total of   minutes with this patient in a face to face visit on the date of this encounter reviewing the last office visit with me on        ,  most recent  with patient's cardiologist in    ,  patient'ss diet and eating habits, home blood pressure readings ,  most recent imaging study ,   and post visit ordering of testing and therapeutics.    Follow-up: No follow-ups on file.   [name], MD

## 2022-07-12 NOTE — Patient Instructions (Signed)
Goal BP : if 130/80  ,

## 2022-07-13 ENCOUNTER — Encounter: Payer: Self-pay | Admitting: Internal Medicine

## 2022-07-13 DIAGNOSIS — T466X5A Adverse effect of antihyperlipidemic and antiarteriosclerotic drugs, initial encounter: Secondary | ICD-10-CM | POA: Insufficient documentation

## 2022-07-13 LAB — B12 AND FOLATE PANEL
Folate: 17.4 ng/mL (ref >5.9)
Vitamin B-12: 309 pg/mL (ref 211–911)

## 2022-07-13 NOTE — Assessment & Plan Note (Addendum)
Reviewed findings of prior CT scan today.Brian Atkins  Untreated due to statin myalgias

## 2022-07-13 NOTE — Assessment & Plan Note (Signed)
Urology evaluation is in process

## 2022-07-13 NOTE — Assessment & Plan Note (Signed)
Diagnosed by sleep study. he is wearing her CPAP every night a minimum of 6 hours per night and notes improved daytime wakefulness and decreased fatigue  

## 2022-07-13 NOTE — Assessment & Plan Note (Signed)
Rechecking level today with MMA and IF antibody

## 2022-07-13 NOTE — Assessment & Plan Note (Signed)
Symptoms are persistent , did not respond to doxycycline .  rifaxamin was prescribed but cost prohibitive

## 2022-07-14 ENCOUNTER — Telehealth: Payer: Self-pay | Admitting: Internal Medicine

## 2022-07-14 DIAGNOSIS — M25512 Pain in left shoulder: Secondary | ICD-10-CM

## 2022-07-14 DIAGNOSIS — M5030 Other cervical disc degeneration, unspecified cervical region: Secondary | ICD-10-CM

## 2022-07-14 DIAGNOSIS — M19012 Primary osteoarthritis, left shoulder: Secondary | ICD-10-CM

## 2022-07-14 DIAGNOSIS — M542 Cervicalgia: Secondary | ICD-10-CM

## 2022-07-14 NOTE — Telephone Encounter (Signed)
Referral has been pended for your approval.  

## 2022-07-14 NOTE — Telephone Encounter (Signed)
Patient called stating the [facility] is asking for a new referral.

## 2022-07-15 LAB — INTRINSIC FACTOR ANTIBODIES: Intrinsic Factor: NEGATIVE

## 2022-07-15 LAB — METHYLMALONIC ACID, SERUM: Methylmalonic Acid, Quant: 141 nmol/L (ref 87–318)

## 2022-07-26 ENCOUNTER — Ambulatory Visit (INDEPENDENT_AMBULATORY_CARE_PROVIDER_SITE_OTHER): Payer: Medicare Other | Admitting: Urology

## 2022-07-26 ENCOUNTER — Encounter: Payer: Self-pay | Admitting: Urology

## 2022-07-26 VITALS — BP 129/74 | HR 73 | Ht 74.0 in | Wt 200.0 lb

## 2022-07-26 DIAGNOSIS — N401 Enlarged prostate with lower urinary tract symptoms: Secondary | ICD-10-CM | POA: Diagnosis not present

## 2022-07-26 DIAGNOSIS — R3129 Other microscopic hematuria: Secondary | ICD-10-CM | POA: Diagnosis not present

## 2022-07-26 DIAGNOSIS — N400 Enlarged prostate without lower urinary tract symptoms: Secondary | ICD-10-CM

## 2022-07-26 NOTE — Progress Notes (Signed)
   07/26/22  CC:  Chief Complaint  Patient presents with   Cysto    HPI: Refer to prior office note 05/06/2022.  Repeat PSA back to baseline at 2.8.  Urinalysis at that visit did show microhematuria.  CT abdomen pelvis with contrast performed April 2023 showed small, bilateral nonobstructing renal calculi (largest 2 mm)  Blood pressure 129/74, pulse 73, height '6\' 2"'$  (1.88 m), weight 200 lb (90.7 kg). NED. A&Ox3.   No respiratory distress   Abd soft, NT, ND Normal phallus with bilateral descended testicles  Cystoscopy Procedure Note  Patient identification was confirmed, informed consent was obtained, and patient was prepped using Betadine solution.  Lidocaine jelly was administered per urethral meatus.     Pre-Procedure: - Inspection reveals a normal caliber ureteral meatus.  Procedure: The flexible cystoscope was introduced without difficulty - No urethral strictures/lesions are present. -  Prominent lateral lobe enlargement with hypervascularity/friability   -  bladder neck - Bilateral ureteral orifices identified - Bladder mucosa  reveals no ulcers, tumors, or lesions - No bladder stones - Mild trabeculation  Retroflexion shows small intravesical median lobe   Post-Procedure: - Patient tolerated the procedure well  Assessment/ Plan: BPH with hypervascularity which is the most likely source of his microhematuria Repeat PSA back to baseline Follow-up office visit 6 months with UA.  CTU for persistent microhematuria    [name], MD

## 2022-07-27 ENCOUNTER — Telehealth: Payer: Self-pay

## 2022-07-27 LAB — URINALYSIS, COMPLETE
Bilirubin, UA: NEGATIVE
Glucose, UA: NEGATIVE
Ketones, UA: NEGATIVE
Leukocytes,UA: NEGATIVE
Nitrite, UA: NEGATIVE
Protein,UA: NEGATIVE
Specific Gravity, UA: 1.02 (ref 1.005–1.030)
Urobilinogen, Ur: 0.2 mg/dL (ref 0.2–1.0)
pH, UA: 5 (ref 5.0–7.5)

## 2022-07-27 LAB — MICROSCOPIC EXAMINATION: Bacteria, UA: NONE SEEN

## 2022-07-27 NOTE — Telephone Encounter (Signed)
LMTCB. Need to scheduled pt for a surgical clearance before his surgery that is scheduled for 09/02/2022.

## 2022-07-30 ENCOUNTER — Other Ambulatory Visit: Payer: Medicare Other

## 2022-07-30 DIAGNOSIS — R3129 Other microscopic hematuria: Secondary | ICD-10-CM

## 2022-07-30 LAB — URINALYSIS, COMPLETE
Bilirubin, UA: NEGATIVE
Glucose, UA: NEGATIVE
Ketones, UA: NEGATIVE
Nitrite, UA: NEGATIVE
Protein,UA: NEGATIVE
Specific Gravity, UA: 1.02 (ref 1.005–1.030)
Urobilinogen, Ur: 0.2 mg/dL (ref 0.2–1.0)
pH, UA: 6.5 (ref 5.0–7.5)

## 2022-07-30 LAB — MICROSCOPIC EXAMINATION
RBC, Urine: >30 /hpf — AB (ref 0–2)
WBC, UA: >30 /hpf — AB (ref 0–5)

## 2022-07-30 MED ORDER — SULFAMETHOXAZOLE-TRIMETHOPRIM 800-160 MG PO TABS: 1.0000 | ORAL_TABLET | Freq: Two times a day (BID) | ORAL | 0 refills | Status: AC

## 2022-07-30 NOTE — Progress Notes (Signed)
Patient called in today complaining of dysuria after cystoscopy.  He was asked to bring in a UA which showed >30 WBC/>30 RBC/moderate bacteria.  Urine culture was ordered.  Rx Septra DS sent to pharmacy

## 2022-08-01 ENCOUNTER — Other Ambulatory Visit: Payer: Self-pay | Admitting: Urology

## 2022-08-02 NOTE — Telephone Encounter (Signed)
LMTCB

## 2022-08-03 ENCOUNTER — Telehealth: Payer: Self-pay | Admitting: Urology

## 2022-08-03 DIAGNOSIS — R3129 Other microscopic hematuria: Secondary | ICD-10-CM

## 2022-08-03 LAB — CULTURE, URINE COMPREHENSIVE

## 2022-08-03 NOTE — Telephone Encounter (Signed)
Notified patient as instructed, patient pleased. Discussed follow-up appointments, patient agrees  

## 2022-08-03 NOTE — Addendum Note (Signed)
Addended by: Chrystie Nose on: 08/03/2022 03:40 PM   Modules accepted: Orders

## 2022-08-03 NOTE — Telephone Encounter (Signed)
The bacteria growing in urine culture was not tested to the prescribed antibiotic.  Have his voiding symptoms improved?

## 2022-08-03 NOTE — Telephone Encounter (Signed)
Please schedule a lab visit for follow-up UA in 1-2 weeks

## 2022-08-03 NOTE — Telephone Encounter (Signed)
Patient states he is not having any symptoms at this time

## 2022-08-04 NOTE — Telephone Encounter (Signed)
Patient returned Adair Laundry, CMA's call.  I scheduled patient for a surgery clearance appointment on 08/24/2022.

## 2022-08-04 NOTE — Telephone Encounter (Signed)
noted 

## 2022-08-10 ENCOUNTER — Other Ambulatory Visit: Payer: Medicare Other

## 2022-08-10 DIAGNOSIS — R3129 Other microscopic hematuria: Secondary | ICD-10-CM

## 2022-08-11 LAB — MICROSCOPIC EXAMINATION: Bacteria, UA: NONE SEEN

## 2022-08-11 LAB — URINALYSIS, COMPLETE
Bilirubin, UA: NEGATIVE
Glucose, UA: NEGATIVE
Leukocytes,UA: NEGATIVE
Nitrite, UA: NEGATIVE
Protein,UA: NEGATIVE
Specific Gravity, UA: 1.02 (ref 1.005–1.030)
Urobilinogen, Ur: 0.2 mg/dL (ref 0.2–1.0)
pH, UA: 5.5 (ref 5.0–7.5)

## 2022-08-12 ENCOUNTER — Encounter: Payer: Self-pay | Admitting: *Deleted

## 2022-08-19 ENCOUNTER — Other Ambulatory Visit
Admission: RE | Admit: 2022-08-19 | Discharge: 2022-08-19 | Disposition: A | Discharge status: Home / Self Care | Payer: Medicare Other | Source: Ambulatory Visit | Attending: Internal Medicine | Admitting: Internal Medicine

## 2022-08-19 ENCOUNTER — Encounter: Payer: Self-pay | Admitting: Internal Medicine

## 2022-08-19 ENCOUNTER — Ambulatory Visit: Payer: Medicare Other | Attending: Internal Medicine | Admitting: Internal Medicine

## 2022-08-19 VITALS — BP 126/84 | HR 60 | Ht 74.0 in | Wt 209.0 lb

## 2022-08-19 DIAGNOSIS — R002 Palpitations: Secondary | ICD-10-CM | POA: Insufficient documentation

## 2022-08-19 DIAGNOSIS — I10 Essential (primary) hypertension: Secondary | ICD-10-CM | POA: Insufficient documentation

## 2022-08-19 DIAGNOSIS — R0602 Shortness of breath: Secondary | ICD-10-CM | POA: Insufficient documentation

## 2022-08-19 DIAGNOSIS — I200 Unstable angina: Secondary | ICD-10-CM | POA: Insufficient documentation

## 2022-08-19 DIAGNOSIS — R079 Chest pain, unspecified: Secondary | ICD-10-CM | POA: Diagnosis present

## 2022-08-19 DIAGNOSIS — Z86711 Personal history of pulmonary embolism: Secondary | ICD-10-CM | POA: Insufficient documentation

## 2022-08-19 DIAGNOSIS — E785 Hyperlipidemia, unspecified: Secondary | ICD-10-CM | POA: Diagnosis present

## 2022-08-19 LAB — COMPREHENSIVE METABOLIC PANEL
ALT: 14 U/L (ref 0–44)
AST: 15 U/L (ref 15–41)
Albumin: 3.9 g/dL (ref 3.5–5.0)
Alkaline Phosphatase: 58 U/L (ref 38–126)
Anion gap: 8 (ref 5–15)
BUN: 23 mg/dL (ref 8–23)
CO2: 26 mmol/L (ref 22–32)
Calcium: 9.7 mg/dL (ref 8.9–10.3)
Chloride: 108 mmol/L (ref 98–111)
Creatinine, Ser: 1.01 mg/dL (ref 0.61–1.24)
GFR, Estimated: >60 mL/min (ref >60)
Glucose, Bld: 129 mg/dL — ABNORMAL HIGH (ref 70–99)
Potassium: 3.8 mmol/L (ref 3.5–5.1)
Sodium: 142 mmol/L (ref 135–145)
Total Bilirubin: 0.6 mg/dL (ref 0.3–1.2)
Total Protein: 7.5 g/dL (ref 6.5–8.1)

## 2022-08-19 LAB — CBC
HCT: 46.3 % (ref 39.0–52.0)
Hemoglobin: 15.2 g/dL (ref 13.0–17.0)
MCH: 29.2 pg (ref 26.0–34.0)
MCHC: 32.8 g/dL (ref 30.0–36.0)
MCV: 89 fL (ref 80.0–100.0)
Platelets: 393 10*3/uL (ref 150–400)
RBC: 5.2 MIL/uL (ref 4.22–5.81)
RDW: 15.2 % (ref 11.5–15.5)
WBC: 11.9 10*3/uL — ABNORMAL HIGH (ref 4.0–10.5)
nRBC: 0 % (ref 0.0–0.2)

## 2022-08-19 LAB — MAGNESIUM: Magnesium: 2.3 mg/dL (ref 1.7–2.4)

## 2022-08-19 MED ORDER — ATORVASTATIN CALCIUM 20 MG PO TABS: 20.0000 mg | ORAL_TABLET | Freq: Every day | ORAL | 3 refills | Status: DC

## 2022-08-19 NOTE — Progress Notes (Signed)
Follow-up Outpatient Visit Date: 08/19/2022  Primary Care Provider: [name], MD [place] [address]  Chief Complaint: Palpitations  HPI:  Brian Atkins is a 66 y.o. 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  He was last seen in our office in 04/2021 by [name], PA, at which time he reported stable exertional dyspnea being followed by pulmonology.  He also endorsed occasional dizziness and palpitations.  Prior event monitor in 2021 showed no significant arrhythmias.  No medication changes or additional testing were pursued.  He was previously on rosuvastatin though it appears this was discontinued due to myalgias.  He has also stopped taking ezetimibe.  Most recent lipid panel in April showed poor lipid control with total cholesterol of 253, HDL 33, triglycerides 225, and LDL 188.  Today, Brian Atkins notes that he has largely recovered from his bowel obstruction with emergent surgery and PE this spring.  However, he feels more short of breath than before this.  He also reports intermittent tightness in the chest especially when he is exerting himself.  At the time of his PE, he was not feeling this but instead had posterior chest pain with deep inspiration.  He has been compliant with his medications including anticoagulation.  He also notes more frequent palpitations over the last few days.  He feels like his heart flutters for 4-5 seconds and then stops.  There is associated chest discomfort and lightheadedness.  Episodes happen several times a day, sometimes coming on every 5 to 10 minutes.  There are no obvious  precipitants.  He is also more aware of his heartbeat when lying on his left side.  He is scheduled for left shoulder surgery next month.  --------------------------------------------------------------------------------------------------  Past Medical History:  Diagnosis Date   Complication of anesthesia    slow to wake   Coronary artery disease, non-occlusive    a. LHC 12/18: ostLAD 20%, p/mLAD 60% FFR 0.84, ost ramus 50%, mRCA 50% FFR 0.94, EF 65%   COVID-19 virus infection 12/28/2019   Diverticulitis    Diverticulosis    Dyslipidemia    Dyspnea    Family history of adverse reaction to anesthesia    Mother - slow to wake   GERD (gastroesophageal reflux disease)    Hemorrhoids    Hiatal hernia    History of 2019 novel coronavirus disease (COVID-19) 12/24/2019   History of echocardiogram    a. TTE 1/19: EF of 60-65%, normal wall motion, normal LV diastolic function, mildly dilated LA   History of kidney stones    Labile hypertension    Lymphoma (HCC)    Medial epicondylitis of right elbow    Meralgia paresthetica of right side    Microscopic hematuria    Ocular migraine    Pneumonia    Sleep apnea    CPAP   Past Surgical History:  Procedure Laterality Date   BRONCHIAL WASHINGS N/A 08/21/2021   Procedure: BRONCHIAL WASHINGS;  Surgeon: Ottie Glazier, MD;  Location: [facility];  Service: Thoracic;  Laterality: N/A;   COLONOSCOPY     FLEXIBLE BRONCHOSCOPY N/A 08/21/2021   Procedure: FLEXIBLE BRONCHOSCOPY;  Surgeon: Ottie Glazier, MD;  Location: [facility];  Service: Thoracic;  Laterality:  N/A;   INTRAVASCULAR PRESSURE WIRE/FFR STUDY N/A 12/07/2017   Procedure: INTRAVASCULAR PRESSURE WIRE/FFR STUDY;  Surgeon: Nelva Bush, MD;  Location: [facility];  Service: Cardiovascular;  Laterality: N/A;   LEFT HEART CATH AND CORONARY ANGIOGRAPHY Left 12/07/2017   Procedure: LEFT HEART CATH AND CORONARY ANGIOGRAPHY;  Surgeon: Nelva Bush, MD;  Location: [facility];   Service: Cardiovascular;  Laterality: Left;   nissen funduplication  [id number]   [name]   PULMONARY THROMBECTOMY N/A 03/29/2022   Procedure: PULMONARY THROMBECTOMY;  Surgeon: Algernon Huxley, MD;  Location: [facility];  Service: Cardiovascular;  Laterality: N/A;   SEPTOPLASTY Bilateral 08/23/2019   Procedure: SEPTOPLASTY;  Surgeon: [name], MD;  Location: [facility];  Service: ENT;  Laterality: Bilateral;   SPLENECTOMY     TURBINATE REDUCTION Bilateral 08/23/2019   Procedure: TURBINATE REDUCTION;  Surgeon: [name], MD;  Location: [facility];  Service: ENT;  Laterality: Bilateral;  Latex sensitivity sleep apnea     Current Meds  Medication Sig   carvedilol (COREG) 12.5 MG tablet Take 1 tablet (12.5 mg total) by mouth 2 (two) times daily.   ELIQUIS 5 MG TABS tablet Take 5 mg by mouth 2 (two) times daily.   famotidine (PEPCID) 20 MG tablet Take 20 mg by mouth 2 (two) times daily.   hydrochlorothiazide (MICROZIDE) 12.5 MG capsule TAKE ONE CAPSULE BY MOUTH ONE TIME DAILY   losartan (COZAAR) 100 MG tablet TAKE ONE TABLET BY MOUTH ONE TIME DAILY   Multiple Vitamins-Minerals (MULTIVITAMIN WITH MINERALS) tablet Take 1 tablet by mouth daily.   mupirocin ointment (BACTROBAN) 2 % Apply 1 application. topically 2 (two) times daily. In nose x 5 days   nitroGLYCERIN (NITROSTAT) 0.4 MG SL tablet Place 1 tablet (0.4 mg total) under the tongue every 5 (five) minutes as needed for chest pain. Maximum of 3 doses.   omeprazole (PRILOSEC) 40 MG capsule Take 40 mg by mouth daily.   ondansetron (ZOFRAN) 4 MG tablet Take 1 tablet by mouth every 8 (eight) hours as needed.   ONETOUCH VERIO test strip USE TO CHECK BLOOD SUGAR AS NEEDED   Prucalopride Succinate (MOTEGRITY) 2 MG TABS Take 2 mg by mouth daily as needed (constipation).   rOPINIRole (REQUIP) 0.25 MG tablet Take 0.25 mg by mouth in the morning and at bedtime.    tiZANidine (ZANAFLEX) 4 MG tablet TAKE ONE-HALF TO ONE  TABLET BY MOUTH AT BEDTIME AS NEEDED FOR MUSCLE SPASM   triamcinolone (NASACORT) 55 MCG/ACT AERO nasal inhaler Place 2 sprays into the nose at bedtime.   zolpidem (AMBIEN CR) 12.5 MG CR tablet TAKE ONE TABLET BY MOUTH AT BEDTIME AS NEEDED FOR SLEEP    Allergies: Avelox [moxifloxacin], Gadolinium derivatives, Contrast media [iodinated contrast media], Crestor [rosuvastatin calcium], Morphine and related, Niacin, Niacin-simvastatin er, Latex, Tape, Tapentadol, and Wound dressing adhesive  Social History   Tobacco Use   Smoking status: Never    Passive exposure: Past   Smokeless tobacco: Never  Vaping Use   Vaping Use: Never used  Substance Use Topics   Alcohol use: No   Drug use: No    Family History  Problem Relation Age of Onset   Coronary artery disease Father 68       CABG   Aortic aneurysm Father 16       repaired   Hyperlipidemia Father    Lung cancer Mother    Hypertension Mother    Cancer Mother  bladder   COPD Mother    Stomach cancer Maternal Grandfather     Review of Systems: A 12-system review of systems was performed and was negative except as noted in the HPI.  --------------------------------------------------------------------------------------------------  Physical Exam: BP 126/84 (BP Location: Left Arm, Patient Position: Sitting, Cuff Size: Normal)   Pulse 60   Ht _0  (1.88 m)   Wt 209 lb (94.8 kg)   SpO2 96%   BMI 26.83 kg/m   General:  NAD. Neck: No JVD or HJR. Lungs: Clear to auscultation bilaterally without wheezes or crackles. Heart: Regular rate and rhythm without murmurs, rubs, or gallops. Abdomen: Soft, nontender, nondistended. Extremities: No lower extremity edema.  EKG: Normal sinus rhythm with borderline LVH.  Lab Results  Component Value Date   WBC 9.3 04/07/2022   HGB 13.8 04/07/2022   HCT 41.2 04/07/2022   MCV 91.7 04/07/2022   PLT 596.0 (H) 04/07/2022    Lab Results  Component Value Date   NA 139 03/30/2022    K 4.0 03/30/2022   CL 105 03/30/2022   CO2 25 03/30/2022   BUN 15 03/30/2022   CREATININE 0.89 03/30/2022   GLUCOSE 163 (H) 03/30/2022   ALT 13 03/29/2022    Lab Results  Component Value Date   CHOL 253 (H) 04/07/2022   HDL 33.00 (L) 04/07/2022   LDLCALC 77 07/22/2021   LDLDIRECT 188.0 04/07/2022   TRIG 225.0 (H) 04/07/2022   CHOLHDL 8 04/07/2022    --------------------------------------------------------------------------------------------------  ASSESSMENT AND PLAN: Coronary artery disease with accelerating angina: [name] reports increased exertional dyspnea as well as chest tightness over the last few months.  While some of this could be related to his PE earlier this year, I am also concerned that there could be progression of his previously moderate CAD.  His EKG today does not show any acute ischemic changes.  I have recommended that we obtain an echocardiogram and pharmacologic myocardial perfusion stress test, hopefully before he proceeds with elective shoulder surgery.  We will defer medication changes at this time.  He is on apixaban and lieu of aspirin given his recent PE.  Shortness of breath and pulmonary embolism: Mr. Mcphillips suffered a provoked PE following emergent abdominal surgery due to bowel obstruction this spring.  He required pulmonary thrombectomy by vascular surgery and remains on apixaban 5 mg twice daily.  He should complete at least 6 months of anticoagulation.  In light of his increased exertional dyspnea, I think it would be prudent to repeat an echocardiogram to ensure that he is not developing right heart failure or pulmonary hypertension.  Hypertension: Blood pressure well controlled today.  Continue current regimen of carvedilol, hydrochlorothiazide, and losartan.  Palpitations: Palpitations have been more frequent over the last few days.  Event monitor in 2021 did not show any significant arrhythmias.  Right heart strain could be a nidus for atrial  arrhythmias.  We will begin with aforementioned echo and stress test.  If palpitations persist, repeat ambulatory event monitor will need to be considered.  Hyperlipidemia: Mr. Mendonca was previously on rosuvastatin and ezetimibe, both of which have been held.  He thinks that some of these medication changes were made around the time of his hospitalizations this spring.  He notes some myalgias with rosuvastatin.  We have agreed to rechallenge him with atorvastatin 20 mg daily.  Lipid panel and LFTs will need to be repeated in 3 months to assess response.  If he is intolerant of atorvastatin, we will need to  consider a PCSK9 inhibitor or bempedoic acid in the future.  Follow-up: Return to clinic in 6 weeks.  Nelva Bush, MD 08/19/2022 8:45 AM

## 2022-08-19 NOTE — Patient Instructions (Signed)
Medication Instructions:   Your physician has recommended you make the following change in your medication:   START Atorvastatin 20 mg daily   *If you need a refill on your cardiac medications before your next appointment, please call your pharmacy*   Lab Work:  Today at the medical mall: CMET, CBC, Magnesium   -  Please go to the [facility] [place] in at the Registration Desk: 1st desk to the right, past the screening table   If you have labs (blood work) drawn today and your tests are completely normal, you will receive your results only by: [place] (if you have MyChart) OR A paper copy in the mail If you have any lab test that is abnormal or we need to change your treatment, we will call you to review the results.   Testing/Procedures:  1) Your physician has requested that you have an echocardiogram (IN NEXT 1-2 WEEKS). Echocardiography is a painless test that uses sound waves to create images of your heart. It provides your doctor with information about the size and shape of your heart and how well your heart's chambers and valves are working. This procedure takes approximately one hour. There are no restrictions for this procedure.  2) [place]  Your caregiver has ordered a Stress Test with nuclear imaging (Lexiscan Myoview). The purpose of this test is to evaluate the blood supply to your heart muscle. This procedure is referred to as a "Non-Invasive Stress Test." This is because other than having an IV started in your vein, nothing is inserted or "invades" your body. Cardiac stress tests are done to find areas of poor blood flow to the heart by determining the extent of coronary artery disease (CAD). Some patients exercise on a treadmill, which naturally increases the blood flow to your heart, while others who are  unable to walk on a treadmill due to physical limitations have a pharmacologic/chemical stress agent called Lexiscan . This medicine  will mimic walking on a treadmill by temporarily increasing your coronary blood flow.   Please note: these test may take anywhere between 2-4 hours to complete  PLEASE REPORT TO [place] [place] AT THE FIRST DESK WILL DIRECT YOU WHERE TO GO  Date of Procedure:_____________________________________  Arrival Time for Procedure:______________________________   PLEASE NOTIFY THE OFFICE AT LEAST 24 HOURS IN ADVANCE IF YOU ARE UNABLE TO KEEP YOUR APPOINTMENT.  [phone] AND  PLEASE NOTIFY NUCLEAR MEDICINE AT [facility] AT LEAST 24 HOURS IN ADVANCE IF YOU ARE UNABLE TO KEEP YOUR APPOINTMENT. [phone]  How to prepare for your Myoview test:  Do not eat or drink after midnight No caffeine for 24 hours prior to test No smoking 24 hours prior to test. Your medication may be taken with water.  If your doctor stopped a medication because of this test, do not take that medication. Ladies, please do not wear dresses.  Skirts or pants are appropriate. Please wear a short sleeve shirt. No perfume, cologne or lotion. Wear comfortable walking shoes. No heels!   Follow-Up: At [facility], you and your health needs are our priority.  As part of our continuing mission to provide you with exceptional heart care, we have created designated Provider Care Teams.  These Care Teams include your primary Cardiologist (physician) and Advanced Practice Providers (APPs -  Physician Assistants and Nurse Practitioners) who all work together to provide you with the care you need, when you need it.  We recommend signing up for the patient portal called "MyChart".  Sign up information is provided on this After Visit Summary.  MyChart is used to connect with patients for Virtual Visits (Telemedicine).  Patients are able to view lab/test results, encounter notes, upcoming appointments, etc.  Non-urgent messages can be sent to your provider as well.   To learn more about what you can do with  MyChart, go to [email or web address].    Your next appointment:   6 week(s)  The format for your next appointment:   In Person  Provider:   You may see Nelva Bush, MD or one of the following Advanced Practice Providers on your designated Care Team:   Murray Hodgkins, NP [name], PA-C Cadence Kathlen Mody, PA-C Gerrie Nordmann, NP    Important Information About Sugar

## 2022-08-20 ENCOUNTER — Telehealth: Payer: Self-pay

## 2022-08-20 NOTE — Telephone Encounter (Signed)
Left message for pt to call back  °

## 2022-08-20 NOTE — Telephone Encounter (Signed)
-----   Message from Nelva Bush, MD sent at 08/20/2022  6:43 AM EDT ----- Please let Brian Atkins know that his labs overall look good with normal kidney function, liver function, and electrolytes.  His white blood cell count is slightly elevated, which is nonspecific.  I recommend that we move forward with the stress test and echocardiogram, as discussed at yesterday's visit.  CBC should be repeated in 1 to 2 months to ensure that his leukocytosis has resolved.  If he develops any symptoms of an infection such as fevers, chills, or worsening pain, he should see his PCP right away.

## 2022-08-24 ENCOUNTER — Ambulatory Visit: Payer: Medicare Other | Attending: Internal Medicine

## 2022-08-24 ENCOUNTER — Other Ambulatory Visit: Payer: Self-pay | Admitting: Internal Medicine

## 2022-08-24 ENCOUNTER — Ambulatory Visit (INDEPENDENT_AMBULATORY_CARE_PROVIDER_SITE_OTHER): Payer: Medicare Other | Admitting: Internal Medicine

## 2022-08-24 ENCOUNTER — Encounter: Payer: Self-pay | Admitting: Internal Medicine

## 2022-08-24 DIAGNOSIS — M19012 Primary osteoarthritis, left shoulder: Secondary | ICD-10-CM | POA: Diagnosis not present

## 2022-08-24 DIAGNOSIS — I200 Unstable angina: Secondary | ICD-10-CM

## 2022-08-24 DIAGNOSIS — I25119 Atherosclerotic heart disease of native coronary artery with unspecified angina pectoris: Secondary | ICD-10-CM

## 2022-08-24 DIAGNOSIS — R0602 Shortness of breath: Secondary | ICD-10-CM | POA: Diagnosis present

## 2022-08-24 DIAGNOSIS — Z86711 Personal history of pulmonary embolism: Secondary | ICD-10-CM

## 2022-08-24 MED ORDER — ESZOPICLONE 3 MG PO TABS: 3.0000 mg | ORAL_TABLET | Freq: Every day | ORAL | 5 refills | Status: DC

## 2022-08-24 MED ORDER — ENOXAPARIN SODIUM 100 MG/ML IJ SOSY
100.0000 mg | PREFILLED_SYRINGE | Freq: Two times a day (BID) | INTRAMUSCULAR | 0 refills | Status: DC
Start: 2022-08-24 — End: 2023-02-14

## 2022-08-24 NOTE — Patient Instructions (Addendum)
I recommend postponing the shoulder surgery for 6 months ,  I want you make sure your heart is ok and your PE has resolved   For your headaches,   max out the tylenol.  You can add up to 2000 mg of acetominophen (tylenol) every day safely  In divided doses (500 mg every 6 hours  Or 1000 mg every 12 hours.)   If you want to schedule an epidural steroid injection in your spine ,  we will suspend your eliquis 5 days prior (or more) and use lovenox for a few days prior to the procedure, just let me know     You should take your tizanidine one hour before your sleeping pill

## 2022-08-24 NOTE — Assessment & Plan Note (Signed)
Occurred in April 2023, his second DVT/PE ,  Managed with eliquis lifelong  .  Will need to suspend 5 days prior to [facility] and bridge with lovenox

## 2022-08-24 NOTE — Assessment & Plan Note (Signed)
I have recommended postponing elective surgery until his cardiac condition has been fully evaluated and his PE can be confirmed as resolved.

## 2022-08-24 NOTE — Assessment & Plan Note (Signed)
Workup underway for progression of disease suggested by his symptoms

## 2022-08-24 NOTE — Progress Notes (Signed)
Subjective:  Patient ID: Brian Atkins, male    DOB: May 05, 1956  Age: 66 y.o. MRN: [id number]  CC: Diagnoses of Coronary artery disease involving native heart with angina pectoris, unspecified vessel or lesion type ([place]), History of pulmonary embolism, and Arthritis of left shoulder region were pertinent to this visit.   HPI Brian Atkins presents for medical clearance for left shoulder repair Chief Complaint  Patient presents with   Pre-op Exam    Pre-op exam for left shoulder repair   Brian Atkins is a 66 yr old male with extranodal B cell lymphoma,  aortic atherosclerosis , nonobstructive coronary artery disease by catheterization in 2018 and cardiac CTA in 2020, small bowel obstruction requiring emergent laparotomy (06/8675) complicated by subsequent PE requiring thrombectomy (03/2022),  osteoarthritis of the left shoulder who is scheduled  for elective surgery on Sept 14 with Dr Brian Atkins  However he has recently been having exertional dyspnea /angina equivalent and is under workup for progression of CAD   His shoulder pain is improving with PT and he is not requiring use of medications stronger than tylenol.  He is bothered more by the pain in his neck and his daily headache,  secondary to cervical disk disease.  He has been offered an ESI but has to suspend eliquis to do so.  He has not had a repeat CT angio of chest to confirm resolution of bilateral PE's in April  .     Outpatient Medications Prior to Visit  Medication Sig Dispense Refill   atorvastatin (LIPITOR) 20 MG tablet Take 1 tablet (20 mg total) by mouth daily. 30 tablet 3   carvedilol (COREG) 12.5 MG tablet Take 1 tablet (12.5 mg total) by mouth 2 (two) times daily. 180 tablet 0   cyanocobalamin (,VITAMIN B-12,) 1000 MCG/ML injection INJECT 1ML INTO THE MUSCLE EVERY 30 DAYS 10 mL 0   ELIQUIS 5 MG TABS tablet Take 5 mg by mouth 2 (two) times daily.     famotidine (PEPCID) 20 MG tablet Take 20 mg by mouth 2 (two) times daily.      hydrochlorothiazide (MICROZIDE) 12.5 MG capsule TAKE ONE CAPSULE BY MOUTH ONE TIME DAILY 90 capsule 0   losartan (COZAAR) 100 MG tablet TAKE ONE TABLET BY MOUTH ONE TIME DAILY 90 tablet 2   Multiple Vitamins-Minerals (MULTIVITAMIN WITH MINERALS) tablet Take 1 tablet by mouth daily.     mupirocin ointment (BACTROBAN) 2 % Apply 1 application. topically 2 (two) times daily. In nose x 5 days 30 g 0   nitroGLYCERIN (NITROSTAT) 0.4 MG SL tablet Place 1 tablet (0.4 mg total) under the tongue every 5 (five) minutes as needed for chest pain. Maximum of 3 doses. 35 tablet 3   omeprazole (PRILOSEC) 40 MG capsule Take 40 mg by mouth daily.     ondansetron (ZOFRAN) 4 MG tablet Take 1 tablet by mouth every 8 (eight) hours as needed.     ONETOUCH VERIO test strip USE TO CHECK BLOOD SUGAR AS NEEDED 100 strip 2   Prucalopride Succinate (MOTEGRITY) 2 MG TABS Take 2 mg by mouth daily as needed (constipation).     rOPINIRole (REQUIP) 0.25 MG tablet Take 0.25 mg by mouth in the morning and at bedtime.      Syringe/Needle, Disp, (SYRINGE 3CC/25GX1") 25G X 1" 3 ML MISC Use for b12 injections 50 each 0   tiZANidine (ZANAFLEX) 4 MG tablet TAKE ONE-HALF TO ONE TABLET BY MOUTH AT BEDTIME AS NEEDED FOR MUSCLE SPASM 30 tablet  2   triamcinolone (NASACORT) 55 MCG/ACT AERO nasal inhaler Place 2 sprays into the nose at bedtime.     zolpidem (AMBIEN CR) 12.5 MG CR tablet TAKE ONE TABLET BY MOUTH AT BEDTIME AS NEEDED FOR SLEEP 30 tablet 5   No facility-administered medications prior to visit.    Review of Systems;  Patient denies headache, fevers, malaise, unintentional weight loss, skin rash, eye pain, sinus congestion and sinus pain, sore throat, dysphagia,  hemoptysis , cough, dyspnea, wheezing, chest pain, palpitations, orthopnea, edema, abdominal pain, nausea, melena, diarrhea, constipation, flank pain, dysuria, hematuria, urinary  Frequency, nocturia, numbness, tingling, seizures,  Focal weakness, Loss of consciousness,   Tremor, insomnia, depression, anxiety, and suicidal ideation.      Objective:  BP 128/74 (BP Location: Left Arm, Patient Position: Sitting, Cuff Size: Normal)   Pulse 78   Temp (!) 97.5 F (36.4 C) (Oral)   Ht '6\' 2"'$  (1.88 m)   Wt 206 lb (93.4 kg)   SpO2 97%   BMI 26.45 kg/m   BP Readings from Last 3 Encounters:  08/24/22 128/74  08/19/22 126/84  07/26/22 129/74    Wt Readings from Last 3 Encounters:  08/24/22 206 lb (93.4 kg)  08/19/22 209 lb (94.8 kg)  07/26/22 200 lb (90.7 kg)    General appearance: alert, cooperative and appears stated age Ears: normal TM's and external ear canals both ears Throat: lips, mucosa, and tongue normal; teeth and gums normal Neck: no adenopathy, no carotid bruit, supple, symmetrical, trachea midline and thyroid not enlarged, symmetric, no tenderness/mass/nodules Back: symmetric, no curvature. ROM normal. No CVA tenderness. Lungs: clear to auscultation bilaterally Heart: regular rate and rhythm, S1, S2 normal, no murmur, click, rub or gallop Abdomen: soft, non-tender; bowel sounds normal; no masses,  no organomegaly Pulses: 2+ and symmetric Skin: Skin color, texture, turgor normal. No rashes or lesions Lymph nodes: Cervical, supraclavicular, and axillary nodes normal.  Lab Results  Component Value Date   HGBA1C 6.0 04/07/2022   HGBA1C 6.0 09/23/2020   HGBA1C 5.8 03/10/2020    Lab Results  Component Value Date   CREATININE 1.01 08/19/2022   CREATININE 0.89 03/30/2022   CREATININE 0.96 03/29/2022    Lab Results  Component Value Date   WBC 11.9 (H) 08/19/2022   HGB 15.2 08/19/2022   HCT 46.3 08/19/2022   PLT 393 08/19/2022   GLUCOSE 129 (H) 08/19/2022   CHOL 253 (H) 04/07/2022   TRIG 225.0 (H) 04/07/2022   HDL 33.00 (L) 04/07/2022   LDLDIRECT 188.0 04/07/2022   LDLCALC 77 07/22/2021   ALT 14 08/19/2022   AST 15 08/19/2022   NA 142 08/19/2022   K 3.8 08/19/2022   CL 108 08/19/2022   CREATININE 1.01 08/19/2022   BUN 23  08/19/2022   CO2 26 08/19/2022   TSH 1.46 04/07/2022   PSA 4.76 (H) 04/07/2022   INR 1.0 03/28/2022   HGBA1C 6.0 04/07/2022   MICROALBUR 0.5 10/24/2020    No results found.  Assessment & Plan:   Problem List Items Addressed This Visit     Arthritis of left shoulder region    I have recommended postponing elective surgery until his cardiac condition has been fully evaluated and his PE can be confirmed as resolved.       Coronary artery disease with angina pectoris ([place])    Workup underway for progression of disease suggested by his symptoms       Relevant Medications   enoxaparin (LOVENOX) 100 MG/ML injection  History of pulmonary embolism    Occurred in April 2023, his second DVT/PE ,  Managed with eliquis lifelong  .  Will need to suspend 5 days prior to [facility] and bridge with lovenox       I spent a total of 38 minutes with this patient in a face to face visit on the date of this encounter reviewing the last office visit with his orthopeist,  cardiologist,   and oncologist , most recent imaging study ,   and post visit ordering of testing and therapeutics.    Follow-up: No follow-ups on file.   [name], MD

## 2022-08-25 ENCOUNTER — Encounter
Admission: RE | Admit: 2022-08-25 | Discharge: 2022-08-25 | Disposition: A | Discharge status: Home / Self Care | Payer: Medicare Other | Source: Ambulatory Visit | Attending: Internal Medicine | Admitting: Internal Medicine

## 2022-08-25 DIAGNOSIS — I200 Unstable angina: Secondary | ICD-10-CM | POA: Insufficient documentation

## 2022-08-25 DIAGNOSIS — R0602 Shortness of breath: Secondary | ICD-10-CM | POA: Insufficient documentation

## 2022-08-25 LAB — NM MYOCAR MULTI W/SPECT W/WALL MOTION / EF
Base ST Depression (mm): 0 mm
LV dias vol: 64 mL (ref 62–150)
LV sys vol: 17 mL
Nuc Stress EF: 73 %
Peak HR: 84 {beats}/min
Percent HR: 54 %
Rest HR: 59 {beats}/min
Rest Nuclear Isotope Dose: 10.4 mCi
SDS: 0
SRS: 0
SSS: 0
ST Depression (mm): 0 mm
Stress Nuclear Isotope Dose: 30.5 mCi
TID: 0.74

## 2022-08-25 LAB — ECHOCARDIOGRAM COMPLETE
AR max vel: 3.6 cm2
AV Area VTI: 4.13 cm2
AV Area mean vel: 3.52 cm2
AV Mean grad: 4 mmHg
AV Peak grad: 7.1 mmHg
Ao pk vel: 1.33 m/s
Area-P 1/2: 3.24 cm2
S' Lateral: 2.4 cm

## 2022-08-25 MED ORDER — TECHNETIUM TC 99M TETROFOSMIN IV KIT
10.0000 | PACK | Freq: Once | INTRAVENOUS | Status: AC
  Administered 2022-08-25: 10.42 via INTRAVENOUS

## 2022-08-25 MED ORDER — TECHNETIUM TC 99M TETROFOSMIN IV KIT
30.5000 | PACK | Freq: Once | INTRAVENOUS | Status: AC | PRN
  Administered 2022-08-25: 30.5 via INTRAVENOUS

## 2022-08-25 MED ORDER — REGADENOSON 0.4 MG/5ML IV SOLN
0.4000 mg | Freq: Once | INTRAVENOUS | Status: AC
  Administered 2022-08-25: 0.4 mg via INTRAVENOUS

## 2022-08-30 ENCOUNTER — Telehealth: Payer: Self-pay | Admitting: Internal Medicine

## 2022-08-30 NOTE — Telephone Encounter (Signed)
Brian Atkins called stating she faxed a clearance form for elquis to the office but has not received it back Fax [phone]

## 2022-08-31 NOTE — Telephone Encounter (Signed)
Melanie called from [facility] to state surgery hasn't been scheduled.  Threasa Beards states they have to receive the completed surgical clearance form before they can schedule.  I spoke with Adair Laundry, CMA, and transferred call to her.

## 2022-08-31 NOTE — Telephone Encounter (Signed)
Surgery has been canceled. Left message to let Brian Atkins know.

## 2022-09-01 NOTE — Telephone Encounter (Signed)
Waiting on the clearance form

## 2022-09-02 ENCOUNTER — Ambulatory Visit: Admission: RE | Admit: 2022-09-02 | Payer: Medicare Other | Source: Home / Self Care | Admitting: Orthopedic Surgery

## 2022-09-02 ENCOUNTER — Encounter: Admission: RE | Payer: Self-pay | Source: Home / Self Care

## 2022-09-02 SURGERY — SHOULDER ARTHROSCOPY WITH OPEN ROTATOR CUFF REPAIR AND DISTAL CLAVICLE ACROMINECTOMY
Anesthesia: Choice | Laterality: Left

## 2022-09-03 NOTE — Telephone Encounter (Signed)
Melanie from [facility] said that this clearance is not for the actual surgery, she stated that it is for the injections they are wanting to do.

## 2022-09-03 NOTE — Telephone Encounter (Signed)
Finally received and placed in red folder.

## 2022-09-06 NOTE — Telephone Encounter (Signed)
LMTCB with Brian Atkins. Need to have her refax the clearance form.

## 2022-09-09 NOTE — Telephone Encounter (Signed)
Form placed in red folder  

## 2022-09-10 NOTE — Telephone Encounter (Signed)
Faxed

## 2022-09-27 ENCOUNTER — Telehealth: Payer: Self-pay

## 2022-09-27 ENCOUNTER — Ambulatory Visit: Payer: Medicare Other

## 2022-09-27 NOTE — Telephone Encounter (Signed)
No answer when called for scheduled AWV. Left message to reschedule.  ?

## 2022-09-30 ENCOUNTER — Other Ambulatory Visit: Payer: Self-pay | Admitting: Internal Medicine

## 2022-09-30 DIAGNOSIS — M542 Cervicalgia: Secondary | ICD-10-CM

## 2022-09-30 DIAGNOSIS — M5030 Other cervical disc degeneration, unspecified cervical region: Secondary | ICD-10-CM

## 2022-09-30 DIAGNOSIS — M25512 Pain in left shoulder: Secondary | ICD-10-CM

## 2022-09-30 DIAGNOSIS — R519 Headache, unspecified: Secondary | ICD-10-CM

## 2022-09-30 DIAGNOSIS — M19012 Primary osteoarthritis, left shoulder: Secondary | ICD-10-CM

## 2022-09-30 NOTE — Telephone Encounter (Signed)
Refilled: 07/02/2022 Last OV: 08/24/2022 Next OV: not scheduled

## 2022-10-06 ENCOUNTER — Ambulatory Visit: Payer: Medicare Other | Attending: Internal Medicine | Admitting: Internal Medicine

## 2022-10-06 NOTE — Progress Notes (Deleted)
Follow-up Outpatient Visit Date: 10/06/2022  Primary Care Provider: [name], MD [address] [place] [address]  Chief Complaint: ***  HPI:  [name] is a 66 y.o. 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  I last saw him in late August, at which time he reported feeling more short of breath.  He also described intermittent chest pain with deep inspiration.  We agreed to obtain an echocardiogram and myocardial perfusion stress test.  Both tests were reassuring.  --------------------------------------------------------------------------------------------------  Past Medical History:  Diagnosis Date   Complication of anesthesia    slow to wake   Coronary Atkins disease, non-occlusive    a. LHC 12/18: ostLAD 20%, p/mLAD 60% FFR 0.84, ost ramus 50%, mRCA 50% FFR 0.94, EF 65%   COVID-19 virus infection 12/28/2019   Diverticulitis    Diverticulosis    Dyslipidemia    Dyspnea    Family history of adverse reaction to anesthesia    Mother - slow to wake   GERD (gastroesophageal reflux disease)    Hemorrhoids    Hiatal hernia    History of 2019 novel coronavirus disease (COVID-19) 12/24/2019   History of echocardiogram    a. TTE 1/19: EF of 60-65%, normal wall motion, normal LV diastolic function, mildly dilated LA   History of kidney stones    Labile hypertension    Lymphoma (HCC)    Medial epicondylitis of right elbow    Meralgia paresthetica of right side    Microscopic hematuria    Ocular migraine    Pneumonia    Sleep apnea    CPAP   Past Surgical History:  Procedure Laterality Date   BRONCHIAL  WASHINGS N/A 08/21/2021   Procedure: BRONCHIAL WASHINGS;  Surgeon: Ottie Glazier, MD;  Location: [facility];  Service: Thoracic;  Laterality: N/A;   COLONOSCOPY     FLEXIBLE BRONCHOSCOPY N/A 08/21/2021   Procedure: FLEXIBLE BRONCHOSCOPY;  Surgeon: Ottie Glazier, MD;  Location: [facility];  Service: Thoracic;  Laterality: N/A;   INTRAVASCULAR PRESSURE WIRE/FFR STUDY N/A 12/07/2017   Procedure: INTRAVASCULAR PRESSURE WIRE/FFR STUDY;  Surgeon: Nelva Bush, MD;  Location: [place] [facility];  Service: Cardiovascular;  Laterality: N/A;   LEFT HEART CATH AND CORONARY ANGIOGRAPHY Left 12/07/2017   Procedure: LEFT HEART CATH AND CORONARY ANGIOGRAPHY;  Surgeon: Nelva Bush, MD;  Location: [place] [facility];  Service: Cardiovascular;  Laterality: Left;   nissen funduplication  1751   Brian Atkins   PULMONARY THROMBECTOMY N/A 03/29/2022   Procedure: PULMONARY THROMBECTOMY;  Surgeon: Algernon Huxley, MD;  Location: [place] [facility];  Service: Cardiovascular;  Laterality: N/A;   SEPTOPLASTY Bilateral 08/23/2019   Procedure: SEPTOPLASTY;  Surgeon: [name], MD;  Location: [place];  Service: ENT;  Laterality: Bilateral;   SPLENECTOMY     TURBINATE REDUCTION Bilateral 08/23/2019   Procedure: TURBINATE REDUCTION;  Surgeon: [name], MD;  Location: [place];  Service: ENT;  Laterality: Bilateral;  Latex sensitivity sleep apnea    No outpatient medications have been marked as taking for the 10/06/22 encounter (Appointment) with Brian Atkins, Brian Gave, MD.    Allergies: Avelox [moxifloxacin], Gadolinium derivatives, Contrast media [iodinated contrast media], Crestor [rosuvastatin calcium], Morphine and related, Niacin, Niacin-simvastatin  er, Latex, Tape, Tapentadol, and Wound dressing adhesive  Social History   Tobacco Use   Smoking status: Never    Passive exposure: Past   Smokeless tobacco: Never  Vaping Use   Vaping Use: Never used  Substance Use Topics    Alcohol use: No   Drug use: No    Family History  Problem Relation Age of Onset   Coronary Atkins disease Father 60       CABG   Aortic aneurysm Father 86       repaired   Hyperlipidemia Father    Lung cancer Mother    Hypertension Mother    Cancer Mother        bladder   COPD Mother    Stomach cancer Maternal Grandfather     Review of Systems: A 12-system review of systems was performed and was negative except as noted in the HPI.  --------------------------------------------------------------------------------------------------  Physical Exam: There were no vitals taken for this visit.  General:  NAD. Neck: No JVD or HJR. Lungs: Clear to auscultation bilaterally without wheezes or crackles. Heart: Regular rate and rhythm without murmurs, rubs, or gallops. Abdomen: Soft, nontender, nondistended. Extremities: No lower extremity edema.  EKG:  ***  Lab Results  Component Value Date   WBC 11.9 (H) 08/19/2022   HGB 15.2 08/19/2022   HCT 46.3 08/19/2022   MCV 89.0 08/19/2022   PLT 393 08/19/2022    Lab Results  Component Value Date   NA 142 08/19/2022   K 3.8 08/19/2022   CL 108 08/19/2022   CO2 26 08/19/2022   BUN 23 08/19/2022   CREATININE 1.01 08/19/2022   GLUCOSE 129 (H) 08/19/2022   ALT 14 08/19/2022    Lab Results  Component Value Date   CHOL 253 (H) 04/07/2022   HDL 33.00 (L) 04/07/2022   LDLCALC 77 07/22/2021   LDLDIRECT 188.0 04/07/2022   TRIG 225.0 (H) 04/07/2022   CHOLHDL 8 04/07/2022    --------------------------------------------------------------------------------------------------  ASSESSMENT AND PLAN: Brian Atkins Brian Plumb, MD 10/06/2022 8:18 AM

## 2022-10-07 ENCOUNTER — Encounter: Payer: Self-pay | Admitting: Internal Medicine

## 2022-10-08 ENCOUNTER — Other Ambulatory Visit: Payer: Self-pay | Admitting: Internal Medicine

## 2022-10-08 ENCOUNTER — Other Ambulatory Visit: Payer: Self-pay | Admitting: Family

## 2022-10-18 ENCOUNTER — Encounter (INDEPENDENT_AMBULATORY_CARE_PROVIDER_SITE_OTHER): Payer: Self-pay

## 2022-10-19 ENCOUNTER — Other Ambulatory Visit: Payer: Self-pay

## 2022-10-19 MED ORDER — ATORVASTATIN CALCIUM 20 MG PO TABS: 20.0000 mg | ORAL_TABLET | Freq: Every day | ORAL | 0 refills | Status: DC

## 2022-10-21 ENCOUNTER — Ambulatory Visit: Payer: Medicare Other | Attending: Internal Medicine | Admitting: Physician Assistant

## 2022-10-21 ENCOUNTER — Ambulatory Visit (INDEPENDENT_AMBULATORY_CARE_PROVIDER_SITE_OTHER): Payer: Medicare Other

## 2022-10-21 ENCOUNTER — Encounter: Payer: Self-pay | Admitting: Physician Assistant

## 2022-10-21 VITALS — BP 132/84 | HR 69 | Ht 74.0 in | Wt 216.8 lb

## 2022-10-21 DIAGNOSIS — R0602 Shortness of breath: Secondary | ICD-10-CM | POA: Insufficient documentation

## 2022-10-21 DIAGNOSIS — E785 Hyperlipidemia, unspecified: Secondary | ICD-10-CM | POA: Insufficient documentation

## 2022-10-21 DIAGNOSIS — I10 Essential (primary) hypertension: Secondary | ICD-10-CM | POA: Diagnosis present

## 2022-10-21 DIAGNOSIS — Z86711 Personal history of pulmonary embolism: Secondary | ICD-10-CM | POA: Diagnosis not present

## 2022-10-21 DIAGNOSIS — R002 Palpitations: Secondary | ICD-10-CM | POA: Diagnosis not present

## 2022-10-21 DIAGNOSIS — I200 Unstable angina: Secondary | ICD-10-CM

## 2022-10-21 DIAGNOSIS — I2510 Atherosclerotic heart disease of native coronary artery without angina pectoris: Secondary | ICD-10-CM | POA: Diagnosis not present

## 2022-10-21 NOTE — Patient Instructions (Signed)
Medication Instructions:  No changes at this time.   *If you need a refill on your cardiac medications before your next appointment, please call your pharmacy*   Lab Work: None  If you have labs (blood work) drawn today and your tests are completely normal, you will receive your results only by: [place] (if you have MyChart) OR A paper copy in the mail If you have any lab test that is abnormal or we need to change your treatment, we will call you to review the results.   Testing/Procedures: Your provider has ordered a heart monitor to wear for 14 days. This will be mailed to your home with instructions on placement. Once you have finished the time frame requested, you will return monitor in box provided.   Your physician has recommended that you wear a Zio monitor.   This monitor is a medical device that records the heart's electrical activity. Doctors most often use these monitors to diagnose arrhythmias. Arrhythmias are problems with the speed or rhythm of the heartbeat. The monitor is a small device applied to your chest. You can wear one while you do your normal daily activities. While wearing this monitor if you have any symptoms to push the button and record what you felt. Once you have worn this monitor for the period of time provider prescribed (Usually 14 days), you will return the monitor device in the postage paid box. Once it is returned they will download the data collected and provide [place] with a report which the provider will then review and we will call you with those results. Important tips:  Avoid showering during the first 24 hours of wearing the monitor. Avoid excessive sweating to help maximize wear time. Do not submerge the device, no hot tubs, and no swimming pools. Keep any lotions or oils away from the patch. After 24 hours you may shower with the patch on. Take brief showers with your back facing the shower head.  Do not remove patch once it has been placed  because that will interrupt data and decrease adhesive wear time. Push the button when you have any symptoms and write down what you were feeling. Once you have completed wearing your monitor, remove and place into box which has postage paid and place in your outgoing mailbox.  If for some reason you have misplaced your box then call our office and we can provide another box and/or mail it off for you.      Follow-Up: At [facility], you and your health needs are our priority.  As part of our continuing mission to provide you with exceptional heart care, we have created designated Provider Care Teams.  These Care Teams include your primary Cardiologist (physician) and Advanced Practice Providers (APPs -  Physician Assistants and Nurse Practitioners) who all work together to provide you with the care you need, when you need it.   Your next appointment:   6 week(s)  The format for your next appointment:   In Person  Provider:   Nelva Bush, MD or [name], PA-C        Important Information About [place]

## 2022-10-21 NOTE — Progress Notes (Signed)
Cardiology Office Note    Date:  10/21/2022   ID:  JAI STEIL, DOB Apr 07, 1956, MRN [id number]  PCP:  [name], MD  Cardiologist:  Nelva Bush, MD  Electrophysiologist:  None   Chief Complaint: Follow up  History of Present Illness:   Brian Atkins is a 66 y.o. male with history of nonobstructive CAD by [place] in 11/2017 and coronary CTA in 11/2019, marginal zone B-cell lymphoma, restrictive lung disease, SBO requiring emergent surgery, PE with prior DVT/PE, HTN, peripheral neuropathy, and GERD status post Nissen fundoplication who presents for follow-up of CAD.   He was evaluated in 11/2017 for chest pain, palpitations, headaches, and elevated BP.  He subsequently underwent LHC which revealed moderate nonobstructive CAD involving the proximal/mid LAD and mid RCA.  Both lesions were not hemodynamically significant by fractional flow reserve (FFR of LAD 0.84, FFR RCA 0.94).  LVEF greater than 65% with a moderately elevated LVEDP consistent with diastolic dysfunction.  Medical therapy was recommended.  Echo 12/29/17 showed an EF of 60-65%, normal wall motion, normal LV diastolic function, and a mildly dilated LA.  He was seen in 11/2019 with recurrent chest pain and underwent coronary CTA which showed a calcium score of 496 and was the 86th percentile for age and sex matched control.  He had mild diffuse nonobstructive CAD with calcified plaque noted in the left main, LAD, LCx, and RCA with a maximum identified stenosis of 25 to 49%.  Medical therapy was recommended.  He was diagnosed with Covid in 12/2019, and did not require hospital admission.  He was seen in 01/2020 continuing to note persistent exertional dyspnea, fatigue, and cough following his COVID-19 infection.  He reported two-pillow orthopnea and a 5 pound weight gain despite poor appetite since contracting Covid.  In this setting, he underwent echo in 01/2020, which showed a normal LVEF with grade 1 diastolic dysfunction and no  significant valvular abnormalities.  He was evaluated by pulmonology in 02/2020 who felt like his symptoms were likely due to uncontrolled sleep apnea, GERD, and chronic rhinitis.  PFTs were recommended, and ultimately completed in 11/2020, which showed moderate restrictive lung disease with consideration for ILD.  He was seen by cardiology in 03/2020 noting multiple complaints including a several week history of numbness in his legs and feet that was most pronounced when lying down, which is followed by neurology.  He also noted exertional dyspnea that varied from one day to the next.  In the setting of lower extremity swelling, he had increased his Lasix to 40 mg daily and decreased his amlodipine with improvement in symptoms.  He was continued on Lasix 40 mg daily.  His leg pain was most consistent with a neuropathic process though given his risk factors he underwent lower extremity ABIs which were normal.  He was seen in 07/2020 reporting palpitations with ZIO monitor at that demonstrating sinus rhythm with rare PACs and PVCs with no sustained arrhythmia or prolonged pauses.   He was admitted to the hospital in 03/2022 with a bowel obstruction with emergent surgery and PE status post thrombectomy.  During that admission, echo demonstrated an EF of 60 to 65%, no regional wall motion abnormalities, grade 2 diastolic dysfunction, normal RV systolic function with mildly enlarged ventricular cavity size, and aortic valve sclerosis without evidence of stenosis.  He was most recently seen in the office in 07/2022 and was largely recovered from his admission in the spring.  However, he did feel more short of  breath and reported intermittent chest tightness with exertion.  Given symptoms, he underwent echo in 08/2022 which showed an EF of 60 to 65%, no regional wall motion abnormalities, mild LVH, grade 2 diastolic dysfunction, normal RV systolic function and ventricular cavity size, no significant valvular abnormalities,  and an estimated right atrial pressure of 3 mmHg.  Lexiscan MPI in 08/2022 showed no evidence of ischemia.  He comes in today and is doing reasonably well from a cardiac perspective with stable dyspnea.  He does continue to note an increase in palpitation burden with episodes occurring on an almost daily basis and typically lasting 5 to 10 seconds in duration.  Episodes are more noticeable while at rest.  No associated symptoms with palpitations.  He continues to note abdominal bloating and is being worked up by GI for this.  Adherent and tolerating apixaban.   Labs independently reviewed: 07/2022 - potassium 3.8, BUN 23, serum creatinine 1.01, albumin 3.9, AST/ALT normal, magnesium 2.3, Hgb 15.2, PLT 393 03/2022 - direct LDL 188, TC 253, TG 225, HDL 33, TSH normal, A1c 6.0  Past Medical History:  Diagnosis Date   Complication of anesthesia    slow to wake   Coronary artery disease, non-occlusive    a. LHC 12/18: ostLAD 20%, p/mLAD 60% FFR 0.84, ost ramus 50%, mRCA 50% FFR 0.94, EF 65%   COVID-19 virus infection 12/28/2019   Diverticulitis    Diverticulosis    Dyslipidemia    Dyspnea    Family history of adverse reaction to anesthesia    Mother - slow to wake   GERD (gastroesophageal reflux disease)    Hemorrhoids    Hiatal hernia    History of 2019 novel coronavirus disease (COVID-19) 12/24/2019   History of echocardiogram    a. TTE 1/19: EF of 60-65%, normal wall motion, normal LV diastolic function, mildly dilated LA   History of kidney stones    Labile hypertension    Lymphoma (HCC)    Medial epicondylitis of right elbow    Meralgia paresthetica of right side    Microscopic hematuria    Ocular migraine    Pneumonia    Sleep apnea    CPAP    Past Surgical History:  Procedure Laterality Date   BRONCHIAL WASHINGS N/A 08/21/2021   Procedure: BRONCHIAL WASHINGS;  Surgeon: Ottie Glazier, MD;  Location: [facility];  Service: Thoracic;  Laterality: N/A;   COLONOSCOPY     FLEXIBLE  BRONCHOSCOPY N/A 08/21/2021   Procedure: FLEXIBLE BRONCHOSCOPY;  Surgeon: Ottie Glazier, MD;  Location: [facility];  Service: Thoracic;  Laterality: N/A;   INTRAVASCULAR PRESSURE WIRE/FFR STUDY N/A 12/07/2017   Procedure: INTRAVASCULAR PRESSURE WIRE/FFR STUDY;  Surgeon: Nelva Bush, MD;  Location: [facility];  Service: Cardiovascular;  Laterality: N/A;   LEFT HEART CATH AND CORONARY ANGIOGRAPHY Left 12/07/2017   Procedure: LEFT HEART CATH AND CORONARY ANGIOGRAPHY;  Surgeon: Nelva Bush, MD;  Location: [facility];  Service: Cardiovascular;  Laterality: Left;   nissen funduplication  [id number]   [name]   PULMONARY THROMBECTOMY N/A 03/29/2022   Procedure: PULMONARY THROMBECTOMY;  Surgeon: Algernon Huxley, MD;  Location: [facility];  Service: Cardiovascular;  Laterality: N/A;   SEPTOPLASTY Bilateral 08/23/2019   Procedure: SEPTOPLASTY;  Surgeon: [name], MD;  Location: [facility];  Service: ENT;  Laterality: Bilateral;   SPLENECTOMY     TURBINATE REDUCTION Bilateral 08/23/2019   Procedure: TURBINATE REDUCTION;  Surgeon: [name], MD;  Location: [facility];  Service: ENT;  Laterality: Bilateral;  Latex sensitivity sleep apnea    Current Medications: No outpatient medications have been marked as taking for the 10/21/22 encounter (Office Visit) with Rise Mu, PA-C.    Allergies:   Avelox [moxifloxacin], Gadolinium derivatives, Contrast media [iodinated contrast media], Crestor [rosuvastatin calcium], Morphine and related, Niacin, Niacin-simvastatin er, Latex, Tape, Tapentadol, and Wound dressing adhesive   Social History   Socioeconomic History   Marital status: Married    Spouse name: Not on file   Number of children: 2   Years of education: 14   Highest education level: Not on file  Occupational History   Occupation: Shop Printmaker: [employer]    Comment: Development worker, international [employer]  Tobacco Use   Smoking status: Never     Passive exposure: Past   Smokeless tobacco: Never  Vaping Use   Vaping Use: Never used  Substance and Sexual Activity   Alcohol use: No   Drug use: No   Sexual activity: Yes  Other Topics Concern   Not on file  Social History Narrative   Not on file   Social Determinants of Health   Financial Resource Strain: Not on file  Food Insecurity: Not on file  Transportation Needs: Not on file  Physical Activity: Not on file  Stress: Not on file  Social Connections: Not on file     Family History:  The patient's family history includes Aortic aneurysm (age of onset: 34) in his father; COPD in his mother; Cancer in his mother; Coronary artery disease (age of onset: 64) in his father; Hyperlipidemia in his father; Hypertension in his mother; Lung cancer in his mother; Stomach cancer in his maternal grandfather.  ROS:   12-point review of systems is negative unless otherwise noted in the HPI.   EKGs/Labs/Other Studies Reviewed:    Studies reviewed were summarized above. The additional studies were reviewed today: As above.   EKG:  EKG is ordered today.  The EKG ordered today demonstrates NSR, 69 bpm, no acute ST-T changes  Recent Labs: 03/28/2022: B Natriuretic Peptide 17.9 04/07/2022: TSH 1.46 08/19/2022: ALT 14; BUN 23; Creatinine, Ser 1.01; Hemoglobin 15.2; Magnesium 2.3; Platelets 393; Potassium 3.8; Sodium 142  Recent Lipid Panel    Component Value Date/Time   CHOL 253 (H) 04/07/2022 1032   TRIG 225.0 (H) 04/07/2022 1032   HDL 33.00 (L) 04/07/2022 1032   CHOLHDL 8 04/07/2022 1032   VLDL 45.0 (H) 04/07/2022 1032   LDLCALC 77 07/22/2021 1224   LDLCALC 162 (H) 10/24/2020 1608   LDLDIRECT 188.0 04/07/2022 1032    PHYSICAL EXAM:    VS:  BP 132/84 (BP Location: Left Arm, Patient Position: Sitting, Cuff Size: Normal)   Pulse 69   Ht _0  (1.88 m)   Wt 216 lb 12.8 oz (98.3 kg)   SpO2 97%   BMI 27.84 kg/m   BMI: Body mass index is 27.84 kg/m.  Physical Exam Vitals  reviewed.  Constitutional:      Appearance: He is well-developed.  HENT:     Head: Normocephalic and atraumatic.  Eyes:     General:        Right eye: No discharge.        Left eye: No discharge.  Neck:     Vascular: No JVD.  Cardiovascular:     Rate and Rhythm: Normal rate and regular rhythm.     Heart sounds: Normal heart sounds, S1 normal and S2 normal. Heart  sounds not distant. No midsystolic click and no opening snap. No murmur heard.    No friction rub.  Pulmonary:     Effort: Pulmonary effort is normal. No respiratory distress.     Breath sounds: Normal breath sounds. No decreased breath sounds, wheezing or rales.  Chest:     Chest wall: No tenderness.  Abdominal:     General: There is no distension.  Musculoskeletal:     Cervical back: Normal range of motion.  Skin:    General: Skin is warm and dry.     Nails: There is no clubbing.  Neurological:     Mental Status: He is alert and oriented to person, place, and time.  Psychiatric:        Speech: Speech normal.        Behavior: Behavior normal.        Thought Content: Thought content normal.        Judgment: Judgment normal.     Wt Readings from Last 3 Encounters:  10/21/22 216 lb 12.8 oz (98.3 kg)  08/24/22 206 lb (93.4 kg)  08/19/22 209 lb (94.8 kg)     ASSESSMENT & PLAN:   Nonobstructive CAD: Symptoms overall stable.  Recent Lexiscan MPI showed no evidence of ischemia.  Continue aggressive risk factor modification and current medications including aspirin given recurrent DVT/PE, atorvastatin, carvedilol, and losartan.  SOB and recurrent DVT/PE: Dyspnea stable.  He remains on apixaban, now lifelong given second DVT/PE.  Recent Lexiscan MPI was without evidence of ischemia.  Echo with preserved LV systolic function with diastolic dysfunction.  No evidence of RV dysfunction on echo.  HTN: Blood pressure is well controlled in the office today.  He remains on carvedilol, HCTZ, and losartan.  HLD: LDL 188 in  03/2022.  Now back on atorvastatin 20 mg.  Plan to follow-up lipid panel and LFT at next visit.  Palpitations: Increase in tachycardia palpitation burden following PE.  Place a Zio patch.  He remains on carvedilol.    Disposition: F/u with [name] or an APP in 6 weeks.   Medication Adjustments/Labs and Tests Ordered: Current medicines are reviewed at length with the patient today.  Concerns regarding medicines are outlined above. Medication changes, Labs and Tests ordered today are summarized above and listed in the Patient Instructions accessible in Encounters.   Signed, [name], PA-C 10/21/2022 2:52 PM     [address], [place] [address] [phone]

## 2022-10-22 NOTE — Telephone Encounter (Signed)
Copied from [place] [phone]. Topic: Medicare AWV >> Oct 22, 2022 11:49 AM Devoria Glassing wrote: Reason for CRM: Left message for patient to schedule Annual Wellness Visit.  Please schedule with Nurse Health Advisor Denisa O'Brien-Blaney, LPN at [facility]. This appt can be telephone or office visit.  Please call [phone] ask for [facility]

## 2022-10-26 DIAGNOSIS — R002 Palpitations: Secondary | ICD-10-CM | POA: Diagnosis not present

## 2022-10-30 ENCOUNTER — Other Ambulatory Visit: Payer: Self-pay | Admitting: Internal Medicine

## 2022-11-07 ENCOUNTER — Other Ambulatory Visit: Payer: Self-pay | Admitting: Internal Medicine

## 2022-11-08 ENCOUNTER — Ambulatory Visit (INDEPENDENT_AMBULATORY_CARE_PROVIDER_SITE_OTHER): Payer: Medicare Other

## 2022-11-08 ENCOUNTER — Other Ambulatory Visit: Payer: Self-pay

## 2022-11-08 VITALS — BP 138/83 | HR 69 | Temp 97.1°F | Ht 74.0 in | Wt 213.4 lb

## 2022-11-08 DIAGNOSIS — Z0000 Encounter for general adult medical examination without abnormal findings: Secondary | ICD-10-CM

## 2022-11-08 NOTE — Telephone Encounter (Signed)
Refilled: 08/24/2022 Last OV: 08/24/2022 Next OV: 01/24/2023

## 2022-11-08 NOTE — Patient Instructions (Addendum)
Brian Atkins , Thank you for taking time to come for your Medicare Wellness Visit. I appreciate your ongoing commitment to your health goals. Please review the following plan we discussed and let me know if I can assist you in the future.   These are the goals we discussed:  Goals      Increase physical activity     Walk more for exercise        This is a list of the screening recommended for you and due dates:  Health Maintenance  Topic Date Due   COVID-19 Vaccine (3 - Pfizer risk series) 11/24/2022*   Hepatitis C Screening: USPSTF Recommendation to screen - Ages 18-79 yo.  01/03/2023*   Zoster (Shingles) Vaccine (1 of 2) 02/08/2023*   Flu Shot  03/20/2023*   Medicare Annual Wellness Visit  11/09/2023   Pneumonia Vaccine (4 - PPSV23 or PCV20) 10/16/2025   Colon Cancer Screening  08/24/2028   HPV Vaccine  Aged Out  *Topic was postponed. The date shown is not the original due date.   Advanced directives: End of life planning; Advance aging; Advanced directives discussed.  Copy of current HCPOA/Living Will requested.    Conditions/risks identified: none new  Next appointment: Follow up in one year for your annual wellness visit.   Preventive Care 3 Years and Older, Male  Preventive care refers to lifestyle choices and visits with your health care provider that can promote health and wellness. What does preventive care include? A yearly physical exam. This is also called an annual well check. Dental exams once or twice a year. Routine eye exams. Ask your health care provider how often you should have your eyes checked. Personal lifestyle choices, including: Daily care of your teeth and gums. Regular physical activity. Eating a healthy diet. Avoiding tobacco and drug use. Limiting alcohol use. Practicing safe sex. Taking low doses of aspirin every day. Taking vitamin and mineral supplements as recommended by your health care provider. What happens during an annual well  check? The services and screenings done by your health care provider during your annual well check will depend on your age, overall health, lifestyle risk factors, and family history of disease. Counseling  Your health care provider may ask you questions about your: Alcohol use. Tobacco use. Drug use. Emotional well-being. Home and relationship well-being. Sexual activity. Eating habits. History of falls. Memory and ability to understand (cognition). Work and work Statistician. Screening  You may have the following tests or measurements: Height, weight, and BMI. Blood pressure. Lipid and cholesterol levels. These may be checked every 5 years, or more frequently if you are over 69 years old. Skin check. Lung cancer screening. You may have this screening every year starting at age 12 if you have a 30-pack-year history of smoking and currently smoke or have quit within the past 15 years. Fecal occult blood test (FOBT) of the stool. You may have this test every year starting at age 61. Flexible sigmoidoscopy or colonoscopy. You may have a sigmoidoscopy every 5 years or a colonoscopy every 10 years starting at age 10. Prostate cancer screening. Recommendations will vary depending on your family history and other risks. Hepatitis C blood test. Hepatitis B blood test. Sexually transmitted disease (STD) testing. Diabetes screening. This is done by checking your blood sugar (glucose) after you have not eaten for a while (fasting). You may have this done every 1-3 years. Abdominal aortic aneurysm (AAA) screening. You may need this if you are a current or  former smoker. Osteoporosis. You may be screened starting at age 39 if you are at high risk. Talk with your health care provider about your test results, treatment options, and if necessary, the need for more tests. Vaccines  Your health care provider may recommend certain vaccines, such as: Influenza vaccine. This is recommended every  year. Tetanus, diphtheria, and acellular pertussis (Tdap, Td) vaccine. You may need a Td booster every 10 years. Zoster vaccine. You may need this after age 42. Pneumococcal 13-valent conjugate (PCV13) vaccine. One dose is recommended after age 56. Pneumococcal polysaccharide (PPSV23) vaccine. One dose is recommended after age 34. Talk to your health care provider about which screenings and vaccines you need and how often you need them. This information is not intended to replace advice given to you by your health care provider. Make sure you discuss any questions you have with your health care provider. Document Released: 01/02/2016 Document Revised: 08/25/2016 Document Reviewed: 10/07/2015 Elsevier Interactive Patient Education  2017 [employer] [place] Prevention in the Home Falls can cause injuries. They can happen to people of all ages. There are many things you can do to make your home safe and to help prevent falls. What can I do on the outside of my home? Regularly fix the edges of walkways and driveways and fix any cracks. Remove anything that might make you trip as you walk through a door, such as a raised step or threshold. Trim any bushes or trees on the path to your home. Use bright outdoor lighting. Clear any walking paths of anything that might make someone trip, such as rocks or tools. Regularly check to see if handrails are loose or broken. Make sure that both sides of any steps have handrails. Any raised decks and porches should have guardrails on the edges. Have any leaves, snow, or ice cleared regularly. Use sand or salt on walking paths during winter. Clean up any spills in your garage right away. This includes oil or grease spills. What can I do in the bathroom? Use night lights. Install grab bars by the toilet and in the tub and shower. Do not use towel bars as grab bars. Use non-skid mats or decals in the tub or shower. If you need to sit down in the shower, use a  plastic, non-slip stool. Keep the floor dry. Clean up any water that spills on the floor as soon as it happens. Remove soap buildup in the tub or shower regularly. Attach bath mats securely with double-sided non-slip rug tape. Do not have throw rugs and other things on the floor that can make you trip. What can I do in the bedroom? Use night lights. Make sure that you have a light by your bed that is easy to reach. Do not use any sheets or blankets that are too big for your bed. They should not hang down onto the floor. Have a firm chair that has side arms. You can use this for support while you get dressed. Do not have throw rugs and other things on the floor that can make you trip. What can I do in the kitchen? Clean up any spills right away. Avoid walking on wet floors. Keep items that you use a lot in easy-to-reach places. If you need to reach something above you, use a strong step stool that has a grab bar. Keep electrical cords out of the way. Do not use floor polish or wax that makes floors slippery. If you must use wax, use  non-skid floor wax. Do not have throw rugs and other things on the floor that can make you trip. What can I do with my stairs? Do not leave any items on the stairs. Make sure that there are handrails on both sides of the stairs and use them. Fix handrails that are broken or loose. Make sure that handrails are as long as the stairways. Check any carpeting to make sure that it is firmly attached to the stairs. Fix any carpet that is loose or worn. Avoid having throw rugs at the top or bottom of the stairs. If you do have throw rugs, attach them to the floor with carpet tape. Make sure that you have a light switch at the top of the stairs and the bottom of the stairs. If you do not have them, ask someone to add them for you. What else can I do to help prevent falls? Wear shoes that: Do not have high heels. Have rubber bottoms. Are comfortable and fit you  well. Are closed at the toe. Do not wear sandals. If you use a stepladder: Make sure that it is fully opened. Do not climb a closed stepladder. Make sure that both sides of the stepladder are locked into place. Ask someone to hold it for you, if possible. Clearly mark and make sure that you can see: Any grab bars or handrails. First and last steps. Where the edge of each step is. Use tools that help you move around (mobility aids) if they are needed. These include: Canes. Walkers. Scooters. Crutches. Turn on the lights when you go into a dark area. Replace any light bulbs as soon as they burn out. Set up your furniture so you have a clear path. Avoid moving your furniture around. If any of your floors are uneven, fix them. If there are any pets around you, be aware of where they are. Review your medicines with your doctor. Some medicines can make you feel dizzy. This can increase your chance of falling. Ask your doctor what other things that you can do to help prevent falls. This information is not intended to replace advice given to you by your health care provider. Make sure you discuss any questions you have with your health care provider. Document Released: 10/02/2009 Document Revised: 05/13/2016 Document Reviewed: 01/10/2015 Elsevier Interactive Patient Education  2017 [employer].

## 2022-11-08 NOTE — Progress Notes (Addendum)
Subjective:   Brian Atkins is a 66 y.o. male who presents for an Initial Medicare Annual Wellness Visit.  Review of Systems    No ROS.  Medicare Wellness   Cardiac Risk Factors include: advanced age (>27mn, >>75women);male gender;hypertension     Objective:    Today's Vitals   11/08/22 1455  BP: 138/83  Pulse: 69  Temp: (!) 97.1 F (36.2 C)  SpO2: 99%  Weight: 213 lb 6.4 oz (96.8 kg)  Height: _0  (1.88 m)   Body mass index is 27.4 kg/m.     11/08/2022    3:03 PM 03/29/2022    1:51 PM 03/29/2022    9:00 AM 03/28/2022    3:44 PM 01/13/2022    3:55 PM 08/21/2021   11:18 AM 08/19/2021   11:41 AM  Advanced Directives  Does Patient Have a Medical Advance Directive? _1  Yes Yes  Type of AParamed[place]of [place]ag[place]Living will [place] will [place]Living will [place]Living will Living will [place]Living will   Does patient want to make changes to medical advance directive? No - Patient declined  No - Guardian declined  Yes (Inpatient - patient defers changing a medical advance directive at this time - Information given) No - Patient declined   Copy of [place] Villagein Chart? Yes - validated most recent copy scanned in chart (See row information)  No - copy requested   No - copy requested     Current Medications (verified) Outpatient Encounter Medications as of 11/08/2022  Medication Sig   atorvastatin (LIPITOR) 20 MG tablet Take 1 tablet (20 mg total) by mouth daily.   carvedilol (COREG) 12.5 MG tablet TAKE ONE TABLET BY MOUTH TWICE A DAY   cyanocobalamin (,VITAMIN B-12,) 1000 MCG/ML injection INJECT 1ML INTO THE MUSCLE EVERY 30 DAYS   ELIQUIS 5 MG TABS tablet TAKE ONE TABLET BY MOUTH TWICE A DAY   enoxaparin (LOVENOX) 100 MG/ML injection Inject 1 mL (100 mg total) into the skin every 12 (twelve) hours.   famotidine (PEPCID) 20 MG  tablet Take 20 mg by mouth 2 (two) times daily.   hydrochlorothiazide (MICROZIDE) 12.5 MG capsule TAKE ONE CAPSULE BY MOUTH ONE TIME DAILY (Patient taking differently: Take 25 mg by mouth daily.)   losartan (COZAAR) 100 MG tablet TAKE ONE TABLET BY MOUTH ONE TIME DAILY   Multiple Vitamins-Minerals (MULTIVITAMIN WITH MINERALS) tablet Take 1 tablet by mouth daily.   mupirocin ointment (BACTROBAN) 2 % Apply 1 application. topically 2 (two) times daily. In nose x 5 days   nitroGLYCERIN (NITROSTAT) 0.4 MG SL tablet Place 1 tablet (0.4 mg total) under the tongue every 5 (five) minutes as needed for chest pain. Maximum of 3 doses.   omeprazole (PRILOSEC) 40 MG capsule Take 40 mg by mouth daily.   ondansetron (ZOFRAN) 4 MG tablet Take 1 tablet by mouth every 8 (eight) hours as needed.   ONETOUCH VERIO test strip USE TO CHECK BLOOD SUGAR AS NEEDED   Prucalopride Succinate (MOTEGRITY) 2 MG TABS Take 2 mg by mouth daily as needed (constipation).   rOPINIRole (REQUIP) 0.25 MG tablet Take 0.25 mg by mouth in the morning and at bedtime.    Syringe/Needle, Disp, (SYRINGE 3CC/25GX1") 25G X 1" 3 ML MISC Use for b12 injections   tiZANidine (ZANAFLEX) 4 MG tablet TAKE ONE-HALF TO ONE TABLET BY MOUTH AT BEDTIME AS NEEDED FOR MUSCLE  SPASM   triamcinolone (NASACORT) 55 MCG/ACT AERO nasal inhaler Place 2 sprays into the nose at bedtime.   zolpidem (AMBIEN CR) 12.5 MG CR tablet TAKE ONE TABLET BY MOUTH AT BEDTIME AS NEEDED FOR SLEEP   [DISCONTINUED] eszopiclone 3 MG TABS Take 1 tablet (3 mg total) by mouth at bedtime. Alternating with ambien   No facility-administered encounter medications on file as of 11/08/2022.    Allergies (verified) Avelox [moxifloxacin], Gadolinium derivatives, Contrast media [iodinated contrast media], Crestor [rosuvastatin calcium], Morphine and related, Niacin, Niacin-simvastatin er, Latex, Tape, Tapentadol, and Wound dressing adhesive   History: Past Medical History:  Diagnosis Date    Complication of anesthesia    slow to wake   Coronary artery disease, non-occlusive    a. LHC 12/18: ostLAD 20%, p/mLAD 60% FFR 0.84, ost ramus 50%, mRCA 50% FFR 0.94, EF 65%   COVID-19 virus infection 12/28/2019   Diverticulitis    Diverticulosis    Dyslipidemia    Dyspnea    Family history of adverse reaction to anesthesia    Mother - slow to wake   GERD (gastroesophageal reflux disease)    Hemorrhoids    Hiatal hernia    History of 2019 novel coronavirus disease (COVID-19) 12/24/2019   History of echocardiogram    a. TTE 1/19: EF of 60-65%, normal wall motion, normal LV diastolic function, mildly dilated LA   History of kidney stones    Labile hypertension    Lymphoma (HCC)    Medial epicondylitis of right elbow    Meralgia paresthetica of right side    Microscopic hematuria    Ocular migraine    Pneumonia    Sleep apnea    CPAP   Past Surgical History:  Procedure Laterality Date   BRONCHIAL WASHINGS N/A 08/21/2021   Procedure: BRONCHIAL WASHINGS;  Surgeon: Ottie Glazier, MD;  Location: [facility];  Service: Thoracic;  Laterality: N/A;   COLONOSCOPY     FLEXIBLE BRONCHOSCOPY N/A 08/21/2021   Procedure: FLEXIBLE BRONCHOSCOPY;  Surgeon: Ottie Glazier, MD;  Location: [facility];  Service: Thoracic;  Laterality: N/A;   INTRAVASCULAR PRESSURE WIRE/FFR STUDY N/A 12/07/2017   Procedure: INTRAVASCULAR PRESSURE WIRE/FFR STUDY;  Surgeon: Nelva Bush, MD;  Location: [place] [facility];  Service: Cardiovascular;  Laterality: N/A;   LEFT HEART CATH AND CORONARY ANGIOGRAPHY Left 12/07/2017   Procedure: LEFT HEART CATH AND CORONARY ANGIOGRAPHY;  Surgeon: Nelva Bush, MD;  Location: [place] [facility];  Service: Cardiovascular;  Laterality: Left;   nissen funduplication  [id number]   [name]   PULMONARY THROMBECTOMY N/A 03/29/2022   Procedure: PULMONARY THROMBECTOMY;  Surgeon: Algernon Huxley, MD;  Location: [place] [facility];  Service: Cardiovascular;  Laterality: N/A;    SEPTOPLASTY Bilateral 08/23/2019   Procedure: SEPTOPLASTY;  Surgeon: [name], MD;  Location: [place];  Service: ENT;  Laterality: Bilateral;   SPLENECTOMY     TURBINATE REDUCTION Bilateral 08/23/2019   Procedure: TURBINATE REDUCTION;  Surgeon: [name], MD;  Location: [place];  Service: ENT;  Laterality: Bilateral;  Latex sensitivity sleep apnea   Family History  Problem Relation Age of Onset   Coronary artery disease Father 72       CABG   Aortic aneurysm Father 21       repaired   Hyperlipidemia Father    Lung cancer Mother    Hypertension Mother    Cancer Mother        bladder   COPD Mother    Stomach  cancer Maternal Grandfather    Social History   Socioeconomic History   Marital status: Married    Spouse name: Not on file   Number of children: 2   Years of education: 14   Highest education level: Not on file  Occupational History   Occupation: Shop Printmaker: [employer]    Comment: [place]  Tobacco Use   Smoking status: Never    Passive exposure: Past   Smokeless tobacco: Never  Vaping Use   Vaping Use: Never used  Substance and Sexual Activity   Alcohol use: No   Drug use: No   Sexual activity: Yes  Other Topics Concern   Not on file  Social History Narrative   Not on file   Social Determinants of Health   Financial Resource Strain: Low Risk  (11/08/2022)   Overall Financial Resource Strain (CARDIA)    Difficulty of Paying Living Expenses: Not hard at all  Food Insecurity: No Food Insecurity (11/08/2022)   Hunger Vital Sign    Worried About Running Out of Food in the Last Year: Never true    Ran Out of Food in the Last Year: Never true  Transportation Needs: No Transportation Needs (11/08/2022)   PRAPARE - Hydrologist (Medical): No    Lack of Transportation (Non-Medical): No  Physical Activity: Unknown (11/08/2022)   Exercise Vital Sign    Days of Exercise per Week: 4  days    Minutes of Exercise per Session: Not on file  Stress: No Stress Concern Present (11/08/2022)   [place]    Feeling of Stress : Not at all  Social Connections: Unknown (11/08/2022)   Social Connection and Isolation Panel [NHANES]    Frequency of Communication with Friends and Family: Not on file    Frequency of Social Gatherings with Friends and Family: Not on file    Attends Religious Services: Not on file    Active Member of Clubs or Organizations: Not on file    Attends Archivist Meetings: Not on file    Marital Status: Married    Tobacco Counseling Counseling given: Not Answered   Clinical Intake:  Pre-visit preparation completed: Yes        Diabetes: No  How often do you need to have someone help you when you read instructions, pamphlets, or other written materials from your doctor or pharmacy?: 1 - Never  Interpreter Needed?: No     Activities of Daily Living    11/08/2022    3:07 PM 03/29/2022    9:00 AM  In your present state of health, do you have any difficulty performing the following activities:  Hearing? 1 0  Comment Hearing aids   Vision? 0 0  Difficulty concentrating or making decisions? 0 0  Walking or climbing stairs? 0 0  Dressing or bathing? 0 0  Doing errands, shopping? 0 1  Preparing Food and eating ? N   Using the Toilet? N   In the past six months, have you accidently leaked urine? N   Do you have problems with loss of bowel control? N   Managing your Medications? N   Managing your Finances? N   Housekeeping or managing your Housekeeping? N     Patient Care Team: [name], MD as PCP - General (Internal Medicine) End, Harrell Gave, MD as PCP - Cardiology (Cardiology)  Indicate any recent Medical Services you may  have received from other than [facility] providers in the past year (date may be approximate).     Assessment:   This is a routine  wellness examination for [place].  Hearing/Vision screen Hearing Screening - Comments:: Hearing aids Vision Screening - Comments:: Followed by [facility] Wears corrective lenses They have seen their ophthalmologist in the last 12 months.    Dietary issues and exercise activities discussed: Current Exercise Habits: Home exercise routine, Type of exercise: walking, Frequency (Times/Week): 4, Intensity: Mild Healthy diet Good water intake   Goals Addressed             This Visit's Progress    Increase physical activity       Walk more for exercise       Depression Screen    11/08/2022    2:57 PM 08/24/2022    3:33 PM 07/12/2022    2:10 PM 06/29/2022    4:52 PM 04/12/2022   11:11 AM 10/26/2021    3:58 PM 07/22/2021   11:43 AM  PHQ 2/9 Scores  PHQ - 2 Score 0 0 0 0 0 0 0    Fall Risk    11/08/2022    3:05 PM 08/24/2022    3:33 PM 07/12/2022    2:09 PM 06/29/2022    4:52 PM 04/12/2022   11:11 AM  [place] in the past year? 0 0 0 0 0  Number falls in past yr: 0      Injury with Fall? 0      Risk for fall due to : _0   Follow up Falls evaluation completed;Falls prevention discussed Falls evaluation completed Falls evaluation completed Falls evaluation completed Falls evaluation completed    FALL RISK PREVENTION PERTAINING TO THE HOME: Home free of loose throw rugs in walkways, pet beds, electrical cords, etc? Yes  Adequate lighting in your home to reduce risk of falls? Yes   ASSISTIVE DEVICES UTILIZED TO PREVENT FALLS: Life alert? No  Use of a cane, walker or w/c? No   TIMED UP AND GO: Was the test performed? Yes .  Length of time to ambulate 10 feet: 10 sec.   Gait steady and fast without use of assistive device  Cognitive Function:        11/08/2022    3:19 PM  6CIT Screen  What Year? 0 points  What month? 0 points  What time? 0 points  Count back from 20 0 points  Months  in reverse 0 points  Repeat phrase 0 points  Total Score 0 points    Immunizations Immunization History  Administered Date(s) Administered   Fluad Quad(high Dose 65+) 10/24/2020, 10/26/2021   HIB (PRP-T) 05/29/2015   Influenza Inj Mdck Quad Pf 09/29/2019   Influenza Inj Mdck Quad With Preservative 11/26/2018   Influenza Whole 09/18/2010   Influenza,inj,Quad PF,6+ Mos 09/14/2016   Influenza-Unspecified 11/19/2012, 10/09/2013, 10/11/2015, 09/30/2021   PFIZER(Purple Top)SARS-COV-2 Vaccination 04/05/2020, 05/03/2020   Pneumococcal Conjugate-13 09/07/2016   Pneumococcal Polysaccharide-23 02/17/2015, 10/16/2020   Tdap 08/25/2008   Covid-19 vaccine status: Completed vaccines x2.  Shingrix Completed?: No.    Education has been provided regarding the importance of this vaccine. Patient has been advised to call insurance company to determine out of pocket expense if they have not yet received this vaccine. Advised may also receive vaccine at local pharmacy or Health Dept. Verbalized acceptance and understanding.  Screening Tests Health  Maintenance  Topic Date Due   COVID-19 Vaccine (3 - Pfizer risk series) 11/24/2022 (Originally 05/31/2020)   Hepatitis C Screening  01/03/2023 (Originally 08/28/1974)   Zoster Vaccines- Shingrix (1 of 2) 02/08/2023 (Originally 08/29/1975)   INFLUENZA VACCINE  03/20/2023 (Originally 07/20/2022)   Medicare Annual Wellness (AWV)  11/09/2023   Pneumonia Vaccine 65+ Years old (4 - PPSV23 or PCV20) 10/16/2025   COLONOSCOPY (Pts 45-77yr Insurance coverage will need to be confirmed)  08/24/2028   HPV VACCINES  Aged Out    Health Maintenance There are no preventive care reminders to display for this patient.  Lung Cancer Screening: (Low Dose CT Chest recommended if Age 547-80years, 30 pack-year currently smoking OR have quit w/in 15years.) does not qualify.   Hepatitis C Screening: deferred per patient preference. Next office visit with lab draw.   Vision Screening:  Recommended annual ophthalmology exams for early detection of glaucoma and other disorders of the eye.  Dental Screening: Recommended annual dental exams for proper oral hygiene  Community Resource Referral / Chronic Care Management: CRR required this visit?  No   CCM required this visit?  No      Plan:     I have personally reviewed and noted the following in the patient's chart:   Medical and social history Use of alcohol, tobacco or illicit drugs  Current medications and supplements including opioid prescriptions. Patient is not currently taking opioid prescriptions. Functional ability and status Nutritional status Physical activity Advanced directives List of other physicians Hospitalizations, surgeries, and ER visits in previous 12 months Vitals Screenings to include cognitive, depression, and falls Referrals and appointments  In addition, I have reviewed and discussed with patient certain preventive protocols, quality metrics, and best practice recommendations. A written personalized care plan for preventive services as well as general preventive health recommendations were provided to patient.     DBeltrami LPN   [id number]   I have reviewed the above information and agree with above.   TDeborra Medina MD

## 2022-11-08 NOTE — Telephone Encounter (Signed)
Medication pended for approval.  

## 2022-11-09 MED ORDER — ESZOPICLONE 3 MG PO TABS: 3.0000 mg | ORAL_TABLET | Freq: Every day | ORAL | 5 refills | Status: DC

## 2022-11-10 NOTE — Addendum Note (Signed)
Addended by: Leta Jungling on: 11/10/2022 05:37 PM   Modules accepted: Level of Service

## 2022-11-17 ENCOUNTER — Ambulatory Visit: Payer: Medicare Other | Admitting: Internal Medicine

## 2022-11-22 NOTE — Progress Notes (Signed)
Cardiology Office Note    Date:  12/03/2022   ID:  Brian Atkins, DOB 1956/06/13, MRN [id number]  PCP:  [name], MD  Cardiologist:  Nelva Bush, MD  Electrophysiologist:  None   Chief Complaint: Follow-up  History of Present Illness:   Brian Atkins is a 66 y.o. male with history of nonobstructive CAD by [place] in 11/2017 and coronary CTA in 11/2019, marginal zone B-cell lymphoma, restrictive lung disease, SBO requiring emergent surgery, PE with prior DVT/PE, HTN, peripheral neuropathy, sleep apnea on CPAP, and GERD status post Nissen fundoplication who presents for follow-up of Zio patch.   He was evaluated in 11/2017 for chest pain, palpitations, headaches, and elevated BP.  He subsequently underwent LHC which revealed moderate nonobstructive CAD involving the proximal/mid LAD and mid RCA.  Both lesions were not hemodynamically significant by fractional flow reserve (FFR of LAD 0.84, FFR RCA 0.94).  LVEF greater than 65% with a moderately elevated LVEDP consistent with diastolic dysfunction.  Medical therapy was recommended.  Echo 12/29/17 showed an EF of 60-65%, normal wall motion, normal LV diastolic function, and a mildly dilated LA.  He was seen in 11/2019 with recurrent chest pain and underwent coronary CTA which showed a calcium score of 496 and was the 86th percentile.  He had mild diffuse nonobstructive CAD with calcified plaque noted in the left main, LAD, LCx, and RCA with a maximum identified stenosis of 25 to 49%.  Medical therapy was recommended.  He was diagnosed with Covid in 12/2019, and did not require hospital admission.  He was seen in 01/2020 continuing to note persistent exertional dyspnea, fatigue, and cough following his COVID-19 infection.  He reported two-pillow orthopnea and a 5 pound weight gain despite poor appetite since contracting Covid.  In this setting, he underwent echo in 01/2020, which showed a normal LVEF with grade 1 diastolic dysfunction and no  significant valvular abnormalities.  He was evaluated by pulmonology in 02/2020 who felt like his symptoms were likely due to uncontrolled sleep apnea, GERD, and chronic rhinitis.  PFTs were recommended, and ultimately completed in 11/2020, which showed moderate restrictive lung disease with consideration for ILD.  He was seen by cardiology in 03/2020 noting multiple complaints including a several week history of numbness in his legs and feet that was most pronounced when lying down, which is followed by neurology.  He also noted exertional dyspnea that varied from one day to the next.  In the setting of lower extremity swelling, he had increased his Lasix to 40 mg daily and decreased his amlodipine with improvement in symptoms.  He was continued on Lasix 40 mg daily.  His leg pain was most consistent with a neuropathic process though given his risk factors he underwent lower extremity ABIs which were normal.  He was seen in 07/2020 reporting palpitations with ZIO monitor at that demonstrating sinus rhythm with rare PACs and PVCs with no sustained arrhythmia or prolonged pauses.    He was admitted to the hospital in 03/2022 with a bowel obstruction with emergent surgery and PE status post thrombectomy.  During that admission, echo demonstrated an EF of 60 to 65%, no regional wall motion abnormalities, grade 2 diastolic dysfunction, normal RV systolic function with mildly enlarged ventricular cavity size, and aortic valve sclerosis without evidence of stenosis.   He was seen in the office in 07/2022 and was largely recovered from his admission in the spring.  However, he did feel more short of breath and  reported intermittent chest tightness with exertion.  Given symptoms, he underwent echo in 08/2022 which showed an EF of 60 to 65%, no regional wall motion abnormalities, mild LVH, grade 2 diastolic dysfunction, normal RV systolic function and ventricular cavity size, no significant valvular abnormalities, and an  estimated right atrial pressure of 3 mmHg.  Lexiscan MPI in 08/2022 showed no evidence of ischemia.  He was last seen in the office on 10/21/2022 and was without symptoms of angina or decompensation with stable dyspnea.  He did note an increase in palpitation burden occurring on an almost daily basis and lasting 5 to 10 seconds in duration.  Zio patch showed a predominant rhythm of sinus with an average rate of 66 bpm (range 47 to 128 bpm, 1 run of SVT lasting 4 beats with a maximum rate of 128 bpm, 1 pause occurred lasting 3.3 seconds at 7:48 AM (Sunday, 11/07/2022) and was asymptomatic, and rare atrial/ventricular ectopy.  Patient triggered events corresponded to sinus rhythm and PVCs.  He is doing reasonably well from a cardiac perspective, without symptoms of angina or decompensation.  His palpitation burden has improved.  He does continue to note a generalized intermittent unsteadiness without frank syncope.  He will be needing shoulder surgery down the road.  [facility] Activity Status Index: Greater than 4 METs without cardiac limitation Revised Cardiac Risk Index: Low risk for noncardiac surgery with an estimated rate of 0.9% for adverse cardiac event in the perioperative timeframe   Labs independently reviewed: 07/2022 - potassium 3.8, BUN 23, serum creatinine 1.01, albumin 3.9, AST/ALT normal, magnesium 2.3, Hgb 15.2, PLT 393 03/2022 - direct LDL 188, TC 253, TG 225, HDL 33, TSH normal, A1c 6.0  Past Medical History:  Diagnosis Date   Complication of anesthesia    slow to wake   Coronary artery disease, non-occlusive    a. LHC 12/18: ostLAD 20%, p/mLAD 60% FFR 0.84, ost ramus 50%, mRCA 50% FFR 0.94, EF 65%   COVID-19 virus infection 12/28/2019   Diverticulitis    Diverticulosis    Dyslipidemia    Dyspnea    Family history of adverse reaction to anesthesia    Mother - slow to wake   GERD (gastroesophageal reflux disease)    Hemorrhoids    Hiatal hernia    History of 2019 novel  coronavirus disease (COVID-19) 12/24/2019   History of echocardiogram    a. TTE 1/19: EF of 60-65%, normal wall motion, normal LV diastolic function, mildly dilated LA   History of kidney stones    Labile hypertension    Lymphoma (HCC)    Medial epicondylitis of right elbow    Meralgia paresthetica of right side    Microscopic hematuria    Ocular migraine    Pneumonia    Sleep apnea    CPAP    Past Surgical History:  Procedure Laterality Date   BRONCHIAL WASHINGS N/A 08/21/2021   Procedure: BRONCHIAL WASHINGS;  Surgeon: Ottie Glazier, MD;  Location: [facility];  Service: Thoracic;  Laterality: N/A;   COLONOSCOPY     FLEXIBLE BRONCHOSCOPY N/A 08/21/2021   Procedure: FLEXIBLE BRONCHOSCOPY;  Surgeon: Ottie Glazier, MD;  Location: [facility];  Service: Thoracic;  Laterality: N/A;   INTRAVASCULAR PRESSURE WIRE/FFR STUDY N/A 12/07/2017   Procedure: INTRAVASCULAR PRESSURE WIRE/FFR STUDY;  Surgeon: Nelva Bush, MD;  Location: [facility];  Service: Cardiovascular;  Laterality: N/A;   LEFT HEART CATH AND CORONARY ANGIOGRAPHY Left 12/07/2017   Procedure: LEFT HEART CATH AND CORONARY ANGIOGRAPHY;  Surgeon: Nelva Bush, MD;  Location: [facility];  Service: Cardiovascular;  Laterality: Left;   nissen funduplication  0932   [name]   PULMONARY THROMBECTOMY N/A 03/29/2022   Procedure: PULMONARY THROMBECTOMY;  Surgeon: Algernon Huxley, MD;  Location: [facility];  Service: Cardiovascular;  Laterality: N/A;   SEPTOPLASTY Bilateral 08/23/2019   Procedure: SEPTOPLASTY;  Surgeon: [name], MD;  Location: [facility];  Service: ENT;  Laterality: Bilateral;   SPLENECTOMY     TURBINATE REDUCTION Bilateral 08/23/2019   Procedure: TURBINATE REDUCTION;  Surgeon: [name], MD;  Location: [facility];  Service: ENT;  Laterality: Bilateral;  Latex sensitivity sleep apnea    Current Medications: Current Meds  Medication Sig   atorvastatin  (LIPITOR) 20 MG tablet Take 1 tablet (20 mg total) by mouth daily.   carvedilol (COREG) 6.25 MG tablet Take 1 tablet (6.25 mg total) by mouth 2 (two) times daily.   cyanocobalamin (,VITAMIN B-12,) 1000 MCG/ML injection INJECT 1ML INTO THE MUSCLE EVERY 30 DAYS   ELIQUIS 5 MG TABS tablet TAKE ONE TABLET BY MOUTH TWICE A DAY   eszopiclone 3 MG TABS Take 1 tablet (3 mg total) by mouth at bedtime. Alternating with ambien   ezetimibe (ZETIA) 10 MG tablet Take 1 tablet by mouth daily.   famotidine (PEPCID) 20 MG tablet Take 20 mg by mouth 2 (two) times daily.   furosemide (LASIX) 40 MG tablet Take 40 mg by mouth as needed.   hydrochlorothiazide (MICROZIDE) 12.5 MG capsule TAKE ONE CAPSULE BY MOUTH ONE TIME DAILY   losartan (COZAAR) 100 MG tablet TAKE ONE TABLET BY MOUTH ONE TIME DAILY   Multiple Vitamins-Minerals (MULTIVITAMIN WITH MINERALS) tablet Take 1 tablet by mouth daily.   mupirocin ointment (BACTROBAN) 2 % Apply 1 application. topically 2 (two) times daily. In nose x 5 days   nitroGLYCERIN (NITROSTAT) 0.4 MG SL tablet Place 1 tablet (0.4 mg total) under the tongue every 5 (five) minutes as needed for chest pain. Maximum of 3 doses.   omeprazole (PRILOSEC) 40 MG capsule Take 40 mg by mouth daily.   ondansetron (ZOFRAN) 4 MG tablet Take 1 tablet by mouth every 8 (eight) hours as needed.   ONETOUCH VERIO test strip USE TO CHECK BLOOD SUGAR AS NEEDED   prochlorperazine (COMPAZINE) 5 MG tablet Take 5 mg by mouth every 6 (six) hours as needed.   Prucalopride Succinate (MOTEGRITY) 2 MG TABS Take 2 mg by mouth daily as needed (constipation).   rOPINIRole (REQUIP) 0.25 MG tablet Take 0.25 mg by mouth in the morning and at bedtime.    Syringe/Needle, Disp, (SYRINGE 3CC/25GX1") 25G X 1" 3 ML MISC Use for b12 injections   tiZANidine (ZANAFLEX) 4 MG tablet TAKE ONE-HALF TO ONE TABLET BY MOUTH AT BEDTIME AS NEEDED FOR MUSCLE SPASM   triamcinolone (NASACORT) 55 MCG/ACT AERO nasal inhaler Place 2 sprays into  the nose at bedtime.   zolpidem (AMBIEN CR) 12.5 MG CR tablet TAKE ONE TABLET BY MOUTH AT BEDTIME AS NEEDED FOR SLEEP   [DISCONTINUED] carvedilol (COREG) 12.5 MG tablet TAKE ONE TABLET BY MOUTH TWICE A DAY    Allergies:   Avelox [moxifloxacin], Gadolinium derivatives, Contrast media [iodinated contrast media], Crestor [rosuvastatin calcium], Morphine and related, Niacin, Niacin-simvastatin er, Latex, Tape, Tapentadol, and Wound dressing adhesive   Social History   Socioeconomic History   Marital status: Married    Spouse name: Not on file   Number of children: 2   Years  of education: 14   Highest education level: Not on file  Occupational History   Occupation: Shop Printmaker: [employer]    Comment: [employer]  Tobacco Use   Smoking status: Never    Passive exposure: Past   Smokeless tobacco: Never  Vaping Use   Vaping Use: Never used  Substance and Sexual Activity   Alcohol use: No   Drug use: No   Sexual activity: Yes  Other Topics Concern   Not on file  Social History Narrative   Not on file   Social Determinants of Health   Financial Resource Strain: Low Risk  (11/08/2022)   Overall Financial Resource Strain (CARDIA)    Difficulty of Paying Living Expenses: Not hard at all  Food Insecurity: No Food Insecurity (11/08/2022)   Hunger Vital Sign    Worried About Running Out of Food in the Last Year: Never true    Ran Out of Food in the Last Year: Never true  Transportation Needs: No Transportation Needs (11/08/2022)   PRAPARE - Hydrologist (Medical): No    Lack of Transportation (Non-Medical): No  Physical Activity: Unknown (11/08/2022)   Exercise Vital Sign    Days of Exercise per Week: 4 days    Minutes of Exercise per Session: Not on file  Stress: No Stress Concern Present (11/08/2022)   [place]    Feeling of Stress : Not at all  Social Connections:  Unknown (11/08/2022)   Social Connection and Isolation Panel [NHANES]    Frequency of Communication with Friends and Family: Not on file    Frequency of Social Gatherings with Friends and Family: Not on file    Attends Religious Services: Not on file    Active Member of Clubs or Organizations: Not on file    Attends Archivist Meetings: Not on file    Marital Status: Married     Family History:  The patient's family history includes Aortic aneurysm (age of onset: 23) in his father; COPD in his mother; Cancer in his mother; Coronary artery disease (age of onset: 13) in his father; Hyperlipidemia in his father; Hypertension in his mother; Lung cancer in his mother; Stomach cancer in his maternal grandfather.  ROS:   12-point review of systems is negative unless otherwise noted in the HPI.   EKGs/Labs/Other Studies Reviewed:    Studies reviewed were summarized above. The additional studies were reviewed today: As above.  EKG:  EKG is ordered today.  The EKG ordered today demonstrates sinus bradycardia, 56 bpm, no acute ST-T changes  Recent Labs: 03/28/2022: B Natriuretic Peptide 17.9 04/07/2022: TSH 1.46 08/19/2022: ALT 14; BUN 23; Creatinine, Ser 1.01; Hemoglobin 15.2; Magnesium 2.3; Platelets 393; Potassium 3.8; Sodium 142  Recent Lipid Panel    Component Value Date/Time   CHOL 253 (H) 04/07/2022 1032   TRIG 225.0 (H) 04/07/2022 1032   HDL 33.00 (L) 04/07/2022 1032   CHOLHDL 8 04/07/2022 1032   VLDL 45.0 (H) 04/07/2022 1032   LDLCALC 77 07/22/2021 1224   LDLCALC 162 (H) 10/24/2020 1608   LDLDIRECT 188.0 04/07/2022 1032    PHYSICAL EXAM:    VS:  BP 120/80 (BP Location: Left Arm, Patient Position: Sitting, Cuff Size: Normal)   Pulse (!) 56   Ht _0  (1.88 m)   Wt 214 lb 6 oz (97.2 kg)   SpO2 95%   BMI 27.52 kg/m   BMI:  Body mass index is 27.52 kg/m.  Physical Exam Vitals reviewed.  Constitutional:      Appearance: He is well-developed.  HENT:     Head:  Normocephalic and atraumatic.  Eyes:     General:        Right eye: No discharge.        Left eye: No discharge.  Neck:     Vascular: No JVD.  Cardiovascular:     Rate and Rhythm: Regular rhythm. Bradycardia present.     Heart sounds: Normal heart sounds, S1 normal and S2 normal. Heart sounds not distant. No midsystolic click and no opening snap. No murmur heard.    No friction rub.  Pulmonary:     Effort: Pulmonary effort is normal. No respiratory distress.     Breath sounds: Normal breath sounds. No decreased breath sounds, wheezing or rales.  Chest:     Chest wall: No tenderness.  Abdominal:     General: There is no distension.  Musculoskeletal:     Cervical back: Normal range of motion.  Skin:    General: Skin is warm and dry.     Nails: There is no clubbing.  Neurological:     Mental Status: He is alert and oriented to person, place, and time.  Psychiatric:        Speech: Speech normal.        Behavior: Behavior normal.        Thought Content: Thought content normal.        Judgment: Judgment normal.     Wt Readings from Last 3 Encounters:  12/03/22 214 lb 6 oz (97.2 kg)  11/08/22 213 lb 6.4 oz (96.8 kg)  10/21/22 216 lb 12.8 oz (98.3 kg)     ASSESSMENT & PLAN:   Nonobstructive CAD: No symptoms suggestive of angina or decompensation.  Recent Lexiscan MPI showed no evidence of ischemia.  Continue aggressive risk factor modification and primary prevention including apixaban in place of aspirin given recurrent DVT/PE, atorvastatin, carvedilol, ezetimibe, losartan, and as needed SL NTG.  No indication for further ischemic testing at this time.  SOB with recurrent DVT/PE: Dyspnea stable.  He remains on indefinite apixaban given recurrent DVT/PE, which is managed by his PCP.  No evidence of RV dysfunction on recent echo.  Recent echo without evidence of ischemia.  No further cardiac testing indicated at this time.  HTN: Blood pressure is reasonably controlled.  We will  reduce his carvedilol given bradycardia to see if this improves his intermittent unsteadiness.  Otherwise, he remains on HCTZ and losartan.  HLD: LDL 188 in 03/2022.  Now back on atorvastatin and ezetimibe.  Will need fasting lipid panel and LFT in the near future.  Palpitations/unsteadiness: Zio patch showed sinus rhythm with 1 run of SVT lasting 4 beats.  Patient triggered events corresponded with sinus rhythm and rare PVCs.  Reduce carvedilol to 6.25 mg twice daily to see if this improves his intermittent unsteadiness.  Otherwise, no further cardiac testing indicated.  Maintain adequate hydration.  Preoperative cardiac risk stratification: Patient will be needing to have shoulder surgery in the near future.  He is without symptoms concerning for angina or decompensation.  Recent echo demonstrated preserved LV systolic function.  Lexiscan MPI in 08/2022 showed no evidence of ischemia.  Per [facility] Activity Status Index, he can achieve greater than 4 METS without cardiac limitation.  Per Revised Cardiac Risk Index, he is low risk for noncardiac surgery, with an estimated rate of 0.9% for adverse  cardiac event in the perioperative timeframe.  From a cardiac perspective, he can proceed with noncardiac surgery without further cardiac testing at an overall low risk.  Patient's PCP indicates he will need bridging given recent and recurrent DVT/PE.  Management of this in the perioperative timeframe is deferred to them.   Disposition: F/u with [name] or an APP in 4 months.   Medication Adjustments/Labs and Tests Ordered: Current medicines are reviewed at length with the patient today.  Concerns regarding medicines are outlined above. Medication changes, Labs and Tests ordered today are summarized above and listed in the Patient Instructions accessible in Encounters.   Signed, [name], PA-C 12/03/2022 12:18 PM     [facility] [address] [place], [place]  [address] [phone]

## 2022-12-01 NOTE — Telephone Encounter (Signed)
MyChart messgae sent to patient. 

## 2022-12-03 ENCOUNTER — Ambulatory Visit: Payer: Medicare Other | Attending: Physician Assistant | Admitting: Physician Assistant

## 2022-12-03 ENCOUNTER — Encounter: Payer: Self-pay | Admitting: Physician Assistant

## 2022-12-03 VITALS — BP 120/80 | HR 56 | Ht 74.0 in | Wt 214.4 lb

## 2022-12-03 DIAGNOSIS — I200 Unstable angina: Secondary | ICD-10-CM

## 2022-12-03 DIAGNOSIS — E785 Hyperlipidemia, unspecified: Secondary | ICD-10-CM

## 2022-12-03 DIAGNOSIS — R0602 Shortness of breath: Secondary | ICD-10-CM | POA: Diagnosis not present

## 2022-12-03 DIAGNOSIS — R002 Palpitations: Secondary | ICD-10-CM

## 2022-12-03 DIAGNOSIS — I10 Essential (primary) hypertension: Secondary | ICD-10-CM | POA: Diagnosis not present

## 2022-12-03 DIAGNOSIS — Z86711 Personal history of pulmonary embolism: Secondary | ICD-10-CM

## 2022-12-03 DIAGNOSIS — I2510 Atherosclerotic heart disease of native coronary artery without angina pectoris: Secondary | ICD-10-CM | POA: Insufficient documentation

## 2022-12-03 DIAGNOSIS — Z01810 Encounter for preprocedural cardiovascular examination: Secondary | ICD-10-CM | POA: Diagnosis present

## 2022-12-03 MED ORDER — CARVEDILOL 6.25 MG PO TABS: 6.2500 mg | ORAL_TABLET | Freq: Two times a day (BID) | ORAL | 3 refills | Status: DC

## 2022-12-03 NOTE — Patient Instructions (Signed)
Medication Instructions:  Your physician has recommended you make the following change in your medication:   DECREASE Carvedilol 6.25 mg twice a day  *If you need a refill on your cardiac medications before your next appointment, please call your pharmacy*   Lab Work: None   If you have labs (blood work) drawn today and your tests are completely normal, you will receive your results only by: [place] (if you have MyChart) OR A paper copy in the mail If you have any lab test that is abnormal or we need to change your treatment, we will call you to review the results.   Testing/Procedures: None   Follow-Up: At [facility], you and your health needs are our priority.  As part of our continuing mission to provide you with exceptional heart care, we have created designated Provider Care Teams.  These Care Teams include your primary Cardiologist (physician) and Advanced Practice Providers (APPs -  Physician Assistants and Nurse Practitioners) who all work together to provide you with the care you need, when you need it.   Your next appointment:   4 month(s)  The format for your next appointment:   In Person  Provider:   Nelva Bush, MD or [name], PA-C         Important Information About Sugar

## 2023-01-03 ENCOUNTER — Encounter: Payer: Self-pay | Admitting: Internal Medicine

## 2023-01-06 ENCOUNTER — Other Ambulatory Visit: Payer: Self-pay | Admitting: Internal Medicine

## 2023-01-14 ENCOUNTER — Other Ambulatory Visit: Payer: Self-pay | Admitting: Internal Medicine

## 2023-01-14 DIAGNOSIS — M5030 Other cervical disc degeneration, unspecified cervical region: Secondary | ICD-10-CM

## 2023-01-14 DIAGNOSIS — M542 Cervicalgia: Secondary | ICD-10-CM

## 2023-01-14 DIAGNOSIS — M19012 Primary osteoarthritis, left shoulder: Secondary | ICD-10-CM

## 2023-01-14 DIAGNOSIS — M25512 Pain in left shoulder: Secondary | ICD-10-CM

## 2023-01-14 DIAGNOSIS — R519 Headache, unspecified: Secondary | ICD-10-CM

## 2023-01-24 ENCOUNTER — Ambulatory Visit (INDEPENDENT_AMBULATORY_CARE_PROVIDER_SITE_OTHER): Payer: Medicare Other | Admitting: Internal Medicine

## 2023-01-24 ENCOUNTER — Encounter: Payer: Self-pay | Admitting: Internal Medicine

## 2023-01-24 VITALS — BP 136/76 | HR 44 | Temp 97.9°F | Ht 74.0 in | Wt 216.0 lb

## 2023-01-24 DIAGNOSIS — N419 Inflammatory disease of prostate, unspecified: Secondary | ICD-10-CM

## 2023-01-24 DIAGNOSIS — K5989 Other specified functional intestinal disorders: Secondary | ICD-10-CM

## 2023-01-24 DIAGNOSIS — E785 Hyperlipidemia, unspecified: Secondary | ICD-10-CM

## 2023-01-24 DIAGNOSIS — K638219 Small intestinal bacterial overgrowth, unspecified: Secondary | ICD-10-CM

## 2023-01-24 DIAGNOSIS — N410 Acute prostatitis: Secondary | ICD-10-CM

## 2023-01-24 DIAGNOSIS — I10 Essential (primary) hypertension: Secondary | ICD-10-CM | POA: Diagnosis not present

## 2023-01-24 DIAGNOSIS — R7303 Prediabetes: Secondary | ICD-10-CM

## 2023-01-24 DIAGNOSIS — R5383 Other fatigue: Secondary | ICD-10-CM

## 2023-01-24 DIAGNOSIS — G4733 Obstructive sleep apnea (adult) (pediatric): Secondary | ICD-10-CM

## 2023-01-24 MED ORDER — ONDANSETRON HCL 4 MG PO TABS: 4.0000 mg | ORAL_TABLET | Freq: Three times a day (TID) | ORAL | 0 refills | Status: DC | PRN

## 2023-01-24 MED ORDER — ONDANSETRON HCL 4 MG PO TABS: 4.0000 mg | ORAL_TABLET | Freq: Three times a day (TID) | ORAL | 5 refills | Status: AC | PRN

## 2023-01-24 MED ORDER — CYANOCOBALAMIN 1000 MCG/ML IJ SOLN: INTRAMUSCULAR | 0 refills | Status: DC

## 2023-01-24 MED ORDER — HYDROCHLOROTHIAZIDE 12.5 MG PO CAPS: 12.5000 mg | ORAL_CAPSULE | Freq: Every day | ORAL | 0 refills | Status: DC

## 2023-01-24 NOTE — Patient Instructions (Addendum)
The book I referred to today when we were discussing your GI issues is Dr Claris Pong Bennett's book "AIP Diet for Beginners: A Comprehensive Guide to the Autoimmune Paleo protocol"

## 2023-01-24 NOTE — Progress Notes (Unsigned)
Subjective:  Patient ID: Brian Atkins, male    DOB: 08-Feb-1956  Age: 67 y.o. MRN: [id number]  CC: The primary encounter diagnosis was Essential hypertension. Diagnoses of Hyperlipidemia LDL goal <70, Fatigue, unspecified type, and Prediabetes were also pertinent to this visit.   HPI Brian Atkins presents for  Chief Complaint  Patient presents with   Medical Management of Chronic Issues    Yearly follow up     1) nonobstructive CAD: medically managed. Cleared for surgery  2) ILD by PFTS:  3) OSA:  using CPAP   4) HLD; on lipitor . Lipids needed last ldl 188  5) cervical spinal stenosis , left shoulder pain . Surgery by Mack Guise . Taking tizanidine  along with alterantign between [place]   6) HTN: coreg dose reduced in Dec by cardiology but he increased it on his own to 12.65 mg . Heart pounding   7):  cc recurrent GI issues.  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  Last testing in August. Was referred to SIBO specialist a [facility] PA and  appt was given in April  Distension and bloating has been persistent,   no change with cymbalta  20 mg which he started several months ago.   Very frustrated due to gas and constipation.  Linzess caused diarrhea.  Usinng motegrity as needed for constiption.   Outpatient Medications Prior to Visit  Medication Sig Dispense Refill   atorvastatin (LIPITOR) 20 MG tablet Take 1 tablet (20 mg total) by mouth daily. 90 tablet 0   carvedilol (COREG) 6.25 MG tablet Take 1 tablet (6.25 mg total) by mouth 2 (two) times daily. 180 tablet 3   DULoxetine (CYMBALTA) 20 MG capsule Take 20 mg by mouth daily.     ELIQUIS 5 MG TABS tablet TAKE ONE TABLET BY MOUTH TWICE A DAY 60 tablet 5   eszopiclone 3 MG TABS Take 1 tablet (3 mg total) by mouth at bedtime. Alternating with ambien 15 tablet 5   ezetimibe (ZETIA) 10 MG tablet Take 1 tablet by mouth daily.     famotidine (PEPCID) 20 MG tablet Take 20 mg by mouth 2 (two) times daily.      furosemide (LASIX) 40 MG tablet Take 40 mg by mouth as needed.     losartan (COZAAR) 100 MG tablet TAKE ONE TABLET BY MOUTH ONE TIME DAILY 90 tablet 2   Multiple Vitamins-Minerals (MULTIVITAMIN WITH MINERALS) tablet Take 1 tablet by mouth daily.     mupirocin ointment (BACTROBAN) 2 % Apply 1 application. topically 2 (two) times daily. In nose x 5 days 30 g 0   nitroGLYCERIN (NITROSTAT) 0.4 MG SL tablet Place 1 tablet (0.4 mg total) under the tongue every 5 (five) minutes as needed for chest pain. Maximum of 3 doses. 35 tablet 3   omeprazole (PRILOSEC) 40 MG capsule Take 40 mg by mouth daily.     ONETOUCH VERIO test strip USE TO CHECK BLOOD SUGAR AS NEEDED 100 strip 2   prochlorperazine (COMPAZINE) 5 MG tablet Take 5 mg by mouth every 6 (six) hours as needed.     Prucalopride Succinate (MOTEGRITY) 2 MG TABS Take 2 mg by mouth daily as needed (constipation).     rOPINIRole (REQUIP) 0.25 MG tablet Take 0.25 mg by mouth in the morning and at bedtime.      Syringe/Needle, Disp, (SYRINGE 3CC/25GX1") 25G X 1" 3 ML MISC Use for b12 injections 50 each 0   tiZANidine (ZANAFLEX)  4 MG tablet TAKE 1/2 TO 1 TABLET BY MOUTH AT BEDTIME AS NEEDED FOR MUSCLE SPASM 30 tablet 2   triamcinolone (NASACORT) 55 MCG/ACT AERO nasal inhaler Place 2 sprays into the nose at bedtime.     zolpidem (AMBIEN CR) 12.5 MG CR tablet TAKE ONE TABLET BY MOUTH AT BEDTIME AS NEEDED FOR SLEEP 30 tablet 5   cyanocobalamin (,VITAMIN B-12,) 1000 MCG/ML injection INJECT 1ML INTO THE MUSCLE EVERY 30 DAYS 10 mL 0   hydrochlorothiazide (MICROZIDE) 12.5 MG capsule TAKE ONE CAPSULE BY MOUTH ONE TIME DAILY 90 capsule 0   ondansetron (ZOFRAN) 4 MG tablet Take 1 tablet by mouth every 8 (eight) hours as needed.     enoxaparin (LOVENOX) 100 MG/ML injection Inject 1 mL (100 mg total) into the skin every 12 (twelve) hours. (Patient not taking: Reported on 12/03/2022) 6 mL 0   No facility-administered medications prior to visit.    Review of  Systems;  Patient denies headache, fevers, malaise, unintentional weight loss, skin rash, eye pain, sinus congestion and sinus pain, sore throat, dysphagia,  hemoptysis , cough, dyspnea, wheezing, chest pain, palpitations, orthopnea, edema, abdominal pain, nausea, melena, diarrhea, constipation, flank pain, dysuria, hematuria, urinary  Frequency, nocturia, numbness, tingling, seizures,  Focal weakness, Loss of consciousness,  Tremor, insomnia, depression, anxiety, and suicidal ideation.      Objective:  BP 136/76   Pulse (!) 44   Temp 97.9 F (36.6 C) (Oral)   Ht '6\' 2"'$  (1.88 m)   Wt 216 lb (98 kg)   SpO2 98%   BMI 27.73 kg/m   BP Readings from Last 3 Encounters:  01/24/23 136/76  12/03/22 120/80  11/08/22 138/83    Wt Readings from Last 3 Encounters:  01/24/23 216 lb (98 kg)  12/03/22 214 lb 6 oz (97.2 kg)  11/08/22 213 lb 6.4 oz (96.8 kg)    Physical Exam  Lab Results  Component Value Date   HGBA1C 6.0 04/07/2022   HGBA1C 6.0 09/23/2020   HGBA1C 5.8 03/10/2020    Lab Results  Component Value Date   CREATININE 1.01 08/19/2022   CREATININE 0.89 03/30/2022   CREATININE 0.96 03/29/2022    Lab Results  Component Value Date   WBC 11.9 (H) 08/19/2022   HGB 15.2 08/19/2022   HCT 46.3 08/19/2022   PLT 393 08/19/2022   GLUCOSE 129 (H) 08/19/2022   CHOL 253 (H) 04/07/2022   TRIG 225.0 (H) 04/07/2022   HDL 33.00 (L) 04/07/2022   LDLDIRECT 188.0 04/07/2022   LDLCALC 77 07/22/2021   ALT 14 08/19/2022   AST 15 08/19/2022   NA 142 08/19/2022   K 3.8 08/19/2022   CL 108 08/19/2022   CREATININE 1.01 08/19/2022   BUN 23 08/19/2022   CO2 26 08/19/2022   TSH 1.46 04/07/2022   PSA 4.76 (H) 04/07/2022   INR 1.0 03/28/2022   HGBA1C 6.0 04/07/2022   MICROALBUR 0.5 10/24/2020    NM Myocar Multi W/Spect W/Wall Motion / EF  Result Date: 08/25/2022   The study is low risk.   No ST deviation was noted.   Left ventricular function is normal. calculated LVEF = 76%   There is  no evidence for ischemia.   There is no significant coronary calcification    Assessment & Plan:  .Essential hypertension -     Comprehensive metabolic panel -     Microalbumin / creatinine urine ratio  Hyperlipidemia LDL goal <70 -     Lipid panel -  LDL cholesterol, direct  Fatigue, unspecified type -     TSH -     CBC with Differential/Platelet  Prediabetes -     Hemoglobin A1c  Other orders -     Cyanocobalamin; INJECT 1ML INTO THE MUSCLE EVERY 30 DAYS  Dispense: 10 mL; Refill: 0 -     hydroCHLOROthiazide; Take 1 capsule (12.5 mg total) by mouth daily.  Dispense: 90 capsule; Refill: 0 -     Ondansetron HCl; Take 1 tablet (4 mg total) by mouth every 8 (eight) hours as needed.  Dispense: 20 tablet; Refill: 0     I provided 30 minutes of face-to-face time during this encounter reviewing patient's last visit with me, patient's  most recent visit with cardiology,  nephrology,  and neurology,  recent surgical and non surgical procedures, previous  labs and imaging studies, counseling on currently addressed issues,  and post visit ordering to diagnostics and therapeutics .   Follow-up: No follow-ups on file.   [name], MD

## 2023-01-25 DIAGNOSIS — K5989 Other specified functional intestinal disorders: Secondary | ICD-10-CM | POA: Insufficient documentation

## 2023-01-25 LAB — COMPREHENSIVE METABOLIC PANEL
ALT: 13 U/L (ref 0–53)
AST: 15 U/L (ref 0–37)
Albumin: 4.4 g/dL (ref 3.5–5.2)
Alkaline Phosphatase: 71 U/L (ref 39–117)
BUN: 16 mg/dL (ref 6–23)
CO2: 27 mEq/L (ref 19–32)
Calcium: 9.7 mg/dL (ref 8.4–10.5)
Chloride: 104 mEq/L (ref 96–112)
Creatinine, Ser: 1.02 mg/dL (ref 0.40–1.50)
GFR: 76.64 mL/min (ref >60.00)
Glucose, Bld: 81 mg/dL (ref 70–99)
Potassium: 3.8 mEq/L (ref 3.5–5.1)
Sodium: 142 mEq/L (ref 135–145)
Total Bilirubin: 0.3 mg/dL (ref 0.2–1.2)
Total Protein: 7.5 g/dL (ref 6.0–8.3)

## 2023-01-25 LAB — CBC WITH DIFFERENTIAL/PLATELET
Basophils Absolute: 0.1 10*3/uL (ref 0.0–0.1)
Basophils Relative: 0.6 % (ref 0.0–3.0)
Eosinophils Absolute: 0.2 10*3/uL (ref 0.0–0.7)
Eosinophils Relative: 2.8 % (ref 0.0–5.0)
HCT: 43.7 % (ref 39.0–52.0)
Hemoglobin: 15 g/dL (ref 13.0–17.0)
Lymphocytes Relative: 37.1 % (ref 12.0–46.0)
Lymphs Abs: 3.2 10*3/uL (ref 0.7–4.0)
MCHC: 34.2 g/dL (ref 30.0–36.0)
MCV: 91.2 fl (ref 78.0–100.0)
Monocytes Absolute: 1 10*3/uL (ref 0.1–1.0)
Monocytes Relative: 12 % (ref 3.0–12.0)
Neutro Abs: 4.1 10*3/uL (ref 1.4–7.7)
Neutrophils Relative %: 47.5 % (ref 43.0–77.0)
Platelets: 405 10*3/uL — ABNORMAL HIGH (ref 150.0–400.0)
RBC: 4.79 Mil/uL (ref 4.22–5.81)
RDW: 13.9 % (ref 11.5–15.5)
WBC: 8.6 10*3/uL (ref 4.0–10.5)

## 2023-01-25 LAB — MICROALBUMIN / CREATININE URINE RATIO
Creatinine,U: 60.2 mg/dL
Microalb Creat Ratio: 1.2 mg/g (ref 0.0–30.0)
Microalb, Ur: <0.7 mg/dL (ref 0.0–1.9)

## 2023-01-25 LAB — LIPID PANEL
Cholesterol: 250 mg/dL — ABNORMAL HIGH (ref 0–200)
HDL: 33.8 mg/dL — ABNORMAL LOW (ref >39.00)
NonHDL: 215.9
Total CHOL/HDL Ratio: 7
Triglycerides: 234 mg/dL — ABNORMAL HIGH (ref 0.0–149.0)
VLDL: 46.8 mg/dL — ABNORMAL HIGH (ref 0.0–40.0)

## 2023-01-25 LAB — LDL CHOLESTEROL, DIRECT: Direct LDL: 173 mg/dL

## 2023-01-25 LAB — TSH: TSH: 1.75 u[IU]/mL (ref 0.35–5.50)

## 2023-01-25 LAB — HEMOGLOBIN A1C: Hgb A1c MFr Bld: 6 % (ref 4.6–6.5)

## 2023-01-25 NOTE — Assessment & Plan Note (Signed)
Agnosed by [facility] following his last treatement for SIBO in June 2023 with rifaxamin.  He has seen no improvement with duloxetine 20 mg dose ans is scheduled to see a "specialist " in April.  His life has been quite disrupted by his recurrent abdominal symptoms and he is requesting a second opinion.  I have recommended that he consider an elimination diet to rule out food intolerances and recommended  Dr Claris Pong Bennett's book "AIP Diet for Beginners: A Comprehensive Guide to the Autoimmune Paleo protocol"

## 2023-01-25 NOTE — Assessment & Plan Note (Signed)
Diagnosed by sleep study. he is wearing  CPAP every night .  Strongly advised to bring his CPAP with him for his upcoming orthopedi c surgery

## 2023-01-25 NOTE — Assessment & Plan Note (Signed)
Goal is LDL < 70  Given calcium score of 489 (86%).  Currently taking zetia 10 mg and rosuvastatin with recent increase in dose.  Repeat lipids are pending  Lab Results  Component Value Date   CHOL 253 (H) 04/07/2022   HDL 33.00 (L) 04/07/2022   LDLCALC 77 07/22/2021   LDLDIRECT 188.0 04/07/2022   TRIG 225.0 (H) 04/07/2022   CHOLHDL 8 04/07/2022

## 2023-01-26 ENCOUNTER — Encounter: Payer: Self-pay | Admitting: Urology

## 2023-01-26 ENCOUNTER — Ambulatory Visit (INDEPENDENT_AMBULATORY_CARE_PROVIDER_SITE_OTHER): Payer: Medicare Other | Admitting: Urology

## 2023-01-26 VITALS — BP 136/81 | HR 64 | Ht 73.0 in | Wt 250.0 lb

## 2023-01-26 DIAGNOSIS — R3129 Other microscopic hematuria: Secondary | ICD-10-CM | POA: Diagnosis not present

## 2023-01-26 DIAGNOSIS — N5201 Erectile dysfunction due to arterial insufficiency: Secondary | ICD-10-CM

## 2023-01-26 DIAGNOSIS — R972 Elevated prostate specific antigen [PSA]: Secondary | ICD-10-CM | POA: Diagnosis not present

## 2023-01-26 DIAGNOSIS — Z87898 Personal history of other specified conditions: Secondary | ICD-10-CM

## 2023-01-26 LAB — URINALYSIS, COMPLETE
Bilirubin, UA: NEGATIVE
Glucose, UA: NEGATIVE
Ketones, UA: NEGATIVE
Leukocytes,UA: NEGATIVE
Nitrite, UA: NEGATIVE
Protein,UA: NEGATIVE
Specific Gravity, UA: 1.015 (ref 1.005–1.030)
Urobilinogen, Ur: 0.2 mg/dL (ref 0.2–1.0)
pH, UA: 6 (ref 5.0–7.5)

## 2023-01-26 LAB — MICROSCOPIC EXAMINATION

## 2023-01-26 NOTE — Progress Notes (Signed)
01/26/2023 2:18 PM   Brian Atkins [birth year] [id number]  Referring provider: [name], MD [place],  [place] [address]  Chief Complaint  Patient presents with   Recurrent UTI    Urologic history:  1.  Microhematuria UA 05/08/2022 and 06/08/2022 with 3-10 RBC Prior CT abdomen pelvis with contrast showed no upper tract abnormalities Cystoscopy 07/26/2022 prominent lateral lobe enlargement w/ hypervascularity/friability  2.  History elevated PSA PSA 04/07/2022 4.76; repeat PSA June 2023 back to baseline at 2.8  3.  Erectile dysfunction   HPI: 67 y.o. male presents for 6 month follow-up.  Post cystoscopy UTI August 2023 with culture + Enterococcus.  Symptoms resolved with antibiotics.  No problems since that time No bothersome LUTS Denies dysuria, gross hematuria Denies flank, abdominal or pelvic pain He has not tried the tadalafil and wanted to make sure there were no contraindications to him taking   PMH: Past Medical History:  Diagnosis Date   Complication of anesthesia    slow to wake   Coronary artery disease, non-occlusive    a. LHC 12/18: ostLAD 20%, p/mLAD 60% FFR 0.84, ost ramus 50%, mRCA 50% FFR 0.94, EF 65%   COVID-19 virus infection 12/28/2019   Diverticulitis    Diverticulosis    Dyslipidemia    Dyspnea    Family history of adverse reaction to anesthesia    Mother - slow to wake   GERD (gastroesophageal reflux disease)    Hemorrhoids    Hiatal hernia    History of 2019 novel coronavirus disease (COVID-19) 12/24/2019   History of echocardiogram    a. TTE 1/19: EF of 60-65%, normal wall motion, normal LV diastolic function, mildly dilated LA   History of kidney stones    Labile hypertension    Lymphoma (HCC)    Medial epicondylitis of right elbow    Meralgia paresthetica of right side    Microscopic hematuria    Ocular migraine    Pneumonia    Sleep apnea    CPAP    Surgical History: Past Surgical History:   Procedure Laterality Date   BRONCHIAL WASHINGS N/A 08/21/2021   Procedure: BRONCHIAL WASHINGS;  Surgeon: Brian Glazier, MD;  Location: [facility];  Service: Thoracic;  Laterality: N/A;   COLONOSCOPY     FLEXIBLE BRONCHOSCOPY N/A 08/21/2021   Procedure: FLEXIBLE BRONCHOSCOPY;  Surgeon: Brian Glazier, MD;  Location: [facility];  Service: Thoracic;  Laterality: N/A;   INTRAVASCULAR PRESSURE WIRE/FFR STUDY N/A 12/07/2017   Procedure: INTRAVASCULAR PRESSURE WIRE/FFR STUDY;  Surgeon: Brian Bush, MD;  Location: [place] [facility];  Service: Cardiovascular;  Laterality: N/A;   LEFT HEART CATH AND CORONARY ANGIOGRAPHY Left 12/07/2017   Procedure: LEFT HEART CATH AND CORONARY ANGIOGRAPHY;  Surgeon: Brian Bush, MD;  Location: [place] [facility];  Service: Cardiovascular;  Laterality: Left;   nissen funduplication  1638   Brian Atkins   PULMONARY THROMBECTOMY N/A 03/29/2022   Procedure: PULMONARY THROMBECTOMY;  Surgeon: Brian Huxley, MD;  Location: [place] [facility];  Service: Cardiovascular;  Laterality: N/A;   SEPTOPLASTY Bilateral 08/23/2019   Procedure: SEPTOPLASTY;  Surgeon: Brian Sheffield, MD;  Location: [place];  Service: ENT;  Laterality: Bilateral;   SPLENECTOMY     TURBINATE REDUCTION Bilateral 08/23/2019   Procedure: TURBINATE REDUCTION;  Surgeon: Brian Sheffield, MD;  Location: [place];  Service: ENT;  Laterality: Bilateral;  Latex sensitivity sleep apnea    Home Medications:  Allergies as of 01/26/2023  Reactions   Avelox [moxifloxacin] Hives, Swelling   Severe facial swelling   Gadolinium Derivatives Swelling   (OK with pre-medication) (OK with pre-medication)   Contrast Media [iodinated Contrast Media] Swelling   (OK with pre-medication)   Crestor [rosuvastatin Calcium]    myalgia   Morphine And Related Itching   Niacin    Other reaction(s): Unknown   Niacin-simvastatin Er    myalgia   Latex Rash   Gloves cause raxh   Tape Dermatitis    Tapentadol Dermatitis   Wound Dressing Adhesive Dermatitis        Medication List        Accurate as of January 26, 2023  2:18 PM. If you have any questions, ask your nurse or doctor.          atorvastatin 20 MG tablet Commonly known as: LIPITOR Take 1 tablet (20 mg total) by mouth daily.   carvedilol 6.25 MG tablet Commonly known as: COREG Take 1 tablet (6.25 mg total) by mouth 2 (two) times daily.   cyanocobalamin 1000 MCG/ML injection Commonly known as: VITAMIN B12 INJECT 1ML INTO THE MUSCLE EVERY 30 DAYS   DULoxetine 20 MG capsule Commonly known as: CYMBALTA Take 20 mg by mouth daily.   Eliquis 5 MG Tabs tablet Generic drug: apixaban TAKE ONE TABLET BY MOUTH TWICE A DAY   enoxaparin 100 MG/ML injection Commonly known as: Lovenox Inject 1 mL (100 mg total) into the skin every 12 (twelve) hours.   Eszopiclone 3 MG Tabs Commonly known as: eszopiclone Take 1 tablet (3 mg total) by mouth at bedtime. Alternating with ambien   ezetimibe 10 MG tablet Commonly known as: ZETIA Take 1 tablet by mouth daily.   famotidine 20 MG tablet Commonly known as: PEPCID Take 20 mg by mouth 2 (two) times daily.   furosemide 40 MG tablet Commonly known as: LASIX Take 40 mg by mouth as needed.   hydrochlorothiazide 12.5 MG capsule Commonly known as: MICROZIDE Take 1 capsule (12.5 mg total) by mouth daily.   losartan 100 MG tablet Commonly known as: COZAAR TAKE ONE TABLET BY MOUTH ONE TIME DAILY   Motegrity 2 MG Tabs Generic drug: Prucalopride Succinate Take 2 mg by mouth daily as needed (constipation).   multivitamin with minerals tablet Take 1 tablet by mouth daily.   mupirocin ointment 2 % Commonly known as: BACTROBAN Apply 1 application. topically 2 (two) times daily. In nose x 5 days   nitroGLYCERIN 0.4 MG SL tablet Commonly known as: NITROSTAT Place 1 tablet (0.4 mg total) under the tongue every 5 (five) minutes as needed for chest pain. Maximum of 3  doses.   omeprazole 40 MG capsule Commonly known as: PRILOSEC Take 40 mg by mouth daily.   ondansetron 4 MG tablet Commonly known as: ZOFRAN Take 1 tablet (4 mg total) by mouth every 8 (eight) hours as needed.   OneTouch Verio test strip Generic drug: glucose blood USE TO CHECK BLOOD SUGAR AS NEEDED   prochlorperazine 5 MG tablet Commonly known as: COMPAZINE Take 5 mg by mouth every 6 (six) hours as needed.   rOPINIRole 0.25 MG tablet Commonly known as: REQUIP Take 0.25 mg by mouth in the morning and at bedtime.   SYRINGE 3CC/25GX1" 25G X 1" 3 ML Misc Use for b12 injections   tiZANidine 4 MG tablet Commonly known as: ZANAFLEX TAKE 1/2 TO 1 TABLET BY MOUTH AT BEDTIME AS NEEDED FOR MUSCLE SPASM   triamcinolone 55 MCG/ACT Aero nasal inhaler Commonly known  as: NASACORT Place 2 sprays into the nose at bedtime.   zolpidem 12.5 MG CR tablet Commonly known as: AMBIEN CR TAKE ONE TABLET BY MOUTH AT BEDTIME AS NEEDED FOR SLEEP        Allergies:  Allergies  Allergen Reactions   Avelox [Moxifloxacin] Hives and Swelling    Severe facial swelling   Gadolinium Derivatives Swelling    (OK with pre-medication) (OK with pre-medication)   Contrast Media [Iodinated Contrast Media] Swelling    (OK with pre-medication)   Crestor [Rosuvastatin Calcium]     myalgia   Morphine And Related Itching   Niacin     Other reaction(s): Unknown   Niacin-Simvastatin Er     myalgia   Latex Rash    Gloves cause raxh   Tape Dermatitis   Tapentadol Dermatitis   Wound Dressing Adhesive Dermatitis    Family History: Family History  Problem Relation Age of Onset   Coronary artery disease Father 80       CABG   Aortic aneurysm Father 20       repaired   Hyperlipidemia Father    Lung cancer Mother    Hypertension Mother    Cancer Mother        bladder   COPD Mother    Stomach cancer Maternal Grandfather     Social History:  reports that he has never smoked. He has been exposed to  tobacco smoke. He has never used smokeless tobacco. He reports that he does not drink alcohol and does not use drugs.   Physical Exam: BP 136/81   Pulse 64   Ht '6\' 1"'$  (1.854 m)   Wt 250 lb (113.4 kg)   BMI 32.98 kg/m   Constitutional:  Alert and oriented, No acute distress. HEENT: [place] AT Respiratory: Normal respiratory effort, no increased work of breathing. Psychiatric: Normal mood and affect.  Laboratory Data:  Urinalysis Dipstick trace blood Microscopy 3-10 RBC   Assessment & Plan:    1. Microhematuria Stable  2.  History elevated PSA Due for annual PSA June 2024 and he will have drawn with PCP  3.  Erectile dysfunction He has sublingual NTG on his med list but has never utilized.  He was informed there is no contraindication to a PDE 5 inhibitor however he should not take nitroglycerin if he has taken the tadalafil within 24 hours  He has annual UA and and PSA with Dr. Derrel Nip and will return here as needed for increasing amount of RBCs in the urine (>10 RBCs) or increasing PSA   [name], MD  [place] [address], [place], [place] [address] [phone]

## 2023-01-27 ENCOUNTER — Encounter: Payer: Self-pay | Admitting: Urology

## 2023-02-07 ENCOUNTER — Ambulatory Visit: Payer: Self-pay

## 2023-02-07 ENCOUNTER — Ambulatory Visit (INDEPENDENT_AMBULATORY_CARE_PROVIDER_SITE_OTHER): Payer: Medicare Other

## 2023-02-07 ENCOUNTER — Ambulatory Visit (INDEPENDENT_AMBULATORY_CARE_PROVIDER_SITE_OTHER): Payer: Medicare Other | Admitting: Family

## 2023-02-07 ENCOUNTER — Encounter: Payer: Self-pay | Admitting: Family

## 2023-02-07 VITALS — BP 132/70 | HR 78 | Temp 98.0°F | Ht 74.0 in | Wt 210.6 lb

## 2023-02-07 DIAGNOSIS — J40 Bronchitis, not specified as acute or chronic: Secondary | ICD-10-CM | POA: Diagnosis not present

## 2023-02-07 DIAGNOSIS — R051 Acute cough: Secondary | ICD-10-CM | POA: Diagnosis not present

## 2023-02-07 LAB — POC COVID19 BINAXNOW: SARS Coronavirus 2 Ag: NEGATIVE

## 2023-02-07 LAB — POCT INFLUENZA A/B
Influenza A, POC: NEGATIVE
Influenza B, POC: NEGATIVE

## 2023-02-07 MED ORDER — ALBUTEROL SULFATE HFA 108 (90 BASE) MCG/ACT IN AERS: 2.0000 | INHALATION_SPRAY | Freq: Four times a day (QID) | RESPIRATORY_TRACT | 0 refills | Status: DC | PRN

## 2023-02-07 MED ORDER — HYDROCOD POLI-CHLORPHE POLI ER 10-8 MG/5ML PO SUER: 5.0000 mL | Freq: Every evening | ORAL | 0 refills | Status: DC | PRN

## 2023-02-07 NOTE — Patient Instructions (Addendum)
As discussed, please do not take Ambien or Lunesta while on codeine-based cough syrup due to risk of excessive sedation  Use albuterol every 6 hours for first 24 hours to get good medication into the lungs and loosen congestion; after, you may use as needed and eventually stop all together when cough resolves.  I provided you with tussionex cough syrup.   Please take cough medication at night only as needed. As we discussed, I do not recommend dosing throughout the day as coughing is a protective mechanism . It also helps to break up thick mucous.  Do not take cough suppressants with alcohol as can lead to trouble breathing. Advise caution if taking cough suppressant and operating machinery ( i.e driving a car) as you may feel very tired.

## 2023-02-07 NOTE — Progress Notes (Signed)
Assessment & Plan:  Bronchitis Assessment & Plan: Afebrile and in no acute respiratory distress.  He is flu and COVID-negative today.  Discussed more likely viral URI based on duration. He has started augmentin.  Pending chest x-ray to ensure no associated pneumonia.  Otherwise advised him to complete Augmentin.  Provided him with a codeine-based syrup to see if more effective for him.  Advised him to start a albuterol inhaler.  Orders: -     Hydrocod Poli-Chlorphe Poli ER; Take 5 mLs by mouth at bedtime as needed for cough.  Dispense: 40 mL; Refill: 0 -     Albuterol Sulfate HFA; Inhale 2 puffs into the lungs every 6 (six) hours as needed for wheezing or shortness of breath.  Dispense: 8 g; Refill: 0 -     DG Chest 2 View; Future  Acute cough -     POC COVID-19 BinaxNow -     POCT Influenza A/B     Return precautions given.   Risks, benefits, and alternatives of the medications and treatment plan prescribed today were discussed, and patient expressed understanding.   Education regarding symptom management and diagnosis given to patient on AVS either electronically or printed.  No follow-ups on file.  Mable Paris, FNP  Subjective:    Patient ID: Brian Atkins, male    DOB: 1956-09-08, 67 y.o.   MRN: [id number]  CC: Brian Atkins is a 67 y.o. male who presents today for an acute visit.    HPI: Complains of  nasal congestion, chills, productive cough x 4 days ago  He called Dr Maris Berger ENT who sent in augmentin ; there was not an associated visit.   Started augmentin 3 days ago. Fever and chills have resolved since starting augmentin. He is concerned as cough is no longer getting up thick mucous.   He is taking hydrocodone homatropine 5-1.56m without relief.   He is taking tylenol.  Tmax 100.3- 100.6 without tylenol.   No cp, sob.       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   No h/o ckd  Follows with cardiology with last visit 12/03/2022 Allergies: Avelox [moxifloxacin], Gadolinium derivatives, Contrast media [iodinated contrast media], Crestor [rosuvastatin calcium], Morphine and related, Niacin, Niacin-simvastatin er, Latex, Tape, Tapentadol, and Wound dressing adhesive Current Outpatient Medications on File Prior to Visit  Medication Sig Dispense Refill   amoxicillin-clavulanate (AUGMENTIN) 875-125 MG tablet Take 1 tablet by mouth 2 (two) times daily.     atorvastatin (LIPITOR) 20 MG tablet Take 1 tablet (20 mg total) by mouth daily. 90 tablet 0   carvedilol (COREG) 6.25 MG tablet Take 1 tablet (6.25 mg total) by mouth 2 (two) times daily. 180 tablet 3   cyanocobalamin (VITAMIN B12) 1000 MCG/ML injection INJECT 1ML INTO THE MUSCLE EVERY 30 DAYS 10 mL 0   DULoxetine (CYMBALTA) 20 MG capsule Take 20 mg by mouth daily.     ELIQUIS 5 MG TABS tablet TAKE ONE TABLET BY MOUTH TWICE A DAY 60 tablet 5   enoxaparin (LOVENOX) 100 MG/ML injection Inject 1 mL (100 mg total) into the skin every 12 (twelve) hours. 6 mL 0   eszopiclone 3 MG TABS Take 1 tablet (3 mg total) by mouth at bedtime. Alternating with ambien 15 tablet 5   ezetimibe (ZETIA) 10 MG tablet Take 1 tablet by mouth daily.  famotidine (PEPCID) 20 MG tablet Take 20 mg by mouth 2 (two) times daily.     furosemide (LASIX) 40 MG tablet Take 40 mg by mouth as needed.     hydrochlorothiazide (MICROZIDE) 12.5 MG capsule Take 1 capsule (12.5 mg total) by mouth daily. 90 capsule 0   losartan (COZAAR) 100 MG tablet TAKE ONE TABLET BY MOUTH ONE TIME DAILY 90 tablet 2   Multiple Vitamins-Minerals (MULTIVITAMIN WITH MINERALS) tablet Take 1 tablet by mouth daily.     mupirocin ointment (BACTROBAN) 2 % Apply 1 application. topically 2 (two) times daily. In nose x 5 days 30 g 0   nitroGLYCERIN (NITROSTAT) 0.4 MG SL tablet Place 1 tablet (0.4 mg total)  under the tongue every 5 (five) minutes as needed for chest pain. Maximum of 3 doses. 35 tablet 3   omeprazole (PRILOSEC) 40 MG capsule Take 40 mg by mouth daily.     ondansetron (ZOFRAN) 4 MG tablet Take 1 tablet (4 mg total) by mouth every 8 (eight) hours as needed. 20 tablet 5   ONETOUCH VERIO test strip USE TO CHECK BLOOD SUGAR AS NEEDED 100 strip 2   prochlorperazine (COMPAZINE) 5 MG tablet Take 5 mg by mouth every 6 (six) hours as needed.     Prucalopride Succinate (MOTEGRITY) 2 MG TABS Take 2 mg by mouth daily as needed (constipation).     rOPINIRole (REQUIP) 0.25 MG tablet Take 0.25 mg by mouth in the morning and at bedtime.      Syringe/Needle, Disp, (SYRINGE 3CC/25GX1") 25G X 1" 3 ML MISC Use for b12 injections 50 each 0   tiZANidine (ZANAFLEX) 4 MG tablet TAKE 1/2 TO 1 TABLET BY MOUTH AT BEDTIME AS NEEDED FOR MUSCLE SPASM 30 tablet 2   triamcinolone (NASACORT) 55 MCG/ACT AERO nasal inhaler Place 2 sprays into the nose at bedtime.     zolpidem (AMBIEN CR) 12.5 MG CR tablet TAKE ONE TABLET BY MOUTH AT BEDTIME AS NEEDED FOR SLEEP 30 tablet 5   No current facility-administered medications on file prior to visit.    Review of Systems  Constitutional:  Negative for chills (resolved) and fever.  HENT:  Positive for congestion.   Respiratory:  Positive for cough. Negative for shortness of breath and wheezing.   Cardiovascular:  Negative for chest pain and palpitations.  Gastrointestinal:  Negative for nausea and vomiting.      Objective:    BP 132/70   Pulse 78   Temp 98 F (36.7 C) (Oral)   Ht 6' 2"$  (1.88 m)   Wt 210 lb 9.6 oz (95.5 kg)   SpO2 98%   BMI 27.04 kg/m   BP Readings from Last 3 Encounters:  02/07/23 132/70  01/26/23 136/81  01/24/23 136/76   Wt Readings from Last 3 Encounters:  02/07/23 210 lb 9.6 oz (95.5 kg)  01/26/23 250 lb (113.4 kg)  01/24/23 216 lb (98 kg)    Physical Exam Vitals reviewed.  Constitutional:      Appearance: He is well-developed.   HENT:     Head: Normocephalic and atraumatic.     Right Ear: Hearing, tympanic membrane, ear canal and external ear normal. No decreased hearing noted. No drainage, swelling or tenderness. No middle ear effusion. Tympanic membrane is not injected, erythematous or bulging.     Left Ear: Hearing, tympanic membrane, ear canal and external ear normal. No decreased hearing noted. No drainage, swelling or tenderness.  No middle ear effusion. Tympanic membrane is not injected, erythematous or  bulging.     Nose: Nose normal.     Right Sinus: No maxillary sinus tenderness or frontal sinus tenderness.     Left Sinus: No maxillary sinus tenderness or frontal sinus tenderness.     Mouth/Throat:     Pharynx: Uvula midline. No oropharyngeal exudate or posterior oropharyngeal erythema.     Tonsils: No tonsillar abscesses.  Eyes:     Conjunctiva/sclera: Conjunctivae normal.  Cardiovascular:     Rate and Rhythm: Regular rhythm.     Heart sounds: Normal heart sounds.  Pulmonary:     Effort: Pulmonary effort is normal. No respiratory distress.     Breath sounds: Normal breath sounds. No wheezing, rhonchi or rales.  Lymphadenopathy:     Head:     Right side of head: No submental, submandibular, tonsillar, preauricular, posterior auricular or occipital adenopathy.     Left side of head: No submental, submandibular, tonsillar, preauricular, posterior auricular or occipital adenopathy.     Cervical: No cervical adenopathy.  Skin:    General: Skin is warm and dry.  Neurological:     Mental Status: He is alert.  Psychiatric:        Speech: Speech normal.        Behavior: Behavior normal.

## 2023-02-07 NOTE — Assessment & Plan Note (Signed)
Afebrile and in no acute respiratory distress.  He is flu and COVID-negative today.  Discussed more likely viral URI based on duration. He has started augmentin.  Pending chest x-ray to ensure no associated pneumonia.  Otherwise advised him to complete Augmentin.  Provided him with a codeine-based syrup to see if more effective for him.  Advised him to start a albuterol inhaler.

## 2023-02-14 ENCOUNTER — Telehealth: Payer: Self-pay | Admitting: Internal Medicine

## 2023-02-14 ENCOUNTER — Encounter
Admission: RE | Admit: 2023-02-14 | Discharge: 2023-02-14 | Disposition: A | Discharge status: Home / Self Care | Payer: Medicare Other | Source: Ambulatory Visit | Attending: Orthopedic Surgery | Admitting: Orthopedic Surgery

## 2023-02-14 NOTE — Patient Instructions (Addendum)
Your procedure is scheduled on:02-22-23 Tuesday Report to the Registration Desk on the 1st floor of the [facility].Then proceed to the 2nd floor Surgery Desk To find out your arrival time, please call [phone] between 1PM - 3PM on:02-21-23 Monday If your arrival time is 6:00 am, do not arrive before that time as the [facility] entrance doors do not open until 6:00 am.  REMEMBER: Instructions that are not followed completely may result in serious medical risk, up to and including death; or upon the discretion of your surgeon and anesthesiologist your surgery may need to be rescheduled.  Do not eat food after midnight the night before surgery.  No gum chewing or hard candies.  You may however, drink CLEAR liquids up to 2 hours before you are scheduled to arrive for your surgery. Do not drink anything within 2 hours of your scheduled arrival time.  Clear liquids include: - water  - apple juice without pulp - gatorade (not RED colors) - black coffee or tea (Do NOT add milk or creamers to the coffee or tea) Do NOT drink anything that is not on this list.  One week prior to surgery: Stop Anti-inflammatories (NSAIDS) such as Advil, Aleve, Ibuprofen, Motrin, Naproxen, Naprosyn and Aspirin based products such as Excedrin, Goody's Powder, BC Powder.You may however, take Tylenol if needed for pain up until the day of surgery.  Stop ANY OVER THE COUNTER supplements/vitamins NOW (02-14-23) until after surgery (Multivitamin)  Your surgeons/cardiologist office will reach out to you about when you need to stop your [place] WITH A SIP OF WATER: -atorvastatin (LIPITOR)  -carvedilol (COREG)  -ezetimibe (ZETIA)  -famotidine (PEPCID)  -omeprazole (PRILOSEC)  -rOPINIRole (REQUIP)   No Alcohol for 24 hours before or after surgery.  No Smoking including e-cigarettes for 24 hours before surgery.  No chewable tobacco products for at least 6 hours  before surgery.  No nicotine patches on the day of surgery.  Do not use any "recreational" drugs for at least a week (preferably 2 weeks) before your surgery.  Please be advised that the combination of cocaine and anesthesia may have negative outcomes, up to and including death. If you test positive for cocaine, your surgery will be cancelled.  On the morning of surgery brush your teeth with toothpaste and water, you may rinse your mouth with mouthwash if you wish. Do not swallow any toothpaste or mouthwash.  Use CHG Soap as directed on instruction sheet.  Do not wear jewelry, make-up, hairpins, clips or nail polish.  Do not wear lotions, powders, or perfumes.   Do not shave body hair from the neck down 48 hours before surgery.  Contact lenses, hearing aids and dentures may not be worn into surgery.  Do not bring valuables to the hospital. [facility] is not responsible for any missing/lost belongings or valuables.   Bring your C-PAP to the hospital   Notify your doctor if there is any change in your medical condition (cold, fever, infection).  Wear comfortable clothing (specific to your surgery type) to the hospital.  After surgery, you can help prevent lung complications by doing breathing exercises.  Take deep breaths and cough every 1-2 hours. Your doctor may order a device called an Incentive Spirometer to help you take deep breaths. When coughing or sneezing, hold a pillow firmly against your incision with both hands. This is called "splinting." Doing this helps protect your incision. It also decreases belly discomfort.  If you are being admitted to the hospital overnight, leave your suitcase in the car. After surgery it may be brought to your room.  In case of increased patient census, it may be necessary for you, the patient, to continue your postoperative care in the Same Day Surgery department.  If you are being discharged the day of surgery, you will not be allowed to  drive home. You will need a responsible individual to drive you home and stay with you for 24 hours after surgery.   If you are taking public transportation, you will need to have a responsible individual with you.  Please call the [facility] Dept. at [phone] if you have any questions about these instructions.  Surgery Visitation Policy:  Patients undergoing a surgery or procedure may have two family members or support persons with them as long as the person is not COVID-19 positive or experiencing its symptoms.   Due to an increase in RSV and influenza rates and associated hospitalizations, children ages 26 and under will not be able to visit patients in [facility]. Masks continue to be strongly recommended.     Preparing for Surgery with CHLORHEXIDINE GLUCONATE (CHG) Soap  Chlorhexidine Gluconate (CHG) Soap  o An antiseptic cleaner that kills germs and bonds with the skin to continue killing germs even after washing  o Used for showering the night before surgery and morning of surgery  Before surgery, you can play an important role by reducing the number of germs on your skin.  CHG (Chlorhexidine gluconate) soap is an antiseptic cleanser which kills germs and bonds with the skin to continue killing germs even after washing.  Please do not use if you have an allergy to CHG or antibacterial soaps. If your skin becomes reddened/irritated stop using the CHG.  1. Shower the NIGHT BEFORE SURGERY and the MORNING OF SURGERY with CHG soap.  2. If you choose to wash your hair, wash your hair first as usual with your normal shampoo.  3. After shampooing, rinse your hair and body thoroughly to remove the shampoo.  4. Use CHG as you would any other liquid soap. You can apply CHG directly to the skin and wash gently with a scrungie or a clean washcloth.  5. Apply the CHG soap to your body only from the neck down. Do not use on open wounds or open sores. Avoid  contact with your eyes, ears, mouth, and genitals (private parts). Wash face and genitals (private parts) with your normal soap.  6. Wash thoroughly, paying special attention to the area where your surgery will be performed.  7. Thoroughly rinse your body with warm water.  8. Do not shower/wash with your normal soap after using and rinsing off the CHG soap.  9. Pat yourself dry with a clean towel.  10. Wear clean pajamas to bed the night before surgery.  12. Place clean sheets on your bed the night of your first shower and do not sleep with pets.  13. Shower again with the CHG soap on the day of surgery prior to arriving at the hospital.  14. Do not apply any deodorants/lotions/powders.  15. Please wear clean clothes to the hospital.

## 2023-02-14 NOTE — Telephone Encounter (Signed)
   Patient Name: Brian Atkins  DOB: 1956/07/05 MRN: [id number]  Primary Cardiologist: Nelva Bush, MD  Chart reviewed as part of pre-operative protocol coverage. Patient was seen by [name], PA-C, on 12/03/2022 and mentioned need for upcoming shoulder surgery. Per [name]'s note: "Patient will be needing to have shoulder surgery in the near future.  He is without symptoms concerning for angina or decompensation.  Recent echo demonstrated preserved LV systolic function.  Lexiscan MPI in 08/2022 showed no evidence of ischemia.  Per [facility] Activity Status Index, he can achieve greater than 4 METS without cardiac limitation.  Per Revised Cardiac Risk Index, he is low risk for noncardiac surgery, with an estimated rate of 0.9% for adverse cardiac event in the perioperative timeframe.  From a cardiac perspective, he can proceed with noncardiac surgery without further cardiac testing at an overall low risk.  Patient's PCP indicates he will need bridging given recent and recurrent DVT/PE.  Management of this in the perioperative timeframe is deferred to them."  I will route this recommendation to the requesting party via [facility] fax function and remove from pre-op pool.  Please call with questions.  [name], PA-C 02/14/2023, 4:14 PM

## 2023-02-14 NOTE — Progress Notes (Signed)
Pt states that he had called over to his surgeons office about when to stop his Eliquis and was told to call his cardiologist. He called over to Dr Ends office and they told him they needed a form sent from surgeons office. Pt called back over to [facility] at Dr Marthann Schiller office and form was sent and pt was told someone would call him to let him know when pt needs to stop his Eliquis

## 2023-02-14 NOTE — Telephone Encounter (Signed)
   Pre-operative Risk Assessment    Patient Name: Brian Atkins  DOB: 1956/11/24 MRN: [id number]      Request for Surgical Clearance    Procedure:   Shoulder Scope  Date of Surgery:  Clearance 02/22/23                                 Surgeon:  Dr. Mack Guise Surgeon's Group or Practice Name:  [facility] Phone number:  [phone] Ext. [place] Fax number:  [phone]   Type of Clearance Requested:   - Medical  - Pharmacy:  Hold Apixaban (Eliquis) TBD by cardiologist   Type of Anesthesia:  General    Additional requests/questions:  Please fax a copy of medical clearance to the surgeon's office.  [name]   02/14/2023, 3:33 PM

## 2023-02-15 ENCOUNTER — Telehealth: Payer: Self-pay | Admitting: Internal Medicine

## 2023-02-15 ENCOUNTER — Other Ambulatory Visit: Payer: Self-pay | Admitting: Orthopedic Surgery

## 2023-02-15 NOTE — Telephone Encounter (Addendum)
   Patient Name: Brian Atkins  DOB: 1956-06-30 MRN: [id number]  Primary Cardiologist: Nelva Bush, MD  Chart reviewed as part of pre-operative protocol coverage. This clearance was already addressed by Sande Rives on 2/26:   Chart reviewed as part of pre-operative protocol coverage. Patient was seen by [name], PA-C, on 12/03/2022 and mentioned need for upcoming shoulder surgery. Per [name]'s note: "Patient will be needing to have shoulder surgery in the near future.  He is without symptoms concerning for angina or decompensation.  Recent echo demonstrated preserved LV systolic function.  Lexiscan MPI in 08/2022 showed no evidence of ischemia.  Per Duke Activity Status Index, he can achieve greater than 4 METS without cardiac limitation.  Per Revised Cardiac Risk Index, he is low risk for noncardiac surgery, with an estimated rate of 0.9% for adverse cardiac event in the perioperative timeframe.  From a cardiac perspective, he can proceed with noncardiac surgery without further cardiac testing at an overall low risk.  Patient's PCP indicates he will need bridging given recent and recurrent DVT/PE.  Management of this in the perioperative timeframe is deferred to them."   I will route this recommendation to the requesting party via [place] fax function and remove from pre-op pool.   Please call with questions.   [name], PA-C   As above, we do not manage this patient's Eliquis and therefore recommend surgeon reach out to PCP for clearance to hold.  CALLBACK: I think the fax number may be wrong below, and it is different than the fax number in the one [facility] sent to as well. Will route to callback to confirm fax and assist with notifying surgeon of recommendation above.   Charlie Pitter, PA-C 02/15/2023, 10:54 AM

## 2023-02-15 NOTE — Telephone Encounter (Signed)
   Pre-operative Risk Assessment    Patient Name: Brian Atkins  DOB: [birth year] MRN: [id number]      Request for Surgical Clearance    Procedure:  Shoulder Scope  Date of Surgery:  Clearance 02/22/23                                 Surgeon:  Dr. Mcarthur Rossetti Surgeon's Group or Practice Name:  [facility] Phone number:  [phone] Fax number:  [phone]   Type of Clearance Requested:  Pharmacy, Eliquis    Type of Anesthesia:  Choice   Additional requests/questions:    Signed, Maxwell Caul   02/15/2023, 10:16 AM

## 2023-02-15 NOTE — Telephone Encounter (Signed)
I s/w Dorothea Ogle at surgeon office and did confirm the fax # [phone] is correct. Dorothea Ogle did also confirm they did receive the clearance notes 02/14/23.

## 2023-02-16 ENCOUNTER — Encounter: Payer: Self-pay | Admitting: Orthopedic Surgery

## 2023-02-16 NOTE — Progress Notes (Signed)
Perioperative / Anesthesia Services  Pre-Admission Testing Clinical Review / Preoperative Anesthesia Consult  Date: 02/21/23  Patient Demographics:  Name: Brian Atkins DOB:   1956/11/11 MRN:   [id number]  Planned Surgical Procedure(s):    Case: [id number] Date/Time: 02/22/23 0730   Procedures:      SHOULDER ARTHROSCOPY WITH OPEN ROTATOR CUFF REPAIR AND DISTAL CLAVICLE ACROMINECTOMY (Left)     ARTHROSCOPY SHOULDER (Left: Shoulder)   Anesthesia type: Choice   Pre-op diagnosis: Left Shoulder Rotator Cuff Tear   Location: [facility] OR ROOM 02 / [place] [facility]S FOR ANESTHESIA GROUP   Surgeons: Thornton Park, MD     NOTE: Available PAT nursing documentation and vital signs have been reviewed. Clinical nursing staff has updated patient's PMH/PSHx, current medication list, and drug allergies/intolerances to ensure comprehensive history available to assist in medical decision making as it pertains to the aforementioned surgical procedure and anticipated anesthetic course. Extensive review of available clinical information personally performed. [place] [facility] and PSHx updated with any diagnoses/procedures that  may have been inadvertently omitted during his intake with the pre-admission testing department's nursing staff.  Clinical Discussion:  Brian Atkins is a 67 y.o. male who is submitted for pre-surgical anesthesia review and clearance prior to him undergoing the above procedure. Patient has never been a smoker. Pertinent PMH includes: CAD, diastolic dysfunction, PSVT, pulmonary emboli, splenic vein thrombosis, HTN, HLD, pre-diabetes, dyspnea, GERD (on daily PPI + H2 blocker), idiopathic angioedema, OSAH (requires nocturnal PAP therapy), nephrolithiasis, LEFT rotator cuff tear, insomnia, RLS.  Patient is followed by cardiology (End, MD). He was last seen in the cardiology clinic on 12/03/2022; notes reviewed. At the time of his clinic visit, patient doing well overall from a cardiovascular perspective.   Patient reported persistent episodes of palpitations and vertiginous symptoms, however he noted that both have improved overall. Patient denied any chest pain, shortness of breath, PND, orthopnea, , significant peripheral edema, weakness, fatigue, or presyncope/syncope. Patient with a past medical history significant for cardiovascular diagnoses. Documented physical exam was grossly benign, providing no evidence of acute exacerbation and/or decompensation of the patient's known cardiovascular conditions.  Patient underwent diagnostic LEFT heart catheterization on 12/07/2017 revealing a hyperdynamic left ventricular systolic function with an EF of >55%.  LVEDP moderately elevated consistent with known diastolic dysfunction.  There was multivessel CAD observed; 20% ostial LAD, 60% proximal to mid LAD 50% ostial ramus intermedius, and 50% mid RCA.  FFR analysis was performed on the lesions in the LAD (0.84) and to the RCA (0.94) and found to be hemodynamically insignificant.  Given that patient's coronary artery disease was nonobstructive and causing no hemodynamically significant flow limitations, the decision was made to defer intervention opting for medical management.  Coronary CTA performed on 12/07/2019 revealed an elevated coronary calcium score of 496, which was 48 percentile for age and sex matched control.  There was normal coronary origin with RIGHT-sided dominance.  Recommendations were for aggressive medical management  Patient developed pulmonary embolism on 03/28/2022.  Cardiovascular studies indicated presence of (+) RIGHT sided heart strain. Patient required mechanical thrombectomy.  He was treated with postprocedural enoxaparin twice a day for 6 months.  Most recent TTE was performed on 08/24/2022 revealing a normal left ventricular systolic function with an EF of 60 to 65%.  No regional wall motion abnormalities. Left ventricular diastolic Doppler parameters consistent with  pseudonormalization (G2DD).  Right ventricular size and function normal.  No significant valvular regurgitation.  All transvalvular gradients were noted to be normal providing  no evidence suggestive of valvular stenosis.  Most recent myocardial perfusion imaging study performed on 08/25/2022 revealed a normal left ventricular systolic function with a hyperdynamic LVEF of 76%.  There was no evidence of stress-induced myocardial ischemia or arrhythmia; no scintigraphic evidence of scar.  Study determined to be normal and low risk.  Long-term cardiac event monitor study performed on 11/18/2022 revealing a predominant underlying sinus rhythm at an average rate of 66 bpm; range 47-128 bpm.  There was 1 pause lasting 3.3 seconds.  Rare atrial and ventricular ectopy noted.  Patient triggered events correlated with isolated PVCs.  No significant arrhythmias noted.  As previously mentioned, patient developed pulmonary embolism back in 03/2022, which was treated with mechanical thrombectomy.  Patient remains on daily anticoagulation therapy using standard dose apixaban.  He is reportedly compliant with therapy with no evidence or reports of GI bleeding.  Blood pressure well controlled at 120/80 mmHg on currently prescribed beta-blocker (carvedilol), diuretic (furosemide + hydrochlorothiazide), and ARB (losartan) therapies.  Patient is on atorvastatin + ezetimibe for his HLD diagnosis and ASCVD prevention.  Patient also has a supply of short acting nitrates (NTG) to use on a as needed basis for recurrent angina/anginal equivalent symptoms; denied recent use.  Patient has a prediabetes diagnosis.  Blood sugars have been well-managed with diet lifestyle modifications alone.  Hemoglobin A1c has been running 6.0% when checked over the course of the last 2 years. He does have an OSAH diagnosis and is reportedly compliant with prescribed nocturnal PAP therapy. Functional capacity, as defined by DASI, is documented as being >/=  4 METS. No changes were made to his medication regimen during his visit with cardiology.  Patient scheduled to follow-up with outpatient cardiology in 4 months or sooner if needed.  Brian Atkins is scheduled for an elective SHOULDER ARTHROSCOPY WITH OPEN ROTATOR CUFF REPAIR AND DISTAL CLAVICLE ACROMINECTOMY (Left) ARTHROSCOPY SHOULDER (Left: Shoulder) on 02/22/2023 with Dr. Thornton Park, MD.  Given patient's past medical history significant for cardiovascular diagnoses, presurgical cardiac clearance was sought by the PAT team. Per cardiology, "based ACC/AHA guidelines, the patient's past medical history, and the amount of time since his last clinic visit, this patient would be at an overall ACCEPTABLE risk for the planned procedure without further cardiovascular testing or intervention at this time".   Again patient remains on daily oral anticoagulation therapy.  He has been instructed on recommendations from his PCP for holding his apixaban dose for 2 days prior to his procedure with plans to restart extended postoperatively respectively minimized by his primary attending surgeon.  The patient is aware that his last dose of apixaban should be on 02/18/2022.  PCP indicates that patient will not require enoxaparin bridging for this procedure.  Patient reports previous perioperative complications with anesthesia in the past. Patient has both a (+) personal and familial history of delayed emergence. In review of the available records, it is noted that patient underwent a general anesthetic course here at [facility] ([facility]) in 02/2022 without documented complications.      02/07/2023   10:10 AM 01/26/2023    1:55 PM 01/24/2023    1:56 PM  Vitals with BMI  Height '6\' 2"'$  '6\' 1"'$  '6\' 2"'$   Weight 210 lbs 10 oz 250 lbs 216 lbs  BMI 27.03 99991111 0000000  Systolic [id number]  Diastolic 70 81 76  Pulse 78 64 44    Providers/Specialists:   NOTE: Primary physician provider  listed below. Patient  may have been seen by APP or partner within same practice.   PROVIDER ROLE / SPECIALTY LAST Larey Seat, MD Orthopedics (Surgeon) 02/09/2023  [name], MD Primary Care Provider 01/24/2023  Nelva Bush, MD Cardiology 12/03/2022   Allergies:  Avelox [moxifloxacin], Gadolinium derivatives, Contrast media [iodinated contrast media], Crestor [rosuvastatin calcium], Morphine and related, Niacin, Niacin-simvastatin er, Latex, Tape, Tapentadol, and Wound dressing adhesive  Current Home Medications:   No current facility-administered medications for this encounter.    albuterol (VENTOLIN HFA) 108 (90 Base) MCG/ACT inhaler   atorvastatin (LIPITOR) 20 MG tablet   carvedilol (COREG) 12.5 MG tablet   cyanocobalamin (VITAMIN B12) 1000 MCG/ML injection   DULoxetine (CYMBALTA) 20 MG capsule   ELIQUIS 5 MG TABS tablet   eszopiclone 3 MG TABS   ezetimibe (ZETIA) 10 MG tablet   famotidine (PEPCID) 20 MG tablet   furosemide (LASIX) 40 MG tablet   hydrochlorothiazide (MICROZIDE) 12.5 MG capsule   losartan (COZAAR) 100 MG tablet   Multiple Vitamins-Minerals (MULTIVITAMIN WITH MINERALS) tablet   nitroGLYCERIN (NITROSTAT) 0.4 MG SL tablet   omeprazole (PRILOSEC) 40 MG capsule   ondansetron (ZOFRAN) 4 MG tablet   ONETOUCH VERIO test strip   Prucalopride Succinate (MOTEGRITY) 2 MG TABS   rOPINIRole (REQUIP) 0.25 MG tablet   Syringe/Needle, Disp, (SYRINGE 3CC/25GX1") 25G X 1" 3 ML MISC   tiZANidine (ZANAFLEX) 4 MG tablet   triamcinolone (NASACORT) 55 MCG/ACT AERO nasal inhaler   zolpidem (AMBIEN CR) 12.5 MG CR tablet   History:   Past Medical History:  Diagnosis Date   Complication of anesthesia    a.) delayed emergence   Coronary artery disease, non-occlusive    a. 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)    a.) stage IV with mets to orbits and spleen; Tx'd with splenectomy + XRT + chemotherapy  with associated angioedema reaction requiring intubation (06/27/2014 - 07/02/2014)   Family history of adverse reaction to anesthesia    a.) delayed emergence in 1st degree relative (mother)   GERD (gastroesophageal reflux disease)    Hemorrhoids    Hiatal hernia    History of 2019 novel coronavirus disease (COVID-19) 12/24/2019   History of 2019 novel coronavirus disease (COVID-19) 12/24/2019   History of kidney stones    Hx of splenectomy    a.) secondary MALT lymphoma   Idiopathic angioedema    a.) etiology felt to be related to XRT; "two hours after receiving bilateral orbital radiation patient was noted to have increased swelling of the orbital area which progressed to include the cheek and lips, an intense itching of the eyes bilaterally";  reaction resulted in intubation (06/27/2014 - 07/02/2014)   Insomnia    a.) on prescribed hypnotics (zolpidem + eszopiclone)   Labile hypertension    Left rotator cuff tear    Long term current use of anticoagulant    a.) apixaban   Medial epicondylitis of right elbow    Meralgia paresthetica of right side    Microscopic hematuria    Myalgia due to statin    Ocular migraine  OSA on CPAP    Pneumonia    Prediabetes    PSVT (paroxysmal supraventricular tachycardia)    Pulmonary emboli ([place]) 03/28/2022   a. assoicated (+) RIGHT heart strain; required mechanical thrombectomy   RLS (restless legs syndrome)    a.) on ropinirole   Splenic vein thrombosis    a.) treated with enoxaparin BID x 6 months   Past Surgical History:  Procedure Laterality Date   BRONCHIAL WASHINGS N/A 08/21/2021   Procedure: BRONCHIAL WASHINGS;   Surgeon: Ottie Glazier, MD;  Location: [facility] ORS;  Service: Thoracic;  Laterality: N/A;   COLONOSCOPY     FLEXIBLE BRONCHOSCOPY N/A 08/21/2021   Procedure: FLEXIBLE BRONCHOSCOPY;  Surgeon: Ottie Glazier, MD;  Location: [facility] ORS;  Service: Thoracic;  Laterality: N/A;   INTRAVASCULAR PRESSURE WIRE/FFR STUDY N/A 12/07/2017   Procedure: INTRAVASCULAR PRESSURE WIRE/FFR STUDY;  Surgeon: Nelva Bush, MD;  Location: [place] [facility];  Service: Cardiovascular;  Laterality: N/A;   LEFT HEART CATH AND CORONARY ANGIOGRAPHY Left 12/07/2017   Procedure: LEFT HEART CATH AND CORONARY ANGIOGRAPHY;  Surgeon: Nelva Bush, MD;  Location: [place] [facility];  Service: Cardiovascular;  Laterality: Left;   nissen funduplication  [id number]   [name]   PULMONARY THROMBECTOMY N/A 03/29/2022   Procedure: PULMONARY THROMBECTOMY;  Surgeon: Algernon Huxley, MD;  Location: [place] [facility];  Service: Cardiovascular;  Laterality: N/A;   SEPTOPLASTY Bilateral 08/23/2019   Procedure: SEPTOPLASTY;  Surgeon: [name], MD;  Location: [place];  Service: ENT;  Laterality: Bilateral;   SPLENECTOMY     TURBINATE REDUCTION Bilateral 08/23/2019   Procedure: TURBINATE REDUCTION;  Surgeon: [name], MD;  Location: [place];  Service: ENT;  Laterality: Bilateral;  Latex sensitivity sleep apnea   Family History  Problem Relation Age of Onset   Coronary artery disease Father 47       CABG   Aortic aneurysm Father 25       repaired   Hyperlipidemia Father    Lung cancer Mother    Hypertension Mother    Cancer Mother        bladder   COPD Mother    Stomach cancer Maternal Grandfather    Social History   Tobacco Use   Smoking status: Never    Passive exposure: Past   Smokeless tobacco: Never  Vaping Use   Vaping Use: Never used  Substance Use Topics   Alcohol use: No   Drug use: No    Pertinent Clinical Results:  LABS:   Lab Results  Component Value Date   WBC 8.6  01/24/2023   HGB 15.0 01/24/2023   HCT 43.7 01/24/2023   MCV 91.2 01/24/2023   PLT 405.0 (H) 01/24/2023   Lab Results  Component Value Date   NA 142 01/24/2023   K 3.8 01/24/2023   CO2 27 01/24/2023   GLUCOSE 81 01/24/2023   BUN 16 01/24/2023   CREATININE 1.02 01/24/2023   CALCIUM 9.7 01/24/2023   GFRNONAA >60 08/19/2022    ECG: Date: 12/03/2022 Time ECG obtained: 1132 AM Rate: 56 bpm Rhythm: sinus bradycardia Axis (leads I and aVF): Normal Intervals: PR 192 ms. QRS 98 ms. QTc 399 ms. ST segment and T wave changes: No evidence of acute ST segment elevation or depression Comparison: Similar to previous tracing obtained on 10/21/2022   IMAGING / PROCEDURES: LONG TERM CARDIAC EVENT MONITOR STUDY performed on 11/18/2022 Patch Wear Time:  12 days and 4 hours (2023-11-07T18:11:36-0500 to 2023-11-19T22:23:26-0500) Patient had a  min HR of 47 bpm, max HR of 128 bpm, and avg HR of 66 bpm.  Predominant underlying rhythm was Sinus Rhythm. 1 Pause occurred lasting 3.3 secs (18 bpm).  This happened at 7:48 AM. Rare PACs and rare PVCs. Most triggered events correlated with isolated PVCs.  MYOCARDIAL PERFUSION IMAGING STUDY (LEXISCAN) performed on 08/25/2022 Normal left ventricular systolic function with a hyperdynamic LVEF of 76% Normal myocardial thickening and wall motion Left ventricular cavity size normal SPECT images demonstrate homogenous tracer distribution throughout the myocardium No evidence of stress-induced myocardial ischemia or arrhythmia Normal low risk study  TRANSTHORACIC ECHOCARDIOGRAM performed on 08/24/2022 Left ventricular ejection fraction, by estimation, is 60 to 65%. The left ventricle has normal function. The left ventricle has no regional wall motion abnormalities. There is mild left ventricular hypertrophy. Left ventricular diastolic parameters are consistent with Grade II diastolic dysfunction (pseudonormalization).  Right ventricular systolic function is  normal. The right ventricular size is normal.  The mitral valve is normal in structure. No evidence of mitral valve regurgitation. No evidence of mitral stenosis.  The aortic valve is tricuspid. Aortic valve regurgitation is not visualized. No aortic stenosis is present.  The inferior vena cava is normal in size with greater than 50% respiratory variability, suggesting right atrial pressure of 3 mmHg.   CT CORONARY MORPH W/CTA COR W/SCORE W/CA W/CM &/OR WO/CM performed on 11/27/2019 Mild diffuse nonobstructive CAD. CADRADS = 2. There is calcified plaque seen in left main, LAD, LCx, and RCA. Maximum identified stenosis of 25-49%. Of note, distal RCA incompletely filled with contrast and cannot evaluate stenosis in that portion. Coronary calcium score of 496. This was 86th percentile for age and sex matched control. Normal coronary origin with right dominance. Recommend aggressive medical management.   LEFT HEART CATHETERIZATION AND CORONARY ANGIOGRAPHY performed on 12/07/2017 Moderate, non-obstructive coronary artery disease involving the proximal/mid LAD and mid RCA. Both lesions are not hemodynamically significant by FFR. Hyperdynamic left ventricular contraction (LVEF >65%). Moderately elevated left ventricular filling pressure, consistent with diastolic dysfunction.    Impression and Plan:  Brian Atkins has been referred for pre-anesthesia review and clearance prior to him undergoing the planned anesthetic and procedural courses. Available labs, pertinent testing, and imaging results were personally reviewed by me in preparation for upcoming operative/procedural course. [facility] medical record has been updated following extensive record review and patient interview with PAT staff.   This patient has been appropriately cleared by cardiology with an overall ACCEPTABLE risk of significant perioperative cardiovascular complications. Based on clinical review performed today (02/21/23), barring  any significant acute changes in the patient's overall condition, it is anticipated that he will be able to proceed with the planned surgical intervention. Any acute changes in clinical condition may necessitate his procedure being postponed and/or cancelled. Patient will meet with anesthesia team (MD and/or CRNA) on the day of his procedure for preoperative evaluation/assessment. Questions regarding anesthetic course will be fielded at that time.   Pre-surgical instructions were reviewed with the patient during his PAT appointment, and questions were fielded to satisfaction by PAT clinical staff. He has been instructed on which medications that he will need to hold prior to surgery, as well as the ones that have been deemed safe/appropriate to take of the day of his procedure. As part of the general education provided by PAT, patient made aware both verbally and in writing, that he would need to abstain from the use of any illegal substances during his perioperative course.  He was  advised that failure to follow the provided instructions could necessitate case cancellation or result serious perioperative complications up to and including death. Patient encouraged to contact PAT and/or his surgeon's office to discuss any questions or concerns that may arise prior to surgery; verbalized understanding.   Honor Loh, MSN, APRN, FNP-C, CEN [facility]  Peri-operative Services Nurse Practitioner Phone: [phone] Fax: [phone] 02/21/23 8:46 AM  NOTE: This note has been prepared using Dragon dictation software. Despite my best ability to proofread, there is always the potential that unintentional transcriptional errors may still occur from this process.

## 2023-02-17 ENCOUNTER — Encounter: Payer: Self-pay | Admitting: Internal Medicine

## 2023-02-17 ENCOUNTER — Telehealth: Payer: Self-pay | Admitting: Internal Medicine

## 2023-02-17 DIAGNOSIS — D6859 Other primary thrombophilia: Secondary | ICD-10-CM | POA: Insufficient documentation

## 2023-02-17 NOTE — Assessment & Plan Note (Signed)
Perioperative bridging :  He  will need to stop taking Eliquis after the March 2 dose (no eliquis on March 3,4,5 or 6)  and resume  taking Eliquis on March 7 (later if there was a significant mount of bleeding , per orthopedics opinion )  I recommend using lovenox subcutaneous injection  every 12 hours starting March 4 : first dose   at 8 am  , with the evening dose at  8 pm  (assuming that surgery is not before 8 am on march 5 )  I am sending the rx for 2 lovenox syringes to your pharmacy .  This is a subcutaneous injection; if you do not know how to give it to yourself,  call us tomorrow to arrange  a lesson on Friday or Monday

## 2023-02-17 NOTE — Telephone Encounter (Signed)
Please disregard my , previous message.    after further review of available literature you do  NOT need to use lovenox .You should take your  last dose of eliquis on March 2rd,  none on March  3,  4th 5th or 6  ,  and resume on March 7 provided you have not  had excessive bleeding  post operatively.

## 2023-02-17 NOTE — Progress Notes (Signed)
Called pt to find out what he has been told regarding his Eliquis. Pt states he is still taking it bc no one has told him otherwise. I called over to [facility] @ Dr [facility]arden Mo office and she asked if she had sent form over to Dr Derrel Nip regarding his Eliquis since cardiology states they do not manage his Eliquis therefore his PCP would need to make decision regarding Elqiuis. Jinny Blossom is going to call over to Dr Demetrios Isaacs office now to find out what needs to be done regarding his Eliquis and states she will give me a call once she has found out.

## 2023-02-17 NOTE — Telephone Encounter (Signed)
Form has been placed in red folder.  

## 2023-02-17 NOTE — Telephone Encounter (Signed)
Meghan from [facility] called in staying that pt its having surgery on Tuesday 3/5, and they need to go over some info with provider before then, one of them regarding med Eliquis, also they need an update on the clearance form that was faxed. She's available [phone].

## 2023-02-20 ENCOUNTER — Encounter: Payer: Self-pay | Admitting: Urgent Care

## 2023-02-22 ENCOUNTER — Encounter
Admission: RE | Disposition: A | Discharge status: Home / Self Care | Payer: Self-pay | Source: Home / Self Care | Attending: Orthopedic Surgery

## 2023-02-22 ENCOUNTER — Encounter: Payer: Self-pay | Admitting: Orthopedic Surgery

## 2023-02-22 ENCOUNTER — Ambulatory Visit: Payer: Medicare Other | Admitting: Urgent Care

## 2023-02-22 ENCOUNTER — Other Ambulatory Visit: Payer: Self-pay

## 2023-02-22 ENCOUNTER — Ambulatory Visit
Admission: RE | Admit: 2023-02-22 | Discharge: 2023-02-22 | Disposition: A | Discharge status: Home / Self Care | Payer: Medicare Other | Attending: Orthopedic Surgery | Admitting: Orthopedic Surgery

## 2023-02-22 ENCOUNTER — Ambulatory Visit: Payer: Medicare Other

## 2023-02-22 DIAGNOSIS — Z79899 Other long term (current) drug therapy: Secondary | ICD-10-CM | POA: Diagnosis not present

## 2023-02-22 DIAGNOSIS — X58XXXA Exposure to other specified factors, initial encounter: Secondary | ICD-10-CM | POA: Diagnosis not present

## 2023-02-22 DIAGNOSIS — Z86711 Personal history of pulmonary embolism: Secondary | ICD-10-CM | POA: Insufficient documentation

## 2023-02-22 DIAGNOSIS — Z7901 Long term (current) use of anticoagulants: Secondary | ICD-10-CM | POA: Diagnosis not present

## 2023-02-22 DIAGNOSIS — I119 Hypertensive heart disease without heart failure: Secondary | ICD-10-CM | POA: Insufficient documentation

## 2023-02-22 DIAGNOSIS — S43432A Superior glenoid labrum lesion of left shoulder, initial encounter: Secondary | ICD-10-CM | POA: Diagnosis not present

## 2023-02-22 DIAGNOSIS — M19012 Primary osteoarthritis, left shoulder: Secondary | ICD-10-CM | POA: Diagnosis not present

## 2023-02-22 DIAGNOSIS — M7552 Bursitis of left shoulder: Secondary | ICD-10-CM | POA: Insufficient documentation

## 2023-02-22 DIAGNOSIS — K219 Gastro-esophageal reflux disease without esophagitis: Secondary | ICD-10-CM | POA: Insufficient documentation

## 2023-02-22 DIAGNOSIS — M75102 Unspecified rotator cuff tear or rupture of left shoulder, not specified as traumatic: Secondary | ICD-10-CM | POA: Insufficient documentation

## 2023-02-22 DIAGNOSIS — E785 Hyperlipidemia, unspecified: Secondary | ICD-10-CM | POA: Diagnosis not present

## 2023-02-22 DIAGNOSIS — G4733 Obstructive sleep apnea (adult) (pediatric): Secondary | ICD-10-CM | POA: Diagnosis not present

## 2023-02-22 DIAGNOSIS — I2510 Atherosclerotic heart disease of native coronary artery without angina pectoris: Secondary | ICD-10-CM | POA: Insufficient documentation

## 2023-02-22 DIAGNOSIS — M25812 Other specified joint disorders, left shoulder: Secondary | ICD-10-CM | POA: Insufficient documentation

## 2023-02-22 DIAGNOSIS — G4700 Insomnia, unspecified: Secondary | ICD-10-CM | POA: Insufficient documentation

## 2023-02-22 HISTORY — DX: Restless legs syndrome: G25.81

## 2023-02-22 HISTORY — DX: Prediabetes: R73.03

## 2023-02-22 HISTORY — DX: Obstructive sleep apnea (adult) (pediatric): G47.33

## 2023-02-22 HISTORY — DX: Myalgia, unspecified site: M79.10

## 2023-02-22 HISTORY — DX: Extranodal marginal zone b-cell lymphoma of mucosa-associated lymphoid tissue (malt-lymphoma) not having achieved remission: C88.40

## 2023-02-22 HISTORY — DX: Insomnia, unspecified: G47.00

## 2023-02-22 HISTORY — DX: Acquired absence of spleen: Z90.81

## 2023-02-22 HISTORY — DX: Angioneurotic edema, initial encounter: T78.3XXA

## 2023-02-22 HISTORY — DX: Supraventricular tachycardia, unspecified: I47.10

## 2023-02-22 HISTORY — DX: Unspecified rotator cuff tear or rupture of left shoulder, not specified as traumatic: M75.102

## 2023-02-22 HISTORY — DX: Other ill-defined heart diseases: I51.89

## 2023-02-22 HISTORY — PX: SHOULDER ARTHROSCOPY: SHX128

## 2023-02-22 HISTORY — DX: Long term (current) use of anticoagulants: Z79.01

## 2023-02-22 HISTORY — DX: Extranodal marginal zone B-cell lymphoma of mucosa-associated lymphoid tissue (MALT-lymphoma): C88.4

## 2023-02-22 HISTORY — DX: Acute embolism and thrombosis of other specified veins: I82.890

## 2023-02-22 HISTORY — PX: SHOULDER ARTHROSCOPY WITH OPEN ROTATOR CUFF REPAIR AND DISTAL CLAVICLE ACROMINECTOMY: SHX5683

## 2023-02-22 HISTORY — DX: Adverse effect of antihyperlipidemic and antiarteriosclerotic drugs, initial encounter: T46.6X5A

## 2023-02-22 SURGERY — SHOULDER ARTHROSCOPY WITH OPEN ROTATOR CUFF REPAIR AND DISTAL CLAVICLE ACROMINECTOMY
Anesthesia: General | Site: Shoulder | Laterality: Left

## 2023-02-22 MED ORDER — DEXAMETHASONE SODIUM PHOSPHATE 10 MG/ML IJ SOLN
INTRAMUSCULAR | Status: DC | PRN
  Administered 2023-02-22: 10 mg via INTRAVENOUS

## 2023-02-22 MED ORDER — BUPIVACAINE LIPOSOME 1.3 % IJ SUSP
INTRAMUSCULAR | Status: AC
  Filled 2023-02-22: qty 10

## 2023-02-22 MED ORDER — BUPIVACAINE LIPOSOME 1.3 % IJ SUSP
INTRAMUSCULAR | Status: AC
  Filled 2023-02-22: qty 20

## 2023-02-22 MED ORDER — EPHEDRINE SULFATE (PRESSORS) 50 MG/ML IJ SOLN
INTRAMUSCULAR | Status: DC | PRN
  Administered 2023-02-22: 2.5 mg via INTRAVENOUS

## 2023-02-22 MED ORDER — MIDAZOLAM HCL 2 MG/2ML IJ SOLN
INTRAMUSCULAR | Status: AC
  Filled 2023-02-22: qty 2

## 2023-02-22 MED ORDER — OXYCODONE HCL 5 MG PO TABS: 5.0000 mg | ORAL_TABLET | ORAL | 0 refills | Status: DC | PRN

## 2023-02-22 MED ORDER — FENTANYL CITRATE (PF) 100 MCG/2ML IJ SOLN
INTRAMUSCULAR | Status: DC | PRN
  Administered 2023-02-22 (×2): 50 ug via INTRAVENOUS

## 2023-02-22 MED ORDER — LACTATED RINGERS IR SOLN
Status: DC | PRN
  Administered 2023-02-22 (×5): 3000 mL

## 2023-02-22 MED ORDER — SUGAMMADEX SODIUM 200 MG/2ML IV SOLN
INTRAVENOUS | Status: DC | PRN
  Administered 2023-02-22: 200 mg via INTRAVENOUS

## 2023-02-22 MED ORDER — PHENYLEPHRINE HCL-NACL 20-0.9 MG/250ML-% IV SOLN
INTRAVENOUS | Status: AC
  Filled 2023-02-22: qty 250

## 2023-02-22 MED ORDER — EPINEPHRINE PF 1 MG/ML IJ SOLN
INTRAMUSCULAR | Status: AC
  Filled 2023-02-22: qty 4

## 2023-02-22 MED ORDER — ONDANSETRON HCL 4 MG PO TABS: 4.0000 mg | ORAL_TABLET | Freq: Three times a day (TID) | ORAL | 0 refills | Status: DC | PRN

## 2023-02-22 MED ORDER — CEFAZOLIN SODIUM-DEXTROSE 2-4 GM/100ML-% IV SOLN
INTRAVENOUS | Status: AC
  Filled 2023-02-22: qty 100

## 2023-02-22 MED ORDER — CHLORHEXIDINE GLUCONATE 0.12 % MT SOLN
OROMUCOSAL | Status: AC
  Filled 2023-02-22: qty 15

## 2023-02-22 MED ORDER — DROPERIDOL 2.5 MG/ML IJ SOLN
0.6250 mg | Freq: Once | INTRAMUSCULAR | Status: AC
  Administered 2023-02-22: 0.625 mg via INTRAVENOUS

## 2023-02-22 MED ORDER — PHENYLEPHRINE 80 MCG/ML (10ML) SYRINGE FOR IV PUSH (FOR BLOOD PRESSURE SUPPORT)
PREFILLED_SYRINGE | INTRAVENOUS | Status: DC | PRN
  Administered 2023-02-22: 80 ug via INTRAVENOUS

## 2023-02-22 MED ORDER — BUPIVACAINE LIPOSOME 1.3 % IJ SUSP
INTRAMUSCULAR | Status: DC | PRN
  Administered 2023-02-22: 20 mL

## 2023-02-22 MED ORDER — ROCURONIUM BROMIDE 100 MG/10ML IV SOLN
INTRAVENOUS | Status: DC | PRN
  Administered 2023-02-22 (×2): 20 mg via INTRAVENOUS
  Administered 2023-02-22: 60 mg via INTRAVENOUS

## 2023-02-22 MED ORDER — BUPIVACAINE HCL (PF) 0.25 % IJ SOLN
INTRAMUSCULAR | Status: AC
  Filled 2023-02-22: qty 30

## 2023-02-22 MED ORDER — OXYCODONE HCL 5 MG/5ML PO SOLN: 5.0000 mg | Freq: Once | ORAL | Status: DC | PRN

## 2023-02-22 MED ORDER — BUPIVACAINE HCL (PF) 0.5 % IJ SOLN
INTRAMUSCULAR | Status: AC
  Filled 2023-02-22: qty 10

## 2023-02-22 MED ORDER — ONDANSETRON HCL 4 MG/2ML IJ SOLN
INTRAMUSCULAR | Status: DC | PRN
  Administered 2023-02-22: 4 mg via INTRAVENOUS

## 2023-02-22 MED ORDER — MIDAZOLAM HCL 2 MG/2ML IJ SOLN: 1.0000 mg | Freq: Once | INTRAMUSCULAR | Status: AC

## 2023-02-22 MED ORDER — PHENYLEPHRINE HCL-NACL 20-0.9 MG/250ML-% IV SOLN
INTRAVENOUS | Status: DC | PRN
  Administered 2023-02-22: 40 ug/min via INTRAVENOUS

## 2023-02-22 MED ORDER — BUPIVACAINE HCL (PF) 0.5 % IJ SOLN
INTRAMUSCULAR | Status: DC | PRN
  Administered 2023-02-22: 10 mL

## 2023-02-22 MED ORDER — CHLORHEXIDINE GLUCONATE 0.12 % MT SOLN
OROMUCOSAL | Status: AC
  Administered 2023-02-22: 15 mL
  Filled 2023-02-22: qty 15

## 2023-02-22 MED ORDER — CEFAZOLIN SODIUM-DEXTROSE 2-4 GM/100ML-% IV SOLN
2.0000 g | INTRAVENOUS | Status: AC
  Administered 2023-02-22: 2 g via INTRAVENOUS

## 2023-02-22 MED ORDER — FENTANYL CITRATE PF 50 MCG/ML IJ SOSY: 50.0000 ug | PREFILLED_SYRINGE | Freq: Once | INTRAMUSCULAR | Status: AC

## 2023-02-22 MED ORDER — FENTANYL CITRATE (PF) 100 MCG/2ML IJ SOLN
INTRAMUSCULAR | Status: AC
  Filled 2023-02-22: qty 2

## 2023-02-22 MED ORDER — CHLORHEXIDINE GLUCONATE 0.12 % MT SOLN: 15.0000 mL | Freq: Once | OROMUCOSAL | Status: DC

## 2023-02-22 MED ORDER — PROPOFOL 10 MG/ML IV BOLUS
INTRAVENOUS | Status: DC | PRN
  Administered 2023-02-22: 150 mg via INTRAVENOUS

## 2023-02-22 MED ORDER — MIDAZOLAM HCL 2 MG/2ML IJ SOLN
INTRAMUSCULAR | Status: AC
  Administered 2023-02-22: 2 mg via INTRAVENOUS
  Filled 2023-02-22: qty 2

## 2023-02-22 MED ORDER — CHLORHEXIDINE GLUCONATE CLOTH 2 % EX PADS: 6.0000 | MEDICATED_PAD | Freq: Once | CUTANEOUS | Status: DC

## 2023-02-22 MED ORDER — ACETAMINOPHEN 500 MG PO TABS
ORAL_TABLET | ORAL | Status: AC
  Filled 2023-02-22: qty 2

## 2023-02-22 MED ORDER — ORAL CARE MOUTH RINSE: 15.0000 mL | Freq: Once | OROMUCOSAL | Status: DC

## 2023-02-22 MED ORDER — ACETAMINOPHEN 500 MG PO TABS
ORAL_TABLET | ORAL | Status: AC
  Administered 2023-02-22: 1000 mg via ORAL
  Filled 2023-02-22: qty 2

## 2023-02-22 MED ORDER — LIDOCAINE HCL (PF) 1 % IJ SOLN
INTRAMUSCULAR | Status: AC
  Filled 2023-02-22: qty 30

## 2023-02-22 MED ORDER — FENTANYL CITRATE PF 50 MCG/ML IJ SOSY
PREFILLED_SYRINGE | INTRAMUSCULAR | Status: AC
  Administered 2023-02-22: 50 ug via INTRAVENOUS
  Filled 2023-02-22: qty 1

## 2023-02-22 MED ORDER — LACTATED RINGERS IR SOLN
Status: DC | PRN
  Administered 2023-02-22 (×4): 3001 mL

## 2023-02-22 MED ORDER — OXYCODONE HCL 5 MG PO TABS: 5.0000 mg | ORAL_TABLET | Freq: Once | ORAL | Status: DC | PRN

## 2023-02-22 MED ORDER — ACETAMINOPHEN 500 MG PO TABS: 1000.0000 mg | ORAL_TABLET | ORAL | Status: AC

## 2023-02-22 MED ORDER — DROPERIDOL 2.5 MG/ML IJ SOLN
INTRAMUSCULAR | Status: AC
  Filled 2023-02-22: qty 2

## 2023-02-22 MED ORDER — LACTATED RINGERS IV SOLN: INTRAVENOUS | Status: DC

## 2023-02-22 MED ORDER — PROPOFOL 10 MG/ML IV BOLUS
INTRAVENOUS | Status: AC
  Filled 2023-02-22: qty 40

## 2023-02-22 MED ORDER — FENTANYL CITRATE (PF) 100 MCG/2ML IJ SOLN: 25.0000 ug | INTRAMUSCULAR | Status: DC | PRN

## 2023-02-22 SURGICAL SUPPLY — 73 items
ADAPTER IRRIG TUBE 2 SPIKE SOL (ADAPTER) ×2 IMPLANT
ADPR TBG 2 SPK PMP STRL ASCP (ADAPTER) ×2
ANCH SUT 2.9 PUSHLOCK ANCH (Orthopedic Implant) ×1 IMPLANT
ANCH SUT 5.5 KNTLS PEEK (Orthopedic Implant) ×1 IMPLANT
ANCH SUT Q-FX 2.8 (Anchor) ×1 IMPLANT
ANCHOR ALL-SUT Q-FIX 2.8 (Anchor) ×4 IMPLANT
ANCHOR SUT 5.5 MULTIFIX (Orthopedic Implant) IMPLANT
BLADE SHAVER 4.5X7 STR FR (MISCELLANEOUS) IMPLANT
CANNULA 5.75X7 CRYSTAL CLEAR (CANNULA) ×1 IMPLANT
CANNULA PARTIAL THREAD 2X7 (CANNULA) IMPLANT
CANNULA TWIST IN 8.25X9CM (CANNULA) IMPLANT
CONNECTOR PERFECT PASSER (CONNECTOR) ×1 IMPLANT
COOLER POLAR GLACIER W/PUMP (MISCELLANEOUS) ×1 IMPLANT
DEVICE SUCT BLK HOLE OR FLOOR (MISCELLANEOUS) ×2 IMPLANT
DRAPE 3/4 80X56 (DRAPES) ×1 IMPLANT
DRAPE U-SHAPE 47X51 STRL (DRAPES) ×1 IMPLANT
DURAPREP 26ML APPLICATOR (WOUND CARE) ×3 IMPLANT
ELECT REM PT RETURN 9FT ADLT (ELECTROSURGICAL) ×1
ELECTRODE REM PT RTRN 9FT ADLT (ELECTROSURGICAL) ×1 IMPLANT
GAUZE SPONGE 4X4 12PLY STRL (GAUZE/BANDAGES/DRESSINGS) ×1 IMPLANT
GAUZE XEROFORM 1X8 LF (GAUZE/BANDAGES/DRESSINGS) ×1 IMPLANT
GLOVE BIOGEL PI IND STRL 9 (GLOVE) ×1 IMPLANT
GLOVE BIOGEL PI ORTHO SZ9 (GLOVE) ×6 IMPLANT
GOWN STRL REUS TWL 2XL XL LVL4 (GOWN DISPOSABLE) ×1 IMPLANT
GOWN STRL REUS W/ TWL LRG LVL3 (GOWN DISPOSABLE) ×1 IMPLANT
GOWN STRL REUS W/TWL LRG LVL3 (GOWN DISPOSABLE) ×1
GUIDEWIRE 1.2MMX18 (WIRE) IMPLANT
IV LACTATED RINGER IRRG 3000ML (IV SOLUTION) ×9
IV LR IRRIG 3000ML ARTHROMATIC (IV SOLUTION) ×8 IMPLANT
KIT STABILIZATION SHOULDER (MISCELLANEOUS) ×1 IMPLANT
KIT SUTURE 2.8 Q-FIX DISP (MISCELLANEOUS) ×1 IMPLANT
KIT SUTURETAK 3.0 INSERT PERC (KITS) IMPLANT
KIT TURNOVER KIT A (KITS) ×1 IMPLANT
MANIFOLD NEPTUNE II (INSTRUMENTS) ×1 IMPLANT
MASK FACE SPIDER DISP (MASK) ×1 IMPLANT
MAT ABSORB  FLUID 56X50 GRAY (MISCELLANEOUS) ×2
MAT ABSORB FLUID 56X50 GRAY (MISCELLANEOUS) ×2 IMPLANT
NDL SAFETY ECLIP 18X1.5 (MISCELLANEOUS) ×1 IMPLANT
PACK ARTHROSCOPY SHOULDER (MISCELLANEOUS) ×1 IMPLANT
PAD ABD DERMACEA PRESS 5X9 (GAUZE/BANDAGES/DRESSINGS) ×1 IMPLANT
PAD ARMBOARD 7.5X6 YLW CONV (MISCELLANEOUS) ×2 IMPLANT
PAD WRAPON POLAR SHDR XLG (MISCELLANEOUS) ×1 IMPLANT
PASSER SUT FIRSTPASS SELF (INSTRUMENTS) ×1 IMPLANT
PENCIL SMOKE EVACUATOR (MISCELLANEOUS) IMPLANT
SHAVER BLADE BONE CUTTER 4.5 (BLADE) ×1 IMPLANT
SHAVER BLADE TAPERED BLUNT 4 (BLADE) ×1 IMPLANT
SLEEVE REMOTE CONTROL 5X12 (DRAPES) ×1 IMPLANT
SPONGE T-LAP 18X18 ~~LOC~~+RFID (SPONGE) ×1 IMPLANT
STRIP CLOSURE SKIN 1/2X4 (GAUZE/BANDAGES/DRESSINGS) ×1 IMPLANT
SUT ETHILON 4-0 (SUTURE)
SUT ETHILON 4-0 FS2 18XMFL BLK (SUTURE)
SUT LASSO 90 DEG SD STR (SUTURE) IMPLANT
SUT MNCRL 4-0 (SUTURE)
SUT MNCRL 4-0 27XMFL (SUTURE)
SUT MNCRL AB 4-0 PS2 18 (SUTURE) IMPLANT
SUT PDS AB 0 CT1 27 (SUTURE) ×3 IMPLANT
SUT PERFECTPASSER WHITE CART (SUTURE) ×4 IMPLANT
SUT SMART STITCH CARTRIDGE (SUTURE) ×4 IMPLANT
SUT ULTRABRAID 2 COBRAID 38 (SUTURE) IMPLANT
SUT VIC AB 0 CT1 36 (SUTURE) ×3 IMPLANT
SUT VIC AB 2-0 CT2 27 (SUTURE) ×1 IMPLANT
SUTURE ETHLN 4-0 FS2 18XMF BLK (SUTURE) ×1 IMPLANT
SUTURE MNCRL 4-0 27XMF (SUTURE) ×1 IMPLANT
SYR 10ML LL (SYRINGE) ×1 IMPLANT
SYSTEM IMPL TENODESIS LNT 2.9 (Orthopedic Implant) IMPLANT
TAPE MICROFOAM 4IN (TAPE) ×1 IMPLANT
TRAP FLUID SMOKE EVACUATOR (MISCELLANEOUS) ×1 IMPLANT
TUBE SET DOUBLEFLO INFLOW (TUBING) ×1 IMPLANT
TUBING CONNECTING 10 (TUBING) ×1 IMPLANT
TUBING OUTFLOW SET DBLFO PUMP (TUBING) ×1 IMPLANT
WAND WEREWOLF FLOW 90D (MISCELLANEOUS) ×1 IMPLANT
WATER STERILE IRR 500ML POUR (IV SOLUTION) ×1 IMPLANT
WRAPON POLAR PAD SHDR XLG (MISCELLANEOUS) ×1

## 2023-02-22 NOTE — Anesthesia Preprocedure Evaluation (Signed)
Anesthesia Evaluation  Patient identified by MRN, date of birth, ID band Patient awake    Reviewed: Allergy & Precautions, H&P , NPO status , Patient's Chart, lab work & pertinent test results  History of Anesthesia Complications Negative for: history of anesthetic complications  Airway Mallampati: III  TM Distance: >3 FB Neck ROM: full    Dental no notable dental hx. (+) Chipped, Dental Advidsory Given   Pulmonary shortness of breath and with exertion, sleep apnea , neg COPD, Patient abstained from smoking.Not current smoker Hx angioedema 2015 s/p chemorad   Pulmonary exam normal breath sounds clear to auscultation       Cardiovascular Exercise Tolerance: Good METShypertension, + CAD and +CHF  (-) Past MI Normal cardiovascular exam(-) dysrhythmias  Rhythm:regular Rate:Normal  TTE 2021 1. Left ventricular ejection fraction, by estimation, is 60 to 65%. The  left ventricle has normal function. The left ventricle has no regional  wall motion abnormalities. There is mild left ventricular hypertrophy.  Left ventricular diastolic parameters  are consistent with Grade I diastolic dysfunction (impaired relaxation).  2. Right ventricular systolic function is normal. The right ventricular  size is normal. Tricuspid regurgitation signal is inadequate for assessing  PA pressure.  3. The mitral valve is normal in structure and function. Trivial mitral  valve regurgitation. No evidence of mitral stenosis.  4. The aortic valve is tricuspid. Aortic valve regurgitation is not  visualized. No aortic stenosis is present.    Neuro/Psych negative neurological ROS  negative psych ROS   GI/Hepatic Neg liver ROS,GERD  Medicated,,Motility disorder   Endo/Other  negative endocrine ROSneg diabetes  Prediabetes   Renal/GU negative Renal ROS     Musculoskeletal   Abdominal   Peds  Hematology negative hematology ROS (+) MALT lymphoma  s/p chemo, rad, splenectomy   Anesthesia Other Findings Past Medical History: No date: Complication of anesthesia     Comment:  a.) delayed emergence No date: Coronary artery disease, non-occlusive     Comment:  a. 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)     Comment:  a.) stage IV with mets to orbits and spleen; Tx'd with               splenectomy + XRT + chemotherapy  with associated               angioedema reaction requiring intubation (06/27/2014 -               07/02/2014) No date: Family history of adverse reaction to anesthesia     Comment:  a.) delayed emergence in 1st degree relative (mother) No date: GERD (gastroesophageal reflux disease) No date: Hemorrhoids No date: Hiatal hernia 12/24/2019: History of 2019 novel coronavirus disease (COVID-19) 12/24/2019: History of 2019 novel coronavirus disease (COVID-19) No date: History of kidney stones No date: Hx of splenectomy  Comment:  a.) secondary MALT lymphoma No date: Idiopathic angioedema     Comment:  a.) etiology felt to be related to XRT; "two hours after              receiving bilateral orbital radiation patient was noted               to have increased swelling of the orbital area which               progressed to include the cheek and lips, an intense               itching of the eyes bilaterally";  reaction resulted in               intubation (06/27/2014 - 07/02/2014) No date: Insomnia     Comment:  a.) on prescribed hypnotics (zolpidem +  eszopiclone) No date: Labile hypertension No date: Left rotator cuff tear No date: Long term current use of anticoagulant     Comment:  a.) apixaban No date: Medial epicondylitis of right elbow No date: Meralgia paresthetica of right side No date: Microscopic hematuria No date: Myalgia due to statin No date: Ocular migraine No date: OSA on CPAP No date: Pneumonia No date: Prediabetes No date: PSVT (paroxysmal supraventricular tachycardia) 03/28/2022: Pulmonary emboli (HCC)     Comment:  a. assoicated (+) RIGHT heart strain; required               mechanical thrombectomy No date: RLS (restless legs syndrome)     Comment:  a.) on ropinirole No date: Splenic vein thrombosis     Comment:  a.) treated with enoxaparin BID x 6 months  Past Surgical History: 08/21/2021: BRONCHIAL WASHINGS; N/A     Comment:  Procedure: BRONCHIAL WASHINGS;  Surgeon: Ottie Glazier, MD;  Location: [facility];  Service: Thoracic;                Laterality: N/A; No date: COLONOSCOPY 08/21/2021: FLEXIBLE BRONCHOSCOPY; N/A     Comment:  Procedure: FLEXIBLE BRONCHOSCOPY;  Surgeon: Ottie Glazier, MD;  Location: [facility];  Service: Thoracic;                Laterality: N/A; 12/07/2017: INTRAVASCULAR PRESSURE WIRE/FFR STUDY; N/A     Comment:  Procedure: INTRAVASCULAR PRESSURE WIRE/FFR STUDY;                Surgeon: Nelva Bush, MD;  Location: [place]               CV [facility];  Service: Cardiovascular;  Laterality: N/A; 12/07/2017: LEFT HEART CATH AND CORONARY ANGIOGRAPHY; Left     Comment:  Procedure: LEFT HEART CATH AND CORONARY ANGIOGRAPHY;                Surgeon: Nelva Bush, MD;  Location: [place]               CV LAB;  Service: Cardiovascular;  Laterality: Left; [id number]: nissen funduplication     Comment:  [name] 03/29/2022: PULMONARY THROMBECTOMY; N/A     Comment:  Procedure: PULMONARY THROMBECTOMY;  Surgeon: Algernon Huxley, MD;  Location: [place] [facility];  Service:  Cardiovascular;  Laterality: N/A; 08/23/2019: SEPTOPLASTY; Bilateral     Comment:  Procedure: SEPTOPLASTY;  Surgeon: [name], MD;                Location: [place];  Service: ENT;                Laterality: Bilateral; No date: SPLENECTOMY 08/23/2019: TURBINATE REDUCTION; Bilateral     Comment:  Procedure: TURBINATE REDUCTION;  Surgeon: [name],              MD;  Location: [place];  Service: ENT;                Laterality: Bilateral;  Latex sensitivity sleep apnea  BMI    Body Mass Index: 26.32 kg/m      Reproductive/Obstetrics negative OB ROS                             Anesthesia Physical Anesthesia Plan  ASA: 3  Anesthesia Plan: General ETT   Post-op Pain Management: Regional block*   Induction: Intravenous  PONV Risk Score and Plan: 2 and Ondansetron, Dexamethasone and Midazolam  Airway Management Planned: Oral ETT  Additional Equipment:   Intra-op Plan:   Post-operative Plan: Extubation in OR  Informed Consent: I have reviewed the patients History and Physical, chart, labs and discussed the procedure including the risks, benefits and alternatives for the proposed anesthesia with the patient or authorized representative who has indicated his/her understanding and acceptance.     Dental Advisory Given  Plan Discussed with: Anesthesiologist, CRNA and Surgeon  Anesthesia Plan Comments: (Patient consented for risks of anesthesia including but not limited to:  - adverse reactions to medications - damage to eyes, teeth, lips or other oral mucosa - nerve damage due to positioning  - sore throat or hoarseness - Damage to heart, brain, nerves, lungs, other parts of body or loss of life  Patient voiced understanding.)        Anesthesia Quick Evaluation

## 2023-02-22 NOTE — H&P (Signed)
PREOPERATIVE H&P  Chief Complaint: Left Shoulder Rotator Cuff Tear  HPI: Brian Atkins is a 67 y.o. male who presents for preoperative history and physical with a diagnosis of Left Shoulder Rotator Cuff Tear confirmed by MRI. Symptoms of pain, weakness and limited ROM are significantly impairing activities of daily living.  Patient has failed nonoperative management wished to proceed with surgical fixation of his left rotator cuff tear.  Past Medical History:  Diagnosis Date   Complication of anesthesia    a.) delayed emergence   Coronary artery disease, non-occlusive    a. 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)    a.) stage IV with mets to orbits and spleen; Tx'd with splenectomy + XRT + chemotherapy  with associated angioedema reaction requiring intubation (06/27/2014 - 07/02/2014)   Family history of adverse reaction to anesthesia    a.) delayed emergence in 1st degree relative (mother)   GERD (gastroesophageal reflux disease)    Hemorrhoids    Hiatal hernia    History of 2019 novel coronavirus disease (COVID-19) 12/24/2019   History of 2019 novel coronavirus disease (COVID-19) 12/24/2019   History of kidney stones    Hx of splenectomy    a.) secondary MALT lymphoma   Idiopathic angioedema    a.) etiology felt to be related to XRT; "two hours after receiving bilateral orbital radiation patient was noted to have increased swelling of the orbital area which progressed to include the cheek and lips, an intense itching of the eyes bilaterally";  reaction resulted in intubation (06/27/2014 - 07/02/2014)   Insomnia     a.) on prescribed hypnotics (zolpidem + eszopiclone)   Labile hypertension    Left rotator cuff tear    Long term current use of anticoagulant    a.) apixaban   Medial epicondylitis of right elbow    Meralgia paresthetica of right side    Microscopic hematuria    Myalgia due to statin    Ocular migraine    OSA on CPAP    Pneumonia    Prediabetes    PSVT (paroxysmal supraventricular tachycardia)    Pulmonary emboli ([place]) 03/28/2022   a. assoicated (+) RIGHT heart strain; required mechanical thrombectomy   RLS (restless legs syndrome)    a.) on ropinirole   Splenic vein thrombosis    a.) treated with enoxaparin BID x 6 months   Past Surgical History:  Procedure Laterality Date   BRONCHIAL WASHINGS N/A 08/21/2021   Procedure: BRONCHIAL WASHINGS;  Surgeon: Ottie Glazier, MD;  Location: [facility];  Service: Thoracic;  Laterality: N/A;   COLONOSCOPY     FLEXIBLE BRONCHOSCOPY N/A 08/21/2021   Procedure: FLEXIBLE BRONCHOSCOPY;  Surgeon: Ottie Glazier, MD;  Location: [facility];  Service: Thoracic;  Laterality: N/A;   INTRAVASCULAR PRESSURE WIRE/FFR STUDY N/A 12/07/2017   Procedure: INTRAVASCULAR PRESSURE WIRE/FFR STUDY;  Surgeon: Nelva Bush, MD;  Location: [place] [facility];  Service: Cardiovascular;  Laterality: N/A;   LEFT HEART CATH AND CORONARY ANGIOGRAPHY Left 12/07/2017   Procedure: LEFT HEART CATH AND CORONARY ANGIOGRAPHY;  Surgeon: Nelva Bush, MD;  Location: [facility]  INVASIVE CV LAB;  Service: Cardiovascular;  Laterality: Left;   nissen funduplication  [id number]   [name]   PULMONARY THROMBECTOMY N/A 03/29/2022   Procedure: PULMONARY THROMBECTOMY;  Surgeon: Algernon Huxley, MD;  Location: [place] CV [facility];  Service: Cardiovascular;  Laterality: N/A;   SEPTOPLASTY Bilateral 08/23/2019   Procedure: SEPTOPLASTY;  Surgeon: [name], MD;  Location: [place];  Service: ENT;  Laterality: Bilateral;   SPLENECTOMY     TURBINATE REDUCTION Bilateral 08/23/2019    Procedure: TURBINATE REDUCTION;  Surgeon: [name], MD;  Location: [place];  Service: ENT;  Laterality: Bilateral;  Latex sensitivity sleep apnea   Social History   Socioeconomic History   Marital status: Married    Spouse name: Not on file   Number of children: 2   Years of education: 14   Highest education level: Not on file  Occupational History   Occupation: Shop Printmaker: [employer]    Comment: [employer]  Tobacco Use   Smoking status: Never    Passive exposure: Past   Smokeless tobacco: Never  Vaping Use   Vaping Use: Never used  Substance and Sexual Activity   Alcohol use: No   Drug use: No   Sexual activity: Yes  Other Topics Concern   Not on file  Social History Narrative   Not on file   Social Determinants of Health   Financial Resource Strain: Low Risk  (11/08/2022)   Overall Financial Resource Strain (CARDIA)    Difficulty of Paying Living Expenses: Not hard at all  Food Insecurity: No Food Insecurity (11/08/2022)   Hunger Vital Sign    Worried About Running Out of Food in the Last Year: Never true    Ran Out of Food in the Last Year: Never true  Transportation Needs: No Transportation Needs (11/08/2022)   PRAPARE - Hydrologist (Medical): No    Lack of Transportation (Non-Medical): No  Physical Activity: Unknown (11/08/2022)   Exercise Vital Sign    Days of Exercise per Week: 4 days    Minutes of Exercise per Session: Not on file  Stress: No Stress Concern Present (11/08/2022)   [place]    Feeling of Stress : Not at all  Social Connections: Unknown (11/08/2022)   Social Connection and Isolation Panel [NHANES]    Frequency of Communication with Friends and Family: Not on file    Frequency of Social Gatherings with Friends and Family: Not on file    Attends Religious Services: Not on file    Active Member of Clubs or  Organizations: Not on file    Attends Archivist Meetings: Not on file    Marital Status: Married   Family History  Problem Relation Age of Onset   Coronary artery disease Father 71       CABG   Aortic aneurysm Father 7       repaired   Hyperlipidemia Father    Lung cancer Mother    Hypertension Mother    Cancer Mother        bladder   COPD Mother    Stomach cancer Maternal Grandfather    Allergies  Allergen Reactions   Avelox [Moxifloxacin] Hives and Swelling    Severe facial swelling   Gadolinium Derivatives Swelling    (OK with pre-medication) (OK with pre-medication)   Contrast Media [Iodinated Contrast Media] Swelling    (OK  with pre-medication)   Crestor [Rosuvastatin Calcium]     myalgia   Morphine And Related Itching   Niacin     Other reaction(s): Unknown   Niacin-Simvastatin Er     myalgia   Latex Rash    Gloves cause raxh   Tape Dermatitis   Tapentadol Dermatitis   Wound Dressing Adhesive Dermatitis   Prior to Admission medications   Medication Sig Start Date End Date Taking? Authorizing Provider  atorvastatin (LIPITOR) 20 MG tablet Take 1 tablet (20 mg total) by mouth daily. Patient taking differently: Take 20 mg by mouth every morning. 10/19/22 02/22/23 Yes End, Harrell Gave, MD  carvedilol (COREG) 12.5 MG tablet Take 12.5 mg by mouth 2 (two) times daily with a meal.   Yes [provider]  cyanocobalamin (VITAMIN B12) 1000 MCG/ML injection INJECT 1ML INTO THE MUSCLE EVERY 30 DAYS 01/24/23  Yes [name], MD  DULoxetine (CYMBALTA) 20 MG capsule Take 20 mg by mouth as needed. 12/08/22  Yes [provider]  ezetimibe (ZETIA) 10 MG tablet Take 1 tablet by mouth every morning. 11/24/20  Yes [provider]  famotidine (PEPCID) 20 MG tablet Take 20 mg by mouth 2 (two) times daily.   Yes [provider]  hydrochlorothiazide (MICROZIDE) 12.5 MG capsule Take 1 capsule (12.5 mg total) by mouth daily. Patient taking  differently: Take 12.5 mg by mouth every morning. 01/24/23  Yes [name], MD  losartan (COZAAR) 100 MG tablet TAKE ONE TABLET BY MOUTH ONE TIME DAILY Patient taking differently: Take 100 mg by mouth at bedtime. 01/06/23  Yes [name], MD  Multiple Vitamins-Minerals (MULTIVITAMIN WITH MINERALS) tablet Take 1 tablet by mouth daily.   Yes [provider]  omeprazole (PRILOSEC) 40 MG capsule Take 40 mg by mouth every morning. 02/17/21  Yes [provider]  ondansetron (ZOFRAN) 4 MG tablet Take 1 tablet (4 mg total) by mouth every 8 (eight) hours as needed. 01/24/23  Yes [name], MD  rOPINIRole (REQUIP) 0.25 MG tablet Take 0.25 mg by mouth in the morning and at bedtime.  03/26/20  Yes [provider]  triamcinolone (NASACORT) 55 MCG/ACT AERO nasal inhaler Place 2 sprays into the nose every 8 (eight) hours as needed.   Yes [provider]  zolpidem (AMBIEN CR) 12.5 MG CR tablet TAKE ONE TABLET BY MOUTH AT BEDTIME AS NEEDED FOR SLEEP 08/24/22  Yes Dutch Quint B, FNP  albuterol (VENTOLIN HFA) 108 (90 Base) MCG/ACT inhaler Inhale 2 puffs into the lungs every 6 (six) hours as needed for wheezing or shortness of breath. 02/07/23   Burnard Hawthorne, FNP  ELIQUIS 5 MG TABS tablet TAKE ONE TABLET BY MOUTH TWICE A DAY 11/02/22   [name], MD  eszopiclone 3 MG TABS Take 1 tablet (3 mg total) by mouth at bedtime. Alternating with Lorrin Mais 11/09/22   [name], MD  furosemide (LASIX) 40 MG tablet Take 40 mg by mouth as needed. 09/16/20   [provider]  nitroGLYCERIN (NITROSTAT) 0.4 MG SL tablet Place 1 tablet (0.4 mg total) under the tongue every 5 (five) minutes as needed for chest pain. Maximum of 3 doses. 11/30/17   End, Harrell Gave, MD  ONETOUCH VERIO test strip USE TO CHECK BLOOD SUGAR AS NEEDED 06/18/19   [name], MD  Prucalopride Succinate (MOTEGRITY) 2 MG TABS Take 2 mg by mouth daily as needed (constipation).    [provider]  Syringe/Needle, Disp, (SYRINGE 3CC/25GX1")  25G X 1" 3 ML MISC Use for b12 injections 03/15/18   [name], MD  tiZANidine (ZANAFLEX) 4 MG tablet TAKE 1/2 TO 1 TABLET BY MOUTH AT BEDTIME AS NEEDED FOR MUSCLE SPASM 01/14/23   [name], MD     Positive ROS: All other systems have been reviewed and were otherwise negative with the exception of those mentioned in the HPI and as above.  Physical Exam: General: Alert, no acute distress Cardiovascular: Regular rate and rhythm, no murmurs rubs or gallops.  No pedal edema Respiratory: Clear to auscultation bilaterally, no wheezes rales or rhonchi. No cyanosis, no use of accessory musculature GI: No organomegaly, abdomen is soft and non-tender nondistended with positive bowel sounds. Skin: Skin intact, no lesions within the operative field. Neurologic: Sensation intact distally Psychiatric: Patient is competent for consent with normal mood and affect Lymphatic: No cervical lymphadenopathy  MUSCULOSKELETAL: Left shoulder: Patient can forward elevate to 150 degrees and abducts to approximately 90 to 100 degrees before experiencing pain. He has mild weakness with shoulder abduction and has pain with a downward directed force on his abducted shoulder. He has positive impingement signs, but no apprehension or instability. He has full digital wrist and elbow range of motion, intact sensation to light touch and a palpable radial pulse. Patient has an equivocal O'Brien's test.   MRI LEFT SHOULDER  05/12/22 1. Frayed 45m interstitial tear supraspinatus insertion approximately 50% of the tendon thickness.  Mild bursitis. 2. Frayed partial thickness tearing articular surface fibers subscapularis tendon less than 50% of the tendon thickness. 3. Glenohumeral arthropathy.  Chronic labrum tear.  Intermediate grade chondromalacia humerus and glenoid.  Complex effusion.  Debris and loose bodies.   Assessment: Left Shoulder Rotator Cuff  Tear  Plan: Plan for Procedure(s): Left shoulder arthroscopy with mini open rotator cuff repair and possible biceps tenodesis  I reviewed the details of the operation as well as the postoperative course with the patient and his wife who was at the bedside this morning.  A preop history and physical was performed at the bedside.  I marked the left shoulder according to hospital's quickset of surgery protocol.  I discussed with the patient the surgical plan including the possible need for biceps tenodesis versus tenotomy.  He is aware of the possible risk of Popeye deformity of the biceps in the event of a tenotomy.1  I discussed the risks and benefits of surgery. The risks include but are not limited to infection, bleeding, nerve or blood vessel injury, joint stiffness or loss of motion, persistent pain, weakness or instability, will you have the repair and hardware failure and the need for further surgery.  Patient understood these risks and wished to proceed.     KThornton Park MD   02/22/2023 8:03 AM

## 2023-02-22 NOTE — Discharge Instructions (Addendum)
AMBULATORY SURGERY  DISCHARGE INSTRUCTIONS   The drugs that you were given will stay in your system until tomorrow so for the next 24 hours you should not:  Drive an automobile Make any legal decisions Drink any alcoholic beverage   You may resume regular meals tomorrow.  Today it is better to start with liquids and gradually work up to solid foods.  You may eat anything you prefer, but it is better to start with liquids, then soup and crackers, and gradually work up to solid foods.   Please notify your doctor immediately if you have any unusual bleeding, trouble breathing, redness and pain at the surgery site, drainage, fever, or pain not relieved by medication. DO NOT REMOVE GREEN TEAL ARM BAND FOR 4 DAYS    Additional Instructions:  POLAR CARE INFORMATION  [email or web address]  How to use [place] Cold Therapy System?  YouTube   [email or web address]  OPERATING INSTRUCTIONS  Start the product With dry hands, connect the transformer to the electrical connection located on the top of the cooler. Next, plug the transformer into an appropriate electrical outlet. The unit will automatically start running at this point.  To stop the pump, disconnect electrical power.  Unplug to stop the product when not in use. Unplugging the Polar Care unit turns it off. Always unplug immediately after use. Never leave it plugged in while unattended. Remove pad.    FIRST ADD WATER TO FILL LINE, THEN ICE---Replace ice when existing ice is almost melted  1 Discuss Treatment with your [place] Practitioner and Use Only as Prescribed 2 Apply Insulation Barrier & Cold Therapy Pad 3 Check for Moisture 4 Inspect Skin Regularly  Tips and Trouble Shooting Usage Tips 1. Use cubed or chunked ice for optimal performance. 2. It is recommended to drain the Pad between uses. To drain the pad, hold the Pad upright with the hose pointed toward the ground. Depress  the black plunger and allow water to drain out. 3. You may disconnect the Pad from the unit without removing the pad from the affected area by depressing the silver tabs on the hose coupling and gently pulling the hoses apart. The Pad and unit will seal itself and will not leak. Note: Some dripping during release is normal. 4. DO NOT RUN PUMP WITHOUT WATER! The pump in this unit is designed to run with water. Running the unit without water will cause permanent damage to the pump. 5. Unplug unit before removing lid.  TROUBLESHOOTING GUIDE Pump not running, Water not flowing to the pad, Pad is not getting cold 1. Make sure the transformer is plugged into the wall outlet. 2. Confirm that the ice and water are filled to the indicated levels. 3. Make sure there are no kinks in the pad. 4. Gently pull on the blue tube to make sure the tube/pad junction is straight. 5. Remove the pad from the treatment site and ll it while the pad is lying at; then reapply. 6. Confirm that the pad couplings are securely attached to the unit. Listen for the double clicks (Figure 1) to confirm the pad couplings are securely attached.  Leaks    Note: Some condensation on the lines, controller, and pads is unavoidable, especially in warmer climates. 1. If using a [place] Polar Care Cold Therapy unit with a detachable Cold Therapy Pad, and a leak exists (other than condensation on the lines) disconnect the pad couplings. Make sure the silver tabs on the couplings are depressed before  reconnecting the pad to the pump hose; then confirm both sides of the coupling are properly clicked in. 2. If the coupling continues to leak or a leak is detected in the pad itself, stop using it and call [facility] at [phone].  Cleaning After use, empty and dry the unit with a soft cloth. Warm water and mild detergent may be used occasionally to clean the pump and tubes.  WARNING: The [place] can be cold enough to cause  serious injury, including full skin necrosis. Follow these Operating Instructions, and carefully read the Product Insert (see pouch on side of unit) and the Cold Therapy Pad Fitting Instructions (provided with each Cold Therapy Pad) prior to use.       SHOULDER SLING IMMOBILIZER   VIDEO Slingshot 2 Shoulder Brace Application - YouTube ---[email or web address]  INSTRUCTIONS While supporting the injured arm, slide the forearm into the sling. Wrap the adjustable shoulder strap around the neck and shoulders and attach the strap end to the sling using  the "alligator strap tab."  Adjust the shoulder strap to the required length. Position the shoulder pad behind the neck. To secure the shoulder pad location (optional), pull the shoulder strap away from the shoulder pad, unfold the hook material on the top of the pad, then press the shoulder strap back onto the hook material to secure the pad in place. Attach the closure strap across the open top of the sling. Position the strap so that it holds the arm securely in the sling. Next, attach the thumb strap to the open end of the sling between the thumb and fingers. After sling has been fit, it may be easily removed and reapplied using the quick release buckle on shoulder strap. If a neutral pillow or 15 abduction pillow is included, place the pillow at the waistline. Attach the sling to the pillow, lining up hook material on the pillow with the loop on sling. Adjust the waist strap to fit.  If waist strap is too long, cut it to fit. Use the small piece of double sided hook material (located on top of the pillow) to secure the strap end. Place the double sided hook material on the inside of the cut strap end and secure it to the waist strap.     If no pillow is included, attach the waist strap to the sling and adjust to fit.    Washing Instructions: Straps and sling must be removed and cleaned regularly depending on your activity  level and perspiration. Hand wash straps and sling in cold water with mild detergent, rinse, air dry   Please contact your physician with any problems or Same Day Surgery at [phone], Monday through Friday 6 am to 4 pm, or [place] at [facility] number at [phone].

## 2023-02-22 NOTE — Anesthesia Procedure Notes (Signed)
Anesthesia Regional Block: Interscalene brachial plexus block   Pre-Anesthetic Checklist: , timeout performed,  Correct Patient, Correct Site, Correct Laterality,  Correct Procedure, Correct Position, site marked,  Risks and benefits discussed,  Surgical consent,  Pre-op evaluation,  At surgeon's request and post-op pain management  Laterality: Left  Prep: chloraprep       Needles:  Injection technique: Single-shot  Needle Type: Echogenic Needle     Needle Length: 4cm  Needle Gauge: 25     Additional Needles:   Procedures:,,,, ultrasound used (permanent image in chart),,    Narrative:  Start time: 02/22/2023 7:50 AM End time: 02/22/2023 7:52 AM Injection made incrementally with aspirations every 5 mL.  Performed by: Personally  Anesthesiologist: Dimas Millin, MD  Additional Notes: Patient's chart reviewed and they were deemed appropriate candidate for procedure, at surgeon's request. Patient educated about risks, benefits, and alternatives of the block including but not limited to: temporary or permanent nerve damage, bleeding, infection, damage to surround tissues, pneumothorax, hemidiaphragmatic paralysis, unilateral Horner's syndrome, block failure, local anesthetic toxicity. Patient expressed understanding. A formal time-out was conducted consistent with institution rules.  Monitors were applied, and minimal sedation used (see nursing record). The site was prepped with skin prep and allowed to dry, and sterile gloves were used. A high frequency linear ultrasound probe with probe cover was utilized throughout. C5-7 nerve roots located and appeared anatomically normal, local anesthetic injected around them, and echogenic block needle trajectory was monitored throughout. Aspiration performed every 23m. Lung and blood vessels were avoided. All injections were performed without resistance and free of blood and paresthesias. The patient tolerated the procedure well.  Injectate: 281m exparel + 1066m.5% bupivacaine

## 2023-02-22 NOTE — Anesthesia Postprocedure Evaluation (Signed)
Anesthesia Post Note  Patient: Brian Atkins  Procedure(s) Performed: SHOULDER ARTHROSCOPY WITH OPEN ROTATOR CUFF REPAIR, DISTAL CLAVICLE ACROMINECTOMY, SUBACROMIAL DECOMPRESSION, DISTAL CLAVICLE EXCISION, AND BICEPS TENODESIS (Left: Shoulder) ARTHROSCOPY SHOULDER (Left: Shoulder)  Patient location during evaluation: PACU Anesthesia Type: General Level of consciousness: awake and alert Pain management: pain level controlled Vital Signs Assessment: post-procedure vital signs reviewed and stable Respiratory status: spontaneous breathing, nonlabored ventilation, respiratory function stable and patient connected to nasal cannula oxygen Cardiovascular status: blood pressure returned to baseline and stable Postop Assessment: no apparent nausea or vomiting Anesthetic complications: no  No notable events documented.   Last Vitals:  Vitals:   02/22/23 1145 02/22/23 1200  BP: 130/79 137/86  Pulse: 75 73  Resp: (!) 23 (!) 25  Temp:    SpO2: 100% 94%    Last Pain:  Vitals:   02/22/23 1145  TempSrc:   PainSc: [place]

## 2023-02-22 NOTE — Transfer of Care (Signed)
Immediate Anesthesia Transfer of Care Note  Patient: Brian Atkins  Procedure(s) Performed: SHOULDER ARTHROSCOPY WITH OPEN ROTATOR CUFF REPAIR, DISTAL CLAVICLE ACROMINECTOMY, SUBACROMIAL DECOMPRESSION, DISTAL CLAVICLE EXCISION, AND BICEPS TENODESIS (Left: Shoulder) ARTHROSCOPY SHOULDER (Left: Shoulder)  Patient Location: PACU  Anesthesia Type:General  Level of Consciousness: drowsy  Airway & Oxygen Therapy: Patient Spontanous Breathing and Patient connected to face mask oxygen  Post-op Assessment: Report given to RN and Post -op Vital signs reviewed and stable  Post vital signs: Reviewed and stable  Last Vitals:  Vitals Value Taken Time  BP 128/81 02/22/23 1133  Temp    Pulse 68 02/22/23 1134  Resp 17 02/22/23 1134  SpO2 98 % 02/22/23 1134  Vitals shown include unvalidated device data.  Last Pain:  Vitals:   02/22/23 0651  TempSrc: Oral  PainSc: 0-No pain         Complications: No notable events documented.

## 2023-02-22 NOTE — Op Note (Signed)
02/22/2023  6:02 PM  PATIENT:  Brian Atkins  67 y.o. male  PRE-OPERATIVE DIAGNOSIS:  Left Shoulder Rotator Cuff Tear  POST-OPERATIVE DIAGNOSIS:  Left Shoulder Rotator Cuff Tear, SLAP tear, glenohumeral joint arthrosis, subacromial impingement and acromioclavicular arthrosis  PROCEDURE: Left shoulder arthroscopic chondroplasty of the humeral head, biceps tenodesis, subacromial decompression, distal clavicle excision and mini open rotator cuff repair  SURGEON:  Surgeon(s) and Role:    Thornton Park, MD - Primary  ANESTHESIA:   general and paracervical block  PREOPERATIVE INDICATIONS:  Brian Atkins is a  67 y.o. male with a diagnosis of Left Shoulder Rotator Cuff  tear, informed by MRI, who failed conservative treatment and elected for surgical fixation of his rotator cuff tear.    The risks benefits and alternatives were discussed with the patient preoperatively including but not limited to the risks of infection, bleeding, nerve injury, persistent pain or weakness, failure of the hardware, re-tear of the rotator cuff, popeye deformity of the biceps muscle and the need for further surgery. Patient understood these risks and wished to proceed.  OPERATIVE IMPLANTS: [place] Multifix anchors x 1 & Smith and Nephew Q Fix anchor x 1, Arthrex loop and tack system with push lock anchor for biceps tenodesis  OPERATIVE FINDINGS: High-grade bursal sided partial thickness tear of the supraspinatus tendon into the infraspinatus.  Extensive chondromalacia of the humeral head.  Type II SLAP tear and partial tear of the subscapularis with medialized biceps tendon.  Extensive subacromial bursitis with a downward sloping acromion and acromioclavicular joint arthrosis.  OPERATIVE PROCEDURE: The patient was met in the preoperative area. The right shoulder was signed with the word yes and my initials according the hospital's correct site of surgery protocol. The patient underwent placement of a  interscalene block with Exparel by the anesthesia service.  The patient was then brought to the operating room where they underwent general endotracheal intubation.  The patient was placed in a beachchair position.  A spider arm positioner was used for this case. Examination under anesthesia revealed no limitation of motion or instability with load shift testing. The patient had a negative sulcus sign.  The patient was prepped and draped in a sterile fashion. A timeout was performed to verify the patient's name, date of birth, medical record number, correct site of surgery and correct procedure to be performed there was also used to verify the patient received antibiotics that all appropriate instruments, implants and radiographs studies were available in the room. Once all in attendance were in agreement case began.  Bony landmarks were drawn out with a surgical marker along with proposed arthroscopy incisions. These were pre-injected with 1% lidocaine plain. An 11 blade was used to establish a posterior portal through which the arthroscope was placed in the glenohumeral joint. A full diagnostic examination of the shoulder was performed.  the anterior portal was established under direct visualization with an 18-gauge spinal needle. A 5.75 the medial arthroscopic cannula was placed through this anterior portal.   Patient was found to have extensive chondromalacia with loose cartilage flaps on the humeral head.  Patient underwent an arthroscopic chondroplasty of the humeral head with a tapered shaver blade.  Patient had extensive fraying of the anterior and superior labrum.  This was also debrided with a tapered shaver blade.  A probe was used to evaluate the superior labrum and the patient was found to have a type II SLAP tear.  No degenerative changes seen in the biceps tendon  as well.  The decision was therefore made to perform an arthroscopic biceps tenodesis using the Arthrex loop and tack system.  A  fiber loop was placed around the biceps tendon from the anterior portal.  A "luggage tag" suture configuration was placed around the biceps using this fiber loop.  The end of the fiber loop was then placed back into the joint and retrieved with an Arthrex grasper after it penetrated through the biceps tendon distal to the luggage tag suture.  A drill was then used to create a hole at the top of the into tuberous groove.  A nitinol wire was placed in the hole to allow continued visualization of the hole.  The biceps suture was then placed into a push lock anchor which was inserted into the hole and gently malleted into position.  An thorascopic suture cutter was used to remove excess suture tail.  The biceps tenodesis was probed for stability.  Final arthroscopic images of the tenodesis were taken.  A 0 PDS suture was then placed through the supraspinatus and retrieved with a [place] grasper to the anterior portal.  The arthroscope was then placed in the subacromial space. A lateral portal was then established using an 18-gauge spinal needle for localization. Extensive bursitis was encountered and debrided using a the 90 degree Smith & Nephew werewolf wand and tapered shaver blade. A subacromial decompression was performed via the lateral portal using a 4.5 mm bone cutter shaver blade. A distal clavicle excision was performed from the anterior portal using the same shaver blade.    The 0 PDS suture was identified on the bursal first of the rotator cuff.  The high-grade partial thickness tear was debrided using the tapered shaver blade as well as a full-radius shaver blade.  A single Smith & Nephew Perfect Pass suture was placed in the lateral border of the rotator cuff tear.  Final arthroscopic images were taken and all arthroscopic instruments were then removed and the mini-open portion of the procedure began.   A saber-type incision was made along the lateral border of the acromion. The deltoid muscle  was identified and split in line with its fibers which allowed visualization of the rotator cuff.  The Perfect Pass suture previously placed in the lateral border of the rotator cuff was brought out through the deltoid split.   The greater tuberosity was debrided using a 4.5 mm bone shaver blade to remove all remaining torn fibers of the rotator cuff.  Debridement was performed until punctate bleeding was seen at the greater tuberosity footprint, which will allow for rotator cuff healing.    An additional perfect pass suture was placed through the lateral border of the supraspinatus tear. The lateral edge of the rotator cuff was debrided with a #15 blade until healthy, viable tissue remained.  A single Q fix anchor was then placed at the articular margin of the humeral head. The 4 limbs of the Q fix anchor were then passed medially through the rotator cuff with a perfect pass suture passer. These were clamped with a hemostat for later medial row fixation.    The two perfect pass sutures from the lateral border of the rotator cuff were then anchored to the greater tuberosity of the humeral head using a single [employer] Multifix anchor. The sutures loaded into the Multifix anchor were tensioned to allow for anatomic reduction of the rotator cuff to the greater tuberosity footprint and the MultiFix anchor was screwed into position by hand.  The medial row repair was then completed using an arthroscopic knot tying technique with the sutures from the Q fix anchor.  Once all sutures were tied down, arthroscopic images of the double row repair were taken with the arthroscope both externally and arthroscopically from the glenohumeral joint.  All incisions were copiously irrigated. The deltoid fascia was repaired using a 0 Vicryl suturean interrupted fashion.  The subcutaneous tissue of all incisions were closed with a 2-0 Vicryl. Skin closure for the arthroscopic incisions was performed with 4-0 nylon. The skin  edges of the saber incision were approximated with a running 4-0 undyed Monocryl.  A dry sterile dressing was applied.  The patient was placed in an abduction sling, with a Polar Care sleeve.  All sharp and instrument counts were correct at the conclusion of the case. I was scrubbed and present for the entire case. I spoke with the patient's wife postoperatively to let her know the case had been performed without complication and the patient was stable in recovery room.

## 2023-02-22 NOTE — Anesthesia Procedure Notes (Signed)
Procedure Name: Intubation Date/Time: 02/22/2023 8:12 AM  Performed by: Lorie Apley, CRNAPre-anesthesia Checklist: Patient identified, Patient being monitored, Timeout performed, Emergency Drugs available and Suction available Patient Re-evaluated:Patient Re-evaluated prior to induction Oxygen Delivery Method: Circle system utilized Preoxygenation: Pre-oxygenation with 100% oxygen Induction Type: IV induction Ventilation: Mask ventilation without difficulty Laryngoscope Size: Mac, 3 and McGraph Grade View: Grade I Tube type: Oral Tube size: 7.5 mm Number of attempts: 1 Airway Equipment and Method: Stylet Placement Confirmation: ETT inserted through vocal cords under direct vision, positive ETCO2 and breath sounds checked- equal and bilateral Secured at: 21 cm Tube secured with: Tape Dental Injury: Teeth and Oropharynx as per pre-operative assessment

## 2023-02-23 ENCOUNTER — Encounter: Payer: Self-pay | Admitting: Orthopedic Surgery

## 2023-03-12 ENCOUNTER — Other Ambulatory Visit: Payer: Self-pay | Admitting: Family

## 2023-03-15 ENCOUNTER — Other Ambulatory Visit: Payer: Self-pay

## 2023-03-15 NOTE — Telephone Encounter (Signed)
Refilled: 08/24/2022 Last OV: 01/24/2023 Next OV: not scheduled

## 2023-03-16 MED ORDER — ZOLPIDEM TARTRATE ER 12.5 MG PO TBCR: 12.5000 mg | EXTENDED_RELEASE_TABLET | Freq: Every evening | ORAL | 5 refills | Status: DC | PRN

## 2023-04-05 ENCOUNTER — Other Ambulatory Visit: Payer: Self-pay | Admitting: Internal Medicine

## 2023-04-05 NOTE — Progress Notes (Unsigned)
Cardiology Office Note    Date:  04/06/2023   ID:  Brian Atkins, DOB May 06, 1956, MRN [id number]  PCP:  Sherlene Shams, MD  Cardiologist:  Yvonne Kendall, MD  Electrophysiologist:  None   Chief Complaint: Follow-up  History of Present Illness:   Brian Atkins is a 67 y.o. male with history of nonobstructive CAD by LHC in 11/2017 and coronary CTA in 11/2019, marginal zone B-cell lymphoma, restrictive lung disease, SBO requiring emergent surgery, PE with prior DVT/PE, HTN, peripheral neuropathy, sleep apnea on CPAP, and GERD status post Nissen fundoplication who presents for follow-up of nonobstructive CAD.   He was evaluated in 11/2017 for chest pain, palpitations, headaches, and elevated BP.  He subsequently underwent LHC which revealed moderate nonobstructive CAD involving the proximal/mid LAD and mid RCA.  Both lesions were not hemodynamically significant by fractional flow reserve (FFR of LAD 0.84, FFR RCA 0.94).  LVEF greater than 65% with a moderately elevated LVEDP consistent with diastolic dysfunction.  Medical therapy was recommended.  Echo 12/29/17 showed an EF of 60-65%, normal wall motion, normal LV diastolic function, and a mildly dilated LA.  He was seen in 11/2019 with recurrent chest pain and underwent coronary CTA which showed a calcium score of 496 and was the 86th percentile.  He had mild diffuse nonobstructive CAD with calcified plaque noted in the left main, LAD, LCx, and RCA with a maximum identified stenosis of 25 to 49%.  Medical therapy was recommended.  He was diagnosed with Covid in 12/2019, and did not require hospital admission.  He was seen in 01/2020 continuing to note persistent exertional dyspnea, fatigue, and cough following his COVID-19 infection.  He reported two-pillow orthopnea and a 5 pound weight gain despite poor appetite since contracting Covid.  In this setting, he underwent echo in 01/2020, which showed a normal LVEF with grade 1 diastolic dysfunction and no  significant valvular abnormalities.  He was evaluated by pulmonology in 02/2020 who felt like his symptoms were likely due to uncontrolled sleep apnea, GERD, and chronic rhinitis.  PFTs were recommended, and ultimately completed in 11/2020, which showed moderate restrictive lung disease with consideration for ILD.  He was seen by cardiology in 03/2020 noting multiple complaints including a several week history of numbness in his legs and feet that was most pronounced when lying down, which is followed by neurology.  He also noted exertional dyspnea that varied from one day to the next.  In the setting of lower extremity swelling, he had increased his Lasix to 40 mg daily and decreased his amlodipine with improvement in symptoms.  He was continued on Lasix 40 mg daily.  His leg pain was most consistent with a neuropathic process though given his risk factors he underwent lower extremity ABIs which were normal.  He was seen in 07/2020 reporting palpitations with ZIO monitor at that demonstrating sinus rhythm with rare PACs and PVCs with no sustained arrhythmia or prolonged pauses.    He was admitted to the hospital in 03/2022 with a bowel obstruction with emergent surgery and PE status post thrombectomy.  During that admission, echo demonstrated an EF of 60 to 65%, no regional wall motion abnormalities, grade 2 diastolic dysfunction, normal RV systolic function with mildly enlarged ventricular cavity size, and aortic valve sclerosis without evidence of stenosis.   He was seen in the office in 07/2022 and was largely recovered from his admission in the spring.  However, he did feel more short of breath and  reported intermittent chest tightness with exertion.  Given symptoms, he underwent echo in 08/2022 which showed an EF of 60 to 65%, no regional wall motion abnormalities, mild LVH, grade 2 diastolic dysfunction, normal RV systolic function and ventricular cavity size, no significant valvular abnormalities, and an  estimated right atrial pressure of 3 mmHg.  Lexiscan MPI in 08/2022 showed no evidence of ischemia.   He was seen in the office on 10/21/2022 and was without symptoms of angina or decompensation with stable dyspnea.  He did note an increase in palpitation burden occurring on an almost daily basis and lasting 5 to 10 seconds in duration.  Zio patch showed a predominant rhythm of sinus with an average rate of 66 bpm (range 47 to 128 bpm, 1 run of SVT lasting 4 beats with a maximum rate of 128 bpm, 1 pause occurred lasting 3.3 seconds at 7:48 AM (Sunday, 11/07/2022) and was asymptomatic, and rare atrial/ventricular ectopy.  Patient triggered events corresponded to sinus rhythm and PVCs.  He was last seen in the office in 11/2022 and remained without symptoms of angina or decompensation.  He noted improvement in palpitation burden.  He noted some intermittent unsteadiness with recommendation to reduce carvedilol to 6.25 mg twice daily.  He was felt to be acceptable risk for noncardiac shoulder surgery.  He comes in doing reasonably well from a cardiac perspective he is without symptoms of angina or cardiac decompensation.  He does continue to note some palpitations described as "heart pounding."  These episodes of palpitations were worse when we underwent a trial of reduced carvedilol as outlined above.  In this setting, he has gone back up to 12.5 mg of carvedilol twice daily.  He does continue to note some exertional fatigue and shortness of breath.  No presyncope or syncope.  No significant lower extremity swelling.  He continues to undergo PT following his left shoulder surgery.  He may need right shoulder surgery down the road.  He reports adherence to ezetimibe, though does frequently miss atorvastatin secondary to brain fog and myalgias associated with medication.   Labs independently reviewed: 01/2023 - Hgb 15.0, PLT 405, TSH normal, A1c 6.0, potassium 3.8, BUN 16, serum creatinine 1.02, albumin 4.4,  AST/ALT normal, TC 250, TG 234, HDL 33, direct LDL 173  Past Medical History:  Diagnosis Date   Complication of anesthesia    a.) delayed emergence   Coronary artery disease, non-occlusive    a. 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) etiology felt to be related to XRT; "two hours after receiving bilateral orbital radiation patient was noted to have increased swelling of the orbital area which progressed to include the cheek and lips, an intense itching of the eyes bilaterally";  reaction resulted in intubation (06/27/2014 - 07/02/2014)   Insomnia    a.) on prescribed hypnotics (zolpidem + eszopiclone)   Labile hypertension    Left rotator cuff tear    Long term current use of anticoagulant    a.) apixaban   Medial epicondylitis of right elbow    Meralgia paresthetica of right side    Microscopic hematuria    Myalgia due to statin    Ocular migraine    OSA on CPAP    Pneumonia    Prediabetes    PSVT (paroxysmal  supraventricular tachycardia)    Pulmonary emboli 03/28/2022   a. assoicated (+) RIGHT heart strain; required mechanical thrombectomy   RLS (restless legs syndrome)    a.) on ropinirole   Splenic vein thrombosis    a.) treated with enoxaparin BID x 6 months    Past Surgical History:  Procedure Laterality Date   BRONCHIAL WASHINGS N/A 08/21/2021   Procedure: BRONCHIAL WASHINGS;  Surgeon: Vida Rigger, MD;  Location: [facility];  Service: Thoracic;  Laterality: N/A;   COLONOSCOPY     CORONARY PRESSURE/FFR STUDY N/A 12/07/2017   Procedure: INTRAVASCULAR PRESSURE WIRE/FFR STUDY;  Surgeon: Yvonne Kendall, MD;  Location: [facility];  Service: Cardiovascular;  Laterality: N/A;   FLEXIBLE BRONCHOSCOPY N/A 08/21/2021   Procedure: FLEXIBLE BRONCHOSCOPY;  Surgeon: Vida Rigger, MD;  Location: [facility];  Service: Thoracic;  Laterality: N/A;   LEFT HEART CATH AND CORONARY ANGIOGRAPHY Left 12/07/2017   Procedure: LEFT HEART CATH AND CORONARY ANGIOGRAPHY;  Surgeon: Yvonne Kendall, MD;  Location: [facility];  Service: Cardiovascular;  Laterality: Left;   nissen funduplication  2009   [name]   PULMONARY THROMBECTOMY N/A 03/29/2022   Procedure: PULMONARY THROMBECTOMY;  Surgeon: Annice Needy, MD;  Location: [facility];  Service: Cardiovascular;  Laterality: N/A;   SEPTOPLASTY Bilateral 08/23/2019   Procedure: SEPTOPLASTY;  Surgeon: Vernie Murders, MD;  Location: [facility];  Service: ENT;  Laterality: Bilateral;   SHOULDER ARTHROSCOPY Left 02/22/2023   Procedure: ARTHROSCOPY SHOULDER;  Surgeon: Juanell Fairly, MD;  Location: [facility];  Service: Orthopedics;  Laterality: Left;   SHOULDER ARTHROSCOPY WITH OPEN ROTATOR CUFF REPAIR AND DISTAL CLAVICLE ACROMINECTOMY Left 02/22/2023   Procedure: SHOULDER ARTHROSCOPY WITH OPEN ROTATOR CUFF REPAIR, DISTAL CLAVICLE ACROMINECTOMY, SUBACROMIAL DECOMPRESSION, DISTAL CLAVICLE EXCISION, AND BICEPS TENODESIS;  Surgeon:  Juanell Fairly, MD;  Location: [facility];  Service: Orthopedics;  Laterality: Left;   SPLENECTOMY     TURBINATE REDUCTION Bilateral 08/23/2019   Procedure: TURBINATE REDUCTION;  Surgeon: Vernie Murders, MD;  Location: [facility];  Service: ENT;  Laterality: Bilateral;  Latex sensitivity sleep apnea    Current Medications: Current Meds  Medication Sig   albuterol (VENTOLIN HFA) 108 (90 Base) MCG/ACT inhaler Inhale 2 puffs into the lungs every 6 (six) hours as needed for wheezing or shortness of breath.   Alirocumab (PRALUENT) 150 MG/ML SOAJ Inject 1 mL (150 mg total) into the skin every 14 (fourteen) days.   cyanocobalamin (VITAMIN B12) 1000 MCG/ML injection INJECT INTO THE MUSCLE EVERY 30 DAYS   ELIQUIS 5 MG TABS tablet TAKE ONE TABLET BY MOUTH TWICE A DAY   eszopiclone 3  MG TABS Take 1 tablet (3 mg total) by mouth at bedtime. Alternating with ambien   ezetimibe (ZETIA) 10 MG tablet Take 1 tablet by mouth every morning.   famotidine (PEPCID) 20 MG tablet Take 20 mg by mouth 2 (two) times daily.   furosemide (LASIX) 40 MG tablet Take 40 mg by mouth as needed.   hydrochlorothiazide (MICROZIDE) 12.5 MG capsule TAKE ONE CAPSULE BY MOUTH ONE TIME DAILY   losartan (COZAAR) 100 MG tablet TAKE ONE TABLET BY MOUTH ONE TIME DAILY (Patient taking differently: Take 100 mg by mouth at bedtime.)   Multiple Vitamins-Minerals (MULTIVITAMIN WITH MINERALS) tablet Take 1 tablet by mouth daily.   nitroGLYCERIN (NITROSTAT) 0.4 MG SL tablet Place 1 tablet (0.4 mg total) under the tongue every 5 (five) minutes as needed for chest pain. Maximum of 3 doses.   omeprazole (PRILOSEC) 40 MG capsule Take 40 mg by mouth every morning.   ondansetron (ZOFRAN) 4 MG tablet Take 1 tablet (4 mg total) by mouth every 8 (eight) hours as needed for nausea or vomiting.   ONETOUCH VERIO test strip USE TO CHECK BLOOD SUGAR AS NEEDED   oxyCODONE (OXY IR/ROXICODONE) 5 MG immediate release tablet Take 1 tablet (5 mg total)  by mouth every 4 (four) hours as needed for severe pain.   Prucalopride Succinate (MOTEGRITY) 2 MG TABS Take 2 mg by mouth daily as needed (constipation).   rOPINIRole (REQUIP) 0.25 MG tablet Take 0.25 mg by mouth in the morning and at bedtime.    Syringe/Needle, Disp, (SYRINGE 3CC/25GX1") 25G X 1" 3 ML MISC Use for b12 injections   triamcinolone (NASACORT) 55 MCG/ACT AERO nasal inhaler Place 2 sprays into the nose every 8 (eight) hours as needed.   zolpidem (AMBIEN CR) 12.5 MG CR tablet Take 1 tablet (12.5 mg total) by mouth at bedtime as needed. for sleep   [DISCONTINUED] atorvastatin (LIPITOR) 20 MG tablet Take 1 tablet (20 mg total) by mouth daily. (Patient taking differently: Take 20 mg by mouth every morning.)   [DISCONTINUED] carvedilol (COREG) 12.5 MG tablet Take 12.5 mg by mouth 2 (two) times daily with a meal.    Allergies:   Avelox [moxifloxacin], Gadolinium derivatives, Contrast media [iodinated contrast media], Crestor [rosuvastatin calcium], Morphine and related, Niacin, Niacin-simvastatin er, Latex, Tape, Tapentadol, and Wound dressing adhesive   Social History   Socioeconomic History   Marital status: Married    Spouse name: Not on file   Number of children: 2   Years of education: 14   Highest education level: Not on file  Occupational History   Occupation: Shop Nurse, mental health: [employer]    Comment: [employer]  Tobacco Use   Smoking status: Never    Passive exposure: Past   Smokeless tobacco: Never  Vaping Use   Vaping Use: Never used  Substance and Sexual Activity   Alcohol use: No   Drug use: No   Sexual activity: Yes  Other Topics Concern   Not on file  Social History Narrative   Not on file   Social Determinants of Health   Financial Resource Strain: Low Risk  (11/08/2022)   Overall Financial Resource Strain (CARDIA)    Difficulty of Paying Living Expenses: Not hard at all  Food Insecurity: No Food Insecurity (11/08/2022)   Hunger Vital Sign     Worried About Running Out of Food in the Last Year: Never true    Ran Out of Food in the Last Year: Never true  Transportation Needs: No Transportation Needs (11/08/2022)   PRAPARE - Administrator, Civil Service (Medical): No    Lack of Transportation (Non-Medical): No  Physical Activity: Unknown (11/08/2022)   Exercise Vital Sign    Days of Exercise per Week: 4 days    Minutes of Exercise per Session: Not on file  Stress: No Stress Concern Present (11/08/2022)   [employer] of Occupational Health - Occupational Stress Questionnaire    Feeling of Stress : Not at all  Social Connections: Unknown (11/08/2022)   Social Connection and Isolation Panel [NHANES]    Frequency of Communication with Friends and Family: Not on file    Frequency of Social Gatherings with Friends and Family: Not on file    Attends Religious Services: Not on file    Active Member of Clubs or Organizations: Not on file    Attends Banker Meetings: Not on file    Marital Status: Married     Family History:  The patient's family history includes Aortic aneurysm (age of onset: 25) in his father; COPD in his mother; Cancer in his mother; Coronary artery disease (age of onset: 71) in his father; Hyperlipidemia in his father; Hypertension in his mother; Lung cancer in his mother; Stomach cancer in his maternal grandfather.  ROS:   12-point review of systems is negative unless otherwise noted in the HPI.   EKGs/Labs/Other Studies Reviewed:    Studies reviewed were summarized above. The additional studies were reviewed today: As above.   EKG:  EKG is not ordered today.    Recent Labs: 08/19/2022: Magnesium 2.3 01/24/2023: ALT 13; BUN 16; Creatinine, Ser 1.02; Hemoglobin 15.0; Platelets 405.0; Potassium 3.8; Sodium 142; TSH 1.75  Recent Lipid Panel    Component Value Date/Time   CHOL 250 (H) 01/24/2023 1451   TRIG 234.0 (H) 01/24/2023 1451   HDL 33.80 (L) 01/24/2023 1451   CHOLHDL  7 01/24/2023 1451   VLDL 46.8 (H) 01/24/2023 1451   LDLCALC 77 07/22/2021 1224   LDLCALC 162 (H) 10/24/2020 1608   LDLDIRECT 173.0 01/24/2023 1451    PHYSICAL EXAM:    VS:  BP (!) 134/91 (BP Location: Right Arm, Patient Position: Sitting, Cuff Size: Normal)   Pulse 77   Ht 6\' 2"  (1.88 m)   Wt 215 lb (97.5 kg)   SpO2 96%   BMI 27.60 kg/m   BMI: Body mass index is 27.6 kg/m.  Physical Exam Vitals reviewed.  Constitutional:      Appearance: He is well-developed.  HENT:     Head: Normocephalic and atraumatic.  Eyes:     General:        Right eye: No discharge.        Left eye: No discharge.  Neck:     Vascular: No JVD.  Cardiovascular:     Rate and Rhythm: Normal rate and regular rhythm.     Heart sounds: Normal heart sounds, S1 normal and S2 normal. Heart sounds not distant. No midsystolic click and no opening snap. No murmur heard.    No friction rub.  Pulmonary:     Effort: Pulmonary effort is normal. No respiratory distress.     Breath sounds: Normal breath sounds. No decreased breath sounds, wheezing or rales.  Chest:     Chest wall: No tenderness.  Abdominal:     General: There is no distension.  Musculoskeletal:     Cervical back: Normal range of motion.     Right lower leg: No  edema.     Left lower leg: No edema.     Comments: Left upper extremity in sling.  Skin:    General: Skin is warm and dry.     Nails: There is no clubbing.  Neurological:     Mental Status: He is alert and oriented to person, place, and time.  Psychiatric:        Speech: Speech normal.        Behavior: Behavior normal.        Thought Content: Thought content normal.        Judgment: Judgment normal.     Wt Readings from Last 3 Encounters:  04/06/23 215 lb (97.5 kg)  02/22/23 205 lb (93 kg)  02/07/23 210 lb 9.6 oz (95.5 kg)     ASSESSMENT & PLAN:   Nonobstructive CAD: No symptoms of angina or cardiac decompensation.  Lexiscan MPI showed no evidence of ischemia.  Prior LHC  and coronary CTA showed nonobstructive disease.  Continue aggressive risk factor modification and primary prevention including apixaban in place of aspirin given history of recurrent DVT/PE along with ezetimibe, carvedilol, losartan, and as needed SL NTG.  We are discontinuing atorvastatin as outlined below given intolerance.  Recurrent DVT/PE: Dyspnea stable.  He remains on indefinite apixaban given recurrent DVT/PE, managed by PCP.  No evidence of RV dysfunction on recent echo.  HTN: Blood pressure is mildly elevated in the office today.  Titrate carvedilol to 18.75 mg twice daily with continuation of hydrochlorothiazide and losartan.  HLD: LDL 173 in 01/2023.  Has been missing doses of atorvastatin secondary to myalgias and brain fog.  Has history of intolerance to rosuvastatin secondary to myalgias.  He does remain on ezetimibe.  Trial of Praluent 150 mg every 2 weeks.  Follow-up fasting lipid panel and LFT in 3 months.  Palpitations: Overall stable.  Carvedilol as outlined above.  Recent Zio patch showed sinus rhythm with 1 run of SVT lasting just 4 beats.  Maintain adequate hydration.   Disposition: F/u with Dr. Okey Dupre or an APP in 6 months.   Medication Adjustments/Labs and Tests Ordered: Current medicines are reviewed at length with the patient today.  Concerns regarding medicines are outlined above. Medication changes, Labs and Tests ordered today are summarized above and listed in the Patient Instructions accessible in Encounters.   Signed, Eula Listen, PA-C 04/06/2023 3:26 PM     [facility] [address] [place], [place] [address] [phone]

## 2023-04-06 ENCOUNTER — Ambulatory Visit: Payer: Medicare Other | Attending: Internal Medicine | Admitting: Physician Assistant

## 2023-04-06 ENCOUNTER — Encounter: Payer: Self-pay | Admitting: Physician Assistant

## 2023-04-06 ENCOUNTER — Ambulatory Visit: Payer: Medicare Other | Admitting: Internal Medicine

## 2023-04-06 VITALS — BP 134/91 | HR 77 | Ht 74.0 in | Wt 215.0 lb

## 2023-04-06 DIAGNOSIS — Z79899 Other long term (current) drug therapy: Secondary | ICD-10-CM | POA: Insufficient documentation

## 2023-04-06 DIAGNOSIS — I10 Essential (primary) hypertension: Secondary | ICD-10-CM | POA: Diagnosis present

## 2023-04-06 DIAGNOSIS — E785 Hyperlipidemia, unspecified: Secondary | ICD-10-CM | POA: Diagnosis present

## 2023-04-06 DIAGNOSIS — I2510 Atherosclerotic heart disease of native coronary artery without angina pectoris: Secondary | ICD-10-CM | POA: Insufficient documentation

## 2023-04-06 DIAGNOSIS — Z86711 Personal history of pulmonary embolism: Secondary | ICD-10-CM | POA: Insufficient documentation

## 2023-04-06 DIAGNOSIS — R002 Palpitations: Secondary | ICD-10-CM | POA: Insufficient documentation

## 2023-04-06 MED ORDER — PRALUENT 150 MG/ML ~~LOC~~ SOAJ: 150.0000 mg | SUBCUTANEOUS | 2 refills | Status: DC

## 2023-04-06 MED ORDER — CARVEDILOL 12.5 MG PO TABS: 18.7500 mg | ORAL_TABLET | Freq: Two times a day (BID) | ORAL | 3 refills | Status: DC

## 2023-04-06 NOTE — Patient Instructions (Signed)
Medication Instructions:  Your physician recommends the following medication changes.  STOP TAKING: Atorvastatin (Lipitor)  START TAKING: Praluent 150 mg: Inject into the skin every 14 days  INCREASE: Carvedilol (Coreg) 18.75 mg (1 1/2 tablet) twice a day   *If you need a refill on your cardiac medications before your next appointment, please call your pharmacy*   Lab Work: Your provider would like for you to return in July, 2024 to have the following labs drawn: (Lipid, LFT).   Please go to the [facility] entrance and check in at the front desk.  You do not need an appointment.  They are open from 7am-6 pm.  You will need to be fasting.   If you have labs (blood work) drawn today and your tests are completely normal, you will receive your results only by: MyChart Message (if you have MyChart) OR A paper copy in the mail If you have any lab test that is abnormal or we need to change your treatment, we will call you to review the results.   Testing/Procedures: None ordered    Follow-Up: At [facility], you and your health needs are our priority.  As part of our continuing mission to provide you with exceptional heart care, we have created designated Provider Care Teams.  These Care Teams include your primary Cardiologist (physician) and Advanced Practice Providers (APPs -  Physician Assistants and Nurse Practitioners) who all work together to provide you with the care you need, when you need it.  We recommend signing up for the patient portal called "MyChart".  Sign up information is provided on this After Visit Summary.  MyChart is used to connect with patients for Virtual Visits (Telemedicine).  Patients are able to view lab/test results, encounter notes, upcoming appointments, etc.  Non-urgent messages can be sent to your provider as well.   To learn more about what you can do with MyChart, go to [email or web address].    Your next appointment:   6  month(s)  Provider:   You may see Yvonne Kendall, MD or one of the following Advanced Practice Providers on your designated Care Team:   Nicolasa Ducking, NP Eula Listen, PA-C Cadence Fransico Michael, PA-C Charlsie Quest, NP

## 2023-04-26 ENCOUNTER — Other Ambulatory Visit: Payer: Self-pay | Admitting: Internal Medicine

## 2023-04-26 DIAGNOSIS — M5030 Other cervical disc degeneration, unspecified cervical region: Secondary | ICD-10-CM

## 2023-04-26 DIAGNOSIS — M19012 Primary osteoarthritis, left shoulder: Secondary | ICD-10-CM

## 2023-04-26 DIAGNOSIS — M25512 Pain in left shoulder: Secondary | ICD-10-CM

## 2023-04-26 DIAGNOSIS — R519 Headache, unspecified: Secondary | ICD-10-CM

## 2023-04-26 DIAGNOSIS — M542 Cervicalgia: Secondary | ICD-10-CM

## 2023-05-18 ENCOUNTER — Ambulatory Visit (INDEPENDENT_AMBULATORY_CARE_PROVIDER_SITE_OTHER): Payer: Medicare Other | Admitting: Dermatology

## 2023-05-18 VITALS — BP 121/76

## 2023-05-18 DIAGNOSIS — L820 Inflamed seborrheic keratosis: Secondary | ICD-10-CM

## 2023-05-18 DIAGNOSIS — D179 Benign lipomatous neoplasm, unspecified: Secondary | ICD-10-CM

## 2023-05-18 DIAGNOSIS — L918 Other hypertrophic disorders of the skin: Secondary | ICD-10-CM

## 2023-05-18 DIAGNOSIS — L814 Other melanin hyperpigmentation: Secondary | ICD-10-CM

## 2023-05-18 DIAGNOSIS — D1801 Hemangioma of skin and subcutaneous tissue: Secondary | ICD-10-CM

## 2023-05-18 DIAGNOSIS — W908XXA Exposure to other nonionizing radiation, initial encounter: Secondary | ICD-10-CM

## 2023-05-18 DIAGNOSIS — D229 Melanocytic nevi, unspecified: Secondary | ICD-10-CM

## 2023-05-18 DIAGNOSIS — L570 Actinic keratosis: Secondary | ICD-10-CM | POA: Diagnosis not present

## 2023-05-18 DIAGNOSIS — Z1283 Encounter for screening for malignant neoplasm of skin: Secondary | ICD-10-CM | POA: Diagnosis not present

## 2023-05-18 DIAGNOSIS — L821 Other seborrheic keratosis: Secondary | ICD-10-CM

## 2023-05-18 DIAGNOSIS — X32XXXA Exposure to sunlight, initial encounter: Secondary | ICD-10-CM

## 2023-05-18 DIAGNOSIS — L578 Other skin changes due to chronic exposure to nonionizing radiation: Secondary | ICD-10-CM

## 2023-05-18 DIAGNOSIS — D171 Benign lipomatous neoplasm of skin and subcutaneous tissue of trunk: Secondary | ICD-10-CM

## 2023-05-18 NOTE — Progress Notes (Signed)
New Pt Visit   Subjective  Brian Atkins is a 67 y.o. male who presents for the following: Skin Cancer Screening and Upper Body Skin Exam The patient presents for Upper Body Skin Exam (UBSE) for skin cancer screening and mole check. The patient has spots, moles and lesions to be evaluated, some may be new or changing and the patient has concerns that these could be cancer.  The following portions of the chart were reviewed this encounter and updated as appropriate: medications, allergies, medical history  Review of Systems:  No other skin or systemic complaints except as noted in HPI or Assessment and Plan.  Objective  Well appearing patient in no apparent distress; mood and affect are within normal limits.  All skin waist up examined. Relevant physical exam findings are noted in the Assessment and Plan.  Left face x 3, back x 15 (18) Erythematous stuck-on, waxy papule or plaque  Scalp Erythematous thin papules/macules with gritty scale.   Right Anterior Neck (2) Fleshy, skin-colored pedunculated papules.     Assessment & Plan   Lipoma  Exam: Subcutaneous rubbery nodule 7.0 x 5.0 cm Location: Left upper back Benign-appearing. Exam most consistent with a lipoma. Discussed that a lipoma is a benign fatty growth that can grow over time and sometimes get irritated. Recommend observation if it is not bothersome or changing. Discussed option of ILK injections or surgical excision to remove it if it is growing, symptomatic, or other changes noted. Please call for new or changing lesions so they can be evaluated.  Inflamed seborrheic keratosis (18) Left face x 3, back x 15  Symptomatic, irritating, patient would like treated.  Benign-appearing.  Call clinic for new or changing lesions.   Prior to procedure, discussed risks of blister formation, small wound, skin dyspigmentation, or rare scar following treatment. Recommend Vaseline ointment to treated areas while healing.   Left  temple - Biopsy proven SK ([facility])  Destruction of lesion - Left face x 3, back x 15 Complexity: simple   Destruction method: cryotherapy   Informed consent: discussed and consent obtained   Timeout:  patient name, date of birth, surgical site, and procedure verified Lesion destroyed using liquid nitrogen: Yes   Region frozen until ice ball extended beyond lesion: Yes   Outcome: patient tolerated procedure well with no complications   Post-procedure details: wound care instructions given    AK (actinic keratosis) Scalp  Destruction of lesion - Scalp Complexity: simple   Destruction method: cryotherapy   Informed consent: discussed and consent obtained   Timeout:  patient name, date of birth, surgical site, and procedure verified Lesion destroyed using liquid nitrogen: Yes   Region frozen until ice ball extended beyond lesion: Yes   Outcome: patient tolerated procedure well with no complications   Post-procedure details: wound care instructions given    Skin tag (2) Right Anterior Neck  Symptomatic, irritating, patient would like treated.  Benign-appearing.  Call clinic for new or changing lesions.   Prior to procedure, discussed risks of blister formation, small wound, skin dyspigmentation, or rare scar following treatment. Recommend Vaseline ointment to treated areas while healing.   Destruction of lesion - Right Anterior Neck Complexity: simple   Destruction method: cryotherapy   Informed consent: discussed and consent obtained   Timeout:  patient name, date of birth, surgical site, and procedure verified Lesion destroyed using liquid nitrogen: Yes   Region frozen until ice ball extended beyond lesion: Yes   Outcome: patient tolerated procedure well  with no complications   Post-procedure details: wound care instructions given     Lentigines, Seborrheic Keratoses, Hemangiomas - Benign normal skin lesions - Benign-appearing - Call for any changes  Melanocytic Nevi -  Tan-brown and/or pink-flesh-colored symmetric macules and papules - Benign appearing on exam today - Observation - Call clinic for new or changing moles - Recommend daily use of broad spectrum spf 30+ sunscreen to sun-exposed areas.   Actinic Damage - Chronic condition, secondary to cumulative UV/sun exposure - diffuse scaly erythematous macules with underlying dyspigmentation - Recommend daily broad spectrum sunscreen SPF 30+ to sun-exposed areas, reapply every 2 hours as needed.  - Staying in the shade or wearing long sleeves, sun glasses (UVA+UVB protection) and wide brim hats (4-inch brim around the entire circumference of the hat) are also recommended for sun protection.  - Call for new or changing lesions.  Acrochordons (Skin Tags) - Fleshy, skin-colored pedunculated papules - Benign appearing.  - Observe. - If desired, they can be removed with an in office procedure that is not covered by insurance. - Please call the clinic if you notice any new or changing lesions.  Skin cancer screening performed today.  Follow-up 1 year  I, Joanie Coddington, CMA, am acting as scribe for Armida Sans, MD .  Documentation: I have reviewed the above documentation for accuracy and completeness, and I agree with the above.  Armida Sans, MD

## 2023-05-18 NOTE — Patient Instructions (Signed)
Cryotherapy Aftercare  Wash gently with soap and water everyday.   Apply Vaseline and Band-Aid daily until healed.     Due to recent changes in healthcare laws, you may see results of your pathology and/or laboratory studies on MyChart before the doctors have had a chance to review them. We understand that in some cases there may be results that are confusing or concerning to you. Please understand that not all results are received at the same time and often the doctors may need to interpret multiple results in order to provide you with the best plan of care or course of treatment. Therefore, we ask that you please give us 2 business days to thoroughly review all your results before contacting the office for clarification. Should we see a critical lab result, you will be contacted sooner.   If You Need Anything After Your Visit  If you have any questions or concerns for your doctor, please call our main line at [phone] and press option 4 to reach your doctor's medical assistant. If no one answers, please leave a voicemail as directed and we will return your call as soon as possible. Messages left after 4 pm will be answered the following business day.   You may also send us a message via MyChart. We typically respond to MyChart messages within 1-2 business days.  For prescription refills, please ask your pharmacy to contact our office. Our fax number is [phone].  If you have an urgent issue when the clinic is closed that cannot wait until the next business day, you can page your doctor at the number below.    Please note that while we do our best to be available for urgent issues outside of office hours, we are not available 24/7.   If you have an urgent issue and are unable to reach us, you may choose to seek medical care at your doctor's office, retail clinic, urgent care center, or emergency room.  If you have a medical emergency, please immediately call 911 or go to the  emergency department.  Pager Numbers  - Dr. Kowalski: [phone]  - Dr. Moye: [phone]  - Dr. Stewart: [phone]  In the event of inclement weather, please call our main line at [phone] for an update on the status of any delays or closures.  Dermatology Medication Tips: Please keep the boxes that topical medications come in in order to help keep track of the instructions about where and how to use these. Pharmacies typically print the medication instructions only on the boxes and not directly on the medication tubes.   If your medication is too expensive, please contact our office at [phone] option 4 or send us a message through MyChart.   We are unable to tell what your co-pay for medications will be in advance as this is different depending on your insurance coverage. However, we may be able to find a substitute medication at lower cost or fill out paperwork to get insurance to cover a needed medication.   If a prior authorization is required to get your medication covered by your insurance company, please allow us 1-2 business days to complete this process.  Drug prices often vary depending on where the prescription is filled and some pharmacies may offer cheaper prices.  The website www.goodrx.com contains coupons for medications through different pharmacies. The prices here do not account for what the cost may be with help from insurance (it may be cheaper with your insurance), but the website can   give you the price if you did not use any insurance.  - You can print the associated coupon and take it with your prescription to the pharmacy.  - You may also stop by our office during regular business hours and pick up a GoodRx coupon card.  - If you need your prescription sent electronically to a different pharmacy, notify our office through [facility] MyChart or by phone at [phone] option 4.     Si Usted Necesita Algo Despus de Su Visita  Tambin puede  enviarnos un mensaje a travs de MyChart. Por lo general respondemos a los mensajes de MyChart en el transcurso de 1 a 2 das hbiles.  Para renovar recetas, por favor pida a su farmacia que se ponga en contacto con nuestra oficina. Nuestro nmero de fax es el [phone].  Si tiene un asunto urgente cuando la clnica est cerrada y que no puede esperar hasta el siguiente da hbil, puede llamar/localizar a su doctor(a) al nmero que aparece a continuacin.   Por favor, tenga en cuenta que aunque hacemos todo lo posible para estar disponibles para asuntos urgentes fuera del horario de oficina, no estamos disponibles las 24 horas del da, los 7 das de la semana.   Si tiene un problema urgente y no puede comunicarse con nosotros, puede optar por buscar atencin mdica  en el consultorio de su doctor(a), en una clnica privada, en un centro de atencin urgente o en una sala de emergencias.  Si tiene una emergencia mdica, por favor llame inmediatamente al 911 o vaya a la sala de emergencias.  Nmeros de bper  - Dr. Kowalski: [phone]  - Dra. Moye: [phone]  - Dra. Stewart: [phone]  En caso de inclemencias del tiempo, por favor llame a nuestra lnea principal al [phone] para una actualizacin sobre el estado de cualquier retraso o cierre.  Consejos para la medicacin en dermatologa: Por favor, guarde las cajas en las que vienen los medicamentos de uso tpico para ayudarle a seguir las instrucciones sobre dnde y cmo usarlos. Las farmacias generalmente imprimen las instrucciones del medicamento slo en las cajas y no directamente en los tubos del medicamento.   Si su medicamento es muy caro, por favor, pngase en contacto con nuestra oficina llamando al [phone] y presione la opcin 4 o envenos un mensaje a travs de MyChart.   No podemos decirle cul ser su copago por los medicamentos por adelantado ya que esto es diferente dependiendo de la cobertura de su seguro.  Sin embargo, es posible que podamos encontrar un medicamento sustituto a menor costo o llenar un formulario para que el seguro cubra el medicamento que se considera necesario.   Si se requiere una autorizacin previa para que su compaa de seguros cubra su medicamento, por favor permtanos de 1 a 2 das hbiles para completar este proceso.  Los precios de los medicamentos varan con frecuencia dependiendo del lugar de dnde se surte la receta y alguna farmacias pueden ofrecer precios ms baratos.  El sitio web www.goodrx.com tiene cupones para medicamentos de diferentes farmacias. Los precios aqu no tienen en cuenta lo que podra costar con la ayuda del seguro (puede ser ms barato con su seguro), pero el sitio web puede darle el precio si no utiliz ningn seguro.  - Puede imprimir el cupn correspondiente y llevarlo con su receta a la farmacia.  - Tambin puede pasar por nuestra oficina durante el horario de atencin regular y recoger una tarjeta de cupones de GoodRx.  -   Si necesita que su receta se enve electrnicamente a una farmacia diferente, informe a nuestra oficina a travs de MyChart de [place] o por telfono llamando al [phone] y presione la opcin 4.  

## 2023-05-25 ENCOUNTER — Encounter: Payer: Self-pay | Admitting: Dermatology

## 2023-06-06 ENCOUNTER — Encounter: Payer: Self-pay | Admitting: Internal Medicine

## 2023-06-14 ENCOUNTER — Other Ambulatory Visit: Payer: Self-pay | Admitting: Internal Medicine

## 2023-06-14 NOTE — Telephone Encounter (Signed)
Refilled: 11/09/2022 Last OV: 01/24/2023 Next OV: not scheduled

## 2023-06-21 ENCOUNTER — Ambulatory Visit (INDEPENDENT_AMBULATORY_CARE_PROVIDER_SITE_OTHER): Payer: Medicare Other | Admitting: Internal Medicine

## 2023-06-21 ENCOUNTER — Telehealth: Payer: Medicare Other | Admitting: Family Medicine

## 2023-06-21 ENCOUNTER — Telehealth: Payer: Self-pay | Admitting: Family Medicine

## 2023-06-21 ENCOUNTER — Encounter: Payer: Self-pay | Admitting: Family Medicine

## 2023-06-21 VITALS — Temp 99.2°F | Ht 74.0 in | Wt 215.0 lb

## 2023-06-21 DIAGNOSIS — R52 Pain, unspecified: Secondary | ICD-10-CM | POA: Diagnosis not present

## 2023-06-21 DIAGNOSIS — R197 Diarrhea, unspecified: Secondary | ICD-10-CM

## 2023-06-21 NOTE — Telephone Encounter (Signed)
Called pt and he has already been to lab.

## 2023-06-21 NOTE — Progress Notes (Signed)
Virtual Visit via Video note  I connected with Brian Atkins on 07/17/23 at 1200 by video and verified that I am speaking with the correct person using two identifiers. Brian Atkins is currently located at home and is currently alone during visit. The provider, Dana Allan, MD is located in their office at time of visit.  I discussed the limitations, risks, security and privacy concerns of performing an evaluation and management service by video and the availability of in person appointments. I also discussed with the patient that there may be a patient responsible charge related to this service. The patient expressed understanding and agreed to proceed.  Subjective: PCP: Sherlene Shams, MD  Chief Complaint  Patient presents with   Fever    Low grade fever   Generalized Body Aches    Whole body ache when he lays down hurts to the bone. X 1 day   Diarrhea    HPI Patient endorses having general body aches that started few days ago.  Worsened last night and could not sleep.  Had diarrhea x 1.  Endorses temperature of 99.3.  Went to work today and came home due to not feeling well.  Took Tylenol around 4 PM.  Denies any vomiting, nausea, abdominal pain, cough, chest pain, shortness of breath, decrease in appetite, bloody stool, dysuria, hematuria, hematochezia.  Reports possible sick contact at work with similar symptoms but cannot be sure. Able to hydrate well.   ROS: Per HPI  Current Outpatient Medications:    albuterol (VENTOLIN HFA) 108 (90 Base) MCG/ACT inhaler, Inhale 2 puffs into the lungs every 6 (six) hours as needed for wheezing or shortness of breath., Disp: 8 g, Rfl: 0   Alirocumab (PRALUENT) 150 MG/ML SOAJ, Inject 1 mL (150 mg total) into the skin every 14 (fourteen) days., Disp: 2 mL, Rfl: 2   carvedilol (COREG) 12.5 MG tablet, Take 1.5 tablets (18.75 mg total) by mouth 2 (two) times daily with a meal., Disp: 270 tablet, Rfl: 3   cyanocobalamin (VITAMIN B12) 1000 MCG/ML  injection, INJECT INTO THE MUSCLE EVERY 30 DAYS, Disp: 10 mL, Rfl: 0   DULoxetine (CYMBALTA) 20 MG capsule, Take 20 mg by mouth as needed., Disp: , Rfl:    Eszopiclone 3 MG TABS, TAKE ONE TABLET BY MOUTH AT BEDTIME ALTERNATING WITH AMBIEN, Disp: 15 tablet, Rfl: 5   ezetimibe (ZETIA) 10 MG tablet, Take 1 tablet by mouth every morning., Disp: , Rfl:    famotidine (PEPCID) 20 MG tablet, Take 20 mg by mouth 2 (two) times daily., Disp: , Rfl:    furosemide (LASIX) 40 MG tablet, Take 40 mg by mouth as needed., Disp: , Rfl:    hydrochlorothiazide (MICROZIDE) 12.5 MG capsule, TAKE ONE CAPSULE BY MOUTH ONE TIME DAILY, Disp: 90 capsule, Rfl: 0   losartan (COZAAR) 100 MG tablet, TAKE ONE TABLET BY MOUTH ONE TIME DAILY (Patient taking differently: Take 100 mg by mouth at bedtime.), Disp: 90 tablet, Rfl: 2   Multiple Vitamins-Minerals (MULTIVITAMIN WITH MINERALS) tablet, Take 1 tablet by mouth daily., Disp: , Rfl:    nitroGLYCERIN (NITROSTAT) 0.4 MG SL tablet, Place 1 tablet (0.4 mg total) under the tongue every 5 (five) minutes as needed for chest pain. Maximum of 3 doses., Disp: 35 tablet, Rfl: 3   omeprazole (PRILOSEC) 40 MG capsule, Take 40 mg by mouth every morning., Disp: , Rfl:    ondansetron (ZOFRAN) 4 MG tablet, Take 1 tablet (4 mg total) by mouth every  8 (eight) hours as needed., Disp: 20 tablet, Rfl: 5   ondansetron (ZOFRAN) 4 MG tablet, Take 1 tablet (4 mg total) by mouth every 8 (eight) hours as needed for nausea or vomiting., Disp: 20 tablet, Rfl: 0   ONETOUCH VERIO test strip, USE TO CHECK BLOOD SUGAR AS NEEDED, Disp: 100 strip, Rfl: 2   oxyCODONE (OXY IR/ROXICODONE) 5 MG immediate release tablet, Take 1 tablet (5 mg total) by mouth every 4 (four) hours as needed for severe pain., Disp: 40 tablet, Rfl: 0   Prucalopride Succinate (MOTEGRITY) 2 MG TABS, Take 2 mg by mouth daily as needed (constipation)., Disp: , Rfl:    rOPINIRole (REQUIP) 0.25 MG tablet, Take 0.25 mg by mouth in the morning and  at bedtime. , Disp: , Rfl:    Syringe/Needle, Disp, (SYRINGE 3CC/25GX1") 25G X 1" 3 ML MISC, Use for b12 injections, Disp: 50 each, Rfl: 0   tiZANidine (ZANAFLEX) 4 MG tablet, TAKE 1/2 TO 1 TABLET BY MOUTH AT BEDTIME AS NEEDED FOR MUSCLE SPASM, Disp: 30 tablet, Rfl: 2   triamcinolone (NASACORT) 55 MCG/ACT AERO nasal inhaler, Place 2 sprays into the nose every 8 (eight) hours as needed., Disp: , Rfl:    zolpidem (AMBIEN CR) 12.5 MG CR tablet, Take 1 tablet (12.5 mg total) by mouth at bedtime as needed. for sleep, Disp: 30 tablet, Rfl: 5   ELIQUIS 5 MG TABS tablet, TAKE ONE TABLET BY MOUTH TWICE A DAY, Disp: 60 tablet, Rfl: 5  Observations/Objective: Physical Exam Vitals reviewed.  Constitutional:      General: He is not in acute distress.    Appearance: Normal appearance. He is not toxic-appearing.  Eyes:     Conjunctiva/sclera: Conjunctivae normal.  Pulmonary:     Effort: Pulmonary effort is normal.  Neurological:     Mental Status: He is alert. Mental status is at baseline.  Psychiatric:        Mood and Affect: Mood normal.        Behavior: Behavior normal.        Thought Content: Thought content normal.        Judgment: Judgment normal.    Assessment and Plan: w87e Diarrhea, unspecified type Assessment & Plan: Recent sick contact.  Likely viral etiology. Limited exam given nature of visit.  In no acute distress. History of SBO Check labs and stool  Continue to remain hydrated Strict return precautions provided Follow up with PCP  Orders: -     Basic metabolic panel; Future -     C-reactive protein; Future -     CBC with Differential/Platelet; Future -     Cdiff NAA+O+P+Stool Culture; Future -     Sedimentation rate; Future  Generalized body aches -     Basic metabolic panel; Future -     C-reactive protein; Future -     CBC with Differential/Platelet; Future -     Sedimentation rate; Future    Follow Up Instructions: Return if symptoms worsen or fail to improve,  for PCP.   I discussed the assessment and treatment plan with the patient. The patient was provided an opportunity to ask questions and all were answered. The patient agreed with the plan and demonstrated an understanding of the instructions.   The patient was advised to call back or seek an in-person evaluation if the symptoms worsen or if the condition fails to improve as anticipated.  The above assessment and management plan was discussed with the patient. The patient verbalized understanding of  and has agreed to the management plan. Patient is aware to call the clinic if symptoms persist or worsen. Patient is aware when to return to the clinic for a follow-up visit. Patient educated on when it is appropriate to go to the emergency department.     Dana Allan, MD

## 2023-06-22 ENCOUNTER — Encounter: Payer: Self-pay | Admitting: Family Medicine

## 2023-06-22 ENCOUNTER — Other Ambulatory Visit
Admission: RE | Admit: 2023-06-22 | Discharge: 2023-06-22 | Disposition: A | Discharge status: Home / Self Care | Payer: Medicare Other | Source: Ambulatory Visit | Attending: Family Medicine | Admitting: Family Medicine

## 2023-06-22 DIAGNOSIS — R197 Diarrhea, unspecified: Secondary | ICD-10-CM | POA: Insufficient documentation

## 2023-06-22 LAB — C DIFFICILE QUICK SCREEN W PCR REFLEX
C Diff antigen: NEGATIVE
C Diff interpretation: NOT DETECTED
C Diff toxin: NEGATIVE

## 2023-06-22 LAB — CBC WITH DIFFERENTIAL/PLATELET
Basophils Absolute: 0.1 10*3/uL (ref 0.0–0.1)
Basophils Relative: 1.2 % (ref 0.0–3.0)
Eosinophils Absolute: 0.3 10*3/uL (ref 0.0–0.7)
Eosinophils Relative: 3.4 % (ref 0.0–5.0)
HCT: 45 % (ref 39.0–52.0)
Hemoglobin: 14.9 g/dL (ref 13.0–17.0)
Lymphocytes Relative: 33.9 % (ref 12.0–46.0)
Lymphs Abs: 3.5 10*3/uL (ref 0.7–4.0)
MCHC: 33.1 g/dL (ref 30.0–36.0)
MCV: 92.4 fl (ref 78.0–100.0)
Monocytes Absolute: 1.3 10*3/uL — ABNORMAL HIGH (ref 0.1–1.0)
Monocytes Relative: 13.2 % — ABNORMAL HIGH (ref 3.0–12.0)
Neutro Abs: 4.9 10*3/uL (ref 1.4–7.7)
Neutrophils Relative %: 48.3 % (ref 43.0–77.0)
Platelets: 363 10*3/uL (ref 150.0–400.0)
RBC: 4.86 Mil/uL (ref 4.22–5.81)
RDW: 14.4 % (ref 11.5–15.5)
WBC: 10.2 10*3/uL (ref 4.0–10.5)

## 2023-06-22 LAB — BASIC METABOLIC PANEL
BUN: 15 mg/dL (ref 6–23)
CO2: 30 mEq/L (ref 19–32)
Calcium: 10 mg/dL (ref 8.4–10.5)
Chloride: 102 mEq/L (ref 96–112)
Creatinine, Ser: 1.01 mg/dL (ref 0.40–1.50)
GFR: 77.33 mL/min (ref >60.00)
Glucose, Bld: 76 mg/dL (ref 70–99)
Potassium: 4 mEq/L (ref 3.5–5.1)
Sodium: 139 mEq/L (ref 135–145)

## 2023-06-22 LAB — SEDIMENTATION RATE: Sed Rate: 13 mm/hr (ref 0–20)

## 2023-06-22 LAB — C-REACTIVE PROTEIN: CRP: <1 mg/dL (ref 0.5–20.0)

## 2023-06-24 LAB — OVA + PARASITE EXAM

## 2023-06-24 LAB — O&P RESULT

## 2023-06-26 LAB — STOOL CULTURE: E coli, Shiga toxin Assay: NEGATIVE

## 2023-06-26 LAB — STOOL CULTURE REFLEX - RSASHR

## 2023-06-26 LAB — STOOL CULTURE REFLEX - CMPCXR

## 2023-07-01 ENCOUNTER — Encounter: Payer: Self-pay | Admitting: Family Medicine

## 2023-07-04 NOTE — Progress Notes (Signed)
Patient was scheduled for office visit but due to diarrhea was unable to travel.

## 2023-07-12 ENCOUNTER — Other Ambulatory Visit: Payer: Self-pay | Admitting: Internal Medicine

## 2023-07-17 ENCOUNTER — Encounter: Payer: Self-pay | Admitting: Internal Medicine

## 2023-07-17 ENCOUNTER — Encounter: Payer: Self-pay | Admitting: Family Medicine

## 2023-07-17 DIAGNOSIS — R197 Diarrhea, unspecified: Secondary | ICD-10-CM | POA: Insufficient documentation

## 2023-07-17 DIAGNOSIS — R52 Pain, unspecified: Secondary | ICD-10-CM | POA: Insufficient documentation

## 2023-07-17 NOTE — Patient Instructions (Signed)
It was a pleasure meeting you today. Thank you for allowing me to take part in your health care.  Our goals for today as we discussed include:  Continue to stay hydrated  Will get some labs and stool samples   Follow up with PCP if no improvement   If you have any questions or concerns, please do not hesitate to call the office at [phone].  I look forward to our next visit and until then take care and stay safe.  Regards,   Dana Allan, MD   [facility]

## 2023-07-17 NOTE — Assessment & Plan Note (Signed)
Recent sick contact.  Likely viral etiology. Limited exam given nature of visit.  In no acute distress. History of SBO Check labs and stool  Continue to remain hydrated Strict return precautions provided Follow up with PCP

## 2023-08-06 ENCOUNTER — Other Ambulatory Visit: Payer: Self-pay | Admitting: Internal Medicine

## 2023-08-06 DIAGNOSIS — M25512 Pain in left shoulder: Secondary | ICD-10-CM

## 2023-08-06 DIAGNOSIS — M5030 Other cervical disc degeneration, unspecified cervical region: Secondary | ICD-10-CM

## 2023-08-06 DIAGNOSIS — M542 Cervicalgia: Secondary | ICD-10-CM

## 2023-08-06 DIAGNOSIS — R519 Headache, unspecified: Secondary | ICD-10-CM

## 2023-08-06 DIAGNOSIS — M19012 Primary osteoarthritis, left shoulder: Secondary | ICD-10-CM

## 2023-09-12 ENCOUNTER — Telehealth: Payer: Self-pay | Admitting: Internal Medicine

## 2023-09-12 NOTE — Telephone Encounter (Signed)
Placed in red folder

## 2023-09-12 NOTE — Telephone Encounter (Signed)
Patient dropped off document Surgical Clearance, to be filled out by provider. Patient requested to send it via Fax within 5-days. Document is located in providers folder at front office.Please advise at Mobile [phone] (mobile).

## 2023-09-13 NOTE — Telephone Encounter (Signed)
Form has been faxed back and pt is also aware.

## 2023-09-27 ENCOUNTER — Other Ambulatory Visit: Payer: Self-pay | Admitting: Internal Medicine

## 2023-09-27 NOTE — Telephone Encounter (Signed)
Refilled: 03/16/2023 Last OV: 06/21/2023 Next OV: not scheduled

## 2023-09-28 ENCOUNTER — Encounter: Payer: Self-pay | Admitting: Dermatology

## 2023-09-28 ENCOUNTER — Ambulatory Visit: Payer: Medicare Other | Admitting: Dermatology

## 2023-09-28 VITALS — BP 144/63 | HR 65

## 2023-09-28 DIAGNOSIS — L820 Inflamed seborrheic keratosis: Secondary | ICD-10-CM

## 2023-09-28 DIAGNOSIS — L821 Other seborrheic keratosis: Secondary | ICD-10-CM | POA: Diagnosis not present

## 2023-09-28 DIAGNOSIS — R21 Rash and other nonspecific skin eruption: Secondary | ICD-10-CM | POA: Diagnosis not present

## 2023-09-28 DIAGNOSIS — L918 Other hypertrophic disorders of the skin: Secondary | ICD-10-CM | POA: Diagnosis not present

## 2023-09-28 MED ORDER — CLINDAMYCIN PHOSPHATE 1 % EX LOTN
TOPICAL_LOTION | CUTANEOUS | 0 refills | Status: DC
Start: 2023-09-28 — End: 2024-03-06

## 2023-09-28 NOTE — Progress Notes (Signed)
Follow-Up Visit   Subjective  Brian Atkins is a 67 y.o. male who presents for the following: skin tag under right eye, under left arm, left side of neck, right forearm and elbow, left groin area, both ankles.   The patient has spots, moles and lesions to be evaluated, some may be new or changing and the patient may have concern these could be cancer.   The following portions of the chart were reviewed this encounter and updated as appropriate: medications, allergies, medical history  Review of Systems:  No other skin or systemic complaints except as noted in HPI or Assessment and Plan.  Objective  Well appearing patient in no apparent distress; mood and affect are within normal limits.   A focused examination was performed of the following areas: Face, neck, ankles , right arm, left axilla , left inguinal area, b/l heels    Relevant exam findings are noted in the Assessment and Plan.  right forearm x 1 (4) Erythematous stuck-on, waxy papule or plaque  Right Upper Eyelid x 1, left neck x 1, left axilla x 1, right AC fossa     Assessment & Plan   SEBORRHEIC KERATOSIS At b/l medial heels  - Stuck-on, waxy, tan-brown papules and/or plaques  - Benign-appearing - Discussed benign etiology and prognosis. - Observe - Call for any changes  Rash at left inguinal fold concerning for erythrasma  Exam: erythematous circular patch on left hemiscrotum extending to inguinal crease and medial thigh. Minimal scaling. KOH negative for fungi. Red fluorescence with UV light  Treatment Plan: For possible erythrasma: start Clindamycin lotion to affected area twice daily for 2 weeks. If not improved after 2 weeks send mychart and will send Elidel for BID use x 2 weeks. Then patient has follow up in 4 weeks if not clear and will get biopsy   Rash and other nonspecific skin eruption  Related Medications clindamycin (CLEOCIN-T) 1 % lotion Apply twice daily to affected rash for 2  weeks.  Inflamed seborrheic keratosis (4) right forearm x 1  Symptomatic, irritating, patient would like treated.  Destruction of lesion - right forearm x 1 (4) Complexity: simple   Destruction method: cryotherapy   Informed consent: discussed and consent obtained   Timeout:  patient name, date of birth, surgical site, and procedure verified Lesion destroyed using liquid nitrogen: Yes   Region frozen until ice ball extended beyond lesion: Yes   Cryo cycles: 1 or 2. Outcome: patient tolerated procedure well with no complications   Post-procedure details: wound care instructions given   Additional details:  Prior to procedure, discussed risks of blister formation, small wound, skin dyspigmentation, or rare scar following cryotherapy. Recommend Vaseline ointment to treated areas while healing.   Skin tag Right Upper Eyelid x 1, left neck x 1, left axilla x 1, right AC fossa  DO NOT BILL INSURANCE  Destruction of lesion - Right Upper Eyelid x 1, left neck x 1, left axilla x 1, right AC fossa Complexity: simple   Destruction method: cryotherapy   Informed consent: discussed and consent obtained   Timeout:  patient name, date of birth, surgical site, and procedure verified Lesion destroyed using liquid nitrogen: Yes   Region frozen until ice ball extended beyond lesion: Yes   Cryo cycles: 1 or 2. Outcome: patient tolerated procedure well with no complications   Post-procedure details: wound care instructions given      Return in about 1 month (around 10/29/2023) for rash follow up.  I, Asher Muir, CMA, am acting as scribe for Elie Goody, MD.   Documentation: I have reviewed the above documentation for accuracy and completeness, and I agree with the above.  Elie Goody, MD

## 2023-09-28 NOTE — Patient Instructions (Addendum)
Apply clindamycin lotion to rash twice daily for 2 weeks if not improved send mychart message.    Seborrheic Keratosis  What causes seborrheic keratoses? Seborrheic keratoses are harmless, common skin growths that first appear during adult life.  As time goes by, more growths appear.  Some people may develop a large number of them.  Seborrheic keratoses appear on both covered and uncovered body parts.  They are not caused by sunlight.  The tendency to develop seborrheic keratoses can be inherited.  They vary in color from skin-colored to gray, brown, or even black.  They can be either smooth or have a rough, warty surface.   Seborrheic keratoses are superficial and look as if they were stuck on the skin.  Under the microscope this type of keratosis looks like layers upon layers of skin.  That is why at times the top layer may seem to fall off, but the rest of the growth remains and re-grows.    Treatment Seborrheic keratoses do not need to be treated, but can easily be removed in the office.  Seborrheic keratoses often cause symptoms when they rub on clothing or jewelry.  Lesions can be in the way of shaving.  If they become inflamed, they can cause itching, soreness, or burning.  Removal of a seborrheic keratosis can be accomplished by freezing, burning, or surgery. If any spot bleeds, scabs, or grows rapidly, please return to have it checked, as these can be an indication of a skin cancer.   Cryotherapy Aftercare  Wash gently with soap and water everyday.   Apply Vaseline and Band-Aid daily until healed.        Due to recent changes in healthcare laws, you may see results of your pathology and/or laboratory studies on MyChart before the doctors have had a chance to review them. We understand that in some cases there may be results that are confusing or concerning to you. Please understand that not all results are received at the same time and often the doctors may need to interpret  multiple results in order to provide you with the best plan of care or course of treatment. Therefore, we ask that you please give [place] 2 business days to thoroughly review all your results before contacting the office for clarification. Should we see a critical lab result, you will be contacted sooner.   If You Need Anything After Your Visit  If you have any questions or concerns for your doctor, please call our main line at [phone] and press option 4 to reach your doctor's medical assistant. If no one answers, please leave a voicemail as directed and we will return your call as soon as possible. Messages left after 4 pm will be answered the following business day.   You may also send [place] a message via MyChart. We typically respond to MyChart messages within 1-2 business days.  For prescription refills, please ask your pharmacy to contact our office. Our fax number is [phone].  If you have an urgent issue when the clinic is closed that cannot wait until the next business day, you can page your doctor at the number below.    Please note that while we do our best to be available for urgent issues outside of office hours, we are not available 24/7.   If you have an urgent issue and are unable to reach [place], you may choose to seek medical care at your doctor's office, retail clinic, urgent care center, or emergency room.  If  you have a medical emergency, please immediately call 911 or go to the emergency department.  Pager Numbers  - Dr. Gwen Pounds: [phone]  - Dr. Roseanne Reno: [phone]  - Dr. Katrinka Blazing: [phone]   In the event of inclement weather, please call our main line at [phone] for an update on the status of any delays or closures.  Dermatology Medication Tips: Please keep the boxes that topical medications come in in order to help keep track of the instructions about where and how to use these. Pharmacies typically print the medication instructions only on the boxes  and not directly on the medication tubes.   If your medication is too expensive, please contact our office at [phone] option 4 or send [place] a message through MyChart.   We are unable to tell what your co-pay for medications will be in advance as this is different depending on your insurance coverage. However, we may be able to find a substitute medication at lower cost or fill out paperwork to get insurance to cover a needed medication.   If a prior authorization is required to get your medication covered by your insurance company, please allow [place] 1-2 business days to complete this process.  Drug prices often vary depending on where the prescription is filled and some pharmacies may offer cheaper prices.  The website www.goodrx.com contains coupons for medications through different pharmacies. The prices here do not account for what the cost may be with help from insurance (it may be cheaper with your insurance), but the website can give you the price if you did not use any insurance.  - You can print the associated coupon and take it with your prescription to the pharmacy.  - You may also stop by our office during regular business hours and pick up a GoodRx coupon card.  - If you need your prescription sent electronically to a different pharmacy, notify our office through [facility] or by phone at [phone] option 4.     Si Usted Necesita Algo Despus de Su Visita  Tambin puede enviarnos un mensaje a travs de Clinical cytogeneticist. Por lo general respondemos a los mensajes de MyChart en el transcurso de 1 a 2 das hbiles.  Para renovar recetas, por favor pida a su farmacia que se ponga en contacto con nuestra oficina. Annie Sable de fax es [place] [phone].  Si tiene un asunto urgente cuando la clnica est cerrada y que no puede esperar hasta el siguiente da hbil, puede llamar/localizar a su doctor(a) al nmero que aparece a continuacin.   Por favor, tenga en cuenta que aunque  hacemos todo lo posible para estar disponibles para asuntos urgentes fuera del horario de [place], no estamos disponibles las 24 horas del da, los 7 [address]  [address] de la [place].   Si tiene un problema urgente y no puede comunicarse con nosotros, puede optar por buscar atencin mdica  en el consultorio de su doctor(a), en una clnica privada, en un centro de atencin urgente o en una sala de emergencias.  Si tiene Engineer, drilling, por favor llame inmediatamente al 911 o vaya a la sala de emergencias.  Nmeros de bper  - Dr. Gwen Pounds: [phone]  - Dra. Roseanne Reno: [phone]  - Dr. Katrinka Blazing: [phone]   En caso de inclemencias del tiempo, por favor llame a Lacy Duverney principal al [phone] para una actualizacin sobre el [place] de cualquier retraso o cierre.  Consejos para la medicacin en dermatologa: Por favor, guarde las cajas en las que vienen los medicamentos  de uso tpico para ayudarle a seguir las [employer] dnde y cmo usarlos. Las farmacias generalmente imprimen las instrucciones del medicamento slo en las cajas y no directamente en los tubos del [place].   Si su medicamento es muy caro, por favor, pngase en contacto con Rolm Gala llamando al [phone] y presione la opcin 4 o envenos un mensaje a travs de Clinical cytogeneticist.   No podemos decirle cul ser su copago por los medicamentos por adelantado ya que esto es diferente dependiendo de la cobertura de su seguro. Sin embargo, es posible que podamos encontrar un medicamento sustituto a Audiological scientist un formulario para que el seguro cubra el medicamento que se considera necesario.   Si se requiere una autorizacin previa para que su compaa de seguros [place] su medicamento, por favor permtanos de 1 a 2 das hbiles para completar [address]  Los precios de los medicamentos varan con frecuencia dependiendo del Environmental consultant de dnde se surte la receta y alguna farmacias pueden ofrecer precios ms  baratos.  El sitio web www.goodrx.com tiene cupones para medicamentos de Health and safety inspector. Los precios aqu no tienen en cuenta lo que podra costar con la ayuda del seguro (puede ser ms barato con su seguro), pero el sitio web puede darle el precio si no utiliz Tourist information centre manager.  - Puede imprimir el cupn correspondiente y llevarlo con su receta a la farmacia.  - Tambin puede pasar por nuestra oficina durante el horario de atencin regular y Education officer, museum una tarjeta de cupones de GoodRx.  - Si necesita que su receta se enve electrnicamente a una farmacia diferente, informe a nuestra oficina a travs de MyChart de [place] o por telfono llamando al [phone] y presione la opcin 4.

## 2023-10-02 ENCOUNTER — Other Ambulatory Visit: Payer: Self-pay | Admitting: Internal Medicine

## 2023-10-27 ENCOUNTER — Encounter: Payer: Self-pay | Admitting: Dermatology

## 2023-10-27 ENCOUNTER — Ambulatory Visit: Payer: Medicare Other | Admitting: Dermatology

## 2023-10-27 DIAGNOSIS — L304 Erythema intertrigo: Secondary | ICD-10-CM | POA: Insufficient documentation

## 2023-10-27 DIAGNOSIS — L081 Erythrasma: Secondary | ICD-10-CM | POA: Diagnosis not present

## 2023-10-27 NOTE — Patient Instructions (Addendum)
Continue Clindamycin lotion apply to affected area once daily Start opzelura cream apply topically to affected area once daily until clear    Due to recent changes in healthcare laws, you may see results of your pathology and/or laboratory studies on MyChart before the doctors have had a chance to review them. We understand that in some cases there may be results that are confusing or concerning to you. Please understand that not all results are received at the same time and often the doctors may need to interpret multiple results in order to provide you with the best plan of care or course of treatment. Therefore, we ask that you please give [place] 2 business days to thoroughly review all your results before contacting the office for clarification. Should we see a critical lab result, you will be contacted sooner.   If You Need Anything After Your Visit  If you have any questions or concerns for your doctor, please call our main line at [phone] and press option 4 to reach your doctor's medical assistant. If no one answers, please leave a voicemail as directed and we will return your call as soon as possible. Messages left after 4 pm will be answered the following business day.   You may also send [place] a message via MyChart. We typically respond to MyChart messages within 1-2 business days.  For prescription refills, please ask your pharmacy to contact our office. Our fax number is [phone].  If you have an urgent issue when the clinic is closed that cannot wait until the next business day, you can page your doctor at the number below.    Please note that while we do our best to be available for urgent issues outside of office hours, we are not available 24/7.   If you have an urgent issue and are unable to reach [place], you may choose to seek medical care at your doctor's office, retail clinic, urgent care center, or emergency room.  If you have a medical emergency, please immediately call 911  or go to the emergency department.  Pager Numbers  - Dr. Gwen Pounds: [phone]  - Dr. Roseanne Reno: [phone]  - Dr. Katrinka Blazing: [phone]   In the event of inclement weather, please call our main line at [phone] for an update on the status of any delays or closures.  Dermatology Medication Tips: Please keep the boxes that topical medications come in in order to help keep track of the instructions about where and how to use these. Pharmacies typically print the medication instructions only on the boxes and not directly on the medication tubes.   If your medication is too expensive, please contact our office at [phone] option 4 or send [place] a message through MyChart.   We are unable to tell what your co-pay for medications will be in advance as this is different depending on your insurance coverage. However, we may be able to find a substitute medication at lower cost or fill out paperwork to get insurance to cover a needed medication.   If a prior authorization is required to get your medication covered by your insurance company, please allow [place] 1-2 business days to complete this process.  Drug prices often vary depending on where the prescription is filled and some pharmacies may offer cheaper prices.  The website www.goodrx.com contains coupons for medications through different pharmacies. The prices here do not account for what the cost may be with help from insurance (it may be cheaper with your insurance), but the  website can give you the price if you did not use any insurance.  - You can print the associated coupon and take it with your prescription to the pharmacy.  - You may also stop by our office during regular business hours and pick up a GoodRx coupon card.  - If you need your prescription sent electronically to a different pharmacy, notify our office through [facility] or by phone at [phone] option 4.     Si Usted Necesita Algo Despus de Su  Visita  Tambin puede enviarnos un mensaje a travs de Clinical cytogeneticist. Por lo general respondemos a los mensajes de MyChart en el transcurso de 1 a 2 das hbiles.  Para renovar recetas, por favor pida a su farmacia que se ponga en contacto con nuestra oficina. Annie Sable de fax es [place] [phone].  Si tiene un asunto urgente cuando la clnica est cerrada y que no puede esperar hasta el siguiente da hbil, puede llamar/localizar a su doctor(a) al nmero que aparece a continuacin.   Por favor, tenga en cuenta que aunque hacemos todo lo posible para estar disponibles para asuntos urgentes fuera del horario de [place], no estamos disponibles las 24 horas del da, los 7 [address]  [address] de la [place].   Si tiene un problema urgente y no puede comunicarse con nosotros, puede optar por buscar atencin mdica  en el consultorio de su doctor(a), en una clnica privada, en un centro de atencin urgente o en una sala de emergencias.  Si tiene Engineer, drilling, por favor llame inmediatamente al 911 o vaya a la sala de emergencias.  Nmeros de bper  - Dr. Gwen Pounds: [phone]  - Dra. Roseanne Reno: [phone]  - Dr. Katrinka Blazing: [phone]   En caso de inclemencias del tiempo, por favor llame a Lacy Duverney principal al [phone] para una actualizacin sobre el [place] de cualquier retraso o cierre.  Consejos para la medicacin en dermatologa: Por favor, guarde las cajas en las que vienen los medicamentos de uso tpico para ayudarle a seguir las instrucciones sobre dnde y cmo usarlos. Las farmacias generalmente imprimen las instrucciones del medicamento slo en las cajas y no directamente en los tubos del [place].   Si su medicamento es muy caro, por favor, pngase en contacto con Rolm Gala llamando al [phone] y presione la opcin 4 o envenos un mensaje a travs de Clinical cytogeneticist.   No podemos decirle cul ser su copago por los medicamentos por adelantado ya que esto es diferente dependiendo de  la cobertura de su seguro. Sin embargo, es posible que podamos encontrar un medicamento sustituto a Audiological scientist un formulario para que el seguro cubra el medicamento que se considera necesario.   Si se requiere una autorizacin previa para que su compaa de seguros [place] su medicamento, por favor permtanos de 1 a 2 das hbiles para completar [address]  Los precios de los medicamentos varan con frecuencia dependiendo del Environmental consultant de dnde se surte la receta y alguna farmacias pueden ofrecer precios ms baratos.  El sitio web www.goodrx.com tiene cupones para medicamentos de Health and safety inspector. Los precios aqu no tienen en cuenta lo que podra costar con la ayuda del seguro (puede ser ms barato con su seguro), pero el sitio web puede darle el precio si no utiliz Tourist information centre manager.  - Puede imprimir el cupn correspondiente y llevarlo con su receta a la farmacia.  - Tambin puede pasar por nuestra oficina durante el horario de atencin regular y Education officer, museum una tarjeta de cupones de  GoodRx.  - Si necesita que su receta se enve electrnicamente a una farmacia diferente, informe a nuestra oficina a travs de MyChart de [place] o por telfono llamando al [phone] y presione la opcin 4.

## 2023-10-27 NOTE — Progress Notes (Signed)
   Follow-Up Visit   Subjective  Brian Atkins is a 67 y.o. male who presents for the following: 1 month follow up on rash at inguinals folds, patient reports used clindamycin lotion for 3 weeks and but feels that did not help.   The patient has spots, moles and lesions to be evaluated, some may be new or changing and the patient may have concern these could be cancer.   The following portions of the chart were reviewed this encounter and updated as appropriate: medications, allergies, medical history  Review of Systems:  No other skin or systemic complaints except as noted in HPI or Assessment and Plan.  Objective  Well appearing patient in no apparent distress; mood and affect are within normal limits.   A focused examination was performed of the following areas: Inguinal folds   Relevant exam findings are noted in the Assessment and Plan.    Assessment & Plan   Erythrasma with intertrigo, improved with treatment, not at patient goal Exam: improvement in lesion. Prior erythematous patch on left hemiscrotum/inguinal crease/medial thigh is now hyperpigmented with mild erythema and scaling in left inguinal crease   Treatment Plan:  Continue Clindamycin Lotion apply to aa daily  Start Opzelura cream apply topically to aa daily  Sample given [facility] [id number] Lot [id number] Exp 04/18/2024  Will recheck in 2 weeks.    Return for 2 week follow up on rash .  I, Asher Muir, CMA, am acting as scribe for Elie Goody, MD.   Documentation: I have reviewed the above documentation for accuracy and completeness, and I agree with the above.  Elie Goody, MD

## 2023-11-10 ENCOUNTER — Ambulatory Visit: Payer: Medicare Other

## 2023-11-12 ENCOUNTER — Other Ambulatory Visit: Payer: Self-pay | Admitting: Internal Medicine

## 2023-11-12 DIAGNOSIS — M25512 Pain in left shoulder: Secondary | ICD-10-CM

## 2023-11-12 DIAGNOSIS — M5030 Other cervical disc degeneration, unspecified cervical region: Secondary | ICD-10-CM

## 2023-11-12 DIAGNOSIS — M19012 Primary osteoarthritis, left shoulder: Secondary | ICD-10-CM

## 2023-11-12 DIAGNOSIS — R519 Headache, unspecified: Secondary | ICD-10-CM

## 2023-11-12 DIAGNOSIS — M542 Cervicalgia: Secondary | ICD-10-CM

## 2023-11-23 ENCOUNTER — Ambulatory Visit: Payer: Medicare Other | Admitting: *Deleted

## 2023-11-23 VITALS — BP 134/84 | HR 64 | Temp 97.0°F | Ht 74.0 in | Wt 211.2 lb

## 2023-11-23 DIAGNOSIS — Z23 Encounter for immunization: Secondary | ICD-10-CM | POA: Diagnosis not present

## 2023-11-23 DIAGNOSIS — Z0000 Encounter for general adult medical examination without abnormal findings: Secondary | ICD-10-CM

## 2023-11-23 NOTE — Progress Notes (Signed)
Subjective:   Brian Atkins is a 67 y.o. male who presents for Medicare Annual/Subsequent preventive examination.  Visit Complete: In person   Cardiac Risk Factors include: advanced age (>12men, >25 women);male gender;dyslipidemia;hypertension;Other (see comment), Risk factor comments: CAD     Objective:    Today's Vitals   11/23/23 1534 11/23/23 1538  BP: 134/84   Pulse: 64   Temp: (!) 97 F (36.1 C)   TempSrc: Skin   SpO2: 97%   Weight: 211 lb 4 oz (95.8 kg)   Height: 6\' 2"  (1.88 m)   PainSc:  3    Body mass index is 27.12 kg/m.     11/23/2023    3:57 PM 02/14/2023    4:34 PM 11/08/2022    3:03 PM 03/29/2022    1:51 PM 03/29/2022    9:00 AM 03/28/2022    3:44 PM 01/13/2022    3:55 PM  Advanced Directives  Does Patient Have a Medical Advance Directive? Yes Yes Yes Yes Yes Yes Yes  Type of Estate agent of [place];Living will  Healthcare Power of [place];Living will Healthcare Power of [place];Living will Healthcare Power of [place];Living will Healthcare Power of [place];Living will Living will  Does patient want to make changes to medical advance directive?   No - Patient declined  No - Guardian declined  Yes (Inpatient - patient defers changing a medical advance directive at this time - Information given)  Copy of Healthcare Power of Attorney in Chart? No - copy requested  Yes - validated most recent copy scanned in chart (See row information)  No - copy requested      Current Medications (verified) Outpatient Encounter Medications as of 11/23/2023  Medication Sig   albuterol (VENTOLIN HFA) 108 (90 Base) MCG/ACT inhaler Inhale 2 puffs into the lungs every 6 (six) hours as needed for wheezing or shortness of breath.   carvedilol (COREG) 12.5 MG tablet Take 1.5 tablets (18.75 mg total) by mouth 2 (two) times daily with a meal.   clindamycin (CLEOCIN-T) 1 % lotion Apply twice daily to affected rash for 2 weeks.   cyanocobalamin (VITAMIN B12) 1000  MCG/ML injection INJECT INTO THE MUSCLE EVERY 30 DAYS   ELIQUIS 5 MG TABS tablet TAKE ONE TABLET BY MOUTH TWICE A DAY   Eszopiclone 3 MG TABS TAKE ONE TABLET BY MOUTH AT BEDTIME ALTERNATING WITH AMBIEN   ezetimibe (ZETIA) 10 MG tablet Take 1 tablet by mouth every morning.   famotidine (PEPCID) 20 MG tablet Take 20 mg by mouth 2 (two) times daily.   furosemide (LASIX) 40 MG tablet Take 40 mg by mouth as needed.   hydrochlorothiazide (MICROZIDE) 12.5 MG capsule TAKE ONE CAPSULE BY MOUTH ONE TIME DAILY   losartan (COZAAR) 100 MG tablet TAKE ONE TABLET BY MOUTH ONE TIME DAILY (Patient taking differently: Take 100 mg by mouth at bedtime.)   Multiple Vitamins-Minerals (MULTIVITAMIN WITH MINERALS) tablet Take 1 tablet by mouth daily.   nitroGLYCERIN (NITROSTAT) 0.4 MG SL tablet Place 1 tablet (0.4 mg total) under the tongue every 5 (five) minutes as needed for chest pain. Maximum of 3 doses.   omeprazole (PRILOSEC) 40 MG capsule Take 40 mg by mouth every morning.   ondansetron (ZOFRAN) 4 MG tablet Take 1 tablet (4 mg total) by mouth every 8 (eight) hours as needed.   ondansetron (ZOFRAN) 4 MG tablet Take 1 tablet (4 mg total) by mouth every 8 (eight) hours as needed for nausea or vomiting.   ONETOUCH  VERIO test strip USE TO CHECK BLOOD SUGAR AS NEEDED   Prucalopride Succinate (MOTEGRITY) 2 MG TABS Take 2 mg by mouth daily as needed (constipation).   rOPINIRole (REQUIP) 0.25 MG tablet Take 0.25 mg by mouth in the morning and at bedtime.    Syringe/Needle, Disp, (SYRINGE 3CC/25GX1") 25G X 1" 3 ML MISC Use for b12 injections   tiZANidine (ZANAFLEX) 4 MG tablet TAKE ONE-HALF TO ONE TABLET BY MOUTH AT BEDTIME AS NEEDED FOR MUSCLE SPASM   triamcinolone (NASACORT) 55 MCG/ACT AERO nasal inhaler Place 2 sprays into the nose every 8 (eight) hours as needed.   zolpidem (AMBIEN CR) 6.25 MG CR tablet TAKE TWO TABLETS BY MOUTH AT BEDTIME AS NEEDED FOR SLEEP   Alirocumab (PRALUENT) 150 MG/ML SOAJ Inject 1 mL (150  mg total) into the skin every 14 (fourteen) days. (Patient not taking: Reported on 11/23/2023)   DULoxetine (CYMBALTA) 20 MG capsule Take 20 mg by mouth as needed. (Patient not taking: Reported on 11/23/2023)   [DISCONTINUED] oxyCODONE (OXY IR/ROXICODONE) 5 MG immediate release tablet Take 1 tablet (5 mg total) by mouth every 4 (four) hours as needed for severe pain. (Patient not taking: Reported on 11/23/2023)   No facility-administered encounter medications on file as of 11/23/2023.    Allergies (verified) Avelox [moxifloxacin], Gadolinium derivatives, Contrast media [iodinated contrast media], Crestor [rosuvastatin calcium], Morphine and codeine, Niacin, Niacin-simvastatin er, Latex, Tape, Tapentadol, and Wound dressing adhesive   History: Past Medical History:  Diagnosis Date   Complication of anesthesia    a.) delayed emergence   Coronary artery disease, non-occlusive    a. 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)    a.) stage IV with mets to orbits and spleen; Tx'd with splenectomy + XRT + chemotherapy  with associated angioedema reaction requiring intubation (06/27/2014 - 07/02/2014)   Family history of adverse reaction to anesthesia    a.) delayed emergence in 1st degree relative (mother)   GERD (gastroesophageal reflux disease)    Hemorrhoids    Hiatal hernia    History of 2019 novel coronavirus disease (COVID-19) 12/24/2019   History of 2019 novel coronavirus disease (COVID-19) 12/24/2019   History of kidney stones    Hx of splenectomy    a.) secondary MALT lymphoma   Idiopathic angioedema    a.) etiology felt  to be related to XRT; "two hours after receiving bilateral orbital radiation patient was noted to have increased swelling of the orbital area which progressed to include the cheek and lips, an intense itching of the eyes bilaterally";  reaction resulted in intubation (06/27/2014 - 07/02/2014)   Insomnia    a.) on prescribed hypnotics (zolpidem + eszopiclone)   Labile hypertension    Left rotator cuff tear    Long term current use of anticoagulant    a.) apixaban   Medial epicondylitis of right elbow    Meralgia paresthetica of right side    Microscopic hematuria    Myalgia due to statin    Ocular migraine    OSA on CPAP    Pneumonia    Prediabetes  PSVT (paroxysmal supraventricular tachycardia) (HCC)    Pulmonary emboli (HCC) 03/28/2022   a. assoicated (+) RIGHT heart strain; required mechanical thrombectomy   RLS (restless legs syndrome)    a.) on ropinirole   Splenic vein thrombosis    a.) treated with enoxaparin BID x 6 months   Past Surgical History:  Procedure Laterality Date   BRONCHIAL WASHINGS N/A 08/21/2021   Procedure: BRONCHIAL WASHINGS;  Surgeon: Vida Rigger, MD;  Location: [facility];  Service: Thoracic;  Laterality: N/A;   COLONOSCOPY     CORONARY PRESSURE/FFR STUDY N/A 12/07/2017   Procedure: INTRAVASCULAR PRESSURE WIRE/FFR STUDY;  Surgeon: Yvonne Kendall, MD;  Location: [facility];  Service: Cardiovascular;  Laterality: N/A;   FLEXIBLE BRONCHOSCOPY N/A 08/21/2021   Procedure: FLEXIBLE BRONCHOSCOPY;  Surgeon: Vida Rigger, MD;  Location: [facility];  Service: Thoracic;  Laterality: N/A;   LEFT HEART CATH AND CORONARY ANGIOGRAPHY Left 12/07/2017   Procedure: LEFT HEART CATH AND CORONARY ANGIOGRAPHY;  Surgeon: Yvonne Kendall, MD;  Location: [facility];  Service: Cardiovascular;  Laterality: Left;   nissen funduplication  2009   [name]   PULMONARY THROMBECTOMY N/A 03/29/2022   Procedure: PULMONARY THROMBECTOMY;  Surgeon: Annice Needy, MD;   Location: [facility];  Service: Cardiovascular;  Laterality: N/A;   SEPTOPLASTY Bilateral 08/23/2019   Procedure: SEPTOPLASTY;  Surgeon: Vernie Murders, MD;  Location: [facility];  Service: ENT;  Laterality: Bilateral;   SHOULDER ARTHROSCOPY Left 02/22/2023   Procedure: ARTHROSCOPY SHOULDER;  Surgeon: Juanell Fairly, MD;  Location: [facility];  Service: Orthopedics;  Laterality: Left;   SHOULDER ARTHROSCOPY WITH OPEN ROTATOR CUFF REPAIR AND DISTAL CLAVICLE ACROMINECTOMY Left 02/22/2023   Procedure: SHOULDER ARTHROSCOPY WITH OPEN ROTATOR CUFF REPAIR, DISTAL CLAVICLE ACROMINECTOMY, SUBACROMIAL DECOMPRESSION, DISTAL CLAVICLE EXCISION, AND BICEPS TENODESIS;  Surgeon: Juanell Fairly, MD;  Location: [facility];  Service: Orthopedics;  Laterality: Left;   SPLENECTOMY     TURBINATE REDUCTION Bilateral 08/23/2019   Procedure: TURBINATE REDUCTION;  Surgeon: Vernie Murders, MD;  Location[facility];  Service: ENT;  Laterality: Bilateral;  Latex sensitivity sleep apnea   Family History  Problem Relation Age of Onset   Coronary artery disease Father 31       CABG   Aortic aneurysm Father 32       repaired   Hyperlipidemia Father    Lung cancer Mother    Hypertension Mother    Cancer Mother        bladder   COPD Mother    Stomach cancer Maternal Grandfather    Social History   Socioeconomic History   Marital status: Married    Spouse name: Not on file   Number of children: 2   Years of education: 14   Highest education level: Not on file  Occupational History   Occupation: Shop Nurse, mental health: [employer]    Comment: [employer]  Tobacco Use   Smoking status: Never    Passive exposure: Past   Smokeless tobacco: Never  Vaping Use   Vaping status: Never Used  Substance and Sexual Activity   Alcohol use: No   Drug use: No   Sexual activity: Yes  Other Topics Concern   Not on file  Social History Narrative   Married   Works    Social Determinants of  Health   Financial Resource Strain: Low Risk  (11/23/2023)   Overall Financial Resource Strain (CARDIA)    Difficulty of Paying Living Expenses:  Not hard at all  Food Insecurity: No Food Insecurity (11/23/2023)   Hunger Vital Sign    Worried About Running Out of Food in the Last Year: Never true    Ran Out of Food in the Last Year: Never true  Transportation Needs: No Transportation Needs (11/23/2023)   PRAPARE - Administrator, Civil Service (Medical): No    Lack of Transportation (Non-Medical): No  Physical Activity: Inactive (11/23/2023)   Exercise Vital Sign    Days of Exercise per Week: 0 days    Minutes of Exercise per Session: 0 min  Stress: No Stress Concern Present (11/23/2023)   [employer] of Occupational Health - Occupational Stress Questionnaire    Feeling of Stress : Not at all  Social Connections: Moderately Integrated (11/23/2023)   Social Connection and Isolation Panel [NHANES]    Frequency of Communication with Friends and Family: More than three times a week    Frequency of Social Gatherings with Friends and Family: More than three times a week    Attends Religious Services: More than 4 times per year    Active Member of [employer] or Organizations: No    Attends Engineer, structural: Never    Marital Status: Married    Tobacco Counseling Counseling given: Not Answered   Clinical Intake:  Pre-visit preparation completed: Yes  Pain : 0-10 Pain Score: 3  Pain Type: Chronic pain Pain Location: Abdomen (Seeing GI doctor) Pain Descriptors / Indicators: Dull, Nagging Pain Onset: More than a month ago Pain Frequency: Intermittent     BMI - recorded: 27.12 Nutritional Status: BMI 25 -29 Overweight Nutritional Risks: Nausea/ vomitting/ diarrhea (seeing GI doctor) Diabetes: No  How often do you need to have someone help you when you read instructions, pamphlets, or other written materials from your doctor or pharmacy?: 1 -  Never  Interpreter Needed?: No  Information entered by :: R. Lanayah Gartley LPN   Activities of Daily Living    11/23/2023    3:40 PM 02/14/2023    4:22 PM  In your present state of health, do you have any difficulty performing the following activities:  Hearing? 1 0  Comment wears aids   Vision? 0 0  Comment glasses   Difficulty concentrating or making decisions? 0 0  Walking or climbing stairs? 0 0  Dressing or bathing? 0 0  Doing errands, shopping? 0 0  Preparing Food and eating ? N   Using the Toilet? N   In the past six months, have you accidently leaked urine? N   Do you have problems with loss of bowel control? N   Managing your Medications? N   Managing your Finances? N   Housekeeping or managing your Housekeeping? N     Patient Care Team: Sherlene Shams, MD as PCP - General (Internal Medicine) End, Cristal Deer, MD as PCP - Cardiology (Cardiology) Brown Human, MD (Internal Medicine)  Indicate any recent Medical Services you may have received from other than [facility] providers in the past year (date may be approximate).     Assessment:   This is a routine wellness examination for Ayansh.  Hearing/Vision screen Hearing Screening - Comments:: Wears aids Vision Screening - Comments:: glasses   Goals Addressed             This Visit's Progress    Patient Stated       Wants to start walking       Depression Screen    11/23/2023  3:47 PM 02/07/2023   10:12 AM 01/24/2023    2:05 PM 11/08/2022    2:57 PM 08/24/2022    3:33 PM 07/12/2022    2:10 PM 06/29/2022    4:52 PM  PHQ 2/9 Scores  PHQ - 2 Score 0 0 0 0 0 0 0  PHQ- 9 Score 2 4         Fall Risk    11/23/2023    3:42 PM 02/07/2023   10:12 AM 01/24/2023    1:53 PM 11/08/2022    3:05 PM 08/24/2022    3:33 PM  Fall Risk   Falls in the past year? 0 0 0 0 0  Number falls in past yr: 0 0 0 0   Injury with Fall? 0 0 0 0   Risk for fall due to : No Fall Risks No Fall Risks No Fall Risks No Fall Risks No Fall  Risks  Follow up Falls prevention discussed;Falls evaluation completed Falls evaluation completed Falls evaluation completed Falls evaluation completed;Falls prevention discussed Falls evaluation completed    MEDICARE RISK AT HOME: Medicare Risk at Home Any stairs in or around the home?: Yes If so, are there any without handrails?: No Home free of loose throw rugs in walkways, pet beds, electrical cords, etc?: Yes Adequate lighting in your home to reduce risk of falls?: Yes Life alert?: No Use of a cane, walker or w/c?: No Grab bars in the bathroom?: No Shower chair or bench in shower?: No Elevated toilet seat or a handicapped toilet?: Yes  TIMED UP AND GO:  Was the test performed?  Yes  Length of time to ambulate 10 feet: 8 sec Gait steady and fast without use of assistive device    Cognitive Function:        11/23/2023    3:57 PM 11/08/2022    3:19 PM  6CIT Screen  What Year? 0 points 0 points  What month? 0 points 0 points  What time? 0 points 0 points  Count back from 20 0 points 0 points  Months in reverse 0 points 0 points  Repeat phrase 2 points 0 points  Total Score 2 points 0 points    Immunizations Immunization History  Administered Date(s) Administered   Fluad Quad(high Dose 65+) 10/24/2020, 10/26/2021   HIB (PRP-T) 05/29/2015   Influenza Inj Mdck Quad Pf 09/29/2019   Influenza Inj Mdck Quad With Preservative 11/26/2018   Influenza Whole 09/18/2010   Influenza,inj,Quad PF,6+ Mos 09/14/2016   Influenza-Unspecified 11/19/2012, 10/09/2013, 10/11/2015, 09/30/2021, 12/06/2022   PFIZER(Purple Top)SARS-COV-2 Vaccination 04/05/2020, 05/03/2020   Pneumococcal Conjugate-13 09/07/2016   Pneumococcal Polysaccharide-23 02/17/2015, 10/16/2020   Respiratory Syncytial Virus Vaccine,Recomb Aduvanted(Arexvy) 12/06/2022   Tdap 08/25/2008    TDAP status: Due, Education has been provided regarding the importance of this vaccine. Advised may receive this vaccine at local  pharmacy or Health Dept. Aware to provide a copy of the vaccination record if obtained from local pharmacy or Health Dept. Verbalized acceptance and understanding.  Flu Vaccine status: Due, Education has been provided regarding the importance of this vaccine. Advised may receive this vaccine at local pharmacy or Health Dept. Aware to provide a copy of the vaccination record if obtained from local pharmacy or Health Dept. Verbalized acceptance and understanding. Patient decided to have the flu shot here today.  Pneumococcal vaccine status: Up to date  Covid-19 vaccine status: Information provided on how to obtain vaccines.   Qualifies for Shingles Vaccine? Yes   Zostavax completed No  Shingrix Completed?: No.    Education has been provided regarding the importance of this vaccine. Patient has been advised to call insurance company to determine out of pocket expense if they have not yet received this vaccine. Advised may also receive vaccine at local pharmacy or Health Dept. Verbalized acceptance and understanding.  Screening Tests Health Maintenance  Topic Date Due   Hepatitis C Screening  Never done   Zoster Vaccines- Shingrix (1 of 2) Never done   DTaP/Tdap/Td (2 - Td or Tdap) 08/25/2018   COVID-19 Vaccine (3 - Pfizer risk series) 05/31/2020   INFLUENZA VACCINE  07/21/2023   Medicare Annual Wellness (AWV)  11/09/2023   Pneumonia Vaccine 59+ Years old (4 of 4 - PPSV23 or PCV20) 10/16/2025   Colonoscopy  11/14/2033   HPV VACCINES  Aged Out    Health Maintenance  Health Maintenance Due  Topic Date Due   Hepatitis C Screening  Never done   Zoster Vaccines- Shingrix (1 of 2) Never done   DTaP/Tdap/Td (2 - Td or Tdap) 08/25/2018   COVID-19 Vaccine (3 - Pfizer risk series) 05/31/2020   INFLUENZA VACCINE  07/21/2023   Medicare Annual Wellness (AWV)  11/09/2023    Colorectal cancer screening: Type of screening: Colonoscopy. Completed 10/2023. Repeat every 10 years  Lung Cancer  Screening: (Low Dose CT Chest recommended if Age 15-80 years, 20 pack-year currently smoking OR have quit w/in 15years.) does not qualify.   Additional Screening:  Hepatitis C Screening: does qualify; Completed declined  Vision Screening: Recommended annual ophthalmology exams for early detection of glaucoma and other disorders of the eye. Is the patient up to date with their annual eye exam?  Yes  Who is the provider or what is the name of the office in which the patient attends annual eye exams? [facility] If pt is not established with a provider, would they like to be referred to a provider to establish care? No .   Dental Screening: Recommended annual dental exams for proper oral hygiene    Community Resource Referral / Chronic Care Management: CRR required this visit?  No   CCM required this visit?  No     Plan:     I have personally reviewed and noted the following in the patient's chart:   Medical and social history Use of alcohol, tobacco or illicit drugs  Current medications and supplements including opioid prescriptions. Patient is not currently taking opioid prescriptions. Functional ability and status Nutritional status Physical activity Advanced directives List of other physicians Hospitalizations, surgeries, and ER visits in previous 12 months Vitals Screenings to include cognitive, depression, and falls Referrals and appointments  In addition, I have reviewed and discussed with patient certain preventive protocols, quality metrics, and best practice recommendations. A written personalized care plan for preventive services as well as general preventive health recommendations were provided to patient.     Sydell Axon, LPN   [id number]   After Visit Summary: (MyChart) Due to this being a telephonic visit, the after visit summary with patients personalized plan was offered to patient via MyChart   Nurse Notes: None

## 2023-11-23 NOTE — Patient Instructions (Signed)
[name] , Thank you for taking time to come for your Medicare Wellness Visit. I appreciate your ongoing commitment to your health goals. Please review the following plan we discussed and let me know if I can assist you in the future.   Referrals/Orders/Follow-Ups/Clinician Recommendations: Remember to update your Tetanus and shingles vaccines  This is a list of the screening recommended for you and due dates:  Health Maintenance  Topic Date Due   Hepatitis C Screening  Never done   Zoster (Shingles) Vaccine (1 of 2) Never done   DTaP/Tdap/Td vaccine (2 - Td or Tdap) 08/25/2018   COVID-19 Vaccine (3 - Pfizer risk series) 05/31/2020   Medicare Annual Wellness Visit  11/22/2024   Pneumonia Vaccine (4 of 4 - PPSV23 or PCV20) 10/16/2025   Colon Cancer Screening  11/14/2033   Flu Shot  Completed   HPV Vaccine  Aged Out    Advanced directives: (Copy Requested) Please bring a copy of your health care power of attorney and living will to the office to be added to your chart at your convenience.  Next Medicare Annual Wellness Visit scheduled for next year: Yes 11/27/24 @ 3:40

## 2023-11-24 ENCOUNTER — Encounter: Payer: Self-pay | Admitting: Dermatology

## 2023-11-24 ENCOUNTER — Ambulatory Visit: Payer: Medicare Other | Admitting: Dermatology

## 2023-11-24 DIAGNOSIS — L304 Erythema intertrigo: Secondary | ICD-10-CM | POA: Diagnosis not present

## 2023-11-24 DIAGNOSIS — L820 Inflamed seborrheic keratosis: Secondary | ICD-10-CM

## 2023-11-24 DIAGNOSIS — L918 Other hypertrophic disorders of the skin: Secondary | ICD-10-CM | POA: Diagnosis not present

## 2023-11-24 DIAGNOSIS — Z7189 Other specified counseling: Secondary | ICD-10-CM

## 2023-11-24 NOTE — Patient Instructions (Addendum)
Cryotherapy Aftercare  Wash gently with soap and water everyday.   Apply Vaseline Jelly daily until healed.     Due to recent changes in healthcare laws, you may see results of your pathology and/or laboratory studies on MyChart before the doctors have had a chance to review them. We understand that in some cases there may be results that are confusing or concerning to you. Please understand that not all results are received at the same time and often the doctors may need to interpret multiple results in order to provide you with the best plan of care or course of treatment. Therefore, we ask that you please give [place] 2 business days to thoroughly review all your results before contacting the office for clarification. Should we see a critical lab result, you will be contacted sooner.   If You Need Anything After Your Visit  If you have any questions or concerns for your doctor, please call our main line at [phone] and press option 4 to reach your doctor's medical assistant. If no one answers, please leave a voicemail as directed and we will return your call as soon as possible. Messages left after 4 pm will be answered the following business day.   You may also send [place] a message via MyChart. We typically respond to MyChart messages within 1-2 business days.  For prescription refills, please ask your pharmacy to contact our office. Our fax number is [phone].  If you have an urgent issue when the clinic is closed that cannot wait until the next business day, you can page your doctor at the number below.    Please note that while we do our best to be available for urgent issues outside of office hours, we are not available 24/7.   If you have an urgent issue and are unable to reach [place], you may choose to seek medical care at your doctor's office, retail clinic, urgent care center, or emergency room.  If you have a medical emergency, please immediately call 911 or go to the emergency  department.  Pager Numbers  - Dr. Gwen Pounds: [phone]  - Dr. Roseanne Reno: [phone]  - Dr. Katrinka Blazing: [phone]   In the event of inclement weather, please call our main line at [phone] for an update on the status of any delays or closures.  Dermatology Medication Tips: Please keep the boxes that topical medications come in in order to help keep track of the instructions about where and how to use these. Pharmacies typically print the medication instructions only on the boxes and not directly on the medication tubes.   If your medication is too expensive, please contact our office at [phone] option 4 or send [place] a message through MyChart.   We are unable to tell what your co-pay for medications will be in advance as this is different depending on your insurance coverage. However, we may be able to find a substitute medication at lower cost or fill out paperwork to get insurance to cover a needed medication.   If a prior authorization is required to get your medication covered by your insurance company, please allow [place] 1-2 business days to complete this process.  Drug prices often vary depending on where the prescription is filled and some pharmacies may offer cheaper prices.  The website www.goodrx.com contains coupons for medications through different pharmacies. The prices here do not account for what the cost may be with help from insurance (it may be cheaper with your insurance), but the website can  give you the price if you did not use any insurance.  - You can print the associated coupon and take it with your prescription to the pharmacy.  - You may also stop by our office during regular business hours and pick up a GoodRx coupon card.  - If you need your prescription sent electronically to a different pharmacy, notify our office through [facility] or by phone at [phone] option 4.     Si Usted Necesita Algo Despus de Su Visita  Tambin puede enviarnos  un mensaje a travs de Clinical cytogeneticist. Por lo general respondemos a los mensajes de MyChart en el transcurso de 1 a 2 das hbiles.  Para renovar recetas, por favor pida a su farmacia que se ponga en contacto con nuestra oficina. Annie Sable de fax es [place] [phone].  Si tiene un asunto urgente cuando la clnica est cerrada y que no puede esperar hasta el siguiente da hbil, puede llamar/localizar a su doctor(a) al nmero que aparece a continuacin.   Por favor, tenga en cuenta que aunque hacemos todo lo posible para estar disponibles para asuntos urgentes fuera del horario de [place], no estamos disponibles las 24 horas del da, los 7 [address]  [address] de la [place].   Si tiene un problema urgente y no puede comunicarse con nosotros, puede optar por buscar atencin mdica  en el consultorio de su doctor(a), en una clnica privada, en un centro de atencin urgente o en una sala de emergencias.  Si tiene Engineer, drilling, por favor llame inmediatamente al 911 o vaya a la sala de emergencias.  Nmeros de bper  - Dr. Gwen Pounds: [phone]  - Dra. Roseanne Reno: [phone]  - Dr. Katrinka Blazing: [phone]   En caso de inclemencias del tiempo, por favor llame a Lacy Duverney principal al [phone] para una actualizacin sobre el [place] de cualquier retraso o cierre.  Consejos para la medicacin en dermatologa: Por favor, guarde las cajas en las que vienen los medicamentos de uso tpico para ayudarle a seguir las instrucciones sobre dnde y cmo usarlos. Las farmacias generalmente imprimen las instrucciones del medicamento slo en las cajas y no directamente en los tubos del [place].   Si su medicamento es muy caro, por favor, pngase en contacto con Rolm Gala llamando al [phone] y presione la opcin 4 o envenos un mensaje a travs de Clinical cytogeneticist.   No podemos decirle cul ser su copago por los medicamentos por adelantado ya que esto es diferente dependiendo de la cobertura de su seguro. Sin  embargo, es posible que podamos encontrar un medicamento sustituto a Audiological scientist un formulario para que el seguro cubra el medicamento que se considera necesario.   Si se requiere una autorizacin previa para que su compaa de seguros [place] su medicamento, por favor permtanos de 1 a 2 das hbiles para completar [address]  Los precios de los medicamentos varan con frecuencia dependiendo del Environmental consultant de dnde se surte la receta y alguna farmacias pueden ofrecer precios ms baratos.  El sitio web www.goodrx.com tiene cupones para medicamentos de Health and safety inspector. Los precios aqu no tienen en cuenta lo que podra costar con la ayuda del seguro (puede ser ms barato con su seguro), pero el sitio web puede darle el precio si no utiliz Tourist information centre manager.  - Puede imprimir el cupn correspondiente y llevarlo con su receta a la farmacia.  - Tambin puede pasar por nuestra oficina durante el horario de atencin regular y Education officer, museum una tarjeta de cupones de GoodRx.  -  Si necesita que su receta se enve electrnicamente a Psychiatrist, informe a nuestra oficina a travs de MyChart de [place] o por telfono llamando al [phone] y presione la opcin 4.

## 2023-11-24 NOTE — Progress Notes (Signed)
   Follow Up Visit   Subjective  Brian Atkins is a 67 y.o. male who presents for the following: Rash at inguinal folds/medial thigh/hemiscrotum. Using Clindamycin lotion daily.  Has been using Opzelura cream once a day. Has not noticed much improvement because he cannot see the area.   Lesions at left groin crease are rubbed and irritated by clothing and movement.   The following portions of the chart were reviewed this encounter and updated as appropriate: medications, allergies, medical history  Review of Systems:  No other skin or systemic complaints except as noted in HPI or Assessment and Plan.  Objective  Well appearing patient in no apparent distress; mood and affect are within normal limits.  A focused examination was performed of the following areas: Groin, thighs  Relevant exam findings are noted in the Assessment and Plan.  left groin x2 Erythematous keratotic or waxy stuck-on papule or plaque.  Left Inguinal Area (3) Fleshy, skin-colored pedunculated papules.      Assessment & Plan   Inflamed seborrheic keratosis left groin x2  Symptomatic, irritating, patient would like treated.  Destruction of lesion - left groin x2 Complexity: simple   Destruction method: cryotherapy   Informed consent: discussed and consent obtained   Timeout:  patient name, date of birth, surgical site, and procedure verified Lesion destroyed using liquid nitrogen: Yes   Region frozen until ice ball extended beyond lesion: Yes   Outcome: patient tolerated procedure well with no complications   Post-procedure details: wound care instructions given   Additional details:  Prior to procedure, discussed risks of blister formation, small wound, skin dyspigmentation, or rare scar following cryotherapy. Recommend Vaseline ointment to treated areas while healing.   Inflamed acrochordon (3) Left Inguinal Area  Destruction of lesion - Left Inguinal Area (3) Complexity: simple   Destruction  method: cryotherapy   Informed consent: discussed and consent obtained   Timeout:  patient name, date of birth, surgical site, and procedure verified Lesion destroyed using liquid nitrogen: Yes   Region frozen until ice ball extended beyond lesion: Yes   Outcome: patient tolerated procedure well with no complications   Post-procedure details: wound care instructions given     Erythrasma/Intertrigo Exam: Clear today Treatment:  The patient will observe these symptoms, and report promptly any worsening or unexpected persistence.  If well, may return prn.   Return for UBSE As Scheduled, With Dr. Gwen Pounds.  I, Lawson Radar, CMA, am acting as scribe for Armida Sans, MD.   Documentation: I have reviewed the above documentation for accuracy and completeness, and I agree with the above.  Armida Sans, MD

## 2023-12-05 ENCOUNTER — Encounter: Payer: Self-pay | Admitting: Physician Assistant

## 2023-12-05 ENCOUNTER — Ambulatory Visit: Payer: Medicare Other | Attending: Physician Assistant | Admitting: Physician Assistant

## 2023-12-05 VITALS — BP 130/77 | HR 74 | Ht 74.0 in | Wt 217.2 lb

## 2023-12-05 DIAGNOSIS — I2510 Atherosclerotic heart disease of native coronary artery without angina pectoris: Secondary | ICD-10-CM | POA: Diagnosis not present

## 2023-12-05 DIAGNOSIS — Z789 Other specified health status: Secondary | ICD-10-CM | POA: Diagnosis present

## 2023-12-05 DIAGNOSIS — Z86711 Personal history of pulmonary embolism: Secondary | ICD-10-CM | POA: Diagnosis not present

## 2023-12-05 DIAGNOSIS — E785 Hyperlipidemia, unspecified: Secondary | ICD-10-CM | POA: Insufficient documentation

## 2023-12-05 DIAGNOSIS — R0609 Other forms of dyspnea: Secondary | ICD-10-CM | POA: Insufficient documentation

## 2023-12-05 DIAGNOSIS — R002 Palpitations: Secondary | ICD-10-CM | POA: Diagnosis present

## 2023-12-05 DIAGNOSIS — I10 Essential (primary) hypertension: Secondary | ICD-10-CM | POA: Insufficient documentation

## 2023-12-05 MED ORDER — NITROGLYCERIN 0.4 MG SL SUBL: 0.4000 mg | SUBLINGUAL_TABLET | SUBLINGUAL | 3 refills | Status: DC | PRN

## 2023-12-05 MED ORDER — REPATHA SURECLICK 140 MG/ML ~~LOC~~ SOAJ: 140.0000 mg | SUBCUTANEOUS | 3 refills | Status: DC

## 2023-12-05 MED ORDER — CARVEDILOL 25 MG PO TABS: 25.0000 mg | ORAL_TABLET | Freq: Two times a day (BID) | ORAL | 3 refills | Status: DC

## 2023-12-05 NOTE — Progress Notes (Signed)
Cardiology Office Note    Date:  12/05/2023   ID:  Brian Atkins, DOB 14-Oct-1956, MRN [id number]  PCP:  Sherlene Shams, MD  Cardiologist:  Yvonne Kendall, MD  Electrophysiologist:  None   Chief Complaint: Follow-up  History of Present Illness:   Brian Atkins is a 67 y.o. male with history of CAD by LHC in 11/2017 and coronary CTA in 11/2019, marginal zone B-cell lymphoma, restrictive lung disease, SBO requiring emergent surgery, PE with prior DVT/PE, HTN, HLD with statin intolerance, peripheral neuropathy, sleep apnea on CPAP, and GERD status post Nissen fundoplication who presents for follow-up of nonobstructive CAD.   Brian Atkins was evaluated in 11/2017 for chest pain, palpitations, headaches, and elevated BP.  Brian Atkins subsequently underwent LHC which revealed moderate nonobstructive CAD involving the proximal/mid LAD and mid RCA.  Both lesions were not hemodynamically significant by fractional flow reserve (FFR of LAD 0.84, FFR RCA 0.94).  LVEF greater than 65% with a moderately elevated LVEDP consistent with diastolic dysfunction.  Medical therapy was recommended.  Echo 12/29/17 showed an EF of 60-65%, normal wall motion, normal LV diastolic function, and a mildly dilated LA.  Brian Atkins was seen in 11/2019 with recurrent chest pain and underwent coronary CTA which showed a calcium score of 496 and was the 86th percentile.  Brian Atkins had mild diffuse nonobstructive CAD with calcified plaque noted in the left main, LAD, LCx, and RCA with a maximum identified stenosis of 25 to 49%.  Medical therapy was recommended.  Brian Atkins was diagnosed with Covid in 12/2019, and did not require hospital admission.  Brian Atkins was seen in 01/2020 continuing to note persistent exertional dyspnea, fatigue, and cough following his COVID-19 infection.  Brian Atkins reported two-pillow orthopnea and a 5 pound weight gain despite poor appetite since contracting Covid.  In this setting, Brian Atkins underwent echo in 01/2020, which showed a normal LVEF with grade 1 diastolic  dysfunction and no significant valvular abnormalities.  Brian Atkins was evaluated by pulmonology in 02/2020 who felt like his symptoms were likely due to uncontrolled sleep apnea, GERD, and chronic rhinitis.  PFTs were recommended, and ultimately completed in 11/2020, which showed moderate restrictive lung disease with consideration for ILD.  Brian Atkins was seen by cardiology in 03/2020 noting multiple complaints including a several week history of numbness in his legs and feet that was most pronounced when lying down, which is followed by neurology.  Brian Atkins also noted exertional dyspnea that varied from one day to the next.  In the setting of lower extremity swelling, Brian Atkins had increased his Lasix to 40 mg daily and decreased his amlodipine with improvement in symptoms.  Brian Atkins was continued on Lasix 40 mg daily.  His leg pain was most consistent with a neuropathic process though given his risk factors Brian Atkins underwent lower extremity ABIs which were normal.  Brian Atkins was seen in 07/2020 reporting palpitations with ZIO monitor at that demonstrating sinus rhythm with rare PACs and PVCs with no sustained arrhythmia or prolonged pauses.    Brian Atkins was admitted to the hospital in 03/2022 with a bowel obstruction with emergent surgery and PE status post thrombectomy.  During that admission, echo demonstrated an EF of 60 to 65%, no regional wall motion abnormalities, grade 2 diastolic dysfunction, normal RV systolic function with mildly enlarged ventricular cavity size, and aortic valve sclerosis without evidence of stenosis.   Brian Atkins was seen in the office in 07/2022 and was largely recovered from his admission in the spring.  However, Brian Atkins did feel more short  of breath and reported intermittent chest tightness with exertion.  Given symptoms, Brian Atkins underwent echo in 08/2022 which showed an EF of 60 to 65%, no regional wall motion abnormalities, mild LVH, grade 2 diastolic dysfunction, normal RV systolic function and ventricular cavity size, no significant valvular  abnormalities, and an estimated right atrial pressure of 3 mmHg.  Lexiscan MPI in 08/2022 showed no evidence of ischemia.   Brian Atkins was seen in the office on 10/21/2022 and noted an increase in palpitation burden occurring on an almost daily basis and lasting 5 to 10 seconds in duration.  Zio patch showed a predominant rhythm of sinus with an average rate of 66 bpm (range 47 to 128 bpm, 1 run of SVT lasting 4 beats with a maximum rate of 128 bpm, 1 pause occurred lasting 3.3 seconds at 7:48 AM (Sunday, 11/07/2022) and was asymptomatic, and rare atrial/ventricular ectopy.  Patient triggered events corresponded to sinus rhythm and PVCs.   Brian Atkins was seen in the office in 11/2022 and noted improvement in palpitation burden.  Brian Atkins reported intermittent unsteadiness with recommendation to reduce carvedilol to 6.25 mg twice daily.  Brian Atkins was felt to be acceptable risk for noncardiac shoulder surgery.  Brian Atkins was last seen in the office in 03/2023 and continued to note intermittent palpitations described as "heart pounding."  Symptoms were worse with reduction of carvedilol leading him to titrate carvedilol back up to 12.5 mg twice daily.  Exertional fatigue and shortness of breath were stable.  Brian Atkins reported to frequently missing atorvastatin secondary to "brain fog and myalgias."  In this setting, it was recommended Brian Atkins undergo a trial of Praluent to reduce ASCVD risk, however his insurance declined coverage.  Brian Atkins comes in doing well from a cardiac perspective and is currently without symptoms of angina or cardiac decompensation.  Brian Atkins does continue to note some exertional fatigue and dyspnea that are progressive the more Brian Atkins tries to exert himself.  Brian Atkins provides an example of over the summer Brian Atkins would be able to mow the lawn shorter durations due to fatigue and dyspnea requiring more frequent breaks.  Brian Atkins also continues to note longstanding palpitations and dizziness.  Currently taking carvedilol 18.75 mg twice daily.  Brian Atkins has been without  HCTZ for the past 4 to 6 weeks.  No falls, hematochezia, or melena.   Labs independently reviewed: 06/2023 - Hgb 14.9, PLT 363, potassium 4.0, BUN 15, serum creatinine 1.01 01/2023 - TSH normal, A1c 6.0, albumin 4.4, AST/ALT normal, TC 250, TG 234, HDL 33, direct LDL 173   Past Medical History:  Diagnosis Date   Complication of anesthesia    a.) delayed emergence   Coronary artery disease, non-occlusive    a. 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)    a.) stage IV with mets to orbits and spleen; Tx'd with splenectomy + XRT + chemotherapy  with associated angioedema reaction requiring intubation (06/27/2014 - 07/02/2014)   Family history of adverse reaction to anesthesia    a.) delayed emergence in 1st degree relative (mother)   GERD (gastroesophageal reflux disease)    Hemorrhoids  Hiatal hernia    History of 2019 novel coronavirus disease (COVID-19) 12/24/2019   History of 2019 novel coronavirus disease (COVID-19) 12/24/2019   History of kidney stones    Hx of splenectomy    a.) secondary MALT lymphoma   Idiopathic angioedema    a.) etiology felt to be related to XRT; "two hours after receiving bilateral orbital radiation patient was noted to have increased swelling of the orbital area which progressed to include the cheek and lips, an intense itching of the eyes bilaterally";  reaction resulted in intubation (06/27/2014 - 07/02/2014)   Insomnia    a.) on prescribed hypnotics (zolpidem + eszopiclone)   Labile hypertension    Left rotator cuff tear    Long term current use of anticoagulant    a.) apixaban    Medial epicondylitis of right elbow    Meralgia paresthetica of right side    Microscopic hematuria    Myalgia due to statin    Ocular migraine    OSA on CPAP    Pneumonia    Prediabetes    PSVT (paroxysmal supraventricular tachycardia) (HCC)    Pulmonary emboli (HCC) 03/28/2022   a. assoicated (+) RIGHT heart strain; required mechanical thrombectomy   RLS (restless legs syndrome)    a.) on ropinirole   Splenic vein thrombosis    a.) treated with enoxaparin BID x 6 months    Past Surgical History:  Procedure Laterality Date   BRONCHIAL WASHINGS N/A 08/21/2021   Procedure: BRONCHIAL WASHINGS;  Surgeon: Vida Rigger, MD;  Location: [facility];  Service: Thoracic;  Laterality: N/A;   COLONOSCOPY     CORONARY PRESSURE/FFR STUDY N/A 12/07/2017   Procedure: INTRAVASCULAR PRESSURE WIRE/FFR STUDY;  Surgeon: Yvonne Kendall, MD;  Location: [facility];  Service: Cardiovascular;  Laterality: N/A;   FLEXIBLE BRONCHOSCOPY N/A 08/21/2021   Procedure: FLEXIBLE BRONCHOSCOPY;  Surgeon: Vida Rigger, MD;  Location: [facility];  Service: Thoracic;  Laterality: N/A;   LEFT HEART CATH AND CORONARY ANGIOGRAPHY Left 12/07/2017   Procedure: LEFT HEART CATH AND CORONARY ANGIOGRAPHY;  Surgeon: Yvonne Kendall, MD;  Location: [facility];  Service: Cardiovascular;  Laterality: Left;   nissen funduplication  2009   [name]   PULMONARY THROMBECTOMY N/A 03/29/2022   Procedure: PULMONARY THROMBECTOMY;  Surgeon: Annice Needy, MD;  Location: [facility];  Service: Cardiovascular;  Laterality: N/A;   SEPTOPLASTY Bilateral 08/23/2019   Procedure: SEPTOPLASTY;  Surgeon: Vernie Murders, MD;  Location: [facility];  Service: ENT;  Laterality: Bilateral;   SHOULDER ARTHROSCOPY Left 02/22/2023   Procedure: ARTHROSCOPY SHOULDER;  Surgeon: Juanell Fairly, MD;  Location: [facility];  Service: Orthopedics;  Laterality: Left;   SHOULDER ARTHROSCOPY WITH OPEN ROTATOR CUFF REPAIR AND DISTAL  CLAVICLE ACROMINECTOMY Left 02/22/2023   Procedure: SHOULDER ARTHROSCOPY WITH OPEN ROTATOR CUFF REPAIR, DISTAL CLAVICLE ACROMINECTOMY, SUBACROMIAL DECOMPRESSION, DISTAL CLAVICLE EXCISION, AND BICEPS TENODESIS;  Surgeon: Juanell Fairly, MD;  Location: [facility];  Service: Orthopedics;  Laterality: Left;   SPLENECTOMY     TURBINATE REDUCTION Bilateral 08/23/2019   Procedure: TURBINATE REDUCTION;  Surgeon: Vernie Murders, MD;  Location: [facility];  Service: ENT;  Laterality: Bilateral;  Latex sensitivity sleep apnea    Current Medications: Current Meds  Medication Sig   cyanocobalamin (VITAMIN B12) 1000 MCG/ML injection INJECT INTO THE MUSCLE EVERY 30 DAYS   ELIQUIS 5 MG TABS tablet TAKE ONE TABLET BY MOUTH TWICE A DAY   Eszopiclone 3 MG TABS TAKE ONE TABLET  BY MOUTH AT BEDTIME ALTERNATING WITH AMBIEN   Evolocumab (REPATHA SURECLICK) 140 MG/ML SOAJ Inject 140 mg into the skin every 14 (fourteen) days.   ezetimibe (ZETIA) 10 MG tablet Take 1 tablet by mouth every morning.   famotidine (PEPCID) 20 MG tablet Take 20 mg by mouth 2 (two) times daily.   furosemide (LASIX) 40 MG tablet Take 40 mg by mouth as needed.   losartan (COZAAR) 100 MG tablet TAKE ONE TABLET BY MOUTH ONE TIME DAILY (Patient taking differently: Take 100 mg by mouth at bedtime.)   Multiple Vitamins-Minerals (MULTIVITAMIN WITH MINERALS) tablet Take 1 tablet by mouth daily.   omeprazole (PRILOSEC) 40 MG capsule Take 40 mg by mouth every morning.   ondansetron (ZOFRAN) 4 MG tablet Take 1 tablet (4 mg total) by mouth every 8 (eight) hours as needed.   ondansetron (ZOFRAN) 4 MG tablet Take 1 tablet (4 mg total) by mouth every 8 (eight) hours as needed for nausea or vomiting.   ONETOUCH VERIO test strip USE TO CHECK BLOOD SUGAR AS NEEDED   Prucalopride Succinate (MOTEGRITY) 2 MG TABS Take 2 mg by mouth daily as needed (constipation).   rOPINIRole (REQUIP) 0.25 MG tablet Take 0.25 mg by mouth in the morning and at bedtime.     Syringe/Needle, Disp, (SYRINGE 3CC/25GX1") 25G X 1" 3 ML MISC Use for b12 injections   tiZANidine (ZANAFLEX) 4 MG tablet TAKE ONE-HALF TO ONE TABLET BY MOUTH AT BEDTIME AS NEEDED FOR MUSCLE SPASM   triamcinolone (NASACORT) 55 MCG/ACT AERO nasal inhaler Place 2 sprays into the nose every 8 (eight) hours as needed.   zolpidem (AMBIEN CR) 6.25 MG CR tablet TAKE TWO TABLETS BY MOUTH AT BEDTIME AS NEEDED FOR SLEEP   [DISCONTINUED] carvedilol (COREG) 12.5 MG tablet Take 1.5 tablets (18.75 mg total) by mouth 2 (two) times daily with a meal.   [DISCONTINUED] hydrochlorothiazide (MICROZIDE) 12.5 MG capsule TAKE ONE CAPSULE BY MOUTH ONE TIME DAILY   [DISCONTINUED] nitroGLYCERIN (NITROSTAT) 0.4 MG SL tablet Place 1 tablet (0.4 mg total) under the tongue every 5 (five) minutes as needed for chest pain. Maximum of 3 doses.    Allergies:   Avelox [moxifloxacin], Gadolinium derivatives, Contrast media [iodinated contrast media], Crestor [rosuvastatin calcium], Morphine and codeine, Niacin, Niacin-simvastatin er, Latex, Tape, Tapentadol, and Wound dressing adhesive   Social History   Socioeconomic History   Marital status: Married    Spouse name: Not on file   Number of children: 2   Years of education: 14   Highest education level: Not on file  Occupational History   Occupation: Shop Nurse, mental health: [employer]    Comment: [employer]  Tobacco Use   Smoking status: Never    Passive exposure: Past   Smokeless tobacco: Never  Vaping Use   Vaping status: Never Used  Substance and Sexual Activity   Alcohol use: No   Drug use: No   Sexual activity: Yes  Other Topics Concern   Not on file  Social History Narrative   Married   Works    Social Drivers of Corporate investment banker Strain: Low Risk  (11/23/2023)   Overall Financial Resource Strain (CARDIA)    Difficulty of Paying Living Expenses: Not hard at all  Food Insecurity: No Food Insecurity (11/23/2023)   Hunger Vital Sign     Worried About Running Out of Food in the Last Year: Never true    Ran Out of Food in the Last Year: Never  true  Transportation Needs: No Transportation Needs (11/23/2023)   PRAPARE - Administrator, Civil Service (Medical): No    Lack of Transportation (Non-Medical): No  Physical Activity: Inactive (11/23/2023)   Exercise Vital Sign    Days of Exercise per Week: 0 days    Minutes of Exercise per Session: 0 min  Stress: No Stress Concern Present (11/23/2023)   [employer] of Occupational Health - Occupational Stress Questionnaire    Feeling of Stress : Not at all  Social Connections: Moderately Integrated (11/23/2023)   Social Connection and Isolation Panel [NHANES]    Frequency of Communication with Friends and Family: More than three times a week    Frequency of Social Gatherings with Friends and Family: More than three times a week    Attends Religious Services: More than 4 times per year    Active Member of [employer] or Organizations: No    Attends Engineer, structural: Never    Marital Status: Married     Family History:  The patient's family history includes Aortic aneurysm (age of onset: 44) in his father; COPD in his mother; Cancer in his mother; Coronary artery disease (age of onset: 30) in his father; Hyperlipidemia in his father; Hypertension in his mother; Lung cancer in his mother; Stomach cancer in his maternal grandfather.  ROS:   12-point review of systems is negative unless otherwise noted in the HPI.   EKGs/Labs/Other Studies Reviewed:    Studies reviewed were summarized above. The additional studies were reviewed today: As above.    EKG:  EKG is ordered today.  The EKG ordered today demonstrates NSR, 74 bpm, no acute ST-T changes  Recent Labs: 01/24/2023: ALT 13; TSH 1.75 06/21/2023: BUN 15; Creatinine, Ser 1.01; Hemoglobin 14.9; Platelets 363.0; Potassium 4.0; Sodium 139  Recent Lipid Panel    Component Value Date/Time   CHOL 250 (H)  01/24/2023 1451   TRIG 234.0 (H) 01/24/2023 1451   HDL 33.80 (L) 01/24/2023 1451   CHOLHDL 7 01/24/2023 1451   VLDL 46.8 (H) 01/24/2023 1451   LDLCALC 77 07/22/2021 1224   LDLCALC 162 (H) 10/24/2020 1608   LDLDIRECT 173.0 01/24/2023 1451    PHYSICAL EXAM:    VS:  BP 130/77 (BP Location: Left Arm, Patient Position: Sitting)   Pulse 74   Ht 6\' 2"  (1.88 m)   Wt 217 lb 3.2 oz (98.5 kg)   SpO2 97%   BMI 27.89 kg/m   BMI: Body mass index is 27.89 kg/m.  Physical Exam Vitals reviewed.  Constitutional:      Appearance: Brian Atkins is well-developed.  HENT:     Head: Normocephalic and atraumatic.  Eyes:     General:        Right eye: No discharge.        Left eye: No discharge.  Neck:     Vascular: No JVD.  Cardiovascular:     Rate and Rhythm: Normal rate and regular rhythm.     Heart sounds: Normal heart sounds, S1 normal and S2 normal. Heart sounds not distant. No midsystolic click and no opening snap. No murmur heard.    No friction rub.  Pulmonary:     Effort: Pulmonary effort is normal. No respiratory distress.     Breath sounds: Normal breath sounds. No decreased breath sounds, wheezing or rales.  Chest:     Chest wall: No tenderness.  Abdominal:     General: There is no distension.  Musculoskeletal:  Cervical back: Normal range of motion.  Skin:    General: Skin is warm and dry.     Nails: There is no clubbing.  Neurological:     Mental Status: Brian Atkins is alert and oriented to person, place, and time.  Psychiatric:        Speech: Speech normal.        Behavior: Behavior normal.        Thought Content: Thought content normal.        Judgment: Judgment normal.     Wt Readings from Last 3 Encounters:  12/05/23 217 lb 3.2 oz (98.5 kg)  11/23/23 211 lb 4 oz (95.8 kg)  06/21/23 215 lb (97.5 kg)     ASSESSMENT & PLAN:   Nonobstructive CAD with exertional dyspnea and fatigue concerning for possible anginal equivalent: Currently without symptoms of angina or cardiac  decompensation.  Prior LHC and coronary CTA showed nonobstructive disease.  Lexiscan MPI in 08/2022 showed no evidence of ischemia.  Pursue myocardial PET/CT to evaluate for high risk ischemia.  If this is unrevealing, consider pulmonology evaluation and/or CPX.  Continue aggressive risk factor modification and primary prevention including apixaban in place of aspirin given history of recurrent DVT/PE along with ezetimibe, carvedilol, losartan, and as needed SL NTG.  Attempted to place patient on Repatha as outlined below for further risk stratification (insurance denied the Praluent).  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  No evidence of RV dysfunction on prior echo.  HTN: Blood pressure is well-controlled in the office today.  Titrate carvedilol to 25 mg twice daily with continuation of losartan 100 mg.  Discontinue HCTZ.  HLD with statin intolerance: LDL 173 in 01/2023.  Brian Atkins has myalgias associated with simvastatin, rosuvastatin and atorvastatin.  Insurance denied Praluent.  In an effort to reduce ASCVD risk attempt to prescribe Repatha.  Palpitations: Longstanding and stable.  Titrate carvedilol as outlined above.   Informed Consent   Shared Decision Making/Informed Consent{  The risks [chest pain, shortness of breath, cardiac arrhythmias, dizziness, blood pressure fluctuations, myocardial infarction, stroke/transient ischemic attack, nausea, vomiting, allergic reaction, radiation exposure, metallic taste sensation and life-threatening complications (estimated to be 1 in 10,000)], benefits (risk stratification, diagnosing coronary artery disease, treatment guidance) and alternatives of a cardiac PET stress test were discussed in detail with Brian Atkins and Brian Atkins agrees to proceed.        Disposition: F/u with Dr. Okey Dupre or an APP in 3 months.   Medication Adjustments/Labs and Tests Ordered: Current medicines are reviewed at length with the patient today.  Concerns  regarding medicines are outlined above. Medication changes, Labs and Tests ordered today are summarized above and listed in the Patient Instructions accessible in Encounters.   Signed, Eula Listen, PA-C 12/05/2023 9:46 AM     [facility] [place] [address] [place], [place] [address] [phone]

## 2023-12-05 NOTE — Patient Instructions (Signed)
Medication Instructions:  Your physician recommends the following medication changes.  STOP TAKING: HCTZ  INCREASE: Carvedilol 25 mg twice daily   *If you need a refill on your cardiac medications before your next appointment, please call your pharmacy*   Lab Work: None ordered at this time    Testing/Procedures:   Please report to Radiology at [facility] Main Entrance, medical mall, 30 mins prior to your test.  [address]  [place], [place]  [phone]  How to Prepare for Your Cardiac PET/CT Stress Test:  Nothing to eat or drink, except water, 3 hours prior to arrival time.  NO caffeine/decaffeinated products, or chocolate 12 hours prior to arrival. (Please note decaffeinated beverages (teas/coffees) still contain caffeine).  If you have caffeine within 12 hours prior, the test will need to be rescheduled.  Medication instructions: Do not take erectile dysfunction medications for 72 hours prior to test (sildenafil, tadalafil) Do not take nitrates the day before or day of test Do not take tamsulosin the day before or morning of test Hold theophylline containing medications for 12 hours. Hold Dipyridamole 48 hours prior to the test.  You may take your remaining medications with water.  NO perfume, cologne or lotion on chest or abdomen area.  Total time is 1 to 2 hours; you may want to bring reading material for the waiting time.   In preparation for your appointment, medication and supplies will be purchased.  Appointment availability is limited, so if you need to cancel or reschedule, please call the Radiology Department at [phone] [facility]) OR [phone] [facility]) 24 hours in advance to avoid a cancellation fee of $100.00  What to Expect When you Arrive:  Once you arrive and check in for your appointment, you will be taken to a preparation room within the Radiology Department.  A technologist or Nurse will obtain your medical  history, verify that you are correctly prepped for the exam, and explain the procedure.  Afterwards, an IV will be started in your arm and electrodes will be placed on your skin for EKG monitoring during the stress portion of the exam. Then you will be escorted to the PET/CT scanner.  There, staff will get you positioned on the scanner and obtain a blood pressure and EKG.  During the exam, you will continue to be connected to the EKG and blood pressure machines.  A small, safe amount of a radioactive tracer will be injected in your IV to obtain a series of pictures of your heart along with an injection of a stress agent.    After your Exam:  It is recommended that you eat a meal and drink a caffeinated beverage to counter act any effects of the stress agent.  Drink plenty of fluids for the remainder of the day and urinate frequently for the first couple of hours after the exam.  Your doctor will inform you of your test results within 7-10 business days.  For more information and frequently asked questions, please visit our website: [email or web address]  For questions about your test or how to prepare for your test, please call: Cardiac Imaging Nurse Navigators Office: [phone]    Follow-Up: At [facility], you and your health needs are our priority.  As part of our continuing mission to provide you with exceptional heart care, we have created designated Provider Care Teams.  These Care Teams include your primary Cardiologist (physician) and Advanced Practice Providers (APPs -  Physician Assistants and Nurse Practitioners) who  all work together to provide you with the care you need, when you need it.  We recommend signing up for the patient portal called "MyChart".  Sign up information is provided on this After Visit Summary.  MyChart is used to connect with patients for Virtual Visits (Telemedicine).  Patients are able to view lab/test results, encounter notes, upcoming  appointments, etc.  Non-urgent messages can be sent to your provider as well.   To learn more about what you can do with MyChart, go to [email or web address].    Your next appointment:   3 month(s)  Provider:   You may see Yvonne Kendall, MD or one of the following Advanced Practice Providers on your designated Care Team:   Eula Listen, [place]

## 2023-12-12 ENCOUNTER — Other Ambulatory Visit (HOSPITAL_COMMUNITY): Payer: Self-pay

## 2023-12-12 ENCOUNTER — Telehealth: Payer: Self-pay | Admitting: Pharmacy Technician

## 2023-12-12 NOTE — Telephone Encounter (Signed)
Pharmacy Patient Advocate Encounter   Received notification from CoverMyMeds that prior authorization for repatha is required/requested.   Insurance verification completed.   The patient is insured through [facility] .   Per test claim: PA required; PA submitted to above mentioned insurance via CoverMyMeds Key/confirmation #/EOC [id number] Status is pending

## 2023-12-13 ENCOUNTER — Other Ambulatory Visit (HOSPITAL_COMMUNITY): Payer: Self-pay

## 2023-12-13 ENCOUNTER — Telehealth (HOSPITAL_BASED_OUTPATIENT_CLINIC_OR_DEPARTMENT_OTHER): Payer: Self-pay | Admitting: Pharmacy Technician

## 2023-12-13 NOTE — Telephone Encounter (Signed)
Pharmacy Patient Advocate Encounter   Received notification from CoverMyMeds that prior authorization for praluent is required/requested.   Insurance verification completed.   The patient is insured through [facility] .   Per test claim: PA required; PA submitted to above mentioned insurance via CoverMyMeds Key/confirmation #/EOC [id number] Status is pending

## 2023-12-13 NOTE — Telephone Encounter (Signed)
Pharmacy Patient Advocate Encounter  Received notification from [facility] that Prior Authorization for repatha has been DENIED.  See denial reason below. No denial letter attached in CMM. Will attach denial letter to Media tab once received.  Pa submitted for praluent   PA #/Case ID/Reference #: [id number]

## 2023-12-15 NOTE — Telephone Encounter (Signed)
Pharmacy Patient Advocate Encounter  Received notification from [facility] that Prior Authorization for praluent has been APPROVED from 11/29/23 to further notice   PA #/Case ID/Reference #: [id number]

## 2023-12-19 ENCOUNTER — Encounter (HOSPITAL_COMMUNITY): Payer: Self-pay

## 2023-12-22 ENCOUNTER — Ambulatory Visit
Admission: RE | Admit: 2023-12-22 | Discharge: 2023-12-22 | Disposition: A | Discharge status: Home / Self Care | Payer: Medicare Other | Source: Ambulatory Visit | Attending: Physician Assistant | Admitting: Physician Assistant

## 2023-12-22 DIAGNOSIS — I700 Atherosclerosis of aorta: Secondary | ICD-10-CM | POA: Insufficient documentation

## 2023-12-22 DIAGNOSIS — Z9081 Acquired absence of spleen: Secondary | ICD-10-CM | POA: Diagnosis not present

## 2023-12-22 DIAGNOSIS — I25118 Atherosclerotic heart disease of native coronary artery with other forms of angina pectoris: Secondary | ICD-10-CM | POA: Insufficient documentation

## 2023-12-22 DIAGNOSIS — R0609 Other forms of dyspnea: Secondary | ICD-10-CM | POA: Insufficient documentation

## 2023-12-22 LAB — NM PET CT CARDIAC PERFUSION MULTI W/ABSOLUTE BLOODFLOW
LV dias vol: 106 mL (ref 62–150)
LV sys vol: 32 mL
MBFR: 2.55
Nuc Rest EF: 65 %
Nuc Stress EF: 70 %
Peak HR: 68 {beats}/min
Rest HR: 59 {beats}/min
Rest MBF: 0.71 ml/g/min
Rest Nuclear Isotope Dose: 24.8 mCi
SRS: 0
SSS: 0
ST Depression (mm): 0 mm
Stress MBF: 1.81 ml/g/min
Stress Nuclear Isotope Dose: 25 mCi
TID: 0.95

## 2023-12-22 MED ORDER — REGADENOSON 0.4 MG/5ML IV SOLN
INTRAVENOUS | Status: AC
  Filled 2023-12-22: qty 5

## 2023-12-22 MED ORDER — RUBIDIUM RB82 GENERATOR (RUBYFILL)
25.0000 | PACK | Freq: Once | INTRAVENOUS | Status: AC
  Administered 2023-12-22: 24.78 via INTRAVENOUS

## 2023-12-22 MED ORDER — RUBIDIUM RB82 GENERATOR (RUBYFILL)
25.0000 | PACK | Freq: Once | INTRAVENOUS | Status: AC
  Administered 2023-12-22: 25 via INTRAVENOUS

## 2023-12-22 MED ORDER — REGADENOSON 0.4 MG/5ML IV SOLN
0.4000 mg | Freq: Once | INTRAVENOUS | Status: AC
  Administered 2023-12-22: 0.4 mg via INTRAVENOUS
  Filled 2023-12-22: qty 5

## 2023-12-22 NOTE — Progress Notes (Signed)
 Patient presents for a cardiac PET stress test and tolerated procedure without incident. Patient maintained acceptable vital signs throughout the test and was offered caffeine after test.  Patient ambulated out of department with a steady gait.

## 2023-12-30 MED ORDER — EZETIMIBE 10 MG PO TABS: 10.0000 mg | ORAL_TABLET | ORAL | 1 refills | Status: DC

## 2024-01-13 ENCOUNTER — Telehealth: Payer: Self-pay | Admitting: Physician Assistant

## 2024-01-13 NOTE — Telephone Encounter (Signed)
   Pre-operative Risk Assessment    Patient Name: Brian Atkins  DOB: [birth year] MRN: [id number]   Date of last office visit: 12/05/23 Date of next office visit: 03/06/24   Request for Surgical Clearance    Procedure:  Sleep Endoscopy  Date of Surgery:  Clearance 02/15/24                                Surgeon:  Dr. Madelon Lips Surgeon's Group or Practice Name:  [facility] Phone number:  [phone] Fax number:  [phone]   Type of Clearance Requested:   - Medical    Type of Anesthesia:  Not Indicated   Additional requests/questions:    Signed, Narda Amber   01/13/2024, 4:13 PM

## 2024-01-13 NOTE — Telephone Encounter (Signed)
68 year old male with history of CAD by LHC in 11/2017 and coronary CTA in 11/2019, marginal zone B-cell lymphoma, restrictive lung disease, SBO requiring emergent surgery, PE with prior DVT/PE, HTN, HLD with statin intolerance, peripheral neuropathy, sleep apnea on CPAP, and GERD status post Nissen fundoplication who was most recently seen by myself on 12/05/2023.  At that time, he was without symptoms of angina or cardiac decompensation.  He did note some exertional fatigue and dyspnea.  Subsequent myocardial PET/CT on 12/22/2023 showed no evidence of ischemia or infarction and was overall low risk with preserved LV systolic function.  From a cardiac perspective, as long as he has no new symptoms of angina or cardiac decompensation he would be acceptable risk for noncardiac surgery.

## 2024-01-15 ENCOUNTER — Other Ambulatory Visit: Payer: Self-pay | Admitting: Internal Medicine

## 2024-01-16 NOTE — Telephone Encounter (Signed)
   Patient Name: Brian Atkins  DOB: March 07, 1956 MRN: [id number]  Primary Cardiologist: Yvonne Kendall, MD  Chart reviewed as part of pre-operative protocol coverage. Case was reviewed with last provider that saw him Eula Listen, PA-C). Stress test since last office visit was low risk. Pt is felt to be acceptable risk for his procedure and may proceed. Please see notes from Mr. Shea Evans.  I will route this recommendation to the requesting party via Epic fax function and remove from pre-op pool.  Please call with questions.  Tereso Newcomer, PA-C 01/16/2024, 4:15 PM

## 2024-01-16 NOTE — Telephone Encounter (Signed)
Notes faxed to surgeon. This phone note will be removed from the preop pool. Tereso Newcomer, PA-C  01/16/2024 4:18 PM

## 2024-01-17 ENCOUNTER — Other Ambulatory Visit: Payer: Self-pay | Admitting: Neurology

## 2024-01-17 ENCOUNTER — Telehealth: Payer: Self-pay

## 2024-01-17 DIAGNOSIS — M545 Low back pain, unspecified: Secondary | ICD-10-CM

## 2024-01-17 NOTE — Telephone Encounter (Signed)
Copied from CRM [id number]. Topic: Clinical - Medication Question >> Jan 17, 2024  8:08 AM Marica Otter wrote: Reason for CRM: Patient has an appointment scheduled with provider for medication refills on February 18th, patient is asking if he can have a months worth of medication sent to the pharmacy on file to hold him over until his upcoming appointment. losartan (COZAAR) 100 MG tablet Eszopiclone 3 MG TABS zolpidem (AMBIEN CR) 6.25 MG CR tablet. Please reach out to patient to verify, thanks.  [place] [phone]

## 2024-01-17 NOTE — Telephone Encounter (Signed)
Losartan has been refilled. Brian Atkins has already been sent to Dr. [name] in a refill request encounter. Zolpidem still has refills remaining and pt just needs to call pharmacy for refill. Pt is aware and gave a verbal understanding.

## 2024-01-17 NOTE — Telephone Encounter (Signed)
Refilled: 06/14/2023 Last OV: 01/23/2023 Next OV: 02/07/2024

## 2024-01-19 ENCOUNTER — Other Ambulatory Visit: Payer: Medicare Other

## 2024-01-20 ENCOUNTER — Other Ambulatory Visit: Payer: Self-pay | Admitting: Emergency Medicine

## 2024-01-20 DIAGNOSIS — Z79899 Other long term (current) drug therapy: Secondary | ICD-10-CM

## 2024-01-20 NOTE — Addendum Note (Signed)
Addended by: Ursula Alert on: 01/20/2024 05:35 PM   Modules accepted: Orders

## 2024-01-25 ENCOUNTER — Other Ambulatory Visit: Payer: Self-pay | Admitting: Emergency Medicine

## 2024-01-25 ENCOUNTER — Ambulatory Visit: Payer: Medicare Other

## 2024-01-30 ENCOUNTER — Telehealth: Payer: Self-pay | Admitting: Pharmacy Technician

## 2024-01-30 ENCOUNTER — Other Ambulatory Visit (HOSPITAL_COMMUNITY): Payer: Self-pay

## 2024-01-30 NOTE — Telephone Encounter (Signed)
 Pharmacy Patient Advocate Encounter   Received notification from CoverMyMeds that prior authorization for Repatha  is required/requested.   Insurance verification completed.   The patient is insured through  [facility]  .   Per test claim: PA required; PA started via CoverMyMeds. KEY [id number] . Please see clinical question(s) below that I am not finding the answer to in her chart and advise.   Repatha  is not on formulary- Praluent  preferred and does not need PA .  Is patient taking Repatha  or Praluent ?

## 2024-02-02 ENCOUNTER — Other Ambulatory Visit
Admission: RE | Admit: 2024-02-02 | Discharge: 2024-02-02 | Disposition: A | Discharge status: Home / Self Care | Payer: Medicare Other | Source: Ambulatory Visit | Attending: Neurology | Admitting: Neurology

## 2024-02-02 ENCOUNTER — Ambulatory Visit
Admission: RE | Admit: 2024-02-02 | Discharge: 2024-02-02 | Disposition: A | Discharge status: Home / Self Care | Payer: Medicare Other | Source: Ambulatory Visit | Attending: Neurology | Admitting: Neurology

## 2024-02-02 DIAGNOSIS — G8929 Other chronic pain: Secondary | ICD-10-CM | POA: Diagnosis present

## 2024-02-02 DIAGNOSIS — M545 Low back pain, unspecified: Secondary | ICD-10-CM | POA: Insufficient documentation

## 2024-02-02 DIAGNOSIS — Z79899 Other long term (current) drug therapy: Secondary | ICD-10-CM | POA: Insufficient documentation

## 2024-02-02 LAB — LIPID PANEL
Cholesterol: 194 mg/dL (ref 0–200)
HDL: 36 mg/dL — ABNORMAL LOW (ref >40)
LDL Cholesterol: 123 mg/dL — ABNORMAL HIGH (ref 0–99)
Total CHOL/HDL Ratio: 5.4 {ratio}
Triglycerides: 177 mg/dL — ABNORMAL HIGH (ref <150)
VLDL: 35 mg/dL (ref 0–40)

## 2024-02-02 LAB — VITAMIN B12: Vitamin B-12: 395 pg/mL (ref 180–914)

## 2024-02-02 LAB — HEPATIC FUNCTION PANEL
ALT: 16 U/L (ref 0–44)
AST: 17 U/L (ref 15–41)
Albumin: 3.9 g/dL (ref 3.5–5.0)
Alkaline Phosphatase: 63 U/L (ref 38–126)
Bilirubin, Direct: <0.1 mg/dL (ref 0.0–0.2)
Total Bilirubin: 0.5 mg/dL (ref 0.0–1.2)
Total Protein: 7.2 g/dL (ref 6.5–8.1)

## 2024-02-03 ENCOUNTER — Other Ambulatory Visit: Payer: Self-pay | Admitting: Internal Medicine

## 2024-02-07 ENCOUNTER — Encounter: Payer: Self-pay | Admitting: Internal Medicine

## 2024-02-07 ENCOUNTER — Ambulatory Visit (INDEPENDENT_AMBULATORY_CARE_PROVIDER_SITE_OTHER): Payer: Medicare Other | Admitting: Internal Medicine

## 2024-02-07 VITALS — BP 128/80 | HR 72 | Ht 74.0 in | Wt 217.0 lb

## 2024-02-07 DIAGNOSIS — I700 Atherosclerosis of aorta: Secondary | ICD-10-CM

## 2024-02-07 DIAGNOSIS — G4452 New daily persistent headache (NDPH): Secondary | ICD-10-CM

## 2024-02-07 DIAGNOSIS — I25119 Atherosclerotic heart disease of native coronary artery with unspecified angina pectoris: Secondary | ICD-10-CM

## 2024-02-07 DIAGNOSIS — E782 Mixed hyperlipidemia: Secondary | ICD-10-CM | POA: Diagnosis not present

## 2024-02-07 DIAGNOSIS — R7303 Prediabetes: Secondary | ICD-10-CM | POA: Diagnosis not present

## 2024-02-07 DIAGNOSIS — R200 Anesthesia of skin: Secondary | ICD-10-CM

## 2024-02-07 DIAGNOSIS — G4701 Insomnia due to medical condition: Secondary | ICD-10-CM

## 2024-02-07 DIAGNOSIS — Z125 Encounter for screening for malignant neoplasm of prostate: Secondary | ICD-10-CM | POA: Diagnosis not present

## 2024-02-07 DIAGNOSIS — K638219 Small intestinal bacterial overgrowth, unspecified: Secondary | ICD-10-CM

## 2024-02-07 DIAGNOSIS — Z0001 Encounter for general adult medical examination with abnormal findings: Secondary | ICD-10-CM

## 2024-02-07 DIAGNOSIS — R202 Paresthesia of skin: Secondary | ICD-10-CM

## 2024-02-07 LAB — HEMOGLOBIN A1C: Hgb A1c MFr Bld: 5.9 % (ref 4.6–6.5)

## 2024-02-07 LAB — PSA, MEDICARE: PSA: 2.93 ng/mL (ref 0.10–4.00)

## 2024-02-07 LAB — TSH: TSH: 1.42 u[IU]/mL (ref 0.35–5.50)

## 2024-02-07 MED ORDER — CYANOCOBALAMIN 1000 MCG/ML IJ SOLN: INTRAMUSCULAR | 0 refills | Status: AC

## 2024-02-07 MED ORDER — ALPRAZOLAM 0.5 MG PO TABS: 0.5000 mg | ORAL_TABLET | Freq: Two times a day (BID) | ORAL | 0 refills | Status: DC | PRN

## 2024-02-07 NOTE — Assessment & Plan Note (Signed)
Recent sick contact.  Likely viral etiology. Limited exam given nature of visit.  In no acute distress. History of SBO Check labs and stool  Continue to remain hydrated Strict return precautions provided Follow up with PCP

## 2024-02-07 NOTE — Assessment & Plan Note (Signed)
 Secondary to OSA. Alternating between use of lunesta and  ambien CR but not tolerating CPAP .  Considering INSPIRE surgery.

## 2024-02-07 NOTE — Assessment & Plan Note (Signed)
He is seeing a neurologist and has been diagnosed with neuropathy which I think is in part contributing to his toe symptoms.

## 2024-02-07 NOTE — Assessment & Plan Note (Signed)
 Now under treatment with mestinon by [facility]

## 2024-02-07 NOTE — Assessment & Plan Note (Addendum)
 Noninvasive Workup has noted 3 vessel disease

## 2024-02-07 NOTE — Assessment & Plan Note (Signed)
 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

## 2024-02-07 NOTE — Assessment & Plan Note (Signed)
 His  random glucose was  not  elevated but his A1c suggests he is at risk for developing diabetes.  I recommended that he follow a low glycemic index diet and increase his protein intake .  Continue semi annual surveillance .  Lab Results  Component Value Date   HGBA1C 5.9 02/07/2024

## 2024-02-07 NOTE — Assessment & Plan Note (Signed)
age appropriate education and counseling updated, referrals for preventative services and immunizations addressed, dietary and smoking counseling addressed, most recent labs reviewed.  I have personally reviewed and have noted:   1) the patient's medical and social history 2) The pt's use of alcohol, tobacco, and illicit drugs 3) The patient's current medications and supplements 4) Functional ability including ADL's, fall risk, home safety risk, hearing and visual impairment 5) Diet and physical activities 6) Evidence for depression or mood disorder 7) The patient's height, weight, and BMI have been recorded in the chart 8) I have ordered and reviewed a 12 lead EKG and find that there are no acute changes and patient is in sinus rhythm.     I have made referrals, and provided counseling and education based on review of the above  

## 2024-02-07 NOTE — Progress Notes (Deleted)
 Subjective:  Patient ID: Brian Atkins, male    DOB: 19-Jan-1956  Age: 68 y.o. MRN: [id number]  CC: There were no encounter diagnoses.   HPI JAFET WISSING presents for  Chief Complaint  Patient presents with   Medical Management of Chronic Issues   Nadine Counts was last seen one year ago for CPE .  Since then he has had the following medical issues/interventions:  1) currently starting treatment for  SIBO and IMO (intestinal methanogen overgrowth) with a trial of mestinon by [facility] (off label use of ACetylcholinesterase inhibitor) that is being titrated upward.  His symptoms have been attributed to chemotherapy received previously in treatment for MALT Lymphoma.   2) s/p exploratory laparotomy. 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 Threatened but viable bowel, no resection required. Lysis of adhesions performed. Postoperatively, patient was NPO and decompressed with NGT   3) has  been seeing Dr. Sherryll Burger , neurology for intractable migraine and occipital neuralgia  .  Zonisagram and  Occipital nerve block tried.    planning to have another series.    3) left sciatica recurring  with prolonged standing  MRI lumbar spine  done recently at [facility],  not read yet.  4) HLD;  3 vessel disease noted noninvasively.   Taking zetia only  due to statin myalgias and failure of cardiology to attain PA for PCSK9 inhibitors     5) left shoulder rotator cuff surgery since last visit.   Shoulder pain is more tolerable,  less ROM than anticipated,  right shoulder  problematic but deferred   6) OSA;  trying to tolerate CPAP ,  referred by [facility] to [facility] specialist for consideration of hypoglossal nerve stimulation (INSPIRE) .  He is a side sleeper by preference.  Has tried all masks available  and has not tolerated any for the full night.   He has horrific nightmares when he sleeps or naps without the CPAP .  Currently alternating between [place]   7) Prostate Cancer screening:  no recent screening   Lab Results  Component Value Date   PSA1 2.8 06/07/2022   PSA 4.76 (H) 04/07/2022   PSA 3.11 10/24/2020   PSA 1.92 03/02/2013       Outpatient Medications Prior to Visit  Medication Sig Dispense Refill   carvedilol (COREG) 25 MG tablet Take 1 tablet (25 mg total) by mouth 2 (two) times daily with a meal. 180 tablet 3   cyanocobalamin (VITAMIN B12) 1000 MCG/ML injection INJECT INTO THE MUSCLE EVERY 30 DAYS 10 mL 0   ELIQUIS 5 MG TABS tablet TAKE ONE TABLET BY MOUTH TWICE A DAY 60 tablet 5   Eszopiclone 3 MG TABS TAKE ONE TABLET BY MOUTH AT BEDTIME ALTERNATING WITH AMBIEN 15 tablet 2   ezetimibe (ZETIA) 10 MG tablet Take 1 tablet (10 mg total) by mouth every morning. 30 tablet 1   famotidine (PEPCID) 20 MG tablet Take 20 mg by mouth 2 (two) times daily.     furosemide (LASIX) 40 MG tablet Take 40 mg by mouth as needed.     losartan (COZAAR) 100 MG tablet TAKE ONE TABLET BY MOUTH ONE TIME DAILY 90 tablet 1   Multiple Vitamins-Minerals (MULTIVITAMIN WITH MINERALS) tablet Take 1 tablet by mouth daily.     nitroGLYCERIN (NITROSTAT) 0.4 MG SL tablet Place 1 tablet (0.4 mg total) under the tongue every  5 (five) minutes as needed for chest pain. Maximum of 3 doses. 25 tablet 3   omeprazole (PRILOSEC) 40 MG capsule Take 40 mg by mouth every morning.     ondansetron (ZOFRAN) 4 MG tablet Take 1 tablet (4 mg total) by mouth every 8 (eight) hours as needed. 20 tablet 5   ONETOUCH VERIO test strip USE TO CHECK BLOOD SUGAR AS NEEDED 100 strip 2   Prucalopride Succinate (MOTEGRITY) 2 MG TABS Take 2 mg by mouth daily as needed (constipation).     pyridostigmine (MESTINON) 60 MG tablet Take 60 mg by mouth 3 (three) times daily.     rOPINIRole (REQUIP) 0.25 MG tablet Take 0.25 mg by mouth in the morning and at bedtime.      Syringe/Needle, Disp, (SYRINGE 3CC/25GX1") 25G X 1" 3 ML MISC Use for b12  injections 50 each 0   tiZANidine (ZANAFLEX) 4 MG tablet TAKE ONE-HALF TO ONE TABLET BY MOUTH AT BEDTIME AS NEEDED FOR MUSCLE SPASM 30 tablet 2   triamcinolone (NASACORT) 55 MCG/ACT AERO nasal inhaler Place 2 sprays into the nose every 8 (eight) hours as needed.     zolpidem (AMBIEN CR) 6.25 MG CR tablet TAKE TWO TABLETS BY MOUTH AT BEDTIME AS NEEDED FOR SLEEP 60 tablet 5   albuterol (VENTOLIN HFA) 108 (90 Base) MCG/ACT inhaler Inhale 2 puffs into the lungs every 6 (six) hours as needed for wheezing or shortness of breath. (Patient not taking: Reported on 12/05/2023) 8 g 0   Alirocumab (PRALUENT) 150 MG/ML SOAJ Inject 1 mL (150 mg total) into the skin every 14 (fourteen) days. (Patient not taking: Reported on 11/23/2023) 2 mL 2   clindamycin (CLEOCIN-T) 1 % lotion Apply twice daily to affected rash for 2 weeks. (Patient not taking: Reported on 02/07/2024) 60 mL 0   DULoxetine (CYMBALTA) 20 MG capsule Take 20 mg by mouth as needed. (Patient not taking: Reported on 11/23/2023)     Evolocumab (REPATHA SURECLICK) 140 MG/ML SOAJ Inject 140 mg into the skin every 14 (fourteen) days. (Patient not taking: Reported on 02/07/2024) 6 mL 3   ondansetron (ZOFRAN) 4 MG tablet Take 1 tablet (4 mg total) by mouth every 8 (eight) hours as needed for nausea or vomiting. 20 tablet 0   No facility-administered medications prior to visit.    Review of Systems;  Patient denies headache, fevers, malaise, unintentional weight loss, skin rash, eye pain, sinus congestion and sinus pain, sore throat, dysphagia,  hemoptysis , cough, dyspnea, wheezing, chest pain, palpitations, orthopnea, edema, abdominal pain, nausea, melena, diarrhea, constipation, flank pain, dysuria, hematuria, urinary  Frequency, nocturia, numbness, tingling, seizures,  Focal weakness, Loss of consciousness,  Tremor, insomnia, depression, anxiety, and suicidal ideation.      Objective:  BP 128/80   Pulse 72   Ht 6\' 2"  (1.88 m)   Wt 217 lb (98.4 kg)    SpO2 95%   BMI 27.86 kg/m   BP Readings from Last 3 Encounters:  02/07/24 128/80  12/22/23 122/68  12/05/23 130/77    Wt Readings from Last 3 Encounters:  02/07/24 217 lb (98.4 kg)  12/22/23 211 lb (95.7 kg)  12/05/23 217 lb 3.2 oz (98.5 kg)    Physical Exam  Lab Results  Component Value Date   HGBA1C 6.0 01/24/2023   HGBA1C 6.0 04/07/2022   HGBA1C 6.0 09/23/2020    Lab Results  Component Value Date   CREATININE 1.01 06/21/2023   CREATININE 1.02 01/24/2023   CREATININE 1.01 08/19/2022  Lab Results  Component Value Date   WBC 10.2 06/21/2023   HGB 14.9 06/21/2023   HCT 45.0 06/21/2023   PLT 363.0 06/21/2023   GLUCOSE 76 06/21/2023   CHOL 194 02/02/2024   TRIG 177 (H) 02/02/2024   HDL 36 (L) 02/02/2024   LDLDIRECT 173.0 01/24/2023   LDLCALC 123 (H) 02/02/2024   ALT 16 02/02/2024   AST 17 02/02/2024   NA 139 06/21/2023   K 4.0 06/21/2023   CL 102 06/21/2023   CREATININE 1.01 06/21/2023   BUN 15 06/21/2023   CO2 30 06/21/2023   TSH 1.75 01/24/2023   PSA 4.76 (H) 04/07/2022   INR 1.0 03/28/2022   HGBA1C 6.0 01/24/2023   MICROALBUR <0.7 01/24/2023    No results found.  Assessment & Plan:  .There are no diagnoses linked to this encounter.   I provided 30 minutes of face-to-face time during this encounter reviewing patient's last visit with me, patient's  most recent visit with cardiology,  nephrology,  and neurology,  recent surgical and non surgical procedures, previous  labs and imaging studies, counseling on currently addressed issues,  and post visit ordering to diagnostics and therapeutics .   Follow-up: No follow-ups on file.   Sherlene Shams, MD

## 2024-02-07 NOTE — Progress Notes (Signed)
 Patient ID: Brian Atkins, male    DOB: [birth year]  Age: 68 y.o. MRN: [id number]  The patient is here for annual preventive  examination and management of other chronic and acute problems.   The risk factors are reflected in the social history.  The roster of all physicians providing medical care to patient - is listed in the Snapshot section of the chart.  Activities of daily living:  The patient is 100% independent in all ADLs: dressing, toileting, feeding as well as independent mobility  Home safety : The patient has smoke detectors in the home. They wear seatbelts.  There are no firearms at home. There is no violence in the home.   There is no risks for hepatitis, STDs or HIV. There is no   history of blood transfusion. They have no travel history to infectious disease endemic areas of the world.  The patient has seen their dentist in the last six month. They have seen their eye doctor in the last year. They admit to slight hearing difficulty with regard to whispered voices and some television programs.  They have deferred audiologic testing in the last year.  They do not  have excessive sun exposure. Discussed the need for sun protection: hats, long sleeves and use of sunscreen if there is significant sun exposure.   Diet: the importance of a healthy diet is discussed. They do have a healthy diet.  The benefits of regular aerobic exercise were discussed.  He does not exercise regularly .   Depression screen: there are no signs or vegative symptoms of depression- irritability, change in appetite, anhedonia, sadness/tearfullness.  Cognitive assessment: the patient manages all their financial and personal affairs and is actively engaged. They could relate day,date,year and events; recalled 2/3 objects at 3 minutes; performed clock-face test normally.  The following portions of the patient's history were reviewed and updated as appropriate: allergies, current medications, past family history,  past medical history,  past surgical history, past social history  and problem list.  Visual acuity was not assessed per patient preference since she has regular follow up with her ophthalmologist. Hearing and body mass index were assessed and reviewed.   During the course of the visit the patient was educated and counseled about appropriate screening and preventive services including : fall prevention , diabetes screening, nutrition counseling, colorectal cancer screening, and recommended immunizations.    CC: The primary encounter diagnosis was Prostate cancer screening. Diagnoses of Prediabetes, Mixed hyperlipidemia, Aortic atherosclerosis (HCC), Coronary artery disease involving native coronary artery of native heart with angina pectoris (HCC), Insomnia due to medical condition, Numbness and tingling of upper and lower extremities of both sides, Encounter for general adult medical examination with abnormal findings, New persistent daily headache, and Small intestinal bacterial overgrowth were also pertinent to this visit.  Brian Atkins was last seen one year ago for CPE .  Since then he has had the following medical issues/interventions:  1) currently starting treatment for  SIBO and IMO (intestinal methanogen overgrowth) with a trial of mestinon by [facility] (off label use of ACetylcholinesterase inhibitor) that is being titrated upward.  His symptoms have been attributed to chemotherapy received previously in treatment for MALT Lymphoma.   2) s/p exploratory laparotomy. 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 Threatened but viable bowel, no resection required. Lysis of adhesions performed. Postoperatively, patient was NPO and decompressed with NGT  3) has  been seeing Dr. Sherryll Burger , neurology for intractable migraine and occipital neuralgia  .  Zonisagram and  Occipital nerve block tried.    planning to  have another series.    3) left sciatica recurring  with prolonged standing  MRI lumbar spine  done recently at [facility],  not read yet.  4) HLD;  3 vessel disease noted noninvasively.   Taking zetia only  due to statin myalgias and failure of cardiology to attain PA for PCSK9 inhibitors     5) left shoulder mini open rotator cuff repair by Doloris Hall last summer.  Right shoulder pain secondary to impingement with full thickness rotator cuff tear and biceps tendinosis.   Left shoulder pain is more tolerable,  less ROM than anticipated,  right shoulder  problematic but surgery deferred   6) OSA;  trying to tolerate CPAP ,  referred by [facility] to [facility] specialist for consideration of hypoglossal nerve stimulation (INSPIRE) .  He is a side sleeper by preference.  Has tried all masks available  and has not tolerated any for the full night.   He has horrific nightmares when he sleeps or naps without the CPAP . 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), Family history of adverse reaction to anesthesia, GERD (gastroesophageal reflux disease), Hemorrhoids, Hiatal hernia, History of 2019 novel coronavirus disease (COVID-19) (12/24/2019), History of 2019 novel coronavirus disease (COVID-19) (12/24/2019), History of kidney stones, splenectomy, Idiopathic angioedema, Insomnia, Labile hypertension, Left rotator cuff tear, Long term current use of anticoagulant, Medial epicondylitis of right elbow, Meralgia paresthetica of right side, Microscopic hematuria, Myalgia due to statin, Ocular migraine, OSA on CPAP, Pneumonia, Prediabetes, PSVT (paroxysmal supraventricular tachycardia)  (HCC), Pulmonary emboli (HCC) (03/28/2022), RLS (restless legs syndrome), and Splenic vein thrombosis.   He has a past surgical history that includes nissen funduplication (2009); Splenectomy; LEFT HEART CATH AND CORONARY ANGIOGRAPHY (Left, 12/07/2017); CORONARY PRESSURE/FFR STUDY (N/A, 12/07/2017); Septoplasty (Bilateral, 08/23/2019); Turbinate reduction (Bilateral, 08/23/2019); Colonoscopy; Flexible bronchoscopy (N/A, 08/21/2021); Bronchial washings (N/A, 08/21/2021); PULMONARY THROMBECTOMY (N/A, 03/29/2022); Shoulder arthroscopy with open rotator cuff repair and distal clavicle acrominectomy (Left, 02/22/2023); and Shoulder arthroscopy (Left, 02/22/2023).   His family history includes Aortic aneurysm (age of onset: 19) in his father; COPD in his mother; Cancer in his mother; Coronary artery disease (age of onset: 64) in his father; Hyperlipidemia in his father; Hypertension in his mother; Lung cancer in his mother; Stomach cancer in his maternal grandfather.He reports that he has never smoked. He has been exposed to tobacco smoke. He has never used smokeless tobacco. He reports that he does not drink alcohol and does not use drugs.  Outpatient Medications Prior to Visit  Medication Sig Dispense Refill   carvedilol (COREG) 25 MG tablet Take 1 tablet (25 mg total) by mouth 2 (two) times daily with a meal. 180 tablet 3   ELIQUIS 5 MG TABS tablet TAKE ONE TABLET BY MOUTH TWICE A DAY 60 tablet 5   Eszopiclone 3 MG TABS TAKE ONE TABLET BY MOUTH AT BEDTIME ALTERNATING WITH AMBIEN 15 tablet 2   ezetimibe (ZETIA) 10 MG tablet Take 1 tablet (10 mg total) by mouth every morning. 30 tablet 1   famotidine (PEPCID) 20 MG tablet Take 20 mg by mouth  2 (two) times daily.     furosemide (LASIX) 40 MG tablet Take 40 mg by mouth as needed.     losartan (COZAAR) 100 MG tablet TAKE ONE TABLET BY MOUTH ONE TIME DAILY 90 tablet 1   Multiple Vitamins-Minerals (MULTIVITAMIN WITH MINERALS) tablet Take 1 tablet by mouth daily.      nitroGLYCERIN (NITROSTAT) 0.4 MG SL tablet Place 1 tablet (0.4 mg total) under the tongue every 5 (five) minutes as needed for chest pain. Maximum of 3 doses. 25 tablet 3   omeprazole (PRILOSEC) 40 MG capsule Take 40 mg by mouth every morning.     ondansetron (ZOFRAN) 4 MG tablet Take 1 tablet (4 mg total) by mouth every 8 (eight) hours as needed. 20 tablet 5   ONETOUCH VERIO test strip USE TO CHECK BLOOD SUGAR AS NEEDED 100 strip 2   Prucalopride Succinate (MOTEGRITY) 2 MG TABS Take 2 mg by mouth daily as needed (constipation).     pyridostigmine (MESTINON) 60 MG tablet Take 60 mg by mouth 3 (three) times daily.     rOPINIRole (REQUIP) 0.25 MG tablet Take 0.25 mg by mouth in the morning and at bedtime.      Syringe/Needle, Disp, (SYRINGE 3CC/25GX1") 25G X 1" 3 ML MISC Use for b12 injections 50 each 0   tiZANidine (ZANAFLEX) 4 MG tablet TAKE ONE-HALF TO ONE TABLET BY MOUTH AT BEDTIME AS NEEDED FOR MUSCLE SPASM 30 tablet 2   triamcinolone (NASACORT) 55 MCG/ACT AERO nasal inhaler Place 2 sprays into the nose every 8 (eight) hours as needed.     zolpidem (AMBIEN CR) 6.25 MG CR tablet TAKE TWO TABLETS BY MOUTH AT BEDTIME AS NEEDED FOR SLEEP 60 tablet 5   cyanocobalamin (VITAMIN B12) 1000 MCG/ML injection INJECT INTO THE MUSCLE EVERY 30 DAYS 10 mL 0   albuterol (VENTOLIN HFA) 108 (90 Base) MCG/ACT inhaler Inhale 2 puffs into the lungs every 6 (six) hours as needed for wheezing or shortness of breath. (Patient not taking: Reported on 12/05/2023) 8 g 0   Alirocumab (PRALUENT) 150 MG/ML SOAJ Inject 1 mL (150 mg total) into the skin every 14 (fourteen) days. (Patient not taking: Reported on 11/23/2023) 2 mL 2   clindamycin (CLEOCIN-T) 1 % lotion Apply twice daily to affected rash for 2 weeks. (Patient not taking: Reported on 02/07/2024) 60 mL 0   DULoxetine (CYMBALTA) 20 MG capsule Take 20 mg by mouth as needed. (Patient not taking: Reported on 11/23/2023)     Evolocumab (REPATHA SURECLICK) 140 MG/ML SOAJ  Inject 140 mg into the skin every 14 (fourteen) days. (Patient not taking: Reported on 02/07/2024) 6 mL 3   ondansetron (ZOFRAN) 4 MG tablet Take 1 tablet (4 mg total) by mouth every 8 (eight) hours as needed for nausea or vomiting. 20 tablet 0   No facility-administered medications prior to visit.    Review of Systems  Patient denies , fevers, malaise, unintentional weight loss, skin rash, eye pain, sinus congestion and sinus pain, sore throat, dysphagia,  hemoptysis , cough, dyspnea, wheezing, chest pain, palpitations, orthopnea, edema,, melena, , flank pain, dysuria, hematuria, urinary  Frequency, nocturia, numbness, tingling, seizures,  Focal weakness, Loss of consciousness,  Tremor, insomnia, depression, anxiety, and suicidal ideation.     Objective:  BP 128/80   Pulse 72   Ht 6\' 2"  (1.88 m)   Wt 217 lb (98.4 kg)   SpO2 95%   BMI 27.86 kg/m   Physical Exam Vitals reviewed.  Constitutional:  General: He is not in acute distress.    Appearance: Normal appearance. He is normal weight. He is not ill-appearing, toxic-appearing or diaphoretic.  HENT:     Head: Normocephalic and atraumatic.     Right Ear: Tympanic membrane, ear canal and external ear normal. There is no impacted cerumen.     Left Ear: Tympanic membrane, ear canal and external ear normal. There is no impacted cerumen.     Nose: Nose normal.     Mouth/Throat:     Mouth: Mucous membranes are moist.     Pharynx: Oropharynx is clear.  Eyes:     General: No scleral icterus.       Right eye: No discharge.        Left eye: No discharge.     Conjunctiva/sclera: Conjunctivae normal.  Neck:     Thyroid: No thyromegaly.     Vascular: No carotid bruit or JVD.  Cardiovascular:     Rate and Rhythm: Normal rate and regular rhythm.     Heart sounds: Normal heart sounds.  Pulmonary:     Effort: Pulmonary effort is normal. No respiratory distress.     Breath sounds: Normal breath sounds.  Abdominal:     General: Bowel  sounds are normal.     Palpations: Abdomen is soft. There is no mass.     Tenderness: There is no abdominal tenderness. There is no guarding or rebound.  Musculoskeletal:        General: Normal range of motion.     Cervical back: Normal range of motion and neck supple.  Lymphadenopathy:     Cervical: No cervical adenopathy.  Skin:    General: Skin is warm and dry.  Neurological:     General: No focal deficit present.     Mental Status: He is alert and oriented to person, place, and time. Mental status is at baseline.  Psychiatric:        Mood and Affect: Mood normal.        Behavior: Behavior normal.        Thought Content: Thought content normal.        Judgment: Judgment normal.        Assessment & Plan:  Prostate cancer screening -     PSA, Medicare  Prediabetes Assessment & Plan: His  random glucose was  not  elevated but his A1c suggests he is at risk for developing diabetes.  I recommended that he follow a low glycemic index diet and increase his protein intake .  Continue semi annual surveillance .  Lab Results  Component Value Date   HGBA1C 5.9 02/07/2024       Orders: -     Hemoglobin A1c  Mixed hyperlipidemia -     TSH  Aortic atherosclerosis (HCC) Assessment & Plan: Reviewed findings of prior CT scan today. As well as recent myoview noting 3 vessel disease. Currently address with zetia only  due to statin myalgias    Coronary artery disease involving native coronary artery of native heart with angina pectoris [facility]) Assessment & Plan: Noninvasive Workup has noted 3 vessel disease    Insomnia due to medical condition Assessment & Plan: Secondary to OSA. Alternating between use of lunesta and  ambien CR but not tolerating CPAP .  Considering INSPIRE surgery.    Numbness and tingling of upper and lower extremities of both sides Assessment & Plan: He is seeing a neurologist and has been diagnosed with neuropathy which I think is in part  contributing  to his toe symptoms.   Encounter for general adult medical examination with abnormal findings Assessment & Plan: age appropriate education and counseling updated, referrals for preventative services and immunizations addressed, dietary and smoking counseling addressed, most recent labs reviewed.  I have personally reviewed and have noted:   1) the patient's medical and social history 2) The pt's use of alcohol, tobacco, and illicit drugs 3) The patient's current medications and supplements 4) Functional ability including ADL's, fall risk, home safety risk, hearing and visual impairment 5) Diet and physical activities 6) Evidence for depression or mood disorder 7) The patient's height, weight, and BMI have been recorded in the chart 8) I have ordered and reviewed a 12 lead EKG and find that there are no acute changes and patient is in sinus rhythm.     I have made referrals, and provided counseling and education based on review of the above    New persistent daily headache Assessment & Plan: Migraine with aura along with occipital neuralgia diagnosed by Dr Sherryll Burger ; treatment has included occipital nerve blocks and zonisamide.    Small intestinal bacterial overgrowth Assessment & Plan: Now under treatment with mestinon by [facility]   Other orders -     Cyanocobalamin; INJECT INTO THE MUSCLE EVERY 30 DAYS  Dispense: 10 mL; Refill: 0 -     ALPRAZolam; Take 1 tablet (0.5 mg total) by mouth 2 (two) times daily as needed for anxiety.  Dispense: 60 tablet; Refill: 0      I provided 48 minutes of  face-to-face time during this encounter reviewing patient's  most recent  GI, neurologic, cardiologic and orthopedic problems and past surgeries,  recent labs and imaging studies, providing counseling on the above mentioned problems , and coordination  of care .   Follow-up: Return in about 6 months (around 08/06/2024).   Sherlene Shams, MD

## 2024-02-07 NOTE — Assessment & Plan Note (Signed)
 Reviewed findings of prior CT scan today. As well as recent myoview noting 3 vessel disease. Currently address with zetia only  due to statin myalgias

## 2024-02-07 NOTE — Patient Instructions (Addendum)
 Trial of alprazolam INSTEAD OF AMBIEN AND LUNESTA

## 2024-02-08 ENCOUNTER — Other Ambulatory Visit: Payer: Self-pay | Admitting: Emergency Medicine

## 2024-02-08 MED ORDER — PRALUENT 150 MG/ML ~~LOC~~ SOAJ: 150.0000 mg | SUBCUTANEOUS | 2 refills | Status: DC

## 2024-02-09 ENCOUNTER — Encounter: Payer: Self-pay | Admitting: Internal Medicine

## 2024-02-15 ENCOUNTER — Encounter: Payer: Self-pay | Admitting: Internal Medicine

## 2024-02-17 ENCOUNTER — Other Ambulatory Visit: Payer: Self-pay

## 2024-02-17 ENCOUNTER — Emergency Department
Admission: EM | Admit: 2024-02-17 | Discharge: 2024-02-17 | Disposition: A | Discharge status: Home / Self Care | Payer: Medicare Other | Attending: Emergency Medicine | Admitting: Emergency Medicine

## 2024-02-17 ENCOUNTER — Emergency Department: Payer: Medicare Other

## 2024-02-17 DIAGNOSIS — Z8572 Personal history of non-Hodgkin lymphomas: Secondary | ICD-10-CM | POA: Diagnosis not present

## 2024-02-17 DIAGNOSIS — I110 Hypertensive heart disease with heart failure: Secondary | ICD-10-CM | POA: Diagnosis not present

## 2024-02-17 DIAGNOSIS — I509 Heart failure, unspecified: Secondary | ICD-10-CM | POA: Diagnosis not present

## 2024-02-17 DIAGNOSIS — I2510 Atherosclerotic heart disease of native coronary artery without angina pectoris: Secondary | ICD-10-CM | POA: Diagnosis not present

## 2024-02-17 DIAGNOSIS — I10 Essential (primary) hypertension: Secondary | ICD-10-CM

## 2024-02-17 DIAGNOSIS — Z7901 Long term (current) use of anticoagulants: Secondary | ICD-10-CM | POA: Insufficient documentation

## 2024-02-17 DIAGNOSIS — R030 Elevated blood-pressure reading, without diagnosis of hypertension: Secondary | ICD-10-CM | POA: Diagnosis present

## 2024-02-17 LAB — BASIC METABOLIC PANEL
Anion gap: 9 (ref 5–15)
BUN: 13 mg/dL (ref 8–23)
CO2: 26 mmol/L (ref 22–32)
Calcium: 9.4 mg/dL (ref 8.9–10.3)
Chloride: 107 mmol/L (ref 98–111)
Creatinine, Ser: 0.94 mg/dL (ref 0.61–1.24)
GFR, Estimated: >60 mL/min (ref >60)
Glucose, Bld: 82 mg/dL (ref 70–99)
Potassium: 4.2 mmol/L (ref 3.5–5.1)
Sodium: 142 mmol/L (ref 135–145)

## 2024-02-17 LAB — CBC
HCT: 46.3 % (ref 39.0–52.0)
Hemoglobin: 15.3 g/dL (ref 13.0–17.0)
MCH: 30.2 pg (ref 26.0–34.0)
MCHC: 33 g/dL (ref 30.0–36.0)
MCV: 91.5 fL (ref 80.0–100.0)
Platelets: 334 10*3/uL (ref 150–400)
RBC: 5.06 MIL/uL (ref 4.22–5.81)
RDW: 14.4 % (ref 11.5–15.5)
WBC: 7.7 10*3/uL (ref 4.0–10.5)
nRBC: 0 % (ref 0.0–0.2)

## 2024-02-17 LAB — TROPONIN I (HIGH SENSITIVITY)
Troponin I (High Sensitivity): 4 ng/L (ref <18)
Troponin I (High Sensitivity): 5 ng/L (ref <18)

## 2024-02-17 LAB — HEPATIC FUNCTION PANEL
ALT: 18 U/L (ref 0–44)
AST: 18 U/L (ref 15–41)
Albumin: 3.9 g/dL (ref 3.5–5.0)
Alkaline Phosphatase: 70 U/L (ref 38–126)
Bilirubin, Direct: <0.1 mg/dL (ref 0.0–0.2)
Total Bilirubin: 0.6 mg/dL (ref 0.0–1.2)
Total Protein: 7.2 g/dL (ref 6.5–8.1)

## 2024-02-17 LAB — BRAIN NATRIURETIC PEPTIDE: B Natriuretic Peptide: 36.4 pg/mL (ref 0.0–100.0)

## 2024-02-17 MED ORDER — ACETAMINOPHEN 500 MG PO TABS
1000.0000 mg | ORAL_TABLET | Freq: Once | ORAL | Status: AC
  Administered 2024-02-17: 1000 mg via ORAL
  Filled 2024-02-17: qty 2

## 2024-02-17 NOTE — ED Triage Notes (Signed)
 HEart pounding for a few days.  Recent study at [facility] for possible sleep apnea, BP was elevated at that time.  While at work today felt heart pounding and Dr. Okey Dupre sent patient to be seen.

## 2024-02-17 NOTE — Discharge Instructions (Addendum)
 Take your Lasix for the next 3 days to try to help with blood pressure and help with the swelling in your legs.  Take it in the morning as it will take about 6 hours but it will work.  Follow-up with Dr. Okey Dupre to discuss your blood pressure regimen and further addition of blood pressure medicines as needed.  Follow-up worsening headache, chest pain, dizziness, shortness of breath or any other concerns please return to the ER for repeat evaluation

## 2024-02-17 NOTE — ED Provider Notes (Addendum)
 [facility] Provider Note    Event Date/Time   First MD Initiated Contact with Patient 02/17/24 1109     (approximate)   History   Hypertension   HPI  Brian Atkins is a 68 y.o. male with history of multiple lymphoma, CHF, coronary disease he comes in with concerns for elevated blood pressure.  Patient is on Eliquis due to prior PE he has been compliant with it.  He reports that he is noticed his blood pressures have been elevated most recently in the 160s to 180s.  He reports a very mild headache but he states that this is typical for him to get headaches he has a history of having headaches that have injections done by neurology.  He reported a little bit of dizziness but now has all resolved.  He states that he tried to go over to the [facility] clinic and came over here due to elevated blood pressure.  Does report some swelling in his legs.  He states that he recently has not been taking Lasix as it is only as needed.  He did not really notice the swelling in his legs.  He denies any chest pain chest little bit of dizziness earlier today.   Patient reports being compliant with his carvedilol, losartan.  Physical Exam   Triage Vital Signs: ED Triage Vitals [02/17/24 1037]  Encounter Vitals Group     BP (!) 124/92     Systolic BP Percentile      Diastolic BP Percentile      Pulse Rate (!) 56     Resp 16     Temp 98.5 F (36.9 C)     Temp src      SpO2 98 %     Weight 216 lb 0.8 oz (98 kg)     Height      Head Circumference      Peak Flow      Pain Score 0     Pain Loc      Pain Education      Exclude from Growth Chart     Most recent vital signs: Vitals:   02/17/24 1037  BP: (!) 124/92  Pulse: (!) 56  Resp: 16  Temp: 98.5 F (36.9 C)  SpO2: 98%     General: Awake, no distress.  CV:  Good peripheral perfusion.  Resp:  Normal effort.  Abd:  No distention.  Other:  Edema noted in bilateral legs. No calf tenderness  Cranial 2 through 12 are  intact.  Equal finger-to-nose.   ED Results / Procedures / Treatments   Labs (all labs ordered are listed, but only abnormal results are displayed) Labs Reviewed  BASIC METABOLIC PANEL  CBC  BRAIN NATRIURETIC PEPTIDE  HEPATIC FUNCTION PANEL  URINALYSIS, ROUTINE W REFLEX MICROSCOPIC  TROPONIN I (HIGH SENSITIVITY)  TROPONIN I (HIGH SENSITIVITY)     EKG  My interpretation of EKG:  Sinus bradycardia rate of 58 without any ST elevation, T wave inversion lead III, normal intervals.  Has had prior T wave inversion previously.  RADIOLOGY I have reviewed the xray personally and interpreted and no evidence of any pneumonia or edema   PROCEDURES:  Critical Care performed: No  .1-3 Lead EKG Interpretation  Performed by: Concha Se, MD Authorized by: Concha Se, MD     Interpretation: normal     ECG rate:  50   ECG rate assessment: bradycardic     Rhythm: sinus bradycardia  Ectopy: none     Conduction: normal      MEDICATIONS ORDERED IN ED: Medications  acetaminophen (TYLENOL) tablet 1,000 mg (1,000 mg Oral Given 02/17/24 1209)     IMPRESSION / MDM / ASSESSMENT AND PLAN / ED COURSE  I reviewed the triage vital signs and the nursing notes.   Patient's presentation is most consistent with acute presentation with potential threat to life or bodily function.   Comes in with concerns for mild headache, dizziness, elevated blood pressure.  We discussed CT imaging to evaluate for intracranial hemorrhage.  Patient declined stating that this feels like his typical headaches that he typically gets.  He is followed by neurology for them.  Patient is on Eliquis and we discussed that there is a risk for bleeding when on Eliquis and that sometimes people can present pretty minimally and still have a bleed in the brain.  However given his normal neurological examination and this feeling like his typical headache patient declined CT imaging.  I have low suspicion overall he but we  did discuss the risk of missed significant cranial hemorrhage and that it would change workup including lowering his blood pressure more emergently and preventing severe bleeding.  BMP shows normal creatinine.  CBC is reassuring troponin was negative.  BMP reassuring  Given his edema in his legs we did recommend restarting his Lasix which is 40 mg for the next 3 days.  Will have him follow-up at the cardiologist.    1:11 PM repeat evaluation patient reports feeling very well.  He denies any headaches after the Tylenol. Ambulating around room without dizziness. He again did not want to do CT imaging given resolution of symptoms.  Patient's blood pressure still slightly elevated.  His workup was reassuring without evidence of ACS, severe heart failure exacerbation or other acute pathology.  We discussed doing 3 days of Lasix and he expressed understanding and felt comfortable with following up with Dr. Okey Dupre outpatient   Pt declined UA   The patient is on the cardiac monitor to evaluate for evidence of arrhythmia and/or significant heart rate changes.      FINAL CLINICAL IMPRESSION(S) / ED DIAGNOSES   Final diagnoses:  Hypertension, unspecified type     Rx / DC Orders   ED Discharge Orders     None        Note:  This document was prepared using Dragon voice recognition software and may include unintentional dictation errors.   Concha Se, MD 02/17/24 1312    Concha Se, MD 02/17/24 [phone]

## 2024-02-28 ENCOUNTER — Other Ambulatory Visit (HOSPITAL_COMMUNITY): Payer: Self-pay

## 2024-03-04 NOTE — Progress Notes (Unsigned)
 Cardiology Office Note    Date:  03/06/2024   ID:  Brian Atkins, DOB 1956-06-11, MRN [id number]  PCP:  Sherlene Shams, MD  Cardiologist:  Yvonne Kendall, MD  Electrophysiologist:  None   Chief Complaint: Follow up  History of Present Illness:   Brian Atkins is a 68 y.o. male with history of CAD by LHC in 11/2017 and coronary CTA in 11/2019, marginal zone B-cell lymphoma, restrictive lung disease, SBO requiring emergent surgery, PE with prior DVT/PE, HTN, HLD with statin intolerance, peripheral neuropathy, sleep apnea on CPAP, and GERD status post Nissen fundoplication who presents for follow-up of CAD.  He was evaluated in 11/2017 for chest pain, palpitations, headaches, and elevated BP.  He subsequently underwent LHC which revealed moderate nonobstructive CAD involving the proximal/mid LAD and mid RCA.  Both lesions were not hemodynamically significant by fractional flow reserve (FFR of LAD 0.84, FFR RCA 0.94).  LVEF greater than 65% with a moderately elevated LVEDP consistent with diastolic dysfunction.  Medical therapy was recommended.  Echo 12/29/17 showed an EF of 60-65%, normal wall motion, normal LV diastolic function, and a mildly dilated LA.  He was seen in 11/2019 with recurrent chest pain and underwent coronary CTA which showed a calcium score of 496 and was the 86th percentile.  He had mild diffuse nonobstructive CAD with calcified plaque noted in the left main, LAD, LCx, and RCA with a maximum identified stenosis of 25 to 49%.  Medical therapy was recommended.  He was diagnosed with Covid in 12/2019, and did not require hospital admission.  He was seen in 01/2020 continuing to note persistent exertional dyspnea, fatigue, and cough following his COVID-19 infection.  He reported two-pillow orthopnea and a 5 pound weight gain despite poor appetite since contracting Covid.  In this setting, he underwent echo in 01/2020, which showed a normal LVEF with grade 1 diastolic dysfunction and no  significant valvular abnormalities.  He was evaluated by pulmonology in 02/2020 who felt like his symptoms were likely due to uncontrolled sleep apnea, GERD, and chronic rhinitis.  PFTs were recommended, and ultimately completed in 11/2020, which showed moderate restrictive lung disease with consideration for ILD.  He was seen by cardiology in 03/2020 noting multiple complaints including a several week history of numbness in his legs and feet that was most pronounced when lying down, which is followed by neurology.  He also noted exertional dyspnea that varied from one day to the next.  In the setting of lower extremity swelling, he had increased his Lasix to 40 mg daily and decreased his amlodipine with improvement in symptoms.  He was continued on Lasix 40 mg daily.  His leg pain was most consistent with a neuropathic process though given his risk factors he underwent lower extremity ABIs which were normal.  He was seen in 07/2020 reporting palpitations with ZIO monitor at that demonstrating sinus rhythm with rare PACs and PVCs with no sustained arrhythmia or prolonged pauses.    He was admitted to the hospital in 03/2022 with a bowel obstruction with emergent surgery and PE status post thrombectomy.  During that admission, echo demonstrated an EF of 60 to 65%, no regional wall motion abnormalities, grade 2 diastolic dysfunction, normal RV systolic function with mildly enlarged ventricular cavity size, and aortic valve sclerosis without evidence of stenosis.   He was seen in the office in 07/2022 and was largely recovered from his admission in the spring.  However, he did feel more short of  breath and reported intermittent chest tightness with exertion.  Given symptoms, he underwent echo in 08/2022 which showed an EF of 60 to 65%, no regional wall motion abnormalities, mild LVH, grade 2 diastolic dysfunction, normal RV systolic function and ventricular cavity size, no significant valvular abnormalities, and an  estimated right atrial pressure of 3 mmHg.  Lexiscan MPI in 08/2022 showed no evidence of ischemia.   He was seen in the office on 10/21/2022 and noted an increase in palpitation burden occurring on an almost daily basis and lasting 5 to 10 seconds in duration.  Zio patch showed a predominant rhythm of sinus with an average rate of 66 bpm (range 47 to 128 bpm, 1 run of SVT lasting 4 beats with a maximum rate of 128 bpm, 1 pause occurred lasting 3.3 seconds at 7:48 AM (Sunday, 11/07/2022) and was asymptomatic, and rare atrial/ventricular ectopy.  Patient triggered events corresponded to sinus rhythm and PVCs.   He was seen in the office in 11/2022 and noted improvement in palpitation burden.  He reported intermittent unsteadiness with recommendation to reduce carvedilol to 6.25 mg twice daily.  He was felt to be acceptable risk for noncardiac shoulder surgery.   He was seen in the office in 03/2023 and continued to note intermittent palpitations described as "heart pounding."  Symptoms were worse with reduction of carvedilol leading him to titrate carvedilol back up to 12.5 mg twice daily.  Exertional fatigue and shortness of breath were stable.  He reported to frequently missing atorvastatin secondary to "brain fog and myalgias."  In this setting, it was recommended he undergo a trial of Praluent to reduce ASCVD risk, however his insurance declined coverage.  He was last seen in the office in 11/2023 continuing to note exertional fatigue and dyspnea.  He underwent myocardial PET/CT on 12/22/2023 that showed no evidence of ischemia or infarction, preserved LV systolic function at rest and with stress, and was overall low risk/normal.  Multivessel coronary artery calcification noted on CT attenuation corrected imaging.  He was seen in the ED on 02/17/2024 with concern for elevated blood pressure in the 160s to 180s.  Blood pressure in the ED 124/92.  High-sensitivity troponin negative x 2.  BNP 36.  Chest x-ray  without active cardiopulmonary process.  EKG showed sinus no acute ST-T changes.  There was concern for some lower extremity swelling with recommendation for him to take Lasix 40 mg daily for the next 3 days and follow-up as an outpatient.  He comes in today and is without current symptoms of angina or cardiac decompensation.  He does continue to note some fatigue.  He reports some "heart pounding" sensations while he was noted to have elevated blood pressure readings last month.  He is unclear what led to his elevated readings in late February.  Blood pressures have since improved.  He has remained adherent to pharmacotherapy and denies any significant dietary changes.  He continues to struggle with GI bloating.  Notes stable trivial lower extremity swelling, for which she takes as needed furosemide.  No falls, hematochezia, or melena.   Labs independently reviewed: 01/2024 - Hgb 15.3, PLT 334, potassium 4.2, BUN 13, serum creatinine 0.94, TSH normal, A1c 5.9, TC 194, TG 177, HDL 36, LDL 123, albumin 3.9, AST/ALT normal  Past Medical History:  Diagnosis Date   Complication of anesthesia    a.) delayed emergence   Coronary artery disease, non-occlusive    a. 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) etiology felt to be related to XRT; "two hours after receiving bilateral orbital radiation patient was noted to have increased swelling of the orbital area which progressed to include the cheek and lips, an intense itching of the eyes bilaterally";  reaction resulted in intubation (06/27/2014 - 07/02/2014)   Insomnia    a.) on prescribed hypnotics (zolpidem + eszopiclone)   Labile hypertension    Left rotator cuff tear    Long term current use of anticoagulant    a.) apixaban   Medial epicondylitis of right elbow    Meralgia paresthetica of right side    Microscopic hematuria    Myalgia due to statin    Ocular migraine    OSA on CPAP    Pneumonia    Prediabetes    PSVT (paroxysmal supraventricular tachycardia) (HCC)    Pulmonary emboli (HCC) 03/28/2022   a. assoicated (+) RIGHT heart strain; required mechanical thrombectomy   RLS (restless legs syndrome)    a.) on ropinirole   Splenic vein thrombosis    a.) treated with enoxaparin BID x 6 months    Past Surgical History:  Procedure Laterality Date   BRONCHIAL WASHINGS N/A 08/21/2021   Procedure: BRONCHIAL WASHINGS;  Surgeon: Vida Rigger, MD;  Location: [facility];  Service: Thoracic;  Laterality: N/A;   COLONOSCOPY     CORONARY PRESSURE/FFR STUDY N/A 12/07/2017   Procedure: INTRAVASCULAR PRESSURE WIRE/FFR STUDY;  Surgeon: Yvonne Kendall, MD;  Location: [facility];  Service: Cardiovascular;  Laterality: N/A;   FLEXIBLE BRONCHOSCOPY N/A 08/21/2021   Procedure: FLEXIBLE BRONCHOSCOPY;  Surgeon: Vida Rigger, MD;  Location: [facility];  Service: Thoracic;  Laterality: N/A;   LEFT HEART CATH AND CORONARY ANGIOGRAPHY Left 12/07/2017   Procedure: LEFT  HEART CATH AND CORONARY ANGIOGRAPHY;  Surgeon: Yvonne Kendall, MD;  Location: [facility];  Service: Cardiovascular;  Laterality: Left;   nissen funduplication  2009   [name]   PULMONARY THROMBECTOMY N/A 03/29/2022   Procedure: PULMONARY THROMBECTOMY;  Surgeon: Annice Needy, MD;  Location: [facility];  Service: Cardiovascular;  Laterality: N/A;   SEPTOPLASTY Bilateral 08/23/2019   Procedure: SEPTOPLASTY;  Surgeon: Vernie Murders, MD;  Location: [facility];  Service: ENT;  Laterality: Bilateral;   SHOULDER ARTHROSCOPY Left 02/22/2023   Procedure: ARTHROSCOPY SHOULDER;  Surgeon: Juanell Fairly, MD;  Location: [facility];  Service: Orthopedics;  Laterality: Left;   SHOULDER ARTHROSCOPY WITH OPEN ROTATOR CUFF REPAIR  AND DISTAL CLAVICLE ACROMINECTOMY Left 02/22/2023   Procedure: SHOULDER ARTHROSCOPY WITH OPEN ROTATOR CUFF REPAIR, DISTAL CLAVICLE ACROMINECTOMY, SUBACROMIAL DECOMPRESSION, DISTAL CLAVICLE EXCISION, AND BICEPS TENODESIS;  Surgeon: Juanell Fairly, MD;  Location: [facility];  Service: Orthopedics;  Laterality: Left;   SPLENECTOMY     TURBINATE REDUCTION Bilateral 08/23/2019   Procedure: TURBINATE REDUCTION;  Surgeon: Vernie Murders, MD;  Location: [facility];  Service: ENT;  Laterality: Bilateral;  Latex sensitivity sleep apnea    Current Medications: Current Meds  Medication Sig   Alirocumab (PRALUENT) 150 MG/ML SOAJ Inject 1 mL (150 mg total) into the skin every 14 (fourteen) days.   ALPRAZolam (XANAX) 0.5 MG tablet Take 1 tablet (0.5 mg total) by mouth 2 (two) times daily as needed for anxiety.   carboxymethylcellulose (REFRESH PLUS) 0.5 % SOLN Apply to eye as needed (dry eyes).   carvedilol (COREG) 25 MG tablet Take 1 tablet (25 mg total) by mouth 2 (two) times daily with a meal.   cyanocobalamin (VITAMIN B12) 1000 MCG/ML injection INJECT INTO THE MUSCLE EVERY 30 DAYS   ELIQUIS 5 MG TABS tablet TAKE ONE TABLET BY MOUTH TWICE A DAY    Eszopiclone 3 MG TABS TAKE ONE TABLET BY MOUTH AT BEDTIME ALTERNATING WITH AMBIEN   ezetimibe (ZETIA) 10 MG tablet Take 1 tablet (10 mg total) by mouth every morning.   famotidine (PEPCID) 20 MG tablet Take 20 mg by mouth 2 (two) times daily.   furosemide (LASIX) 40 MG tablet Take 40 mg by mouth as needed.   losartan (COZAAR) 100 MG tablet TAKE ONE TABLET BY MOUTH ONE TIME DAILY   Multiple Vitamins-Minerals (MULTIVITAMIN WITH MINERALS) tablet Take 1 tablet by mouth daily.   nitroGLYCERIN (NITROSTAT) 0.4 MG SL tablet Place 1 tablet (0.4 mg total) under the tongue every 5 (five) minutes as needed for chest pain. Maximum of 3 doses.   omeprazole (PRILOSEC) 40 MG capsule Take 40 mg by mouth every morning.   ondansetron (ZOFRAN) 4 MG tablet Take 1 tablet (4 mg total) by mouth every 8 (eight) hours as needed.   ONETOUCH VERIO test strip USE TO CHECK BLOOD SUGAR AS NEEDED   Prucalopride Succinate (MOTEGRITY) 2 MG TABS Take 2 mg by mouth daily as needed (constipation).   pyridostigmine (MESTINON) 60 MG tablet Take 60 mg by mouth 3 (three) times daily.   rOPINIRole (REQUIP) 0.25 MG tablet Take 0.25 mg by mouth in the morning and at bedtime.    Syringe/Needle, Disp, (SYRINGE 3CC/25GX1") 25G X 1" 3 ML MISC Use for b12 injections   tiZANidine (ZANAFLEX) 4 MG tablet TAKE ONE-HALF TO ONE TABLET BY MOUTH AT BEDTIME AS NEEDED FOR MUSCLE SPASM   triamcinolone (NASACORT) 55 MCG/ACT AERO nasal inhaler Place 2 sprays into the nose every 8 (eight) hours as needed.   zolpidem (AMBIEN CR) 6.25 MG CR tablet TAKE TWO TABLETS BY MOUTH AT BEDTIME AS NEEDED FOR SLEEP    Allergies:   Avelox [moxifloxacin], Gadolinium derivatives, Contrast media [iodinated contrast media], Crestor [rosuvastatin calcium], Morphine and codeine, Niacin, Niacin-simvastatin er, Latex, Tape, Tapentadol, and Wound dressing adhesive   Social History   Socioeconomic History   Marital status: Married    Spouse name: Not on file   Number of  children: 2   Years of education: 14   Highest education level: Not on file  Occupational History   Occupation: Shop Nurse, mental health: [employer]    Comment: [employer]  Tobacco Use   Smoking  status: Never    Passive exposure: Past   Smokeless tobacco: Never  Vaping Use   Vaping status: Never Used  Substance and Sexual Activity   Alcohol use: No   Drug use: No   Sexual activity: Yes  Other Topics Concern   Not on file  Social History Narrative   Married   Works    Social Drivers of Corporate investment banker Strain: Low Risk  (11/23/2023)   Overall Financial Resource Strain (CARDIA)    Difficulty of Paying Living Expenses: Not hard at all  Food Insecurity: No Food Insecurity (11/23/2023)   Hunger Vital Sign    Worried About Running Out of Food in the Last Year: Never true    Ran Out of Food in the Last Year: Never true  Transportation Needs: No Transportation Needs (11/23/2023)   PRAPARE - Administrator, Civil Service (Medical): No    Lack of Transportation (Non-Medical): No  Physical Activity: Inactive (11/23/2023)   Exercise Vital Sign    Days of Exercise per Week: 0 days    Minutes of Exercise per Session: 0 min  Stress: No Stress Concern Present (11/23/2023)   [employer] of Occupational Health - Occupational Stress Questionnaire    Feeling of Stress : Not at all  Social Connections: Moderately Integrated (11/23/2023)   Social Connection and Isolation Panel [NHANES]    Frequency of Communication with Friends and Family: More than three times a week    Frequency of Social Gatherings with Friends and Family: More than three times a week    Attends Religious Services: More than 4 times per year    Active Member of [employer] or Organizations: No    Attends Engineer, structural: Never    Marital Status: Married     Family History:  The patient's family history includes Aortic aneurysm (age of onset: 63) in his father; COPD in his mother;  Cancer in his mother; Coronary artery disease (age of onset: 2) in his father; Hyperlipidemia in his father; Hypertension in his mother; Lung cancer in his mother; Stomach cancer in his maternal grandfather.  ROS:   12-point review of systems is negative unless otherwise noted in the HPI.   EKGs/Labs/Other Studies Reviewed:    Studies reviewed were summarized above. The additional studies were reviewed today: As above  EKG:  EKG is not ordered today.    Recent Labs: 02/07/2024: TSH 1.42 02/17/2024: ALT 18; B Natriuretic Peptide 36.4; BUN 13; Creatinine, Ser 0.94; Hemoglobin 15.3; Platelets 334; Potassium 4.2; Sodium 142  Recent Lipid Panel    Component Value Date/Time   CHOL 194 02/02/2024 1748   TRIG 177 (H) 02/02/2024 1748   HDL 36 (L) 02/02/2024 1748   CHOLHDL 5.4 02/02/2024 1748   VLDL 35 02/02/2024 1748   LDLCALC 123 (H) 02/02/2024 1748   LDLCALC 162 (H) 10/24/2020 1608   LDLDIRECT 173.0 01/24/2023 1451    PHYSICAL EXAM:    VS:  BP 118/78 (BP Location: Left Arm)   Pulse 60   Ht 6\' 2"  (1.88 m)   Wt 211 lb 12.8 oz (96.1 kg)   SpO2 94%   BMI 27.19 kg/m   BMI: Body mass index is 27.19 kg/m.  Physical Exam Vitals reviewed.  Constitutional:      Appearance: He is well-developed.  HENT:     Head: Normocephalic and atraumatic.  Eyes:     General:        Right eye: No discharge.  Left eye: No discharge.  Cardiovascular:     Rate and Rhythm: Normal rate and regular rhythm.     Heart sounds: Normal heart sounds, S1 normal and S2 normal. Heart sounds not distant. No midsystolic click and no opening snap. No murmur heard.    No friction rub.  Pulmonary:     Effort: Pulmonary effort is normal. No respiratory distress.     Breath sounds: Normal breath sounds. No decreased breath sounds, wheezing, rhonchi or rales.  Chest:     Chest wall: No tenderness.  Musculoskeletal:     Cervical back: Normal range of motion.     Comments: Trivial bilateral pretibial edema.   Skin:    General: Skin is warm and dry.     Nails: There is no clubbing.  Neurological:     Mental Status: He is alert and oriented to person, place, and time.  Psychiatric:        Speech: Speech normal.        Behavior: Behavior normal.        Thought Content: Thought content normal.        Judgment: Judgment normal.     Wt Readings from Last 3 Encounters:  03/06/24 211 lb 12.8 oz (96.1 kg)  02/17/24 216 lb 0.8 oz (98 kg)  02/07/24 217 lb (98.4 kg)     ASSESSMENT & PLAN:   Nonobstructive CAD: No symptoms suggestive of angina or cardiac decompensation.  Prior LHC and coronary CTA showed nonobstructive disease.  Lexiscan MPI in 08/2022 showed no evidence of ischemia.  Myocardial PET/CT in 12/2023 showed no evidence of ischemia.  No further ischemic cardiac testing indicated at this time.  If symptoms return would recommend pursuing pulmonary evaluation and/or CPX.  Continue aggressive risk factor modification and primary prevention, including apixaban in place of aspirin given history of recurrent DVT along with ezetimibe, carvedilol, losartan, and as needed SL NTG.  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  No evidence of RV dysfunction on prior echo.  HTN: Blood pressure is well-controlled in the office today at 118/78.  Recommend the keep a blood pressure log over the next 2 weeks and update [place] with readings.  For now, remains on carvedilol 25 mg twice daily and losartan 100 mg daily.  HLD with statin intolerance: LDL 123 in 01/2024.  He has myalgias associate with simvastatin, rosuvastatin, and atorvastatin.  There was delay in getting him started on PCSK9 inhibitor due to insurance denial/prior authorization.  He has now received Praluent and has taken 1 dose, due for next dose later today.  Recommend continuing PCSK9 inhibitor and follow-up fasting lipid panel in 2 to 3 months.     Disposition: F/u with Dr. Okey Dupre or an APP in 3 months.   Medication  Adjustments/Labs and Tests Ordered: Current medicines are reviewed at length with the patient today.  Concerns regarding medicines are outlined above. Medication changes, Labs and Tests ordered today are summarized above and listed in the Patient Instructions accessible in Encounters.   Signed, Eula Listen, PA-C 03/06/2024 5:37 PM     [facility] [place] [address] [place], [place] [address] [phone]

## 2024-03-06 ENCOUNTER — Ambulatory Visit: Payer: Medicare Other | Attending: Physician Assistant | Admitting: Physician Assistant

## 2024-03-06 ENCOUNTER — Other Ambulatory Visit: Payer: Self-pay | Admitting: Internal Medicine

## 2024-03-06 ENCOUNTER — Other Ambulatory Visit: Payer: Self-pay | Admitting: Nurse Practitioner

## 2024-03-06 VITALS — BP 118/78 | HR 60 | Ht 74.0 in | Wt 211.8 lb

## 2024-03-06 DIAGNOSIS — I2510 Atherosclerotic heart disease of native coronary artery without angina pectoris: Secondary | ICD-10-CM | POA: Insufficient documentation

## 2024-03-06 DIAGNOSIS — Z789 Other specified health status: Secondary | ICD-10-CM | POA: Diagnosis present

## 2024-03-06 DIAGNOSIS — R519 Headache, unspecified: Secondary | ICD-10-CM

## 2024-03-06 DIAGNOSIS — M5030 Other cervical disc degeneration, unspecified cervical region: Secondary | ICD-10-CM

## 2024-03-06 DIAGNOSIS — I10 Essential (primary) hypertension: Secondary | ICD-10-CM | POA: Diagnosis present

## 2024-03-06 DIAGNOSIS — E785 Hyperlipidemia, unspecified: Secondary | ICD-10-CM | POA: Insufficient documentation

## 2024-03-06 DIAGNOSIS — M542 Cervicalgia: Secondary | ICD-10-CM

## 2024-03-06 DIAGNOSIS — M19012 Primary osteoarthritis, left shoulder: Secondary | ICD-10-CM

## 2024-03-06 DIAGNOSIS — Z86711 Personal history of pulmonary embolism: Secondary | ICD-10-CM | POA: Diagnosis present

## 2024-03-06 DIAGNOSIS — M25512 Pain in left shoulder: Secondary | ICD-10-CM

## 2024-03-06 MED ORDER — EZETIMIBE 10 MG PO TABS: 10.0000 mg | ORAL_TABLET | ORAL | 3 refills | Status: AC

## 2024-03-06 MED ORDER — LOSARTAN POTASSIUM 100 MG PO TABS: 100.0000 mg | ORAL_TABLET | Freq: Every day | ORAL | 3 refills | Status: DC

## 2024-03-06 NOTE — Patient Instructions (Signed)
 Medication Instructions:  Your physician recommends that you continue on your current medications as directed. Please refer to the Current Medication list given to you today.  *If you need a refill on your cardiac medications before your next appointment, please call your pharmacy*   Lab Work: No labs ordered today  If you have labs (blood work) drawn today and your tests are completely normal, you will receive your results only by: MyChart Message (if you have MyChart) OR A paper copy in the mail If you have any lab test that is abnormal or we need to change your treatment, we will call you to review the results.   Testing/Procedures: No test ordered today    Follow-Up: At [facility], you and your health needs are our priority.  As part of our continuing mission to provide you with exceptional heart care, we have created designated Provider Care Teams.  These Care Teams include your primary Cardiologist (physician) and Advanced Practice Providers (APPs -  Physician Assistants and Nurse Practitioners) who all work together to provide you with the care you need, when you need it.    Your next appointment:   2 month(s)  Provider:   You may see Yvonne Kendall, MD or one of the following Advanced Practice Providers on your designated Care Team:   Nicolasa Ducking, NP Eula Listen, PA-C Cadence Fransico Michael, PA-C Charlsie Quest, NP Carlos Levering, NP    Other Instructions   Blood Pressure Log 2 weeks

## 2024-03-20 ENCOUNTER — Encounter: Payer: Self-pay | Admitting: Internal Medicine

## 2024-03-20 ENCOUNTER — Telehealth: Payer: Self-pay | Admitting: Internal Medicine

## 2024-03-20 NOTE — Telephone Encounter (Signed)
 Left message for patient to call and reschedule appointment for 08/06/2024  E2c2 please reschedule appointment.

## 2024-04-11 ENCOUNTER — Other Ambulatory Visit: Payer: Self-pay | Admitting: Internal Medicine

## 2024-04-20 ENCOUNTER — Telehealth: Payer: Self-pay

## 2024-04-20 NOTE — Telephone Encounter (Signed)
 We received a preoperative clearance from [facility] via fax.  I sent a copy to Dr. Nyle Belling folder on the S drive and hand-delivered a copy to Chadwick Colonel, CMA.

## 2024-04-29 ENCOUNTER — Encounter: Payer: Self-pay | Admitting: Internal Medicine

## 2024-05-01 MED ORDER — ESZOPICLONE 3 MG PO TABS: 3.0000 mg | ORAL_TABLET | Freq: Every evening | ORAL | 2 refills | Status: DC | PRN

## 2024-05-01 NOTE — Telephone Encounter (Signed)
 Requesting: eszopickine Contract: No UDS: no Last Visit: 02/07/2024 Next Visit: Visit date not found Last Refill: E-Prescribing Status: Receipt confirmed by pharmacy (04/11/2024 12:30 PM EDT)

## 2024-05-01 NOTE — Addendum Note (Signed)
 Addended by: Gianno Volner on: 05/01/2024 09:59 AM   Modules accepted: Orders

## 2024-05-07 ENCOUNTER — Ambulatory Visit: Attending: Physician Assistant | Admitting: Physician Assistant

## 2024-05-07 ENCOUNTER — Encounter: Payer: Self-pay | Admitting: Physician Assistant

## 2024-05-07 VITALS — BP 143/81 | HR 57 | Ht 74.0 in | Wt 211.8 lb

## 2024-05-07 DIAGNOSIS — Z789 Other specified health status: Secondary | ICD-10-CM | POA: Diagnosis present

## 2024-05-07 DIAGNOSIS — I2510 Atherosclerotic heart disease of native coronary artery without angina pectoris: Secondary | ICD-10-CM | POA: Diagnosis present

## 2024-05-07 DIAGNOSIS — E785 Hyperlipidemia, unspecified: Secondary | ICD-10-CM | POA: Insufficient documentation

## 2024-05-07 DIAGNOSIS — G4733 Obstructive sleep apnea (adult) (pediatric): Secondary | ICD-10-CM | POA: Insufficient documentation

## 2024-05-07 DIAGNOSIS — I10 Essential (primary) hypertension: Secondary | ICD-10-CM | POA: Insufficient documentation

## 2024-05-07 DIAGNOSIS — Z86711 Personal history of pulmonary embolism: Secondary | ICD-10-CM | POA: Diagnosis present

## 2024-05-07 DIAGNOSIS — Z79899 Other long term (current) drug therapy: Secondary | ICD-10-CM | POA: Insufficient documentation

## 2024-05-07 MED ORDER — HYDROCHLOROTHIAZIDE 12.5 MG PO CAPS: 12.5000 mg | ORAL_CAPSULE | Freq: Every day | ORAL | 3 refills | Status: DC

## 2024-05-07 NOTE — Patient Instructions (Signed)
 Medication Instructions:  Your physician recommends the following medication changes.  START TAKING: Hydrochlorothiazide  12.5 mg by mouth daily   *If you need a refill on your cardiac medications before your next appointment, please call your pharmacy*  Lab Work: Your provider would like for you to return in 1 week to have the following labs drawn: (BMP, Lipid).   Please go to [facility] [address] ([facility] [address], [place] [address] You do not need an appointment.  They are open from 8 am- 4:30 pm.  Lunch from 1:00 pm- 2:00 pm  You will need to be fasting.   Testing/Procedures: No test ordered today   Follow-Up: At [facility], you and your health needs are our priority.  As part of our continuing mission to provide you with exceptional heart care, our providers are all part of one team.  This team includes your primary Cardiologist (physician) and Advanced Practice Providers or APPs (Physician Assistants and Nurse Practitioners) who all work together to provide you with the care you need, when you need it.  Your next appointment:   2 month(s)  Provider:   You may see Sammy Crisp, MD or one of the following Advanced Practice Providers on your designated Care Team:   Laneta Pintos, NP Gildardo Labrador, PA-C Varney Gentleman, PA-C Cadence [place], PA-C Ronald Cockayne, NP Morey Ar, NP

## 2024-05-07 NOTE — Progress Notes (Signed)
 Cardiology Office Note    Date:  05/07/2024   ID:  Brian Atkins, DOB 03-14-1956, MRN [id number]  PCP:  Thersia Flax, MD  Cardiologist:  Sammy Crisp, MD  Electrophysiologist:  None   Chief Complaint: Follow up  History of Present Illness:   Brian Atkins is a 68 y.o. male with history of nonobstructive CAD by LHC in 11/2017 and coronary CTA in 11/2019, marginal zone B-cell lymphoma, restrictive lung disease, SBO requiring emergent surgery, PE with prior DVT/PE, HTN, HLD with statin intolerance, peripheral neuropathy, sleep apnea on CPAP, and GERD status post Nissen fundoplication who presents for follow-up of CAD.   He was evaluated in 11/2017 for chest pain, palpitations, headaches, and elevated BP.  He subsequently underwent LHC which revealed moderate nonobstructive CAD involving the proximal/mid LAD and mid RCA.  Both lesions were not hemodynamically significant by fractional flow reserve (FFR of LAD 0.84, FFR RCA 0.94).  LVEF greater than 65% with a moderately elevated LVEDP consistent with diastolic dysfunction.  Medical therapy was recommended.  Echo 12/29/17 showed an EF of 60-65%, normal wall motion, normal LV diastolic function, and a mildly dilated LA.  He was seen in 11/2019 with recurrent chest pain and underwent coronary CTA which showed a calcium  score of 496 and was the 86th percentile.  He had mild diffuse nonobstructive CAD with calcified plaque noted in the left main, LAD, LCx, and RCA with a maximum identified stenosis of 25 to 49%.  Medical therapy was recommended.  He was diagnosed with Covid in 12/2019, and did not require hospital admission.  He was seen in 01/2020 continuing to note persistent exertional dyspnea, fatigue, and cough following his COVID-19 infection.  He reported two-pillow orthopnea and a 5 pound weight gain despite poor appetite since contracting Covid.  In this setting, he underwent echo in 01/2020, which showed a normal LVEF with grade 1 diastolic  dysfunction and no significant valvular abnormalities.  He was evaluated by pulmonology in 02/2020 who felt like his symptoms were likely due to uncontrolled sleep apnea, GERD, and chronic rhinitis.  PFTs were recommended, and ultimately completed in 11/2020, which showed moderate restrictive lung disease with consideration for ILD.  He was seen by cardiology in 03/2020 noting multiple complaints including a several week history of numbness in his legs and feet that was most pronounced when lying down, which is followed by neurology.  He also noted exertional dyspnea that varied from one day to the next.  In the setting of lower extremity swelling, he had increased his Lasix  to 40 mg daily and decreased his amlodipine  with improvement in symptoms.  He was continued on Lasix  40 mg daily.  His leg pain was most consistent with a neuropathic process though given his risk factors he underwent lower extremity ABIs which were normal.  He was seen in 07/2020 reporting palpitations with ZIO monitor at that demonstrating sinus rhythm with rare PACs and PVCs with no sustained arrhythmia or prolonged pauses.    He was admitted to the hospital in 03/2022 with a bowel obstruction with emergent surgery and PE status post thrombectomy.  During that admission, echo demonstrated an EF of 60 to 65%, no regional wall motion abnormalities, grade 2 diastolic dysfunction, normal RV systolic function with mildly enlarged ventricular cavity size, and aortic valve sclerosis without evidence of stenosis.   He was seen in the office in 07/2022 and was largely recovered from his admission in the spring.  However, he did feel more  short of breath and reported intermittent chest tightness with exertion.  Given symptoms, he underwent echo in 08/2022 which showed an EF of 60 to 65%, no regional wall motion abnormalities, mild LVH, grade 2 diastolic dysfunction, normal RV systolic function and ventricular cavity size, no significant valvular  abnormalities, and an estimated right atrial pressure of 3 mmHg.  Lexiscan  MPI in 08/2022 showed no evidence of ischemia.   He was seen in the office on 10/21/2022 and noted an increase in palpitation burden occurring on an almost daily basis and lasting 5 to 10 seconds in duration.  Zio patch showed a predominant rhythm of sinus with an average rate of 66 bpm (range 47 to 128 bpm, 1 run of SVT lasting 4 beats with a maximum rate of 128 bpm, 1 pause occurred lasting 3.3 seconds at 7:48 AM (Sunday, 11/07/2022) and was asymptomatic, and rare atrial/ventricular ectopy.  Patient triggered events corresponded to sinus rhythm and PVCs.   He was seen in the office in 11/2022 and noted improvement in palpitation burden.  He reported intermittent unsteadiness with recommendation to reduce carvedilol  to 6.25 mg twice daily.  He was felt to be acceptable risk for noncardiac shoulder surgery.   He was seen in the office in 03/2023 and continued to note intermittent palpitations described as "heart pounding."  Symptoms were worse with reduction of carvedilol  leading him to titrate carvedilol  back up to 12.5 mg twice daily.  Exertional fatigue and shortness of breath were stable.  He reported to frequently missing atorvastatin  secondary to "brain fog and myalgias."  In this setting, it was recommended he undergo a trial of Praluent  to reduce ASCVD risk, however his insurance declined coverage.   He was seen in the office in 11/2023 and continued to note exertional fatigue and dyspnea.  He underwent myocardial PET/CT on 12/22/2023 that showed no evidence of ischemia or infarction, preserved LV systolic function at rest and with stress, and was overall low risk/normal.  Multivessel coronary artery calcification noted on CT attenuation corrected imaging.   He was seen in the ED on 02/17/2024 with concern for elevated blood pressure in the 160s to 180s.  Blood pressure in the ED 124/92.  High-sensitivity troponin negative x 2.   BNP 36.  Chest x-ray without active cardiopulmonary process.  EKG showed sinus no acute ST-T changes.  There was concern for some lower extremity swelling with recommendation for him to take Lasix  40 mg daily for the next 3 days and follow-up as an outpatient.  He was last seen in the office in 02/2024 continuing to note fatigue as well as intermittent palpitations.  He also reported some elevated BP readings that had since improved without intervention.  He had taken one dose of Praluent .  He comes in today and is without symptoms of angina or cardiac decompensation.  Continues to note fluctuations in blood pressure largely ranging from the 150s to 180s systolic.  He is undergoing evaluation for Inspire device through [facility] for management of sleep apnea given difficulty with CPAP as well as chronic slow transit constipation through GI at [facility].  Does continue to note sock indentation along his lower extremities after being up on his feet at work.  Has not taken as needed of furosemide .   Labs independently reviewed: 01/2024 - Hgb 15.3, PLT 334, potassium 4.2, BUN 13, serum creatinine 0.94, TSH normal, A1c 5.9, TC 194, TG 177, HDL 36, LDL 123, albumin 3.9, AST/ALT normal   Past Medical History:  Diagnosis Date  Complication of anesthesia    a.) delayed emergence   Coronary artery disease, non-occlusive    a. 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) etiology felt to be related to XRT; "two hours after receiving bilateral orbital radiation patient was noted to have increased swelling of the orbital area which progressed to include the cheek and lips, an intense itching of the eyes bilaterally";  reaction resulted in intubation (06/27/2014 - 07/02/2014)   Insomnia    a.) on prescribed hypnotics (zolpidem  + eszopiclone )   Labile hypertension    Left rotator cuff tear    Long term current use of anticoagulant    a.) apixaban    Medial epicondylitis of right elbow    Meralgia paresthetica of right side    Microscopic hematuria    Myalgia due to statin    Ocular migraine    OSA on CPAP    Pneumonia    Prediabetes    PSVT (paroxysmal supraventricular tachycardia) (HCC)    Pulmonary emboli (HCC) 03/28/2022   a. assoicated (+) RIGHT heart strain; required mechanical thrombectomy   RLS (restless legs syndrome)    a.) on ropinirole    Splenic vein thrombosis    a.) treated with enoxaparin  BID x 6 months    Past Surgical History:  Procedure Laterality Date   BRONCHIAL WASHINGS N/A 08/21/2021   Procedure: BRONCHIAL WASHINGS;  Surgeon: Erskin Hearing, MD;  Location: [facility] OR[facility];  Service: Thoracic;  Laterality: N/A;   COLONOSCOPY     CORONARY PRESSURE/FFR STUDY N/A 12/07/2017   Procedure: INTRAVASCULAR PRESSURE WIRE/FFR STUDY;  Surgeon: Sammy Crisp, MD;  Location: [facility];  Service: Cardiovascular;  Laterality: N/A;   FLEXIBLE BRONCHOSCOPY N/A 08/21/2021   Procedure: FLEXIBLE BRONCHOSCOPY;  Surgeon: Erskin Hearing,  MD;  Location: [facility];  Service: Thoracic;  Laterality: N/A;   LEFT HEART CATH AND CORONARY ANGIOGRAPHY Left 12/07/2017   Procedure: LEFT HEART CATH AND CORONARY ANGIOGRAPHY;  Surgeon: Sammy Crisp, MD;  Location: [facility];  Service: Cardiovascular;  Laterality: Left;   nissen funduplication  2009   [name]   PULMONARY THROMBECTOMY N/A 03/29/2022   Procedure: PULMONARY THROMBECTOMY;  Surgeon: Celso College, MD;  Location: [facility];  Service: Cardiovascular;  Laterality: N/A;   SEPTOPLASTY Bilateral 08/23/2019   Procedure: SEPTOPLASTY;  Surgeon: Mellody Sprout, MD;  Location: [facility];  Service: ENT;  Laterality: Bilateral;   SHOULDER ARTHROSCOPY Left 02/22/2023  Procedure: ARTHROSCOPY SHOULDER;  Surgeon: Rande Bushy, MD;  Location: [facility];  Service: Orthopedics;  Laterality: Left;   SHOULDER ARTHROSCOPY WITH OPEN ROTATOR CUFF REPAIR AND DISTAL CLAVICLE ACROMINECTOMY Left 02/22/2023   Procedure: SHOULDER ARTHROSCOPY WITH OPEN ROTATOR CUFF REPAIR, DISTAL CLAVICLE ACROMINECTOMY, SUBACROMIAL DECOMPRESSION, DISTAL CLAVICLE EXCISION, AND BICEPS TENODESIS;  Surgeon: Rande Bushy, MD;  Location: [facility];  Service: Orthopedics;  Laterality: Left;   SPLENECTOMY     TURBINATE REDUCTION Bilateral 08/23/2019   Procedure: TURBINATE REDUCTION;  Surgeon: Mellody Sprout, MD;  Location: [facility];  Service: ENT;  Laterality: Bilateral;  Latex sensitivity sleep apnea    Current Medications: Current Meds  Medication Sig   Alirocumab  (PRALUENT ) 150 MG/ML SOAJ Inject 1 mL (150 mg total) into the skin every 14 (fourteen) days.   ALPRAZolam  (XANAX ) 0.5 MG tablet Take 1 tablet (0.5 mg total) by mouth 2 (two) times daily as needed for anxiety.   carboxymethylcellulose (REFRESH PLUS) 0.5 % SOLN Apply to eye as needed (dry eyes).   carvedilol  (COREG ) 25 MG tablet Take 1 tablet (25 mg total) by mouth 2 (two) times daily with a meal.   cyanocobalamin  (VITAMIN  B12) 1000 MCG/ML injection INJECT INTO THE MUSCLE EVERY 30 DAYS   ELIQUIS  5 MG TABS tablet TAKE ONE TABLET BY MOUTH TWICE A DAY   Eszopiclone  3 MG TABS Take 1 tablet (3 mg total) by mouth at bedtime as needed.   ezetimibe  (ZETIA ) 10 MG tablet Take 1 tablet (10 mg total) by mouth every morning.   famotidine  (PEPCID ) 20 MG tablet Take 20 mg by mouth 2 (two) times daily.   furosemide  (LASIX ) 40 MG tablet Take 40 mg by mouth as needed.   hydrochlorothiazide  (MICROZIDE ) 12.5 MG capsule Take 1 capsule (12.5 mg total) by mouth daily.   losartan  (COZAAR ) 100 MG tablet Take 1 tablet (100 mg total) by mouth daily.   Multiple Vitamins-Minerals (MULTIVITAMIN WITH MINERALS) tablet Take 1 tablet by mouth daily.   omeprazole (PRILOSEC) 40 MG capsule Take 40 mg by mouth every morning.   ondansetron  (ZOFRAN ) 4 MG tablet Take 1 tablet (4 mg total) by mouth every 8 (eight) hours as needed.   ONETOUCH VERIO test strip USE TO CHECK BLOOD SUGAR AS NEEDED   Prucalopride Succinate (MOTEGRITY) 2 MG TABS Take 2 mg by mouth daily as needed (constipation).   pyridostigmine (MESTINON) 60 MG tablet Take 60 mg by mouth 3 (three) times daily.   rOPINIRole  (REQUIP ) 0.25 MG tablet Take 0.25 mg by mouth in the morning and at bedtime.    Syringe/Needle, Disp, (SYRINGE 3CC/25GX1") 25G X 1" 3 ML MISC Use for b12 injections   tiZANidine  (ZANAFLEX ) 4 MG tablet TAKE ONE-HALF TO ONE TABLET BY MOUTH AT BEDTIME AS NEEDED FOR MUSCLE SPASM   triamcinolone  (NASACORT ) 55 MCG/ACT AERO nasal inhaler Place 2 sprays into the nose every 8 (eight) hours as needed.   zolpidem  (AMBIEN  CR) 6.25 MG CR tablet TAKE TWO TABLETS BY MOUTH AT BEDTIME AS NEEDED FOR SLEEP    Allergies:   Avelox [moxifloxacin], Gadolinium derivatives, Contrast media [iodinated contrast media], Crestor  [rosuvastatin  calcium ], Morphine and codeine, Niacin, Niacin-simvastatin er, Latex, Tape, Tapentadol, and Wound dressing adhesive   Social History   Socioeconomic History    Marital status: Married    Spouse name: Not on file   Number of children: 2   Years of education: 14   Highest education level: Not on file  Occupational History   Occupation: Shop [employer]  Employer: [employer]    Comment: [employer]  Tobacco Use   Smoking status: Never    Passive exposure: Past   Smokeless tobacco: Never  Vaping Use   Vaping status: Never Used  Substance and Sexual Activity   Alcohol use: No   Drug use: No   Sexual activity: Yes  Other Topics Concern   Not on file  Social History Narrative   Married   Works    Social Drivers of Corporate investment banker Strain: Low Risk  (11/23/2023)   Overall Financial Resource Strain (CARDIA)    Difficulty of Paying Living Expenses: Not hard at all  Food Insecurity: No Food Insecurity (11/23/2023)   Hunger Vital Sign    Worried About Running Out of Food in the Last Year: Never true    Ran Out of Food in the Last Year: Never true  Transportation Needs: No Transportation Needs (11/23/2023)   PRAPARE - Administrator, Civil Service (Medical): No    Lack of Transportation (Non-Medical): No  Physical Activity: Inactive (11/23/2023)   Exercise Vital Sign    Days of Exercise per Week: 0 days    Minutes of Exercise per Session: 0 min  Stress: No Stress Concern Present (11/23/2023)   [employer] of Occupational Health - Occupational Stress Questionnaire    Feeling of Stress : Not at all  Social Connections: Moderately Integrated (11/23/2023)   Social Connection and Isolation Panel [NHANES]    Frequency of Communication with Friends and Family: More than three times a week    Frequency of Social Gatherings with Friends and Family: More than three times a week    Attends Religious Services: More than 4 times per year    Active Member of [employer] or Organizations: No    Attends Engineer, structural: Never    Marital Status: Married     Family History:  The patient's family history includes  Aortic aneurysm (age of onset: 76) in his father; COPD in his mother; Cancer in his mother; Coronary artery disease (age of onset: 19) in his father; Hyperlipidemia in his father; Hypertension in his mother; Lung cancer in his mother; Stomach cancer in his maternal grandfather.  ROS:   12-point review of systems is negative unless otherwise noted in the HPI.   EKGs/Labs/Other Studies Reviewed:    Studies reviewed were summarized above. The additional studies were reviewed today: As above.   EKG:  EKG is not ordered today.    Recent Labs: 02/07/2024: TSH 1.42 02/17/2024: ALT 18; B Natriuretic Peptide 36.4; BUN 13; Creatinine, Ser 0.94; Hemoglobin 15.3; Platelets 334; Potassium 4.2; Sodium 142  Recent Lipid Panel    Component Value Date/Time   CHOL 194 02/02/2024 1748   TRIG 177 (H) 02/02/2024 1748   HDL 36 (L) 02/02/2024 1748   CHOLHDL 5.4 02/02/2024 1748   VLDL 35 02/02/2024 1748   LDLCALC 123 (H) 02/02/2024 1748   LDLCALC 162 (H) 10/24/2020 1608   LDLDIRECT 173.0 01/24/2023 1451    PHYSICAL EXAM:    VS:  BP (!) 143/81   Pulse (!) 57   Ht 6\' 2"  (1.88 m)   Wt 211 lb 12.8 oz (96.1 kg)   SpO2 96%   BMI 27.19 kg/m   BMI: Body mass index is 27.19 kg/m.  Physical Exam Vitals reviewed.  Constitutional:      Appearance: He is well-developed.  HENT:     Head: Normocephalic and atraumatic.  Eyes:  General:        Right eye: No discharge.        Left eye: No discharge.  Cardiovascular:     Rate and Rhythm: Normal rate and regular rhythm.     Heart sounds: Normal heart sounds, S1 normal and S2 normal. Heart sounds not distant. No midsystolic click and no opening snap. No murmur heard.    No friction rub.  Pulmonary:     Effort: Pulmonary effort is normal. No respiratory distress.     Breath sounds: Normal breath sounds. No decreased breath sounds, wheezing, rhonchi or rales.  Chest:     Chest wall: No tenderness.  Musculoskeletal:     Cervical back: Normal range of  motion.  Skin:    General: Skin is warm and dry.     Nails: There is no clubbing.  Neurological:     Mental Status: He is alert and oriented to person, place, and time.  Psychiatric:        Speech: Speech normal.        Behavior: Behavior normal.        Thought Content: Thought content normal.        Judgment: Judgment normal.     Wt Readings from Last 3 Encounters:  05/07/24 211 lb 12.8 oz (96.1 kg)  03/06/24 211 lb 12.8 oz (96.1 kg)  02/17/24 216 lb 0.8 oz (98 kg)     ASSESSMENT & PLAN:   Nonobstructive CAD: No symptoms suggestive of angina or cardiac decompensation.  Prior LHC and coronary CTA showed nonobstructive disease. Lexiscan  MPI in 08/2022 showed no evidence of ischemia. Myocardial PET/CT in 12/2023 showed no evidence of ischemia. No further ischemic cardiac testing indicated at this time. If symptoms return would recommend pursuing pulmonary evaluation and/or CPX. Continue aggressive risk factor modification and primary prevention, including apixaban  in place of aspirin  given history of recurrent DVT along with Praluent , ezetimibe , carvedilol , and as needed SL NTG.   Recurrent DVT/PE: Remains on indefinite apixaban .  Managed by PCP.  No evidence of RV dysfunction on prior echo.  HTN: Blood pressure continues to run on the higher side.  HCTZ 12.5 mg daily with a follow-up BMP 1 week thereafter.  He will otherwise continue carvedilol  25 mg twice daily and losartan  100 mg daily.  Difficulty with CPAP with underlying OSA likely contributing to elevated BP readings.  HLD with statin intolerance: LDL 123 in 01/2024.  Has myalgias associated with simvastatin, rosuvastatin , and atorvastatin .  Now on Praluent  and tolerating well along with ezetimibe .  Follow-up lipid panel in 1 week, to be drawn at time of BMP.  OSA: Having difficulty with CPAP.  Undergoing evaluation for Inspire through [facility].     Disposition: F/u with Dr. Nolan Battle or an APP in 2 months.   Medication Adjustments/Labs  and Tests Ordered: Current medicines are reviewed at length with the patient today.  Concerns regarding medicines are outlined above. Medication changes, Labs and Tests ordered today are summarized above and listed in the Patient Instructions accessible in Encounters.   Signed, Varney Gentleman, PA-C 05/07/2024 5:29 PM     [facility] [place] [address] [place], [place] [address] [phone]

## 2024-05-10 ENCOUNTER — Encounter: Payer: Self-pay | Admitting: Dermatology

## 2024-05-10 ENCOUNTER — Ambulatory Visit (INDEPENDENT_AMBULATORY_CARE_PROVIDER_SITE_OTHER): Admitting: Dermatology

## 2024-05-10 DIAGNOSIS — W57XXXA Bitten or stung by nonvenomous insect and other nonvenomous arthropods, initial encounter: Secondary | ICD-10-CM

## 2024-05-10 DIAGNOSIS — W908XXA Exposure to other nonionizing radiation, initial encounter: Secondary | ICD-10-CM

## 2024-05-10 DIAGNOSIS — L820 Inflamed seborrheic keratosis: Secondary | ICD-10-CM

## 2024-05-10 DIAGNOSIS — L578 Other skin changes due to chronic exposure to nonionizing radiation: Secondary | ICD-10-CM

## 2024-05-10 DIAGNOSIS — Z79899 Other long term (current) drug therapy: Secondary | ICD-10-CM

## 2024-05-10 DIAGNOSIS — Z5111 Encounter for antineoplastic chemotherapy: Secondary | ICD-10-CM | POA: Diagnosis not present

## 2024-05-10 DIAGNOSIS — L570 Actinic keratosis: Secondary | ICD-10-CM | POA: Diagnosis not present

## 2024-05-10 DIAGNOSIS — Z87828 Personal history of other (healed) physical injury and trauma: Secondary | ICD-10-CM

## 2024-05-10 DIAGNOSIS — Z7189 Other specified counseling: Secondary | ICD-10-CM

## 2024-05-10 DIAGNOSIS — L821 Other seborrheic keratosis: Secondary | ICD-10-CM

## 2024-05-10 MED ORDER — FLUOROURACIL 5 % EX CREA: TOPICAL_CREAM | Freq: Two times a day (BID) | CUTANEOUS | 0 refills | Status: DC

## 2024-05-10 MED ORDER — DOXYCYCLINE MONOHYDRATE 100 MG PO TABS: 100.0000 mg | ORAL_TABLET | Freq: Two times a day (BID) | ORAL | 0 refills | Status: AC

## 2024-05-10 NOTE — Patient Instructions (Addendum)
 [facility] [address] [place], [place] [address]  Phone: [phone] TOLL-FREE: [phone]  Starting July 5th - - Start 5-fluorouracil/calcipotriene cream twice a day for 5-7 days to affected areas including left temple. Prescription sent to [facility]. Patient advised they will receive a phone call to purchase the medication online and have it sent to their home. Patient provided with handout reviewing treatment course and side effects and advised to call or message us  on MyChart with any concerns.  Reviewed course of treatment and expected reaction.  Patient advised to expect inflammation and crusting and advised that erosions are possible.  Patient advised to be diligent with sun protection during and after treatment. Counseled to keep medication out of reach of children and pets.  Doxycycline  should be taken with food to prevent nausea. Do not lay down for 30 minutes after taking. Be cautious with sun exposure and use good sun protection while on this medication. Pregnant women should not take this medication.    Due to recent changes in healthcare laws, you may see results of your pathology and/or laboratory studies on MyChart before the doctors have had a chance to review them. We understand that in some cases there may be results that are confusing or concerning to you. Please understand that not all results are received at the same time and often the doctors may need to interpret multiple results in order to provide you with the best plan of care or course of treatment. Therefore, we ask that you please give us  2 business days to thoroughly review all your results before contacting the office for clarification. Should we see a critical lab result, you will be contacted sooner.   If You Need Anything After Your Visit  If you have any questions or concerns for your doctor, please call our main line at [phone] and press option 4 to reach your  doctor's medical assistant. If no one answers, please leave a voicemail as directed and we will return your call as soon as possible. Messages left after 4 pm will be answered the following business day.   You may also send us  a message via MyChart. We typically respond to MyChart messages within 1-2 business days.  For prescription refills, please ask your pharmacy to contact our office. Our fax number is [phone].  If you have an urgent issue when the clinic is closed that cannot wait until the next business day, you can page your doctor at the number below.    Please note that while we do our best to be available for urgent issues outside of office hours, we are not available 24/7.   If you have an urgent issue and are unable to reach us , you may choose to seek medical care at your doctor's office, retail clinic, urgent care center, or emergency room.  If you have a medical emergency, please immediately call 911 or go to the emergency department.  Pager Numbers  - Dr. Bary Likes: [phone]  - Dr. Annette Barters: [phone]  - Dr. Felipe Horton: [phone]   In the event of inclement weather, please call our main line at [phone] for an update on the status of any delays or closures.  Dermatology Medication Tips: Please keep the boxes that topical medications come in in order to help keep track of the instructions about where and how to use these. Pharmacies typically print the medication instructions only on the boxes and not directly on the medication tubes.   If your medication is too expensive,  please contact our office at [phone] option 4 or send us  a message through MyChart.   We are unable to tell what your co-pay for medications will be in advance as this is different depending on your insurance coverage. However, we may be able to find a substitute medication at lower cost or fill out paperwork to get insurance to cover a needed medication.   If a prior authorization is  required to get your medication covered by your insurance company, please allow us  1-2 business days to complete this process.  Drug prices often vary depending on where the prescription is filled and some pharmacies may offer cheaper prices.  The website www.goodrx.com contains coupons for medications through different pharmacies. The prices here do not account for what the cost may be with help from insurance (it may be cheaper with your insurance), but the website can give you the price if you did not use any insurance.  - You can print the associated coupon and take it with your prescription to the pharmacy.  - You may also stop by our office during regular business hours and pick up a GoodRx coupon card.  - If you need your prescription sent electronically to a different pharmacy, notify our office through [facility] or by phone at [phone] option 4.     Si Usted Necesita Algo Despus de Su Visita  Tambin puede enviarnos un mensaje a travs de Clinical cytogeneticist. Por lo general respondemos a los mensajes de MyChart en el transcurso de 1 a 2 das hbiles.  Para renovar recetas, por favor pida a su farmacia que se ponga en contacto con nuestra oficina. Franz Jacks de fax es [place] [phone].  Si tiene un asunto urgente cuando la clnica est cerrada y que no puede esperar hasta el siguiente da hbil, puede llamar/localizar a su doctor(a) al nmero que aparece a continuacin.   Por favor, tenga en cuenta que aunque hacemos todo lo posible para estar disponibles para asuntos urgentes fuera del horario de [place], no estamos disponibles las 24 horas del da, los 7 [address]  [address] de la [place].   Si tiene un problema urgente y no puede comunicarse con nosotros, puede optar por buscar atencin mdica  en el consultorio de su doctor(a), en una clnica privada, en un centro de atencin urgente o en una sala de emergencias.  Si tiene Engineer, drilling, por favor llame inmediatamente al 911 o vaya a  la sala de emergencias.  Nmeros de bper  - Dr. Bary Likes: [phone]  - Dra. Annette Barters: [phone]  - Dr. Felipe Horton: [phone]   En caso de inclemencias del tiempo, por favor llame a Lajuan Pila principal al [phone] para una actualizacin sobre el [place] de cualquier retraso o cierre.  Consejos para la medicacin en dermatologa: Por favor, guarde las cajas en las que vienen los medicamentos de uso tpico para ayudarle a seguir las instrucciones sobre dnde y cmo usarlos. Las farmacias generalmente imprimen las instrucciones del medicamento slo en las cajas y no directamente en los tubos del [place].   Si su medicamento es muy caro, por favor, pngase en contacto con Bettyjane Brunet llamando al [phone] y presione la opcin 4 o envenos un mensaje a travs de Clinical cytogeneticist.   No podemos decirle cul ser su copago por los medicamentos por adelantado ya que esto es diferente dependiendo de la cobertura de su seguro. Sin embargo, es posible que podamos encontrar un medicamento sustituto a Audiological scientist un formulario para que el seguro  cubra el medicamento que se considera necesario.   Si se requiere una autorizacin previa para que su compaa de seguros [place] su medicamento, por favor permtanos de 1 a 2 das hbiles para completar este proceso.  Los precios de los medicamentos varan con frecuencia dependiendo del Environmental consultant de dnde se surte la receta y alguna farmacias pueden ofrecer precios ms baratos.  El sitio web www.goodrx.com tiene cupones para medicamentos de Health and safety inspector. Los precios aqu no tienen en cuenta lo que podra costar con la ayuda del seguro (puede ser ms barato con su seguro), pero el sitio web puede darle el precio si no utiliz Tourist information centre manager.  - Puede imprimir el cupn correspondiente y llevarlo con su receta a la farmacia.  - Tambin puede pasar por nuestra oficina durante el horario de atencin regular y Education officer, museum una tarjeta de cupones de  GoodRx.  - Si necesita que su receta se enve electrnicamente a una farmacia diferente, informe a nuestra oficina a travs de MyChart de [place] o por telfono llamando al [phone] y presione la opcin 4.

## 2024-05-10 NOTE — Progress Notes (Signed)
 Follow-Up Visit   Subjective  Brian Atkins is a 68 y.o. male who presents for the following: irritated bumps around ankles, left upper arm and recheck ISK at left temple.  Patient's wife found a tick on patient 3 days after he was out in the yard. It has been a week since she removed the tick. Patient reports no symptoms.   The patient has spots, moles and lesions to be evaluated, some may be new or changing and the patient may have concern these could be cancer.  The following portions of the chart were reviewed this encounter and updated as appropriate: medications, allergies, medical history  Review of Systems:  No other skin or systemic complaints except as noted in HPI or Assessment and Plan.  Objective  Well appearing patient in no apparent distress; mood and affect are within normal limits.  A focused examination was performed of the following areas: Arms, ankles, face  Relevant exam findings are noted in the Assessment and Plan.  L lateral bicep x 2, B/L ankles x 20 (22) Erythematous stuck-on, waxy papule or plaque   Assessment & Plan   HX TICK BITE Exam: not examined  Treatment Plan: Start doxycycline  100 mg BID with food x 1 week. Doxycycline  should be taken with food to prevent nausea. Do not lay down for 30 minutes after taking. Be cautious with sun exposure and use good sun protection while on this medication. Pregnant women should not take this medication.   INFLAMED SEBORRHEIC KERATOSIS (22) L lateral bicep x 2, B/L ankles x 20 (22) Symptomatic, irritating, patient would like treated.  Benign-appearing.  Call clinic for new or changing lesions.   Destruction of lesion - L lateral bicep x 2, B/L ankles x 20 (22) Complexity: simple   Destruction method: cryotherapy   Informed consent: discussed and consent obtained   Timeout:  patient name, date of birth, surgical site, and procedure verified Lesion destroyed using liquid nitrogen: Yes   Region frozen until  ice ball extended beyond lesion: Yes   Outcome: patient tolerated procedure well with no complications   Post-procedure details: wound care instructions given   AK (ACTINIC KERATOSIS) Left Temple Actinic keratoses are precancerous spots that appear secondary to cumulative UV radiation exposure/sun exposure over time. They are chronic with expected duration over 1 year. A portion of actinic keratoses will progress to squamous cell carcinoma of the skin. It is not possible to reliably predict which spots will progress to skin cancer and so treatment is recommended to prevent development of skin cancer.  Recommend daily broad spectrum sunscreen SPF 30+ to sun-exposed areas, reapply every 2 hours as needed.  Recommend staying in the shade or wearing long sleeves, sun glasses (UVA+UVB protection) and wide brim hats (4-inch brim around the entire circumference of the hat). Call for new or changing lesions.  BX proven PIGMENTED ACTINIC KERATOSIS. Biopsied 07/11/2015 at [facility] by Adela Michaele Adjutant, MD    ACTINIC DAMAGE WITH PRECANCEROUS ACTINIC KERATOSES Counseling for Topical Chemotherapy Management: Patient exhibits: - Severe, confluent actinic changes with pre-cancerous actinic keratoses that is secondary to cumulative UV radiation exposure over time - Condition that is severe; chronic, not at goal. - diffuse scaly erythematous macules and papules with underlying dyspigmentation - Discussed Prescription "Field Treatment" topical Chemotherapy for Severe, Chronic Confluent Actinic Changes with Pre-Cancerous Actinic Keratoses Field treatment involves treatment of an entire area of skin that has confluent Actinic Changes (Sun/ Ultraviolet light damage) and PreCancerous Actinic Keratoses by method  of PhotoDynamic Therapy (PDT) and/or prescription Topical Chemotherapy agents such as 5-fluorouracil, 5-fluorouracil/calcipotriene, and/or imiquimod.  The purpose is to decrease the number of clinically  evident and subclinical PreCancerous lesions to prevent progression to development of skin cancer by chemically destroying early precancer changes that may or may not be visible.  It has been shown to reduce the risk of developing skin cancer in the treated area. As a result of treatment, redness, scaling, crusting, and open sores may occur during treatment course. One or more than one of these methods may be used and may have to be used several times to control, suppress and eliminate the PreCancerous changes. Discussed treatment course, expected reaction, and possible side effects. - Recommend daily broad spectrum sunscreen SPF 30+ to sun-exposed areas, reapply every 2 hours as needed.  - Staying in the shade or wearing long sleeves, sun glasses (UVA+UVB protection) and wide brim hats (4-inch brim around the entire circumference of the hat) are also recommended. - Call for new or changing lesions.  - Start 5-fluorouracil/calcipotriene cream twice a day for 5 days to affected areas including left temple. Prescription sent to [facility]. Patient advised they will receive a phone call to purchase the medication online and have it sent to their home. Patient provided with handout reviewing treatment course and side effects and advised to call or message us  on MyChart with any concerns.  Reviewed course of treatment and expected reaction.  Patient advised to expect inflammation and crusting and advised that erosions are possible.  Patient advised to be diligent with sun protection during and after treatment. Counseled to keep medication out of reach of children and pets.  Destruction of lesion - Left Temple Complexity: simple   Destruction method: cryotherapy   Informed consent: discussed and consent obtained   Timeout:  patient name, date of birth, surgical site, and procedure verified Lesion destroyed using liquid nitrogen: Yes   Region frozen until ice ball extended beyond  lesion: Yes   Outcome: patient tolerated procedure well with no complications   Post-procedure details: wound care instructions given   CHEMOTHERAPY MANAGEMENT, ENCOUNTER FOR   COUNSELING AND COORDINATION OF CARE   MEDICATION MANAGEMENT   ACTINIC SKIN DAMAGE   INSECT BITE, UNSPECIFIED SITE, INITIAL ENCOUNTER   SEBORRHEIC KERATOSIS    SEBORRHEIC KERATOSIS - Stuck-on, waxy, tan-brown papules and/or plaques  - Benign-appearing - Discussed benign etiology and prognosis. - Observe - Call for any changes   Return in about 4 months (around 09/10/2024) for AK follow up, with Dr. Almeda Aris, RMA, am acting as scribe for Celine Collard, MD .   Documentation: I have reviewed the above documentation for accuracy and completeness, and I agree with the above.  Celine Collard, MD

## 2024-05-15 ENCOUNTER — Other Ambulatory Visit: Payer: Self-pay | Admitting: Internal Medicine

## 2024-05-17 ENCOUNTER — Ambulatory Visit: Payer: Medicare Other | Admitting: Dermatology

## 2024-05-17 NOTE — Telephone Encounter (Signed)
 Refilled: 09/27/2023 Last OV: 02/07/2024 Next OV: not scheduled

## 2024-06-05 ENCOUNTER — Other Ambulatory Visit: Payer: Self-pay | Admitting: Internal Medicine

## 2024-06-05 DIAGNOSIS — I10 Essential (primary) hypertension: Secondary | ICD-10-CM

## 2024-06-13 ENCOUNTER — Other Ambulatory Visit: Payer: Self-pay | Admitting: Internal Medicine

## 2024-06-13 DIAGNOSIS — M19012 Primary osteoarthritis, left shoulder: Secondary | ICD-10-CM

## 2024-06-13 DIAGNOSIS — M25512 Pain in left shoulder: Secondary | ICD-10-CM

## 2024-06-13 DIAGNOSIS — M5030 Other cervical disc degeneration, unspecified cervical region: Secondary | ICD-10-CM

## 2024-06-13 DIAGNOSIS — R519 Headache, unspecified: Secondary | ICD-10-CM

## 2024-06-13 DIAGNOSIS — M542 Cervicalgia: Secondary | ICD-10-CM

## 2024-06-14 ENCOUNTER — Telehealth: Payer: Self-pay | Admitting: Physician Assistant

## 2024-06-14 DIAGNOSIS — I2510 Atherosclerotic heart disease of native coronary artery without angina pectoris: Secondary | ICD-10-CM

## 2024-06-14 DIAGNOSIS — E785 Hyperlipidemia, unspecified: Secondary | ICD-10-CM

## 2024-06-14 MED ORDER — PRALUENT 150 MG/ML ~~LOC~~ SOAJ: 150.0000 mg | SUBCUTANEOUS | 6 refills | Status: AC

## 2024-06-14 NOTE — Telephone Encounter (Signed)
*  STAT* If patient is at the pharmacy, call can be transferred to refill team.   1. Which medications need to be refilled? (please list name of each medication and dose if known) pruvulent   2. Would you like to learn more about the convenience, safety, & potential cost savings by using the [facility]? no     3. Are you open to using the [facility] (Type [facility]. no ).   4. Which pharmacy/location (including street and city if local pharmacy) is medication to be sent to?[facility], [address], [place]   5. Do they need a 30 day or 90 day supply? 90

## 2024-06-15 ENCOUNTER — Ambulatory Visit: Payer: Self-pay | Admitting: Physician Assistant

## 2024-06-15 LAB — LIPID PANEL
Chol/HDL Ratio: 3.7 ratio (ref 0.0–5.0)
Cholesterol, Total: 173 mg/dL (ref 100–199)
HDL: 47 mg/dL (ref >39)
LDL Chol Calc (NIH): 108 mg/dL — ABNORMAL HIGH (ref 0–99)
Triglycerides: 98 mg/dL (ref 0–149)
VLDL Cholesterol Cal: 18 mg/dL (ref 5–40)

## 2024-06-15 LAB — BASIC METABOLIC PANEL WITH GFR
BUN/Creatinine Ratio: 13 (ref 10–24)
BUN: 16 mg/dL (ref 8–27)
CO2: 23 mmol/L (ref 20–29)
Calcium: 9.5 mg/dL (ref 8.6–10.2)
Chloride: 106 mmol/L (ref 96–106)
Creatinine, Ser: 1.24 mg/dL (ref 0.76–1.27)
Glucose: 85 mg/dL (ref 70–99)
Potassium: 5 mmol/L (ref 3.5–5.2)
Sodium: 144 mmol/L (ref 134–144)
eGFR: 64 mL/min/{1.73_m2} (ref >59)

## 2024-07-10 ENCOUNTER — Ambulatory Visit: Attending: Physician Assistant | Admitting: Physician Assistant

## 2024-07-10 ENCOUNTER — Encounter: Payer: Self-pay | Admitting: Physician Assistant

## 2024-07-10 VITALS — BP 149/88 | HR 55 | Ht 74.0 in | Wt 210.5 lb

## 2024-07-10 DIAGNOSIS — Z789 Other specified health status: Secondary | ICD-10-CM | POA: Insufficient documentation

## 2024-07-10 DIAGNOSIS — I700 Atherosclerosis of aorta: Secondary | ICD-10-CM | POA: Diagnosis not present

## 2024-07-10 DIAGNOSIS — G4733 Obstructive sleep apnea (adult) (pediatric): Secondary | ICD-10-CM | POA: Diagnosis present

## 2024-07-10 DIAGNOSIS — Z86711 Personal history of pulmonary embolism: Secondary | ICD-10-CM | POA: Insufficient documentation

## 2024-07-10 DIAGNOSIS — I10 Essential (primary) hypertension: Secondary | ICD-10-CM | POA: Insufficient documentation

## 2024-07-10 DIAGNOSIS — I2510 Atherosclerotic heart disease of native coronary artery without angina pectoris: Secondary | ICD-10-CM | POA: Insufficient documentation

## 2024-07-10 DIAGNOSIS — E785 Hyperlipidemia, unspecified: Secondary | ICD-10-CM | POA: Insufficient documentation

## 2024-07-10 DIAGNOSIS — R42 Dizziness and giddiness: Secondary | ICD-10-CM | POA: Diagnosis present

## 2024-07-10 MED ORDER — IRBESARTAN 300 MG PO TABS: 300.0000 mg | ORAL_TABLET | Freq: Every day | ORAL | 11 refills | Status: DC

## 2024-07-10 MED ORDER — CARVEDILOL 12.5 MG PO TABS: 12.5000 mg | ORAL_TABLET | Freq: Two times a day (BID) | ORAL | 3 refills | Status: DC

## 2024-07-10 NOTE — Progress Notes (Signed)
 Cardiology Office Note    Date:  07/10/2024   ID:  Brian, Atkins [birth year], MRN [id number]  PCP:  Marylynn Verneita CROME, MD  Cardiologist:  Lonni Hanson, MD  Electrophysiologist:  None   Chief Complaint: Follow up  History of Present Illness:   Brian Atkins is a 68 y.o. male with history of nonobstructive CAD by LHC in 11/2017 and coronary CTA in 11/2019, marginal zone B-cell lymphoma, restrictive lung disease, SBO requiring emergent surgery, PE with prior DVT/PE, HTN, HLD with statin intolerance, peripheral neuropathy, sleep apnea on CPAP, and GERD status post Nissen fundoplication who presents for follow-up of CAD.   He was evaluated in 11/2017 for chest pain, palpitations, headaches, and elevated BP.  He subsequently underwent LHC which revealed moderate nonobstructive CAD involving the proximal/mid LAD and mid RCA.  Both lesions were not hemodynamically significant by fractional flow reserve (FFR of LAD 0.84, FFR RCA 0.94).  LVEF greater than 65% with a moderately elevated LVEDP consistent with diastolic dysfunction.  Medical therapy was recommended.  Echo 12/29/17 showed an EF of 60-65%, normal wall motion, normal LV diastolic function, and a mildly dilated LA.  He was seen in 11/2019 with recurrent chest pain and underwent coronary CTA which showed a calcium  score of 496 and was the 86th percentile.  He had mild diffuse nonobstructive CAD with calcified plaque noted in the left main, LAD, LCx, and RCA with a maximum identified stenosis of 25 to 49%.  Medical therapy was recommended.  He was diagnosed with Covid in 12/2019, and did not require hospital admission.  He was seen in 01/2020 continuing to note persistent exertional dyspnea, fatigue, and cough following his COVID-19 infection.  He reported two-pillow orthopnea and a 5 pound weight gain despite poor appetite since contracting Covid.  In this setting, he underwent echo in 01/2020, which showed a normal LVEF with grade 1 diastolic  dysfunction and no significant valvular abnormalities.  He was evaluated by pulmonology in 02/2020 who felt like his symptoms were likely due to uncontrolled sleep apnea, GERD, and chronic rhinitis.  PFTs were recommended, and ultimately completed in 11/2020, which showed moderate restrictive lung disease with consideration for ILD.  He was seen by cardiology in 03/2020 noting multiple complaints including a several week history of numbness in his legs and feet that was most pronounced when lying down, which is followed by neurology.  He also noted exertional dyspnea that varied from one day to the next.  In the setting of lower extremity swelling, he had increased his Lasix  to 40 mg daily and decreased his amlodipine  with improvement in symptoms.  He was continued on Lasix  40 mg daily.  His leg pain was most consistent with a neuropathic process though given his risk factors he underwent lower extremity ABIs which were normal.  He was seen in 07/2020 reporting palpitations with ZIO monitor at that demonstrating sinus rhythm with rare PACs and PVCs with no sustained arrhythmia or prolonged pauses.    He was admitted to the hospital in 03/2022 with a bowel obstruction with emergent surgery and PE status post thrombectomy.  During that admission, echo demonstrated an EF of 60 to 65%, no regional wall motion abnormalities, grade 2 diastolic dysfunction, normal RV systolic function with mildly enlarged ventricular cavity size, and aortic valve sclerosis without evidence of stenosis.   He was seen in the office in 07/2022 and was largely recovered from his admission in the spring.  However, he did feel more  short of breath and reported intermittent chest tightness with exertion.  Given symptoms, he underwent echo in 08/2022 which showed an EF of 60 to 65%, no regional wall motion abnormalities, mild LVH, grade 2 diastolic dysfunction, normal RV systolic function and ventricular cavity size, no significant valvular  abnormalities, and an estimated right atrial pressure of 3 mmHg.  Lexiscan  MPI in 08/2022 showed no evidence of ischemia.   He was seen in the office on 10/21/2022 and noted an increase in palpitation burden occurring on an almost daily basis and lasting 5 to 10 seconds in duration.  Zio patch showed a predominant rhythm of sinus with an average rate of 66 bpm (range 47 to 128 bpm, 1 run of SVT lasting 4 beats with a maximum rate of 128 bpm, 1 pause occurred lasting 3.3 seconds at 7:48 AM (Sunday, 11/07/2022) and was asymptomatic, and rare atrial/ventricular ectopy.  Patient triggered events corresponded to sinus rhythm and PVCs.   He was seen in the office in 11/2022 and noted improvement in palpitation burden.  He reported intermittent unsteadiness with recommendation to reduce carvedilol  to 6.25 mg twice daily.  He was felt to be acceptable risk for noncardiac shoulder surgery.   He was seen in the office in 03/2023 and continued to note intermittent palpitations described as heart pounding.  Symptoms were worse with reduction of carvedilol  leading him to titrate carvedilol  back up to 12.5 mg twice daily.  Exertional fatigue and shortness of breath were stable.  He reported to frequently missing atorvastatin  secondary to brain fog and myalgias.  In this setting, it was recommended he undergo a trial of Praluent  to reduce ASCVD risk, however his insurance declined coverage.   He was seen in the office in 11/2023 and continued to note exertional fatigue and dyspnea.  He underwent myocardial PET/CT on 12/22/2023 that showed no evidence of ischemia or infarction, preserved LV systolic function at rest and with stress, and was overall low risk/normal.  Multivessel coronary artery calcification noted on CT attenuation corrected imaging.   He was seen in the ED on 02/17/2024 with concern for elevated blood pressure in the 160s to 180s.  Blood pressure in the ED 124/92.  High-sensitivity troponin negative x 2.   BNP 36.  Chest x-ray without active cardiopulmonary process.  EKG showed sinus no acute ST-T changes.  There was concern for some lower extremity swelling with recommendation for him to take Lasix  40 mg daily for the next 3 days and follow-up as an outpatient.   He was last seen in the office in 04/2024 and continued to note fluctuations in blood pressure largely ranging from the 150s to 180s systolic.  He was undergoing evaluation for Inspire device through [facility] for management of sleep apnea given difficulty with CPAP as well as chronic slow transit constipation through GI at [facility].  He was started on HCTZ 12.5 mg with continuation of carvedilol  and losartan .  He comes in today and is without symptoms of angina or cardiac decompensation.  He notes chronic mild dizziness/unsteadiness that is exacerbated with quick positional changes.  Not currently checking blood pressure at home.  Palpitation burden is improved.  He has follow-up next month with [facility] for consideration of inspire device.  He does continue to note some sock indentation along his lower extremities after being up on his feet at work.   Labs independently reviewed: 05/2024 - TC 173, TG 98, HDL 47, LDL 108, BUN 16, serum creatinine 1.24, potassium 5.0 01/2024 - Hgb  15.3, PLT 334, TSH normal, A1c 5.9, TC 194, TG 177, HDL 36, LDL 123, albumin 3.9, AST/ALT normal   Past Medical History:  Diagnosis Date   Complication of anesthesia    a.) delayed emergence   Coronary artery disease, non-occlusive    a. 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) etiology felt to be related to XRT; two hours after receiving bilateral orbital radiation patient was noted to have increased swelling of the orbital area which progressed to include the cheek and lips, an intense itching of the eyes bilaterally;  reaction resulted in intubation (06/27/2014 - 07/02/2014)   Insomnia    a.) on prescribed hypnotics (zolpidem  + eszopiclone )   Labile hypertension    Left rotator cuff tear    Long term current use of anticoagulant    a.) apixaban    Medial epicondylitis of right elbow    Meralgia paresthetica of right side    Microscopic hematuria    Myalgia due to statin    Ocular migraine    OSA on CPAP    Pneumonia    Prediabetes    PSVT (paroxysmal supraventricular tachycardia) (HCC)    Pulmonary emboli (HCC) 03/28/2022   a. assoicated (+) RIGHT heart strain; required mechanical thrombectomy   RLS (restless legs syndrome)    a.) on ropinirole    Splenic vein thrombosis    a.) treated with enoxaparin  BID x 6 months    Past Surgical History:  Procedure Laterality Date   BRONCHIAL WASHINGS N/A 08/21/2021   Procedure: BRONCHIAL WASHINGS;  Surgeon: Parris Manna, MD;  Location: [facility] ORS;  Service: Thoracic;  Laterality: N/A;   COLONOSCOPY     CORONARY PRESSURE/FFR STUDY N/A 12/07/2017   Procedure: INTRAVASCULAR PRESSURE WIRE/FFR STUDY;  Surgeon: Mady Bruckner, MD;  Location: [facility] IN[facility]AS[facility] CV  [facility];  Service: Cardiovascular;  Laterality: N/A;   FLEXIBLE BRONCHOSCOPY N/A 08/21/2021   Procedure: FLEXIBLE BRONCHOSCOPY;  Surgeon: Parris Manna, MD;  Location: [facility] ORS;  Service: Thoracic;  Laterality: N/A;   LEFT HEART CATH AND CORONARY ANGIOGRAPHY Left 12/07/2017   Procedure: LEFT HEART CATH AND CORONARY ANGIOGRAPHY;  Surgeon: Mady Bruckner, MD;  Location: [facility] INVASIVE CV LAB;  Service: Cardiovascular;  Laterality: Left;   nissen funduplication  2009   [name]   PULMONARY THROMBECTOMY N/A 03/29/2022   Procedure: PULMONARY THROMBECTOMY;  Surgeon: Marea Selinda RAMAN, MD;  Location: [facility] INV[facility] CV LAB;  Service: Cardiovascular;  Laterality: N/A;  SEPTOPLASTY Bilateral 08/23/2019   Procedure: SEPTOPLASTY;  Surgeon: Edda Mt, MD;  Location: [facility];  Service: ENT;  Laterality: Bilateral;   SHOULDER ARTHROSCOPY Left 02/22/2023   Procedure: ARTHROSCOPY SHOULDER;  Surgeon: Marchia Drivers, MD;  Location: [facility] ORS;  Service: Orthopedics;  Laterality: Left;   SHOULDER ARTHROSCOPY WITH OPEN ROTATOR CUFF REPAIR AND DISTAL CLAVICLE ACROMINECTOMY Left 02/22/2023   Procedure: SHOULDER ARTHROSCOPY WITH OPEN ROTATOR CUFF REPAIR, DISTAL CLAVICLE ACROMINECTOMY, SUBACROMIAL DECOMPRESSION, DISTAL CLAVICLE EXCISION, AND BICEPS TENODESIS;  Surgeon: Marchia Drivers, MD;  Location: [facility] ORS;  Service: Orthopedics;  Laterality: Left;   SPLENECTOMY     TURBINATE REDUCTION Bilateral 08/23/2019   Procedure: TURBINATE REDUCTION;  Surgeon: Edda Mt, MD;  Location: [facility];  Service: ENT;  Laterality: Bilateral;  Latex sensitivity sleep apnea    Current Medications: Current Meds  Medication Sig   Alirocumab  (PRALUENT ) 150 MG/ML SOAJ Inject 1 mL (150 mg total) into the skin every 14 (fourteen) days.   ALPRAZolam  (XANAX ) 0.5 MG tablet Take 1 tablet (0.5 mg total) by mouth 2 (two) times daily as needed for anxiety.   carboxymethylcellulose (REFRESH PLUS) 0.5 % SOLN Apply to eye as  needed (dry eyes).   cyanocobalamin  (VITAMIN B12) 1000 MCG/ML injection INJECT 1ML INTO THE MUSCLE EVERY 30 DAYS   ELIQUIS  5 MG TABS tablet TAKE ONE TABLET BY MOUTH TWICE A DAY   Eszopiclone  3 MG TABS Take 1 tablet (3 mg total) by mouth at bedtime as needed.   ezetimibe  (ZETIA ) 10 MG tablet Take 1 tablet (10 mg total) by mouth every morning.   famotidine  (PEPCID ) 20 MG tablet Take 20 mg by mouth 2 (two) times daily.   hydrochlorothiazide  (MICROZIDE ) 12.5 MG capsule Take 1 capsule (12.5 mg total) by mouth daily.   irbesartan  (AVAPRO ) 300 MG tablet Take 1 tablet (300 mg total) by mouth daily.   Multiple Vitamins-Minerals (MULTIVITAMIN WITH MINERALS) tablet Take 1 tablet by mouth daily.   nitroGLYCERIN  (NITROSTAT ) 0.4 MG SL tablet Place 1 tablet (0.4 mg total) under the tongue every 5 (five) minutes as needed for chest pain. Maximum of 3 doses.   omeprazole (PRILOSEC) 40 MG capsule Take 40 mg by mouth every morning.   ondansetron  (ZOFRAN ) 4 MG tablet Take 1 tablet (4 mg total) by mouth every 8 (eight) hours as needed.   ONETOUCH VERIO test strip USE TO CHECK BLOOD SUGAR AS NEEDED   Prucalopride Succinate (MOTEGRITY) 2 MG TABS Take 2 mg by mouth daily as needed (constipation).   pyridostigmine (MESTINON) 60 MG tablet Take 60 mg by mouth 3 (three) times daily.   rOPINIRole  (REQUIP ) 0.25 MG tablet Take 0.25 mg by mouth in the morning and at bedtime.    Syringe/Needle, Disp, (SYRINGE 3CC/25GX1) 25G X 1 3 ML MISC Use for b12 injections   tiZANidine  (ZANAFLEX ) 4 MG tablet TAKE ONE-HALF TO ONE TABLET BY MOUTH AT BEDTIME AS NEEDED FOR MUSCLE SPASM   triamcinolone  (NASACORT ) 55 MCG/ACT AERO nasal inhaler Place 2 sprays into the nose every 8 (eight) hours as needed.   zolpidem  (AMBIEN  CR) 6.25 MG CR tablet TAKE TWO TABLETS BY MOUTH AT BEDTIME AS NEEDED FOR SLEEP   [DISCONTINUED] carvedilol  (COREG ) 25 MG tablet Take 1 tablet (25 mg total) by mouth 2 (two) times daily with a meal.   [DISCONTINUED] losartan   (COZAAR ) 100 MG tablet TAKE ONE TABLET BY MOUTH ONE TIME DAILY    Allergies:   Avelox [moxifloxacin], Gadolinium derivatives, Contrast media [iodinated contrast media], Crestor  [rosuvastatin  calcium ],  Morphine and codeine, Niacin, Niacin-simvastatin er, Latex, Tape, Tapentadol, and Wound dressing adhesive   Social History   Socioeconomic History   Marital status: Married    Spouse name: Not on file   Number of children: 2   Years of education: 14   Highest education level: Not on file  Occupational History   Occupation: Shop Nurse, mental health: [employer]    Comment: [employer]  Tobacco Use   Smoking status: Never    Passive exposure: Past   Smokeless tobacco: Never  Vaping Use   Vaping status: Never Used  Substance and Sexual Activity   Alcohol use: No   Drug use: No   Sexual activity: Yes  Other Topics Concern   Not on file  Social History Narrative   Married   Works    Social Drivers of Corporate investment banker Strain: Low Risk  (11/23/2023)   Overall Financial Resource Strain (CARDIA)    Difficulty of Paying Living Expenses: Not hard at all  Food Insecurity: No Food Insecurity (11/23/2023)   Hunger Vital Sign    Worried About Running Out of Food in the Last Year: Never true    Ran Out of Food in the Last Year: Never true  Transportation Needs: No Transportation Needs (11/23/2023)   PRAPARE - Administrator, Civil Service (Medical): No    Lack of Transportation (Non-Medical): No  Physical Activity: Inactive (11/23/2023)   Exercise Vital Sign    Days of Exercise per Week: 0 days    Minutes of Exercise per Session: 0 min  Stress: No Stress Concern Present (11/23/2023)   [employer] of Occupational Health - Occupational Stress Questionnaire    Feeling of Stress : Not at all  Social Connections: Moderately Integrated (11/23/2023)   Social Connection and Isolation Panel    Frequency of Communication with Friends and Family: More than three times  a week    Frequency of Social Gatherings with Friends and Family: More than three times a week    Attends Religious Services: More than 4 times per year    Active Member of [employer] or Organizations: No    Attends Engineer, structural: Never    Marital Status: Married     Family History:  The patient's family history includes Aortic aneurysm (age of onset: 50) in his father; COPD in his mother; Cancer in his mother; Coronary artery disease (age of onset: 63) in his father; Hyperlipidemia in his father; Hypertension in his mother; Lung cancer in his mother; Stomach cancer in his maternal grandfather.  ROS:   12-point review of systems is negative unless otherwise noted in the HPI.   EKGs/Labs/Other Studies Reviewed:    Studies reviewed were summarized above. The additional studies were reviewed today: As above.   EKG:  EKG is ordered today.  The EKG ordered today demonstrates sinus bradycardia, 55 bpm, LVH, baseline artifact, no acute ST-T changes  Recent Labs: 02/07/2024: TSH 1.42 02/17/2024: ALT 18; B Natriuretic Peptide 36.4; Hemoglobin 15.3; Platelets 334 06/14/2024: BUN 16; Creatinine, Ser 1.24; Potassium 5.0; Sodium 144  Recent Lipid Panel    Component Value Date/Time   CHOL 173 06/14/2024 0804   TRIG 98 06/14/2024 0804   HDL 47 06/14/2024 0804   CHOLHDL 3.7 06/14/2024 0804   CHOLHDL 5.4 02/02/2024 1748   VLDL 35 02/02/2024 1748   LDLCALC 108 (H) 06/14/2024 0804   LDLCALC 162 (H) 10/24/2020 1608   LDLDIRECT 173.0 01/24/2023 1451  PHYSICAL EXAM:    VS:  BP (!) 149/88 (BP Location: Left Arm, Patient Position: Sitting, Cuff Size: Normal)   Pulse (!) 55   Ht 6' 2 (1.88 m)   Wt 210 lb 8 oz (95.5 kg)   SpO2 98%   BMI 27.03 kg/m   BMI: Body mass index is 27.03 kg/m.  Physical Exam Vitals reviewed.  Constitutional:      Appearance: He is well-developed.  HENT:     Head: Normocephalic and atraumatic.  Eyes:     General:        Right eye: No discharge.         Left eye: No discharge.  Cardiovascular:     Rate and Rhythm: Normal rate and regular rhythm.     Heart sounds: Normal heart sounds, S1 normal and S2 normal. Heart sounds not distant. No midsystolic click and no opening snap. No murmur heard.    No friction rub.  Pulmonary:     Effort: Pulmonary effort is normal. No respiratory distress.     Breath sounds: Normal breath sounds. No decreased breath sounds, wheezing, rhonchi or rales.  Chest:     Chest wall: No tenderness.  Musculoskeletal:     Cervical back: Normal range of motion.  Skin:    General: Skin is warm and dry.     Nails: There is no clubbing.  Neurological:     Mental Status: He is alert and oriented to person, place, and time.  Psychiatric:        Speech: Speech normal.        Behavior: Behavior normal.        Thought Content: Thought content normal.        Judgment: Judgment normal.     Wt Readings from Last 3 Encounters:  07/10/24 210 lb 8 oz (95.5 kg)  05/07/24 211 lb 12.8 oz (96.1 kg)  03/06/24 211 lb 12.8 oz (96.1 kg)     Orthostatic vital signs: Lying: 153/92, 58 bpm, dizzy Sitting: 150/91, 56 bpm Standing: 164/90, 55 bpm Standing x3 minutes: 151/94, 58 bpm  ASSESSMENT & PLAN:   Nonobstructive CAD: No symptoms suggestive of angina or cardiac decompensation.  Prior LHC and coronary CTA showed nonobstructive disease.  Lexiscan  MPI in 08/2022 showed no evidence of ischemia.  Myocardial PET/CT in 12/2023 showed no evidence of ischemia.  No indication for further ischemic testing at this time.  Continue aggressive risk factor modification and primary prevention including apixaban  and lieu of aspirin  given history of recurrent DVT along with Praluent , ezetimibe , lower dose carvedilol , and as needed SL NTG.  HTN with dizziness: Blood pressure is mildly elevated in the office today with negative orthostatics.  Query if his dizziness is stemming from beta-blocker use.  In this setting we will reduce carvedilol  to  12.5 mg twice daily in efforts to further management of hypertension we will discontinue losartan  and transition him to irbesartan  300 mg daily.  For now, he remains on HCTZ 12.5 mg daily.  Would prefer to avoid titration of thiazide diuretic.  HLD with statin intolerance: LDL 108 on Praluent  over the past 6 to 8 weeks as well as with ezetimibe .  He has myalgias associated with simvastatin, rosuvastatin , and atorvastatin .  Heart healthy diet encouraged.  If LDL continues to remain above goal moving forward, may need to consider referral to lipid clinic.  Recurrent DVT/PE: Remains on indefinite apixaban .  Managed by PCP.  No evidence of RV dysfunction on prior echo.  OSA: Has  difficulty with CPAP.  Undergoing evaluation for inspire device through [facility].     Disposition: F/u with Dr. Mady or an APP in 1 month.   Medication Adjustments/Labs and Tests Ordered: Current medicines are reviewed at length with the patient today.  Concerns regarding medicines are outlined above. Medication changes, Labs and Tests ordered today are summarized above and listed in the Patient Instructions accessible in Encounters.   Signed, Bernardino Bring, PA-C 07/10/2024 5:13 PM     [facility] [place] [address] [place], [place] [address] [phone]

## 2024-07-10 NOTE — Patient Instructions (Signed)
 Medication Instructions:  Your physician recommends the following medication changes.  STOP TAKING: Losartan   START TAKING: Avapro  300 daily  DECREASE: Coreg  12.5 mg twice daily  *If you need a refill on your cardiac medications before your next appointment, please call your pharmacy*  Lab Work: None ordered at this time  If you have labs (blood work) drawn today and your tests are completely normal, you will receive your results only by: MyChart Message (if you have MyChart) OR A paper copy in the mail If you have any lab test that is abnormal or we need to change your treatment, we will call you to review the results.  Testing/Procedures: None ordered at this time   Follow-Up: At [facility], you and your health needs are our priority.  As part of our continuing mission to provide you with exceptional heart care, our providers are all part of one team.  This team includes your primary Cardiologist (physician) and Advanced Practice Providers or APPs (Physician Assistants and Nurse Practitioners) who all work together to provide you with the care you need, when you need it.  Your next appointment:   1 month(s)  Provider:   You may see Lonni Hanson, MD or Bernardino Bring, PA-C  We recommend signing up for the patient portal called MyChart.  Sign up information is provided on this After Visit Summary.  MyChart is used to connect with patients for Virtual Visits (Telemedicine).  Patients are able to view lab/test results, encounter notes, upcoming appointments, etc.  Non-urgent messages can be sent to your provider as well.   To learn more about what you can do with MyChart, go to [email or web address].

## 2024-08-06 ENCOUNTER — Other Ambulatory Visit: Payer: Self-pay | Admitting: Internal Medicine

## 2024-08-06 ENCOUNTER — Ambulatory Visit: Payer: Medicare Other | Admitting: Internal Medicine

## 2024-08-18 ENCOUNTER — Other Ambulatory Visit: Payer: Self-pay | Admitting: Internal Medicine

## 2024-08-26 NOTE — Progress Notes (Unsigned)
 Cardiology Office Note    Date:  08/27/2024   ID:  Brian Atkins, DOB [birth year], MRN [id number]  PCP:  Marylynn Verneita CROME, MD  Cardiologist:  Lonni Hanson, MD  Electrophysiologist:  None   Chief Complaint: Follow up  History of Present Illness:   Brian Atkins is a 68 y.o. male with history of nonobstructive CAD by LHC in 11/2017 and coronary CTA in 11/2019, marginal zone B-cell lymphoma, restrictive lung disease, SBO requiring emergent surgery, PE with prior DVT/PE, HTN, HLD with statin intolerance, peripheral neuropathy, sleep apnea on CPAP, and GERD status post Nissen fundoplication who presents for follow-up of CAD.   He was evaluated in 11/2017 for chest pain, palpitations, headaches, and elevated BP.  He subsequently underwent LHC which revealed moderate nonobstructive CAD involving the proximal/mid LAD and mid RCA.  Both lesions were not hemodynamically significant by fractional flow reserve (FFR of LAD 0.84, FFR RCA 0.94).  LVEF greater than 65% with a moderately elevated LVEDP consistent with diastolic dysfunction.  Medical therapy was recommended.  Echo 12/29/17 showed an EF of 60-65%, normal wall motion, normal LV diastolic function, and a mildly dilated LA.  He was seen in 11/2019 with recurrent chest pain and underwent coronary CTA which showed a calcium  score of 496 and was the 86th percentile.  He had mild diffuse nonobstructive CAD with calcified plaque noted in the left main, LAD, LCx, and RCA with a maximum identified stenosis of 25 to 49%.  Medical therapy was recommended.  He was diagnosed with Covid in 12/2019, and did not require hospital admission.  He was seen in 01/2020 continuing to note persistent exertional dyspnea, fatigue, and cough following his COVID-19 infection.  He reported two-pillow orthopnea and a 5 pound weight gain despite poor appetite since contracting Covid.  In this setting, he underwent echo in 01/2020, which showed a normal LVEF with grade 1 diastolic  dysfunction and no significant valvular abnormalities.  He was evaluated by pulmonology in 02/2020 who felt like his symptoms were likely due to uncontrolled sleep apnea, GERD, and chronic rhinitis.  PFTs were recommended, and ultimately completed in 11/2020, which showed moderate restrictive lung disease with consideration for ILD.  He was seen by cardiology in 03/2020 noting multiple complaints including a several week history of numbness in his legs and feet that was most pronounced when lying down, which is followed by neurology.  He also noted exertional dyspnea that varied from one day to the next.  In the setting of lower extremity swelling, he had increased his Lasix  to 40 mg daily and decreased his amlodipine  with improvement in symptoms.  He was continued on Lasix  40 mg daily.  His leg pain was most consistent with a neuropathic process though given his risk factors he underwent lower extremity ABIs which were normal.  He was seen in 07/2020 reporting palpitations with ZIO monitor at that demonstrating sinus rhythm with rare PACs and PVCs with no sustained arrhythmia or prolonged pauses.    He was admitted to the hospital in 03/2022 with a bowel obstruction with emergent surgery and PE status post thrombectomy.  During that admission, echo demonstrated an EF of 60 to 65%, no regional wall motion abnormalities, grade 2 diastolic dysfunction, normal RV systolic function with mildly enlarged ventricular cavity size, and aortic valve sclerosis without evidence of stenosis.   He was seen in the office in 07/2022 and was largely recovered from his admission in the spring.  However, he did feel more  short of breath and reported intermittent chest tightness with exertion.  Given symptoms, he underwent echo in 08/2022 which showed an EF of 60 to 65%, no regional wall motion abnormalities, mild LVH, grade 2 diastolic dysfunction, normal RV systolic function and ventricular cavity size, no significant valvular  abnormalities, and an estimated right atrial pressure of 3 mmHg.  Lexiscan  MPI in 08/2022 showed no evidence of ischemia.   He was seen in the office on 10/21/2022 and noted an increase in palpitation burden occurring on an almost daily basis and lasting 5 to 10 seconds in duration.  Zio patch showed a predominant rhythm of sinus with an average rate of 66 bpm (range 47 to 128 bpm, 1 run of SVT lasting 4 beats with a maximum rate of 128 bpm, 1 pause occurred lasting 3.3 seconds at 7:48 AM (Sunday, 11/07/2022) and was asymptomatic, and rare atrial/ventricular ectopy.  Patient triggered events corresponded to sinus rhythm and PVCs.   He was seen in the office in 11/2022 and noted improvement in palpitation burden.  He reported intermittent unsteadiness with recommendation to reduce carvedilol  to 6.25 mg twice daily.  He was felt to be acceptable risk for noncardiac shoulder surgery.   He was seen in the office in 03/2023 and continued to note intermittent palpitations described as heart pounding.  Symptoms were worse with reduction of carvedilol  leading him to titrate carvedilol  back up to 12.5 mg twice daily.  Exertional fatigue and shortness of breath were stable.  He reported to frequently missing atorvastatin  secondary to brain fog and myalgias.  In this setting, it was recommended he undergo a trial of Praluent  to reduce ASCVD risk, however his insurance declined coverage.   He was seen in the office in 11/2023 and continued to note exertional fatigue and dyspnea.  He underwent myocardial PET/CT on 12/22/2023 that showed no evidence of ischemia or infarction, preserved LV systolic function at rest and with stress, and was overall low risk/normal.  Multivessel coronary artery calcification noted on CT attenuation corrected imaging.   He was seen in the ED on 02/17/2024 with concern for elevated blood pressure in the 160s to 180s.  Blood pressure in the ED 124/92.  High-sensitivity troponin negative x 2.   BNP 36.  Chest x-ray without active cardiopulmonary process.  EKG showed sinus no acute ST-T changes.  There was concern for some lower extremity swelling with recommendation for him to take Lasix  40 mg daily for the next 3 days and follow-up as an outpatient.   He was seen in the office in 04/2024 and continued to note fluctuations in blood pressure largely ranging from the 150s to 180s systolic.  He was undergoing evaluation for Inspire device through [facility] for management of sleep apnea given difficulty with CPAP as well as chronic slow transit constipation through GI at [facility].  He was started on HCTZ 12.5 mg with continuation of carvedilol  and losartan .  He was most recently seen in the office in 06/2024 noting chronic mild dizziness and unsteadiness that was exacerbated with quick positional changes.  Palpitation burden was improved.  Given positional dizziness, carvedilol  was reduced to 12.5 mg twice daily.  Losartan  was transitioned to irbesartan  300 mg daily.  He comes in doing well from a cardiac perspective and is without symptoms of angina or cardiac decompensation.  He does continue to note some mild stable dizziness/unsteadiness as well as a slight increase in palpitations on lower dose carvedilol .  Palpitations will typically last for 15 to 20  seconds and spontaneously resolved.  No near-syncope or syncope.  He has been under increased stress lately involving the health of his wife who recently tore her Achilles tendon.  No falls or symptoms concerning for bleeding.  Does continue to note some sock indentation at the end of the workday.   Labs independently reviewed: 05/2024 - TC 173, TG 98, HDL 47, LDL 108, BUN 16, serum creatinine 1.24, potassium 5.0 01/2024 - Hgb 15.3, PLT 334, TSH normal, A1c 5.9, TC 194, TG 177, HDL 36, LDL 123, albumin 3.9, AST/ALT normal   Past Medical History:  Diagnosis Date   Complication of anesthesia    a.) delayed emergence   Coronary artery disease, non-occlusive     a. 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)    a.) stage IV with mets to orbits and spleen; Tx'd with splenectomy + XRT + chemotherapy  with associated angioedema reaction requiring intubation (06/27/2014 - 07/02/2014)   Family history of adverse reaction to anesthesia    a.) delayed emergence in 1st degree relative (mother)   GERD (gastroesophageal reflux disease)    Hemorrhoids    Hiatal hernia    History of 2019 novel coronavirus disease (COVID-19) 12/24/2019   History of 2019 novel coronavirus disease (COVID-19) 12/24/2019   History of kidney stones    Hx of splenectomy    a.) secondary MALT lymphoma   Idiopathic angioedema    a.) etiology felt to be related to XRT; two hours after receiving bilateral orbital radiation patient was noted to have increased swelling of the orbital area which progressed to include the cheek and lips, an intense itching of the eyes bilaterally;  reaction resulted in intubation (06/27/2014 - 07/02/2014)   Insomnia    a.) on prescribed hypnotics (zolpidem  + eszopiclone )   Labile hypertension    Left rotator cuff tear    Long term current use of anticoagulant    a.) apixaban    Medial epicondylitis of right elbow    Meralgia paresthetica of right side    Microscopic hematuria    Myalgia due to statin    Ocular migraine    OSA on CPAP    Pneumonia    Prediabetes    PSVT (paroxysmal supraventricular tachycardia) (HCC)    Pulmonary emboli (HCC) 03/28/2022   a. assoicated (+) RIGHT heart strain; required mechanical thrombectomy   RLS (restless legs syndrome)    a.) on ropinirole     Splenic vein thrombosis    a.) treated with enoxaparin  BID x 6 months    Past Surgical History:  Procedure Laterality Date   BRONCHIAL WASHINGS N/A 08/21/2021   Procedure: BRONCHIAL WASHINGS;  Surgeon: Parris Manna, MD;  Location: [facility];  Service: Thoracic;  Laterality: N/A;   COLONOSCOPY     CORONARY PRESSURE/FFR STUDY N/A 12/07/2017   Procedure: INTRAVASCULAR PRESSURE WIRE/FFR STUDY;  Surgeon: Mady Bruckner, MD;  Location: [facility];  Service: Cardiovascular;  Laterality: N/A;   FLEXIBLE BRONCHOSCOPY N/A 08/21/2021   Procedure: FLEXIBLE BRONCHOSCOPY;  Surgeon: Parris Manna, MD;  Location: [facility];  Service: Thoracic;  Laterality: N/A;  LEFT HEART CATH AND CORONARY ANGIOGRAPHY Left 12/07/2017   Procedure: LEFT HEART CATH AND CORONARY ANGIOGRAPHY;  Surgeon: Mady Bruckner, MD;  Location: [facility];  Service: Cardiovascular;  Laterality: Left;   nissen funduplication  2009   [name]   PULMONARY THROMBECTOMY N/A 03/29/2022   Procedure: PULMONARY THROMBECTOMY;  Surgeon: Marea Selinda RAMAN, MD;  Location: [facility];  Service: Cardiovascular;  Laterality: N/A;   SEPTOPLASTY Bilateral 08/23/2019   Procedure: SEPTOPLASTY;  Surgeon: Edda Mt, MD;  Location: [facility];  Service: ENT;  Laterality: Bilateral;   SHOULDER ARTHROSCOPY Left 02/22/2023   Procedure: ARTHROSCOPY SHOULDER;  Surgeon: Marchia Drivers, MD;  Location: [facility];  Service: Orthopedics;  Laterality: Left;   SHOULDER ARTHROSCOPY WITH OPEN ROTATOR CUFF REPAIR AND DISTAL CLAVICLE ACROMINECTOMY Left 02/22/2023   Procedure: SHOULDER ARTHROSCOPY WITH OPEN ROTATOR CUFF REPAIR, DISTAL CLAVICLE ACROMINECTOMY, SUBACROMIAL DECOMPRESSION, DISTAL CLAVICLE EXCISION, AND BICEPS TENODESIS;  Surgeon: Marchia Drivers, MD;  Location: [facility];  Service: Orthopedics;  Laterality: Left;   SPLENECTOMY     TURBINATE REDUCTION Bilateral 08/23/2019   Procedure: TURBINATE REDUCTION;  Surgeon: Edda Mt, MD;  Location: [facility];  Service: ENT;  Laterality: Bilateral;  Latex sensitivity sleep apnea    Current Medications: Current Meds  Medication Sig   Alirocumab  (PRALUENT ) 150 MG/ML SOAJ Inject 1 mL (150 mg total) into the skin every 14 (fourteen) days.   ALPRAZolam  (XANAX ) 0.5 MG tablet Take 1 tablet (0.5 mg total) by mouth 2 (two) times daily as needed for anxiety.   carboxymethylcellulose (REFRESH PLUS) 0.5 % SOLN Apply to eye as needed (dry eyes).   cyanocobalamin  (VITAMIN B12) 1000 MCG/ML injection INJECT 1ML INTO THE MUSCLE EVERY 30 DAYS   ELIQUIS  5 MG TABS tablet TAKE ONE TABLET BY MOUTH TWICE A DAY   Eszopiclone  3 MG TABS TAKE ONE TABLET BY MOUTH AT BEDTIME AS NEEDED   ezetimibe  (ZETIA ) 10 MG tablet Take 1 tablet (10 mg total) by mouth every morning.   famotidine  (PEPCID ) 20 MG tablet Take 20 mg by mouth 2 (two) times daily.   fluorouracil  (EFUDEX ) 5 % cream Apply topically 2 (two) times daily.   furosemide  (LASIX ) 40 MG tablet Take 40 mg by mouth as needed.   hydrochlorothiazide  (MICROZIDE ) 12.5 MG capsule Take 1 capsule (12.5 mg total) by mouth daily.   Multiple Vitamins-Minerals (MULTIVITAMIN WITH MINERALS) tablet Take 1 tablet by mouth daily.   nitroGLYCERIN  (NITROSTAT ) 0.4 MG SL tablet Place 1 tablet (0.4 mg total) under the tongue every 5 (five) minutes as needed for chest pain. Maximum of 3 doses.   omeprazole (PRILOSEC) 40 MG capsule Take 40 mg by mouth every morning.   ondansetron  (ZOFRAN ) 4 MG tablet Take 1 tablet (4 mg total) by mouth every 8 (eight) hours as needed.   ONETOUCH VERIO test strip USE TO CHECK BLOOD SUGAR AS NEEDED   Prucalopride Succinate (MOTEGRITY) 2 MG TABS Take 2 mg by mouth daily as needed (constipation).   pyridostigmine (MESTINON) 60 MG tablet Take 60 mg by mouth 3 (three) times daily.   rOPINIRole  (REQUIP ) 0.25 MG tablet Take 0.25 mg by mouth in the morning and at bedtime.    Syringe/Needle, Disp, (SYRINGE 3CC/25GX1) 25G X 1 3 ML  MISC Use for b12 injections   tiZANidine  (ZANAFLEX ) 4 MG tablet TAKE ONE-HALF TO ONE TABLET BY MOUTH AT BEDTIME AS NEEDED FOR MUSCLE SPASM   triamcinolone  (NASACORT ) 55 MCG/ACT AERO nasal inhaler Place 2 sprays into the nose  every 8 (eight) hours as needed.   zolpidem  (AMBIEN  CR) 6.25 MG CR tablet TAKE TWO TABLETS BY MOUTH AT BEDTIME AS NEEDED FOR SLEEP   [DISCONTINUED] carvedilol  (COREG ) 12.5 MG tablet Take 1 tablet (12.5 mg total) by mouth 2 (two) times daily with a meal.   [DISCONTINUED] irbesartan  (AVAPRO ) 300 MG tablet Take 1 tablet (300 mg total) by mouth daily.    Allergies:   Avelox [moxifloxacin], Gadolinium derivatives, Contrast media [iodinated contrast media], Crestor  [rosuvastatin  calcium ], Morphine and codeine, Niacin, Niacin-simvastatin er, Latex, Tape, Tapentadol, and Wound dressing adhesive   Social History   Socioeconomic History   Marital status: Married    Spouse name: Not on file   Number of children: 2   Years of education: 14   Highest education level: Not on file  Occupational History   Occupation: Shop Nurse, mental health: [employer]    Comment: IT trainer [employer]  Tobacco Use   Smoking status: Never    Passive exposure: Past   Smokeless tobacco: Never  Vaping Use   Vaping status: Never Used  Substance and Sexual Activity   Alcohol use: No   Drug use: No   Sexual activity: Yes  Other Topics Concern   Not on file  Social History Narrative   Married   Works    Social Drivers of Corporate investment banker Strain: Low Risk  (11/23/2023)   Overall Financial Resource Strain (CARDIA)    Difficulty of Paying Living Expenses: Not hard at all  Food Insecurity: No Food Insecurity (11/23/2023)   Hunger Vital Sign    Worried About Running Out of Food in the Last Year: Never true    Ran Out of Food in the Last Year: Never true  Transportation Needs: No Transportation Needs (11/23/2023)   PRAPARE - Administrator, Civil Service (Medical): No    Lack of  Transportation (Non-Medical): No  Physical Activity: Inactive (11/23/2023)   Exercise Vital Sign    Days of Exercise per Week: 0 days    Minutes of Exercise per Session: 0 min  Stress: No Stress Concern Present (11/23/2023)   [employer] of Occupational Health - Occupational Stress Questionnaire    Feeling of Stress : Not at all  Social Connections: Moderately Integrated (11/23/2023)   Social Connection and Isolation Panel    Frequency of Communication with Friends and Family: More than three times a week    Frequency of Social Gatherings with Friends and Family: More than three times a week    Attends Religious Services: More than 4 times per year    Active Member of [employer] or Organizations: No    Attends Engineer, structural: Never    Marital Status: Married     Family History:  The patient's family history includes Aortic aneurysm (age of onset: 37) in his father; COPD in his mother; Cancer in his mother; Coronary artery disease (age of onset: 66) in his father; Hyperlipidemia in his father; Hypertension in his mother; Lung cancer in his mother; Stomach cancer in his maternal grandfather.  ROS:   12-point review of systems is negative unless otherwise noted in the HPI.   EKGs/Labs/Other Studies Reviewed:    Studies reviewed were summarized above. The additional studies were reviewed today: As above.   EKG:  EKG is not ordered today.    Recent Labs: 02/07/2024: TSH 1.42 02/17/2024: ALT 18; B Natriuretic Peptide 36.4; Hemoglobin 15.3; Platelets 334 06/14/2024: BUN 16; Creatinine, Ser 1.24; Potassium  5.0; Sodium 144  Recent Lipid Panel    Component Value Date/Time   CHOL 173 06/14/2024 0804   TRIG 98 06/14/2024 0804   HDL 47 06/14/2024 0804   CHOLHDL 3.7 06/14/2024 0804   CHOLHDL 5.4 02/02/2024 1748   VLDL 35 02/02/2024 1748   LDLCALC 108 (H) 06/14/2024 0804   LDLCALC 162 (H) 10/24/2020 1608   LDLDIRECT 173.0 01/24/2023 1451    PHYSICAL EXAM:    VS:  BP  118/68 (BP Location: Left Arm, Patient Position: Sitting)   Pulse 67   Ht 6' 2 (1.88 m)   Wt 206 lb (93.4 kg)   SpO2 97%   BMI 26.45 kg/m   BMI: Body mass index is 26.45 kg/m.  Physical Exam Vitals reviewed.  Constitutional:      Appearance: He is well-developed.  HENT:     Head: Normocephalic and atraumatic.  Eyes:     General:        Right eye: No discharge.        Left eye: No discharge.  Cardiovascular:     Rate and Rhythm: Normal rate and regular rhythm.     Heart sounds: Normal heart sounds, S1 normal and S2 normal. Heart sounds not distant. No midsystolic click and no opening snap. No murmur heard.    No friction rub.  Pulmonary:     Effort: Pulmonary effort is normal. No respiratory distress.     Breath sounds: Normal breath sounds. No decreased breath sounds, wheezing, rhonchi or rales.  Musculoskeletal:     Cervical back: Normal range of motion.     Comments: Mild sock indentation along the bilateral lower extremities.  Skin:    General: Skin is warm and dry.     Nails: There is no clubbing.  Neurological:     Mental Status: He is alert and oriented to person, place, and time.  Psychiatric:        Speech: Speech normal.        Behavior: Behavior normal.        Thought Content: Thought content normal.        Judgment: Judgment normal.     Wt Readings from Last 3 Encounters:  08/27/24 206 lb (93.4 kg)  07/10/24 210 lb 8 oz (95.5 kg)  05/07/24 211 lb 12.8 oz (96.1 kg)     ASSESSMENT & PLAN:   Nonobstructive CAD: No symptoms suggestive of angina.  Prior LHC and coronary CTA showed nonobstructive disease. Lexiscan  MPI in 08/2022 showed no evidence of ischemia. Myocardial PET/CT in 12/2023 showed no evidence of ischemia. No indication for further ischemic testing at this time. Continue aggressive risk factor modification and primary prevention including apixaban  and lieu of aspirin  given history of recurrent DVT along with Praluent , ezetimibe , and as needed SL  NTG.  HTN with dizziness/palpitations: Blood pressure is well-controlled in the office today.  He does continue to note some positional dizziness.  In this setting we will reduce irbesartan  to 150 mg daily.  Palpitations are typically noted later on in the day/evening hours.  In this setting we will increase evening dose of carvedilol  to 18.75 mg with continuation of morning dose of carvedilol  at 12.5 mg.  HLD with statin intolerance: Adherent to Praluent  and ezetimibe .  Anticipate follow-up labs at next visit.  Recurrent DVT/PE: Remains on indefinite apixaban .  Managed by PCP.  No evidence of RV dysfunction on prior echo.  OSA: Reasonable to continue working with CPAP at this time.     Disposition:  F/u with Dr. Mady or an APP in 2 months.   Medication Adjustments/Labs and Tests Ordered: Current medicines are reviewed at length with the patient today.  Concerns regarding medicines are outlined above. Medication changes, Labs and Tests ordered today are summarized above and listed in the Patient Instructions accessible in Encounters.   Signed, Bernardino Bring, PA-C 08/27/2024 4:59 PM     [facility] [place] [address] [place], [place] [address] [phone]

## 2024-08-27 ENCOUNTER — Encounter: Payer: Self-pay | Admitting: Physician Assistant

## 2024-08-27 ENCOUNTER — Ambulatory Visit: Attending: Physician Assistant | Admitting: Physician Assistant

## 2024-08-27 VITALS — BP 118/68 | HR 67 | Ht 74.0 in | Wt 206.0 lb

## 2024-08-27 DIAGNOSIS — I10 Essential (primary) hypertension: Secondary | ICD-10-CM | POA: Insufficient documentation

## 2024-08-27 DIAGNOSIS — I2510 Atherosclerotic heart disease of native coronary artery without angina pectoris: Secondary | ICD-10-CM | POA: Diagnosis not present

## 2024-08-27 DIAGNOSIS — R002 Palpitations: Secondary | ICD-10-CM | POA: Insufficient documentation

## 2024-08-27 DIAGNOSIS — Z789 Other specified health status: Secondary | ICD-10-CM | POA: Diagnosis not present

## 2024-08-27 DIAGNOSIS — G4733 Obstructive sleep apnea (adult) (pediatric): Secondary | ICD-10-CM | POA: Insufficient documentation

## 2024-08-27 DIAGNOSIS — Z86711 Personal history of pulmonary embolism: Secondary | ICD-10-CM | POA: Diagnosis present

## 2024-08-27 DIAGNOSIS — R42 Dizziness and giddiness: Secondary | ICD-10-CM | POA: Diagnosis present

## 2024-08-27 DIAGNOSIS — E785 Hyperlipidemia, unspecified: Secondary | ICD-10-CM | POA: Diagnosis present

## 2024-08-27 MED ORDER — CARVEDILOL 12.5 MG PO TABS: ORAL_TABLET | ORAL | 3 refills | Status: AC

## 2024-08-27 MED ORDER — IRBESARTAN 150 MG PO TABS: 150.0000 mg | ORAL_TABLET | Freq: Every day | ORAL | 3 refills | Status: AC

## 2024-08-27 NOTE — Patient Instructions (Signed)
 Medication Instructions:  Your physician recommends the following medication changes.  DECREASE: Irbesartan  150 mg daily  INCREASE: Coreg  (carvedilol ) 18.75 mg (evening dose only)  - please continue 12.5 mg in the morning  *If you need a refill on your cardiac medications before your next appointment, please call your pharmacy*  Lab Work: None ordered at this time   Follow-Up: At [facility], you and your health needs are our priority.  As part of our continuing mission to provide you with exceptional heart care, our providers are all part of one team.  This team includes your primary Cardiologist (physician) and Advanced Practice Providers or APPs (Physician Assistants and Nurse Practitioners) who all work together to provide you with the care you need, when you need it.  Your next appointment:   2 month(s)  Provider:   You may see Lonni Hanson, MD or Bernardino Bring, PA-C

## 2024-09-04 ENCOUNTER — Ambulatory Visit (INDEPENDENT_AMBULATORY_CARE_PROVIDER_SITE_OTHER): Admitting: Dermatology

## 2024-09-04 DIAGNOSIS — Z7189 Other specified counseling: Secondary | ICD-10-CM

## 2024-09-04 DIAGNOSIS — W908XXA Exposure to other nonionizing radiation, initial encounter: Secondary | ICD-10-CM

## 2024-09-04 DIAGNOSIS — Z79899 Other long term (current) drug therapy: Secondary | ICD-10-CM

## 2024-09-04 DIAGNOSIS — L820 Inflamed seborrheic keratosis: Secondary | ICD-10-CM

## 2024-09-04 DIAGNOSIS — L570 Actinic keratosis: Secondary | ICD-10-CM

## 2024-09-04 DIAGNOSIS — L578 Other skin changes due to chronic exposure to nonionizing radiation: Secondary | ICD-10-CM | POA: Diagnosis not present

## 2024-09-04 DIAGNOSIS — Z5111 Encounter for antineoplastic chemotherapy: Secondary | ICD-10-CM

## 2024-09-04 MED ORDER — FLUOROURACIL 5 % EX CREA: TOPICAL_CREAM | Freq: Two times a day (BID) | CUTANEOUS | 0 refills | Status: AC

## 2024-09-04 NOTE — Progress Notes (Unsigned)
 Follow-Up Visit   Subjective  Brian Atkins is a 68 y.o. 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 Biopsied 07/11/2015 at [facility] by Adela Eleanora Hal Charter, MD, LN2 05/10/24, f/u, pt forgot to use 5FU/Calcipotriene cream, check spots R arm, irritating  The following portions of the chart were reviewed this encounter and updated as appropriate: medications, allergies, medical history  Review of Systems:  No other skin or systemic complaints except as noted in HPI or Assessment and Plan.  Objective  Well appearing patient in no apparent distress; mood and affect are within normal limits.  A focused examination was performed of the following areas: Face, right arm  Relevant exam findings are noted in the Assessment and Plan.  L temple x 1 Pink scaly macule R arm x 2 (2) Stuck on waxy paps with erythema  Assessment & Plan   ACTINIC DAMAGE - chronic, secondary to cumulative UV radiation exposure/sun exposure over time - diffuse scaly erythematous macules with underlying dyspigmentation - Recommend daily broad spectrum sunscreen SPF 30+ to sun-exposed areas, reapply every 2 hours as needed.  - Recommend staying in the shade or wearing long sleeves, sun glasses (UVA+UVB protection) and wide brim hats (4-inch brim around the entire circumference of the hat). - Call for new or changing lesions.   AK (ACTINIC KERATOSIS) L temple x 1 BX proven PIGMENTED ACTINIC KERATOSIS. Biopsied 07/11/2015 at [facility] by Adela Eleanora Hal Charter, MD,  LN2 05/10/24  Surgical Pathology                                Case: [id number]                               Authorizing Provider:  Phoebe Eleanora Hal         Collected:           07/11/2015 1049                                    Cardones, MD                                                               Ordering Location:     [facility]           Received:            07/11/2015 1157              Pathologist:           Vinie GORMAN Bologna, MD                                                     Specimen:    Skin, left temple  DIAGNOSIS  A. Left temple, shave of skin:  PIGMENTED ACTINIC KERATOSIS, BIOPSY  A. Left temple, shave of skin:  PIGMENTED ACTINIC KERATOSIS, BIOPSY.   In 1 month start 5FU/Calcipotriene bid for 10 days to L temple  5-fluorouracil /calcipotriene cream is is a type of field treatment used to treat precancers, thin skin cancers, and areas of sun damage. Expected reaction includes irritation and mild inflammation potentially progressing to more severe inflammation including redness, scaling, crusting and open sores/erosions.  If too much irritation occurs, ensure application of only a thin layer and decrease frequency of use to achieve a tolerable level of inflammation. Recommend applying Vaseline ointment to open sores as needed.  Minimize sun exposure while under treatment. Recommend daily broad spectrum sunscreen SPF 30+ to sun-exposed areas, reapply every 2 hours as needed.   Actinic keratoses are precancerous spots that appear secondary to cumulative UV radiation exposure/sun exposure over time. They are chronic with expected duration over 1 year. A portion of actinic keratoses will progress to squamous cell carcinoma of the skin. It is not possible to reliably predict which spots will progress to skin cancer and so treatment is recommended to prevent development of skin cancer.  Recommend daily broad spectrum sunscreen SPF 30+ to sun-exposed areas, reapply every 2 hours as needed.  Recommend staying in the shade or wearing long sleeves, sun glasses (UVA+UVB protection) and wide brim hats (4-inch brim around the entire circumference of the hat). Call for new or changing lesions. Destruction of lesion - L temple x 1 Complexity: simple   Destruction method: cryotherapy   Informed consent: discussed  and consent obtained   Timeout:  patient name, date of birth, surgical site, and procedure verified Lesion destroyed using liquid nitrogen: Yes   Region frozen until ice ball extended beyond lesion: Yes   Outcome: patient tolerated procedure well with no complications   Post-procedure details: wound care instructions given    fluorouracil  (EFUDEX ) 5 % cream - L temple x 1 Apply topically 2 (two) times daily. Bid to left temple for 10 days INFLAMED SEBORRHEIC KERATOSIS (2) R arm x 2 (2) Symptomatic, irritating, patient would like treated. Destruction of lesion - R arm x 2 (2) Complexity: simple   Destruction method: cryotherapy   Informed consent: discussed and consent obtained   Timeout:  patient name, date of birth, surgical site, and procedure verified Lesion destroyed using liquid nitrogen: Yes   Region frozen until ice ball extended beyond lesion: Yes   Outcome: patient tolerated procedure well with no complications   Post-procedure details: wound care instructions given     Return in about 4 months (around 01/04/2025) for AK f/u.  I, Grayce Saunas, RMA, am acting as scribe for Alm Rhyme, MD .   Documentation: I have reviewed the above documentation for accuracy and completeness, and I agree with the above.  Alm Rhyme, MD

## 2024-09-04 NOTE — Patient Instructions (Addendum)
 In 1 month start 5FU/Calcipotriene cream twice a day to left temple for 10 days.  Cream sent to [facility]  Instructions for [facility] Medications  One or more of your medications was sent to the [facility] mail order compounding pharmacy. You will receive an email from them and can purchase the medicine through that link. It will then be mailed to your home at the address you confirmed. If for any reason you do not receive an email from them, please check your spam folder. If you still do not find the email, please let us  know. [facility] phone number is [phone].    Due to recent changes in healthcare laws, you may see results of your pathology and/or laboratory studies on MyChart before the doctors have had a chance to review them. We understand that in some cases there may be results that are confusing or concerning to you. Please understand that not all results are received at the same time and often the doctors may need to interpret multiple results in order to provide you with the best plan of care or course of treatment. Therefore, we ask that you please give us  2 business days to thoroughly review all your results before contacting the office for clarification. Should we see a critical lab result, you will be contacted sooner.   If You Need Anything After Your Visit  If you have any questions or concerns for your doctor, please call our main line at [phone] and press option 4 to reach your doctor's medical assistant. If no one answers, please leave a voicemail as directed and we will return your call as soon as possible. Messages left after 4 pm will be answered the following business day.   You may also send us  a message via MyChart. We typically respond to MyChart messages within 1-2 business days.  For prescription refills, please ask your pharmacy to contact our office. Our fax number is [phone].  If you have an urgent issue when the clinic is closed  that cannot wait until the next business day, you can page your doctor at the number below.    Please note that while we do our best to be available for urgent issues outside of office hours, we are not available 24/7.   If you have an urgent issue and are unable to reach us , you may choose to seek medical care at your doctor's office, retail clinic, urgent care center, or emergency room.  If you have a medical emergency, please immediately call 911 or go to the emergency department.  Pager Numbers  - Dr. Hester: [phone]  - Dr. Jackquline: [phone]  - Dr. Claudene: [phone]   - Dr. Raymund: [phone]  In the event of inclement weather, please call our main line at [phone] for an update on the status of any delays or closures.  Dermatology Medication Tips: Please keep the boxes that topical medications come in in order to help keep track of the instructions about where and how to use these. Pharmacies typically print the medication instructions only on the boxes and not directly on the medication tubes.   If your medication is too expensive, please contact our office at [phone] option 4 or send us  a message through MyChart.   We are unable to tell what your co-pay for medications will be in advance as this is different depending on your insurance coverage. However, we may be able to find a substitute medication at lower cost or fill out  paperwork to get insurance to cover a needed medication.   If a prior authorization is required to get your medication covered by your insurance company, please allow us  1-2 business days to complete this process.  Drug prices often vary depending on where the prescription is filled and some pharmacies may offer cheaper prices.  The website www.goodrx.com contains coupons for medications through different pharmacies. The prices here do not account for what the cost may be with help from insurance (it may be cheaper with your  insurance), but the website can give you the price if you did not use any insurance.  - You can print the associated coupon and take it with your prescription to the pharmacy.  - You may also stop by our office during regular business hours and pick up a GoodRx coupon card.  - If you need your prescription sent electronically to a different pharmacy, notify our office through [facility] or by phone at [phone] option 4.     Si Usted Necesita Algo Despus de Su Visita  Tambin puede enviarnos un mensaje a travs de Clinical cytogeneticist. Por lo general respondemos a los mensajes de MyChart en el transcurso de 1 a 2 das hbiles.  Para renovar recetas, por favor pida a su farmacia que se ponga en contacto con nuestra oficina. Randi lakes de fax es [place] [phone].  Si tiene un asunto urgente cuando la clnica est cerrada y que no puede esperar hasta el siguiente da hbil, puede llamar/localizar a su doctor(a) al nmero que aparece a continuacin.   Por favor, tenga en cuenta que aunque hacemos todo lo posible para estar disponibles para asuntos urgentes fuera del horario de [place], no estamos disponibles las 24 horas del da, los 7 [address]  [address] de la [place].   Si tiene un problema urgente y no puede comunicarse con nosotros, puede optar por buscar atencin mdica  en el consultorio de su doctor(a), en una clnica privada, en un centro de atencin urgente o en una sala de emergencias.  Si tiene Engineer, drilling, por favor llame inmediatamente al 911 o vaya a la sala de emergencias.  Nmeros de bper  - Dr. Hester: [phone]  - Dra. Jackquline: [phone]  - Dr. Claudene: [phone]  - Dra. Kitts: [phone]  En caso de inclemencias del [place], por favor llame a nuestra lnea principal al [phone] para una actualizacin sobre el estado de cualquier retraso o cierre.  Consejos para la medicacin en dermatologa: Por favor, guarde las cajas en las que vienen los medicamentos  de uso tpico para ayudarle a seguir las instrucciones sobre dnde y cmo usarlos. Las farmacias generalmente imprimen las instrucciones del medicamento slo en las cajas y no directamente en los tubos del [place].   Si su medicamento es muy caro, por favor, pngase en contacto con landry rieger llamando al [phone] y presione la opcin 4 o envenos un mensaje a travs de Clinical cytogeneticist.   No podemos decirle cul ser su copago por los medicamentos por adelantado ya que esto es diferente dependiendo de la cobertura de su seguro. Sin embargo, es posible que podamos encontrar un medicamento sustituto a Audiological scientist un formulario para que el seguro cubra el medicamento que se considera necesario.   Si se requiere una autorizacin previa para que su compaa de seguros malta su medicamento, por favor permtanos de 1 a 2 das hbiles para completar este proceso.  Los precios de los medicamentos varan con frecuencia dependiendo del Environmental consultant de dnde se  surte la receta y alguna farmacias pueden ofrecer precios ms baratos.  El sitio web www.goodrx.com tiene cupones para medicamentos de Health and safety inspector. Los precios aqu no tienen en cuenta lo que podra costar con la ayuda del seguro (puede ser ms barato con su seguro), pero el sitio web puede darle el precio si no utiliz Tourist information centre manager.  - Puede imprimir el cupn correspondiente y llevarlo con su receta a la farmacia.  - Tambin puede pasar por nuestra oficina durante el horario de atencin regular y Education officer, museum una tarjeta de cupones de GoodRx.  - Si necesita que su receta se enve electrnicamente a una farmacia diferente, informe a nuestra oficina a travs de MyChart de [place] o por telfono llamando al [phone] y presione la opcin 4.

## 2024-09-05 ENCOUNTER — Encounter: Payer: Self-pay | Admitting: Dermatology

## 2024-09-19 ENCOUNTER — Other Ambulatory Visit: Payer: Self-pay | Admitting: Internal Medicine

## 2024-09-19 DIAGNOSIS — M542 Cervicalgia: Secondary | ICD-10-CM

## 2024-09-19 DIAGNOSIS — M5030 Other cervical disc degeneration, unspecified cervical region: Secondary | ICD-10-CM

## 2024-09-19 DIAGNOSIS — M25512 Pain in left shoulder: Secondary | ICD-10-CM

## 2024-09-19 DIAGNOSIS — R519 Headache, unspecified: Secondary | ICD-10-CM

## 2024-09-19 DIAGNOSIS — M19012 Primary osteoarthritis, left shoulder: Secondary | ICD-10-CM

## 2024-10-25 ENCOUNTER — Telehealth: Payer: Self-pay

## 2024-10-25 NOTE — Telephone Encounter (Signed)
 Copied from CRM #[id number]. Topic: Clinical - Medication Question >> Oct 25, 2024  9:26 AM Carlyon D wrote: Reason for CRM: Stuart calling from [facility] in regards to looks likezolpidem (AMBIEN  CR) 6.25 MG CR tablet  and   Eszopiclone  3 MG TABS pharmacy is asking if the pt is to be on both these meds as they are pretty much the same thing, not something they see pt on both of. Please reach out to Edmond at [facility]. Best call back # [phone].

## 2024-10-25 NOTE — Telephone Encounter (Signed)
 Spoke to Fontanet @ [facility] to boeing ok otp that it is ok to refill both Medications because pt alternates both  per Dr Tullo

## 2024-10-29 ENCOUNTER — Ambulatory Visit: Attending: Physician Assistant | Admitting: Physician Assistant

## 2024-10-29 ENCOUNTER — Encounter: Payer: Self-pay | Admitting: Physician Assistant

## 2024-10-29 VITALS — BP 120/70 | HR 59 | Ht 74.0 in | Wt 210.2 lb

## 2024-10-29 DIAGNOSIS — Z789 Other specified health status: Secondary | ICD-10-CM | POA: Insufficient documentation

## 2024-10-29 DIAGNOSIS — R42 Dizziness and giddiness: Secondary | ICD-10-CM | POA: Diagnosis present

## 2024-10-29 DIAGNOSIS — R002 Palpitations: Secondary | ICD-10-CM | POA: Insufficient documentation

## 2024-10-29 DIAGNOSIS — Z86711 Personal history of pulmonary embolism: Secondary | ICD-10-CM | POA: Insufficient documentation

## 2024-10-29 DIAGNOSIS — I10 Essential (primary) hypertension: Secondary | ICD-10-CM | POA: Diagnosis present

## 2024-10-29 DIAGNOSIS — Z79899 Other long term (current) drug therapy: Secondary | ICD-10-CM | POA: Diagnosis present

## 2024-10-29 DIAGNOSIS — G4733 Obstructive sleep apnea (adult) (pediatric): Secondary | ICD-10-CM | POA: Insufficient documentation

## 2024-10-29 DIAGNOSIS — E785 Hyperlipidemia, unspecified: Secondary | ICD-10-CM | POA: Insufficient documentation

## 2024-10-29 DIAGNOSIS — I2510 Atherosclerotic heart disease of native coronary artery without angina pectoris: Secondary | ICD-10-CM | POA: Diagnosis present

## 2024-10-29 MED ORDER — HYDROCHLOROTHIAZIDE 12.5 MG PO CAPS: 12.5000 mg | ORAL_CAPSULE | Freq: Every day | ORAL | 3 refills | Status: AC

## 2024-10-29 MED ORDER — NITROGLYCERIN 0.4 MG SL SUBL: 0.4000 mg | SUBLINGUAL_TABLET | SUBLINGUAL | 3 refills | Status: DC | PRN

## 2024-10-29 NOTE — Progress Notes (Signed)
 Cardiology Office Note    Date:  10/29/2024   ID:  Rodrigues, Urbanek 09-Jun-1956, MRN [id number]  PCP:  Marylynn Verneita CROME, MD  Cardiologist:  Lonni Hanson, MD  Electrophysiologist:  None   Chief Complaint: Follow up  History of Present Illness:   Brian Atkins is a 68 y.o. male with history of nonobstructive CAD by LHC in 11/2017 and coronary CTA in 11/2019, marginal zone B-cell lymphoma, restrictive lung disease, SBO requiring emergent surgery, PE with prior DVT/PE, HTN, HLD with statin intolerance, peripheral neuropathy, sleep apnea on CPAP, and GERD status post Nissen fundoplication who presents for follow-up of CAD.    He was evaluated in 11/2017 for chest pain, palpitations, headaches, and elevated BP.  He subsequently underwent LHC which revealed moderate nonobstructive CAD involving the proximal/mid LAD and mid RCA.  Both lesions were not hemodynamically significant by fractional flow reserve (FFR of LAD 0.84, FFR RCA 0.94).  LVEF greater than 65% with a moderately elevated LVEDP consistent with diastolic dysfunction.  Medical therapy was recommended.  Echo 12/29/17 showed an EF of 60-65%, normal wall motion, normal LV diastolic function, and a mildly dilated LA.  He was seen in 11/2019 with recurrent chest pain and underwent coronary CTA which showed a calcium  score of 496 and was the 86th percentile.  He had mild diffuse nonobstructive CAD with calcified plaque noted in the left main, LAD, LCx, and RCA with a maximum identified stenosis of 25 to 49%.  Medical therapy was recommended.  He was diagnosed with Covid in 12/2019, and did not require hospital admission.  He was seen in 01/2020 continuing to note persistent exertional dyspnea, fatigue, and cough following his COVID-19 infection.  He reported two-pillow orthopnea and a 5 pound weight gain despite poor appetite since contracting Covid.  In this setting, he underwent echo in 01/2020, which showed a normal LVEF with grade 1 diastolic  dysfunction and no significant valvular abnormalities.  He was evaluated by pulmonology in 02/2020 who felt like his symptoms were likely due to uncontrolled sleep apnea, GERD, and chronic rhinitis.  PFTs were recommended, and ultimately completed in 11/2020, which showed moderate restrictive lung disease with consideration for ILD.  He was seen by cardiology in 03/2020 noting multiple complaints including a several week history of numbness in his legs and feet that was most pronounced when lying down, which is followed by neurology.  He also noted exertional dyspnea that varied from one day to the next.  In the setting of lower extremity swelling, he had increased his Lasix  to 40 mg daily and decreased his amlodipine  with improvement in symptoms.  He was continued on Lasix  40 mg daily.  His leg pain was most consistent with a neuropathic process though given his risk factors he underwent lower extremity ABIs which were normal.  He was seen in 07/2020 reporting palpitations with ZIO monitor at that demonstrating sinus rhythm with rare PACs and PVCs with no sustained arrhythmia or prolonged pauses.    He was admitted to the hospital in 03/2022 with a bowel obstruction with emergent surgery and PE status post thrombectomy.  During that admission, echo demonstrated an EF of 60 to 65%, no regional wall motion abnormalities, grade 2 diastolic dysfunction, normal RV systolic function with mildly enlarged ventricular cavity size, and aortic valve sclerosis without evidence of stenosis.   He was seen in the office in 07/2022 and was largely recovered from his admission in the spring.  However, he did feel  more short of breath and reported intermittent chest tightness with exertion.  Given symptoms, he underwent echo in 08/2022 which showed an EF of 60 to 65%, no regional wall motion abnormalities, mild LVH, grade 2 diastolic dysfunction, normal RV systolic function and ventricular cavity size, no significant valvular  abnormalities, and an estimated right atrial pressure of 3 mmHg.  Lexiscan  MPI in 08/2022 showed no evidence of ischemia.   He was seen in the office on 10/21/2022 and noted an increase in palpitation burden occurring on an almost daily basis and lasting 5 to 10 seconds in duration.  Zio patch showed a predominant rhythm of sinus with an average rate of 66 bpm (range 47 to 128 bpm, 1 run of SVT lasting 4 beats with a maximum rate of 128 bpm, 1 pause occurred lasting 3.3 seconds at 7:48 AM (Sunday, 11/07/2022) and was asymptomatic, and rare atrial/ventricular ectopy.  Patient triggered events corresponded to sinus rhythm and PVCs.   He was seen in the office in 11/2022 and noted improvement in palpitation burden.  He reported intermittent unsteadiness with recommendation to reduce carvedilol  to 6.25 mg twice daily.  He was felt to be acceptable risk for noncardiac shoulder surgery.   He was seen in the office in 03/2023 and continued to note intermittent palpitations described as heart pounding.  Symptoms were worse with reduction of carvedilol  leading him to titrate carvedilol  back up to 12.5 mg twice daily.  Exertional fatigue and shortness of breath were stable.  He reported to frequently missing atorvastatin  secondary to brain fog and myalgias.  In this setting, it was recommended he undergo a trial of Praluent  to reduce ASCVD risk, however his insurance declined coverage.   He was seen in the office in 11/2023 and continued to note exertional fatigue and dyspnea.  He underwent myocardial PET/CT on 12/22/2023 that showed no evidence of ischemia or infarction, preserved LV systolic function at rest and with stress, and was overall low risk/normal.  Multivessel coronary artery calcification noted on CT attenuation corrected imaging.   He was seen in the ED on 02/17/2024 with concern for elevated blood pressure in the 160s to 180s.  Blood pressure in the ED 124/92.  High-sensitivity troponin negative x 2.   BNP 36.  Chest x-ray without active cardiopulmonary process.  EKG showed sinus no acute ST-T changes.  There was concern for some lower extremity swelling with recommendation for him to take Lasix  40 mg daily for the next 3 days and follow-up as an outpatient.   He was seen in the office in 04/2024 and continued to note fluctuations in blood pressure largely ranging from the 150s to 180s systolic.  He was undergoing evaluation for Inspire device through [facility] for management of sleep apnea given difficulty with CPAP as well as chronic slow transit constipation through GI at [facility].  He was started on HCTZ 12.5 mg with continuation of carvedilol  and losartan .   He was seen in the office in 06/2024 noting chronic mild dizziness and unsteadiness that was exacerbated with quick positional changes.  Palpitation burden was improved.  Given positional dizziness, carvedilol  was reduced to 12.5 mg twice daily.  Losartan  was transitioned to irbesartan  300 mg daily.  He was last seen in the office in 08/2024 continuing to note some mild stable dizziness/unsteadiness with slight increase in palpitation on lower dose carvedilol .  Irbesartan  was reduced to 150 mg daily with recommendation to increase evening dose of carvedilol  to 18.75 mg and with continuation of morning  dose at 12.5 mg.  She comes in doing well from a cardiac perspective and is without symptoms of angina or cardiac decompensation.  Chronic mild dizziness and palpitations are stable.  He has continued carvedilol  12.5 mg twice daily due to difficulty in splitting pills.  No near-syncope or syncope.  Continues to work with CPAP.  No falls or symptoms concerning for bleeding.  Mild lower extremity sock indentation is stable and more noticeable if he has been sitting for prolonged time frames.  Overall feels like he is doing well.   Labs independently reviewed: 05/2024 - TC 173, TG 98, HDL 47, LDL 108, BUN 16, serum creatinine 1.24, potassium 5.0 01/2024 - Hgb  15.3, PLT 334, TSH normal, A1c 5.9, TC 194, TG 177, HDL 36, LDL 123, albumin 3.9, AST/ALT normal   Past Medical History:  Diagnosis Date   Complication of anesthesia    a.) delayed emergence   Coronary artery disease, non-occlusive    a. 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)    a.) stage IV with mets to orbits and spleen; Tx'd with splenectomy + XRT + chemotherapy  with associated angioedema reaction requiring intubation (06/27/2014 - 07/02/2014)   Family history of adverse reaction to anesthesia    a.) delayed emergence in 1st degree relative (mother)   GERD (gastroesophageal reflux disease)    Hemorrhoids    Hiatal hernia    History of 2019 novel coronavirus disease (COVID-19) 12/24/2019   History of 2019 novel coronavirus disease (COVID-19) 12/24/2019   History of kidney stones    Hx of splenectomy    a.) secondary MALT lymphoma   Idiopathic angioedema    a.) etiology felt to be related to XRT; two hours after receiving bilateral orbital radiation patient was noted to have increased swelling of the orbital area which progressed to include the cheek and lips, an intense itching of the eyes bilaterally;  reaction resulted in intubation (06/27/2014 - 07/02/2014)   Insomnia    a.) on prescribed hypnotics (zolpidem  + eszopiclone )   Labile hypertension    Left rotator cuff tear    Long term current use of anticoagulant    a.) apixaban    Medial epicondylitis of right elbow    Meralgia paresthetica of right side    Microscopic hematuria    Myalgia due to statin    Ocular migraine    OSA on CPAP    Pneumonia     Prediabetes    PSVT (paroxysmal supraventricular tachycardia)    Pulmonary emboli (HCC) 03/28/2022   a. assoicated (+) RIGHT heart strain; required mechanical thrombectomy   RLS (restless legs syndrome)    a.) on ropinirole    Splenic vein thrombosis    a.) treated with enoxaparin  BID x 6 months    Past Surgical History:  Procedure Laterality Date   BRONCHIAL WASHINGS N/A 08/21/2021   Procedure: BRONCHIAL WASHINGS;  Surgeon: Parris Manna, MD;  Location: [facility];  Service: Thoracic;  Laterality: N/A;   COLONOSCOPY     CORONARY PRESSURE/FFR STUDY N/A 12/07/2017   Procedure: INTRAVASCULAR PRESSURE WIRE/FFR STUDY;  Surgeon:  End, Lonni, MD;  Location: [facility];  Service: Cardiovascular;  Laterality: N/A;   FLEXIBLE BRONCHOSCOPY N/A 08/21/2021   Procedure: FLEXIBLE BRONCHOSCOPY;  Surgeon: Parris Manna, MD;  Location: [facility];  Service: Thoracic;  Laterality: N/A;   LEFT HEART CATH AND CORONARY ANGIOGRAPHY Left 12/07/2017   Procedure: LEFT HEART CATH AND CORONARY ANGIOGRAPHY;  Surgeon: Mady Lonni, MD;  Location: [facility];  Service: Cardiovascular;  Laterality: Left;   nissen funduplication  2009   [name]   PULMONARY THROMBECTOMY N/A 03/29/2022   Procedure: PULMONARY THROMBECTOMY;  Surgeon: Marea Selinda RAMAN, MD;  Location: [facility];  Service: Cardiovascular;  Laterality: N/A;   SEPTOPLASTY Bilateral 08/23/2019   Procedure: SEPTOPLASTY;  Surgeon: Edda Mt, MD;  Location: [facility];  Service: ENT;  Laterality: Bilateral;   SHOULDER ARTHROSCOPY Left 02/22/2023   Procedure: ARTHROSCOPY SHOULDER;  Surgeon: Marchia Drivers, MD;  Location: [facility];  Service: Orthopedics;  Laterality: Left;   SHOULDER ARTHROSCOPY WITH OPEN ROTATOR CUFF REPAIR AND DISTAL CLAVICLE ACROMINECTOMY Left 02/22/2023   Procedure: SHOULDER ARTHROSCOPY WITH OPEN ROTATOR CUFF REPAIR, DISTAL CLAVICLE ACROMINECTOMY, SUBACROMIAL DECOMPRESSION, DISTAL CLAVICLE  EXCISION, AND BICEPS TENODESIS;  Surgeon: Marchia Drivers, MD;  Location: [facility];  Service: Orthopedics;  Laterality: Left;   SPLENECTOMY     TURBINATE REDUCTION Bilateral 08/23/2019   Procedure: TURBINATE REDUCTION;  Surgeon: Edda Mt, MD;  Location: [facility];  Service: ENT;  Laterality: Bilateral;  Latex sensitivity sleep apnea    Current Medications: Current Meds  Medication Sig   Alirocumab  (PRALUENT ) 150 MG/ML SOAJ Inject 1 mL (150 mg total) into the skin every 14 (fourteen) days.   ALPRAZolam  (XANAX ) 0.5 MG tablet Take 1 tablet (0.5 mg total) by mouth 2 (two) times daily as needed for anxiety.   carboxymethylcellulose (REFRESH PLUS) 0.5 % SOLN Apply to eye as needed (dry eyes).   carvedilol  (COREG ) 12.5 MG tablet Take 1 tablet (12.5 mg total) by mouth daily AND 1.5 tablets (18.75 mg total) every evening. - with meals.   cyanocobalamin  (VITAMIN B12) 1000 MCG/ML injection INJECT INTO THE MUSCLE EVERY 30 DAYS   ELIQUIS  5 MG TABS tablet TAKE ONE TABLET BY MOUTH TWICE A DAY   Eszopiclone  3 MG TABS TAKE ONE TABLET BY MOUTH AT BEDTIME AS NEEDED   ezetimibe  (ZETIA ) 10 MG tablet Take 1 tablet (10 mg total) by mouth every morning.   famotidine  (PEPCID ) 20 MG tablet Take 20 mg by mouth 2 (two) times daily.   fluorouracil  (EFUDEX ) 5 % cream Apply topically 2 (two) times daily. Bid to left temple for 10 days   furosemide  (LASIX ) 40 MG tablet Take 40 mg by mouth as needed.   irbesartan  (AVAPRO ) 150 MG tablet Take 1 tablet (150 mg total) by mouth daily.   Multiple Vitamins-Minerals (MULTIVITAMIN WITH MINERALS) tablet Take 1 tablet by mouth daily.   omeprazole (PRILOSEC) 40 MG capsule Take 40 mg by mouth every morning.   ondansetron  (ZOFRAN ) 4 MG tablet Take 1 tablet (4 mg total) by mouth every 8 (eight) hours as needed.   ONETOUCH VERIO test strip USE TO CHECK BLOOD SUGAR AS NEEDED   Prucalopride Succinate (MOTEGRITY) 2 MG TABS Take 2 mg by mouth daily as needed  (constipation).   pyridostigmine (MESTINON) 60 MG tablet Take 60 mg by mouth 3 (three) times daily.   rOPINIRole  (REQUIP ) 0.25 MG tablet Take 0.25 mg by mouth in the morning and at bedtime.    Syringe/Needle, Disp, (SYRINGE  3CC/25GX1) 25G X 1 3 ML MISC Use for b12 injections   tiZANidine  (ZANAFLEX ) 4 MG tablet TAKE ONE-HALF TO ONE TABLET BY MOUTH AT BEDTIME AS NEEDED FOR MUSCLE SPASM   triamcinolone  (NASACORT ) 55 MCG/ACT AERO nasal inhaler Place 2 sprays into the nose every 8 (eight) hours as needed.   zolpidem  (AMBIEN  CR) 6.25 MG CR tablet TAKE TWO TABLETS BY MOUTH AT BEDTIME AS NEEDED FOR SLEEP   [DISCONTINUED] hydrochlorothiazide  (MICROZIDE ) 12.5 MG capsule Take 1 capsule (12.5 mg total) by mouth daily.   [DISCONTINUED] nitroGLYCERIN  (NITROSTAT ) 0.4 MG SL tablet Place 1 tablet (0.4 mg total) under the tongue every 5 (five) minutes as needed for chest pain. Maximum of 3 doses.    Allergies:   Avelox [moxifloxacin], Gadolinium derivatives, Contrast media [iodinated contrast media], Crestor  [rosuvastatin  calcium ], Morphine and codeine, Niacin, Niacin-simvastatin er, Latex, Tape, Tapentadol, and Wound dressing adhesive   Social History   Socioeconomic History   Marital status: Married    Spouse name: Not on file   Number of children: 2   Years of education: 14   Highest education level: Not on file  Occupational History   Occupation: Shop Nurse, Mental Health: [employer]    Comment: [employer]  Tobacco Use   Smoking status: Never    Passive exposure: Past   Smokeless tobacco: Never  Vaping Use   Vaping status: Never Used  Substance and Sexual Activity   Alcohol use: No   Drug use: No   Sexual activity: Yes  Other Topics Concern   Not on file  Social History Narrative   Married   Works    Social Drivers of Corporate Investment Banker Strain: Low Risk  (11/23/2023)   Overall Financial Resource Strain (CARDIA)    Difficulty of Paying Living Expenses: Not hard at all   Food Insecurity: No Food Insecurity (11/23/2023)   Hunger Vital Sign    Worried About Running Out of Food in the Last Year: Never true    Ran Out of Food in the Last Year: Never true  Transportation Needs: No Transportation Needs (11/23/2023)   PRAPARE - Administrator, Civil Service (Medical): No    Lack of Transportation (Non-Medical): No  Physical Activity: Inactive (11/23/2023)   Exercise Vital Sign    Days of Exercise per Week: 0 days    Minutes of Exercise per Session: 0 min  Stress: No Stress Concern Present (11/23/2023)   [employer] of Occupational Health - Occupational Stress Questionnaire    Feeling of Stress : Not at all  Social Connections: Moderately Integrated (11/23/2023)   Social Connection and Isolation Panel    Frequency of Communication with Friends and Family: More than three times a week    Frequency of Social Gatherings with Friends and Family: More than three times a week    Attends Religious Services: More than 4 times per year    Active Member of [employer] or Organizations: No    Attends Engineer, Structural: Never    Marital Status: Married     Family History:  The patient's family history includes Aortic aneurysm (age of onset: 59) in his father; COPD in his mother; Cancer in his mother; Coronary artery disease (age of onset: 79) in his father; Hyperlipidemia in his father; Hypertension in his mother; Lung cancer in his mother; Stomach cancer in his maternal grandfather.  ROS:   12-point review of systems is negative unless otherwise noted in the HPI.  EKGs/Labs/Other Studies Reviewed:    Studies reviewed were summarized above. The additional studies were reviewed today: As above.   EKG:  EKG is not ordered today.   Recent Labs: 02/07/2024: TSH 1.42 02/17/2024: ALT 18; B Natriuretic Peptide 36.4; Hemoglobin 15.3; Platelets 334 06/14/2024: BUN 16; Creatinine, Ser 1.24; Potassium 5.0; Sodium 144  Recent Lipid Panel    Component  Value Date/Time   CHOL 173 06/14/2024 0804   TRIG 98 06/14/2024 0804   HDL 47 06/14/2024 0804   CHOLHDL 3.7 06/14/2024 0804   CHOLHDL 5.4 02/02/2024 1748   VLDL 35 02/02/2024 1748   LDLCALC 108 (H) 06/14/2024 0804   LDLCALC 162 (H) 10/24/2020 1608   LDLDIRECT 173.0 01/24/2023 1451    PHYSICAL EXAM:    VS:  BP 120/70 (BP Location: Left Arm, Patient Position: Sitting, Cuff Size: Normal)   Pulse (!) 59   Ht 6' 2 (1.88 m)   Wt 210 lb 3.2 oz (95.3 kg)   SpO2 98%   BMI 26.99 kg/m   BMI: Body mass index is 26.99 kg/m.  Physical Exam Vitals reviewed.  Constitutional:      Appearance: He is well-developed.  HENT:     Head: Normocephalic and atraumatic.  Eyes:     General:        Right eye: No discharge.        Left eye: No discharge.  Cardiovascular:     Rate and Rhythm: Normal rate and regular rhythm.     Heart sounds: Normal heart sounds, S1 normal and S2 normal. Heart sounds not distant. No midsystolic click and no opening snap. No murmur heard.    No friction rub.  Pulmonary:     Effort: Pulmonary effort is normal. No respiratory distress.     Breath sounds: Normal breath sounds. No decreased breath sounds, wheezing, rhonchi or rales.  Musculoskeletal:     Cervical back: Normal range of motion.     Comments: Trivial bilateral sock indentation.  Skin:    General: Skin is warm and dry.     Nails: There is no clubbing.  Neurological:     Mental Status: He is alert and oriented to person, place, and time.  Psychiatric:        Speech: Speech normal.        Behavior: Behavior normal.        Thought Content: Thought content normal.        Judgment: Judgment normal.     Wt Readings from Last 3 Encounters:  10/29/24 210 lb 3.2 oz (95.3 kg)  08/27/24 206 lb (93.4 kg)  07/10/24 210 lb 8 oz (95.5 kg)     ASSESSMENT & PLAN:   Nonobstructive CAD: No symptoms suggestive of angina.  Prior LHC and coronary CTA showed nonobstructive disease. Lexiscan  MPI in 08/2022 showed no  evidence of ischemia. Myocardial PET/CT in 12/2023 showed no evidence of ischemia. No indication for further ischemic testing at this time.  Continue aggressive risk factor modification and primary prevention including apixaban  in lieu of aspirin  given history of recurrent DVT along with Praluent , ezetimibe , and as needed SL NTG.  No indication for further ischemic testing at this time.  HTN with dizziness/palpitations: Blood pressure is well-controlled in the office today.  He remains on carvedilol  12.5 mg twice daily and HCTZ 12.5 mg.  HLD with statin intolerance: LDL 108.  Has been adherent to Praluent  and ezetimibe .  Check lipid panel and direct LDL.  Target LDL less than 70.  Recurrent  DVT/PE: Remains on indefinite apixaban .  Managed by PCP.  No evidence of RV dysfunction on prior echo.  OSA: CPAP.  Follow-up with sleep medicine.    Disposition: F/u with Dr. Mady or an APP in 6 months.   Medication Adjustments/Labs and Tests Ordered: Current medicines are reviewed at length with the patient today.  Concerns regarding medicines are outlined above. Medication changes, Labs and Tests ordered today are summarized above and listed in the Patient Instructions accessible in Encounters.   Signed, Bernardino Bring, PA-C 10/29/2024 4:39 PM     [facility] [place] [address] [place], [place] [address] [phone]

## 2024-10-29 NOTE — Patient Instructions (Signed)
 Medication Instructions:  Your physician recommends that you continue on your current medications as directed. Please refer to the Current Medication list given to you today.   *If you need a refill on your cardiac medications before your next appointment, please call your pharmacy*  Lab Work: Your provider would like for you to have following labs drawn today Direct LDL and Lipid panel.   If you have labs (blood work) drawn today and your tests are completely normal, you will receive your results only by: MyChart Message (if you have MyChart) OR A paper copy in the mail If you have any lab test that is abnormal or we need to change your treatment, we will call you to review the results.  Follow-Up: At [facility], you and your health needs are our priority.  As part of our continuing mission to provide you with exceptional heart care, our providers are all part of one team.  This team includes your primary Cardiologist (physician) and Advanced Practice Providers or APPs (Physician Assistants and Nurse Practitioners) who all work together to provide you with the care you need, when you need it.  Your next appointment:   6 month(s)  Provider:   You may see Lonni Hanson, MD or Bernardino Bring, PA-C

## 2024-10-30 ENCOUNTER — Ambulatory Visit: Payer: Self-pay | Admitting: Physician Assistant

## 2024-10-30 LAB — LIPID PANEL
Chol/HDL Ratio: 3.9 ratio (ref 0.0–5.0)
Cholesterol, Total: 157 mg/dL (ref 100–199)
HDL: 40 mg/dL (ref >39)
LDL Chol Calc (NIH): 87 mg/dL (ref 0–99)
Triglycerides: 172 mg/dL — ABNORMAL HIGH (ref 0–149)
VLDL Cholesterol Cal: 30 mg/dL (ref 5–40)

## 2024-10-30 LAB — LDL CHOLESTEROL, DIRECT: LDL Direct: 95 mg/dL (ref 0–99)

## 2024-11-22 ENCOUNTER — Telehealth: Payer: Self-pay

## 2024-11-22 NOTE — Telephone Encounter (Signed)
 We received a request for clearance for procedure today from [facility] via fax.  I sent a copy to Dr. Verneita Osmond folder on the S drive and hand-delivered a copy to Harlene Sheldon, CMA.

## 2024-11-23 NOTE — Telephone Encounter (Unsigned)
 Copied from CRM #[id number]. Topic: General - Other >> Nov 23, 2024  8:15 AM Franky GRADE wrote: Reason for CRM: Patient is calling regarding the clearance form sent by [facility], I advised that it was received yesterday and placed in Dr.Tullo's folder. He would like to know if there is anyway to expedite the process as he would like to get in soon for an injection. >> Nov 23, 2024  1:37 PM Franky GRADE wrote: Patient is calling to follow up on his request for the clearance form to be completed, I advised that it has been completed. Patient would like for it to be sent directly to [facility] who schedules the injections. Attention Tobias Fax : [phone] Phone: [phone]

## 2024-11-23 NOTE — Telephone Encounter (Signed)
Form has been placed in red folder.  

## 2024-11-23 NOTE — Telephone Encounter (Unsigned)
 Copied from CRM #[id number]. Topic: General - Other >> Nov 23, 2024  8:15 AM Franky GRADE wrote: Reason for CRM: Patient is calling regarding the clearance form sent by [facility], I advised that it was received yesterday and placed in Dr.Tullo's folder. He would like to know if there is anyway to expedite the process as he would like to get in soon for an injection.

## 2024-11-23 NOTE — Telephone Encounter (Signed)
 Spoke with pt to let him know that the form has been faxed.

## 2024-11-27 ENCOUNTER — Other Ambulatory Visit: Payer: Self-pay | Admitting: Orthopedic Surgery

## 2024-11-27 DIAGNOSIS — M5416 Radiculopathy, lumbar region: Secondary | ICD-10-CM

## 2024-12-05 ENCOUNTER — Ambulatory Visit: Admitting: Internal Medicine

## 2024-12-05 ENCOUNTER — Encounter: Payer: Self-pay | Admitting: Internal Medicine

## 2024-12-05 VITALS — BP 172/82 | HR 59 | Ht 74.0 in | Wt 208.2 lb

## 2024-12-05 DIAGNOSIS — M4316 Spondylolisthesis, lumbar region: Secondary | ICD-10-CM | POA: Diagnosis not present

## 2024-12-05 DIAGNOSIS — G4701 Insomnia due to medical condition: Secondary | ICD-10-CM | POA: Diagnosis not present

## 2024-12-05 DIAGNOSIS — M4722 Other spondylosis with radiculopathy, cervical region: Secondary | ICD-10-CM | POA: Diagnosis not present

## 2024-12-05 DIAGNOSIS — K5901 Slow transit constipation: Secondary | ICD-10-CM

## 2024-12-05 NOTE — Progress Notes (Unsigned)
 Subjective:  Patient ID: Brian Atkins, male    DOB: 02-Mar-1956  Age: 68 y.o. MRN: [id number]  CC: There were no encounter diagnoses.   HPI [name] presents for No chief complaint on file.  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  Pain has been manageable without opioids until recently,  and he completed PT in April.  However he developed  severe LBP with left leg numbness and pain  10 days ago [place]  [place]no other inciting event).   Evaluated at [facility] on Dec 5,   MRI done  at [facility] , noted  spondylolisthesis at L4-5 and collapsed disk at L5-S1 with foraminal stenosis at L4-5 and L5-S1 on the right  .  SABRA  ESI  was done by [facility] in [facility] on Friday Dec 12 with 60% improvement in pain initially,  but despite keeping his activity to a minimum,  the pain returned after 72 hours  while getting out of the car Taking Lyrica currently and using oxycodone  5 mg  Legs have been going to sleep for the past 1.5 years.  Completed PT in April at  [facility] 6 - 7 months ago, minimal relief. 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 2 mL 6   ALPRAZolam  (XANAX ) 0.5 MG tablet Take 1 tablet (0.5 mg total) by mouth 2 (two) times daily as needed for anxiety. 60 tablet 0   carboxymethylcellulose (REFRESH PLUS) 0.5 % SOLN Apply to eye as needed (dry eyes).     carvedilol  (COREG ) 12.5 MG tablet Take 1 tablet (12.5 mg total) by mouth daily AND 1.5 tablets (18.75 mg total) every evening. - with meals. 225 tablet 3   cyanocobalamin  (VITAMIN B12) 1000 MCG/ML injection INJECT 1ML INTO  THE MUSCLE EVERY 30 DAYS 10 mL 0   ELIQUIS  5 MG TABS tablet TAKE ONE TABLET BY MOUTH TWICE A DAY 60 tablet 5   Eszopiclone  3 MG TABS TAKE ONE TABLET BY MOUTH AT BEDTIME AS NEEDED 15 tablet 2   ezetimibe  (ZETIA ) 10 MG tablet Take 1 tablet (10 mg total) by mouth every morning. 90 tablet 3   famotidine  (PEPCID ) 20 MG tablet Take 20 mg by mouth 2 (two) times daily.     fluorouracil  (EFUDEX ) 5 % cream Apply topically 2 (two) times daily. Bid to left temple for 10 days 30 g 0   furosemide  (LASIX ) 40 MG tablet Take 40 mg by mouth as needed.     hydrochlorothiazide  (MICROZIDE ) 12.5 MG capsule Take 1 capsule (12.5 mg total) by mouth daily. 90 capsule 3   irbesartan  (AVAPRO ) 150 MG tablet Take 1 tablet (150 mg total) by mouth daily. 90 tablet 3   Multiple Vitamins-Minerals (MULTIVITAMIN WITH MINERALS) tablet Take 1 tablet by mouth daily.     nitroGLYCERIN  (NITROSTAT ) 0.4 MG SL tablet Place 1 tablet (0.4 mg total) under the tongue every 5 (five) minutes as needed for chest pain. Maximum of 3 doses. 25 tablet 3   omeprazole (PRILOSEC) 40 MG  capsule Take 40 mg by mouth every morning.     ondansetron  (ZOFRAN ) 4 MG tablet Take 1 tablet (4 mg total) by mouth every 8 (eight) hours as needed. 20 tablet 5   ONETOUCH VERIO test strip USE TO CHECK BLOOD SUGAR AS NEEDED 100 strip 2   Prucalopride Succinate (MOTEGRITY) 2 MG TABS Take 2 mg by mouth daily as needed (constipation).     pyridostigmine (MESTINON) 60 MG tablet Take 60 mg by mouth 3 (three) times daily.     rOPINIRole  (REQUIP ) 0.25 MG tablet Take 0.25 mg by mouth in the morning and at bedtime.      Syringe/Needle, Disp, (SYRINGE 3CC/25GX1) 25G X 1 3 ML MISC Use for b12 injections 50 each 0   tiZANidine  (ZANAFLEX ) 4 MG tablet TAKE ONE-HALF TO ONE TABLET BY MOUTH AT BEDTIME AS NEEDED FOR MUSCLE SPASM 30 tablet 2   triamcinolone  (NASACORT ) 55 MCG/ACT AERO nasal inhaler Place 2 sprays into the nose every 8 (eight) hours as needed.     zolpidem  (AMBIEN  CR) 6.25  MG CR tablet TAKE TWO TABLETS BY MOUTH AT BEDTIME AS NEEDED FOR SLEEP 60 tablet 5   No facility-administered medications prior to visit.    Review of Systems;  Patient denies headache, fevers, malaise, unintentional weight loss, skin rash, eye pain, sinus congestion and sinus pain, sore throat, dysphagia,  hemoptysis , cough, dyspnea, wheezing, chest pain, palpitations, orthopnea, edema, abdominal pain, nausea, melena, diarrhea, constipation, flank pain, dysuria, hematuria, urinary  Frequency, nocturia, numbness, tingling, seizures,  Focal weakness, Loss of consciousness,  Tremor, insomnia, depression, anxiety, and suicidal ideation.      Objective:  There were no vitals taken for this visit.  BP Readings from Last 3 Encounters:  10/29/24 120/70  08/27/24 118/68  07/10/24 (!) 149/88    Wt Readings from Last 3 Encounters:  10/29/24 210 lb 3.2 oz (95.3 kg)  08/27/24 206 lb (93.4 kg)  07/10/24 210 lb 8 oz (95.5 kg)    Physical Exam  Lab Results  Component Value Date   HGBA1C 5.9 02/07/2024   HGBA1C 6.0 01/24/2023   HGBA1C 6.0 04/07/2022    Lab Results  Component Value Date   CREATININE 1.24 06/14/2024   CREATININE 0.94 02/17/2024   CREATININE 1.01 06/21/2023    Lab Results  Component Value Date   WBC 7.7 02/17/2024   HGB 15.3 02/17/2024   HCT 46.3 02/17/2024   PLT 334 02/17/2024   GLUCOSE 85 06/14/2024   CHOL 157 10/29/2024   TRIG 172 (H) 10/29/2024   HDL 40 10/29/2024   LDLDIRECT 95 10/29/2024   LDLCALC 87 10/29/2024   ALT 18 02/17/2024   AST 18 02/17/2024   NA 144 06/14/2024   K 5.0 06/14/2024   CL 106 06/14/2024   CREATININE 1.24 06/14/2024   BUN 16 06/14/2024   CO2 23 06/14/2024   TSH 1.42 02/07/2024   PSA 2.93 02/07/2024   INR 1.0 03/28/2022   HGBA1C 5.9 02/07/2024   MICROALBUR 0.5 10/24/2020    DG Chest 2 View Result Date: 02/17/2024 CLINICAL DATA:  Palpitations EXAM: CHEST - 2 VIEW COMPARISON:  Chest radiograph dated 02/07/2023 FINDINGS:  Normal lung volumes. Left basilar linear opacities, likely atelectasis. No pleural effusion or pneumothorax. The heart size and mediastinal contours are within normal limits. No acute osseous abnormality. IMPRESSION: No active cardiopulmonary disease. Electronically Signed   By: Limin  Xu M.D.   On: 02/17/2024 12:25    Assessment & Plan:  .There are no diagnoses linked  to this encounter.   I spent 34 minutes on the day of this face to face encounter reviewing patient's  most recent visit with cardiology,  nephrology,  and neurology,  prior relevant surgical and non surgical procedures, recent  labs and imaging studies, counseling on weight management,  reviewing the assessment and plan with patient, and post visit ordering and reviewing of  diagnostics and therapeutics with patient  .   Follow-up: No follow-ups on file.   Verneita LITTIE Kettering, MD

## 2024-12-05 NOTE — Patient Instructions (Signed)
 I will refill the oxycodone  if needed as well as the prednisone  if needed; just send me a message tomorrow after your meeting with pain managemebt    Referring To [facility]

## 2024-12-07 ENCOUNTER — Other Ambulatory Visit: Payer: Self-pay | Admitting: Internal Medicine

## 2024-12-07 DIAGNOSIS — M4316 Spondylolisthesis, lumbar region: Secondary | ICD-10-CM | POA: Insufficient documentation

## 2024-12-07 MED ORDER — PREDNISONE 10 MG PO TABS: ORAL_TABLET | ORAL | 0 refills | Status: DC

## 2024-12-07 NOTE — Assessment & Plan Note (Signed)
 Secondary to OSA. Alternating between use of lunesta and  ambien CR but not tolerating CPAP .  Considering INSPIRE surgery.

## 2024-12-07 NOTE — Assessment & Plan Note (Addendum)
 With complete herniation of disk between L5 and S1. By MRI done Dec 5 ([facility])   Referral to [facility] in progress as the likelihood of pain management with ESI is low.  Use of opioids chronically will be problematic given his chronic constipation due to slow transit

## 2024-12-07 NOTE — Assessment & Plan Note (Signed)
 Confirmed by transit study y [facility]  Managed with  Motegrity and magnesium

## 2024-12-11 ENCOUNTER — Other Ambulatory Visit: Payer: Self-pay | Admitting: Cardiovascular Disease

## 2024-12-11 ENCOUNTER — Other Ambulatory Visit: Payer: Self-pay | Admitting: Internal Medicine

## 2024-12-11 MED ORDER — NITROGLYCERIN 0.4 MG SL SUBL: 0.4000 mg | SUBLINGUAL_TABLET | SUBLINGUAL | 10 refills | Status: AC | PRN

## 2024-12-26 ENCOUNTER — Encounter: Payer: Self-pay | Admitting: Internal Medicine

## 2024-12-26 DIAGNOSIS — G4733 Obstructive sleep apnea (adult) (pediatric): Secondary | ICD-10-CM

## 2024-12-27 NOTE — Progress Notes (Unsigned)
 "   Referring Physician:  Marylynn Verneita CROME, MD [address] [place],  [place] [address]  Primary Physician:  Marylynn Verneita CROME, MD  History of Present Illness: 12/27/2024 [name] is here today with a chief complaint of ***   Low back pain  Left leg pain and numbness   Duration: *** Location: *** Quality: *** Severity: ***  Precipitating: aggravated by *** Modifying factors: made better by *** Weakness: none Timing: *** Bowel/Bladder Dysfunction: none  Conservative measures:  Physical therapy: *** has participated in at [facility] from 02/07/24 to 04/09/24 (8 visits total), not helpful?? Multimodal medical therapy including regular antiinflammatories: *** tizanidine , lyrica, prednisone , oxycodone   Injections: *** Lumbar ESI at [facility]  Past Surgery: ***no spinal surgeries   Brian Atkins has ***no symptoms of cervical myelopathy.  The symptoms are causing a significant impact on the patient's life.   I have utilized the care everywhere function in epic to review the outside records available from external health systems.   Review of Systems:  A 10 point review of systems is negative, except for the pertinent positives and negatives detailed in the HPI.  Past Medical History: Past Medical History:  Diagnosis Date   Complication of anesthesia    a.) delayed emergence   Coronary artery disease, non-occlusive    a. 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) etiology felt to be related to XRT; two hours after receiving bilateral orbital radiation patient was noted to have increased swelling of the orbital area which progressed to include the cheek and lips, an intense itching of the eyes bilaterally;  reaction resulted in intubation (06/27/2014 - 07/02/2014)   Insomnia    a.) on prescribed hypnotics (zolpidem  + eszopiclone )   Labile hypertension    Left rotator cuff tear    Long term current use of anticoagulant    a.) apixaban    Medial epicondylitis of right elbow    Meralgia paresthetica of right side    Microscopic hematuria    Myalgia due to statin    Ocular migraine    OSA on CPAP    Pneumonia    Prediabetes    PSVT (paroxysmal supraventricular tachycardia)    Pulmonary emboli (HCC) 03/28/2022   a. assoicated (+) RIGHT heart strain; required mechanical thrombectomy   RLS (restless legs syndrome)    a.) on ropinirole    Sleep apnea    Splenic vein  thrombosis    a.) treated with enoxaparin  BID x 6 months    Past Surgical History: Past Surgical History:  Procedure Laterality Date   BRONCHIAL WASHINGS N/A 08/21/2021   Procedure: BRONCHIAL WASHINGS;  Surgeon: Parris Manna, MD;  Location: [facility];  Service: Thoracic;  Laterality: N/A;   COLONOSCOPY     CORONARY PRESSURE/FFR STUDY N/A 12/07/2017   Procedure: INTRAVASCULAR PRESSURE WIRE/FFR STUDY;  Surgeon: Mady Bruckner, MD;  Location: [facility];  Service: Cardiovascular;   Laterality: N/A;   FLEXIBLE BRONCHOSCOPY N/A 08/21/2021   Procedure: FLEXIBLE BRONCHOSCOPY;  Surgeon: Parris Manna, MD;  Location: [facility];  Service: Thoracic;  Laterality: N/A;   HERNIA REPAIR     LEFT HEART CATH AND CORONARY ANGIOGRAPHY Left 12/07/2017   Procedure: LEFT HEART CATH AND CORONARY ANGIOGRAPHY;  Surgeon: Mady Bruckner, MD;  Location: [facility];  Service: Cardiovascular;  Laterality: Left;   nissen funduplication  2009   [name]   PULMONARY THROMBECTOMY N/A 03/29/2022   Procedure: PULMONARY THROMBECTOMY;  Surgeon: Marea Selinda RAMAN, MD;  Location: [facility];  Service: Cardiovascular;  Laterality: N/A;   SEPTOPLASTY Bilateral 08/23/2019   Procedure: SEPTOPLASTY;  Surgeon: Edda Mt, MD;  Location: [facility];  Service: ENT;  Laterality: Bilateral;   SHOULDER ARTHROSCOPY Left 02/22/2023   Procedure: ARTHROSCOPY SHOULDER;  Surgeon: Marchia Drivers, MD;  Location: [facility];  Service: Orthopedics;  Laterality: Left;   SHOULDER ARTHROSCOPY WITH OPEN ROTATOR CUFF REPAIR AND DISTAL CLAVICLE ACROMINECTOMY Left 02/22/2023   Procedure: SHOULDER ARTHROSCOPY WITH OPEN ROTATOR CUFF REPAIR, DISTAL CLAVICLE ACROMINECTOMY, SUBACROMIAL DECOMPRESSION, DISTAL CLAVICLE EXCISION, AND BICEPS TENODESIS;  Surgeon: Marchia Drivers, MD;  Location: [facility];  Service: Orthopedics;  Laterality: Left;   SMALL INTESTINE SURGERY  03/20/2022   SPLENECTOMY     TURBINATE REDUCTION Bilateral 08/23/2019   Procedure: TURBINATE REDUCTION;  Surgeon: Edda Mt, MD;  Location: [facility];  Service: ENT;  Laterality: Bilateral;  Latex sensitivity sleep apnea    Allergies: Allergies as of 01/01/2025 - Review Complete 12/05/2024  Allergen Reaction Noted   Avelox [moxifloxacin] Hives and Swelling 04/30/2014   Gadolinium derivatives Swelling 11/30/2017   Contrast media [iodinated contrast media] Swelling 11/30/2017   Crestor  [rosuvastatin  calcium ]  10/25/2011    Morphine and codeine Itching 10/25/2011   Niacin  10/25/2011   Niacin-simvastatin er  10/25/2011   Latex Rash 08/20/2019   Tape Dermatitis 09/03/2016   Tapentadol Dermatitis 09/03/2016   Wound dressing adhesive Dermatitis 09/03/2016    Medications: Current Medications[1]  Social History: Social History[2]  Family Medical History: Family History  Problem Relation Age of Onset   Coronary artery disease Father 65       CABG   Aortic aneurysm Father 60       repaired   Hyperlipidemia Father    Lung cancer Mother    Hypertension Mother    Cancer Mother        bladder   COPD Mother    Stomach cancer Maternal Grandfather     Physical Examination: There were no vitals filed for this visit.  General: Patient is in no apparent distress. Attention to examination is appropriate.  Neck:   Supple.  Full range of motion.  Respiratory: Patient is breathing without any difficulty.   NEUROLOGICAL:     Awake, alert, oriented to person, place, and time.  Speech is clear and fluent.   Cranial Nerves: Pupils equal round and reactive to light.  Facial tone is symmetric.  Facial sensation is symmetric. Shoulder shrug is symmetric. Tongue protrusion is midline.  There is no pronator drift.  Strength: Side Biceps Triceps Deltoid Interossei Grip Wrist Ext. Wrist Flex.  R 5 5 5 5 5 5 5   L 5 5 5 5 5 5 5    Side Iliopsoas Quads Hamstring PF DF EHL  R 5 5 5 5 5 5   L 5 5 5 5 5 5    Reflexes are ***2+ and symmetric at the biceps, triceps, brachioradialis, patella and achilles.   Hoffman's is absent.   Bilateral upper and lower extremity sensation is intact to light touch.    No evidence of dysmetria noted.  Gait is normal.     Medical Decision Making  Imaging: ***  I have personally reviewed the images and agree with the above interpretation.  Assessment and Plan: Mr. Agrusa is a pleasant 69 y.o. male with ***      Thank you for involving me in the care of this patient.       Chester K. Clois MD, [facility]surgery     [1]  Current Outpatient Medications:    Alirocumab  (PRALUENT ) 150 MG/ML SOAJ, Inject 1 mL (150 mg total) into the skin every 14 (fourteen) days., Disp: 2 mL, Rfl: 6   ALPRAZolam  (XANAX ) 0.5 MG tablet, Take 1 tablet (0.5 mg total) by mouth 2 (two) times daily as needed for anxiety., Disp: 60 tablet, Rfl: 0   carboxymethylcellulose (REFRESH PLUS) 0.5 % SOLN, Apply to eye as needed (dry eyes)., Disp: , Rfl:    carvedilol  (COREG ) 12.5 MG tablet, Take 1 tablet (12.5 mg total) by mouth daily AND 1.5 tablets (18.75 mg total) every evening. - with meals., Disp: 225 tablet, Rfl: 3   cyanocobalamin  (VITAMIN B12) 1000 MCG/ML injection, INJECT 1ML INTO THE MUSCLE EVERY 30 DAYS, Disp: 10 mL, Rfl: 0   ELIQUIS  5 MG TABS tablet, TAKE ONE TABLET BY MOUTH TWICE A DAY, Disp: 60 tablet, Rfl: 5   Eszopiclone  3 MG TABS, TAKE ONE TABLET BY MOUTH AT BEDTIME AS NEEDED, Disp: 15 tablet, Rfl: 2   ezetimibe  (ZETIA ) 10 MG tablet, Take 1 tablet (10 mg total) by mouth every morning., Disp: 90 tablet, Rfl: 3   famotidine  (PEPCID ) 20 MG tablet, Take 20 mg by mouth 2 (two) times daily., Disp: , Rfl:    fluorouracil  (EFUDEX ) 5 % cream, Apply topically 2 (two) times daily. Bid to left temple for 10 days, Disp: 30 g, Rfl: 0   furosemide  (LASIX ) 40 MG tablet, Take 40 mg by mouth as needed., Disp: , Rfl:    hydrochlorothiazide  (MICROZIDE ) 12.5 MG capsule, Take 1 capsule (12.5 mg total) by mouth daily., Disp: 90 capsule, Rfl: 3   irbesartan  (AVAPRO ) 150 MG tablet, Take 1 tablet (150 mg total) by mouth daily., Disp: 90 tablet, Rfl: 3   Multiple Vitamins-Minerals (MULTIVITAMIN WITH MINERALS) tablet, Take 1 tablet by mouth daily., Disp: , Rfl:    nitroGLYCERIN  (NITROSTAT ) 0.4 MG SL tablet, Place 1 tablet (0.4 mg total) under the tongue every 5 (five) minutes as needed for chest pain. Maximum of 3 doses., Disp: 25 tablet, Rfl: 10   omeprazole (PRILOSEC) 40 MG capsule, Take 40 mg by  mouth every morning., Disp: , Rfl:    ondansetron  (ZOFRAN ) 4 MG tablet, Take 1 tablet (4 mg total) by mouth every 8 (eight) hours as needed., Disp: 20 tablet, Rfl: 5   ONETOUCH VERIO test strip, USE TO CHECK BLOOD SUGAR AS NEEDED, Disp: 100 strip, Rfl:  2   predniSONE  (DELTASONE ) 10 MG tablet, 6 tablets daily for 3 days, then reduce by 1 tablet daily until gone, Disp: 33 tablet, Rfl: 0   pregabalin (LYRICA) 50 MG capsule, Take 50 mg by mouth 3 (three) times daily., Disp: , Rfl:    Prucalopride Succinate (MOTEGRITY) 2 MG TABS, Take 2 mg by mouth daily as needed (constipation)., Disp: , Rfl:    pyridostigmine (MESTINON) 60 MG tablet, Take 60 mg by mouth 3 (three) times daily., Disp: , Rfl:    rOPINIRole  (REQUIP ) 0.25 MG tablet, Take 0.25 mg by mouth in the morning and at bedtime. , Disp: , Rfl:    Syringe/Needle, Disp, (SYRINGE 3CC/25GX1) 25G X 1 3 ML MISC, Use for b12 injections, Disp: 50 each, Rfl: 0   tiZANidine  (ZANAFLEX ) 4 MG tablet, TAKE ONE-HALF TO ONE TABLET BY MOUTH AT BEDTIME AS NEEDED FOR MUSCLE SPASM, Disp: 30 tablet, Rfl: 2   triamcinolone  (NASACORT ) 55 MCG/ACT AERO nasal inhaler, Place 2 sprays into the nose every 8 (eight) hours as needed., Disp: , Rfl:    zolpidem  (AMBIEN  CR) 6.25 MG CR tablet, TAKE TWO TABLETS BY MOUTH AT BEDTIME AS NEEDED FOR SLEEP, Disp: 60 tablet, Rfl: 5 [2]  Social History Tobacco Use   Smoking status: Never    Passive exposure: Past   Smokeless tobacco: Never  Vaping Use   Vaping status: Never Used  Substance Use Topics   Alcohol use: No   Drug use: No   "

## 2024-12-28 ENCOUNTER — Other Ambulatory Visit: Payer: Self-pay | Admitting: Internal Medicine

## 2024-12-28 NOTE — Telephone Encounter (Signed)
 Noted

## 2025-01-01 ENCOUNTER — Ambulatory Visit: Admitting: Neurosurgery

## 2025-01-01 ENCOUNTER — Other Ambulatory Visit: Payer: Self-pay | Admitting: Internal Medicine

## 2025-01-01 ENCOUNTER — Ambulatory Visit: Admitting: Dermatology

## 2025-01-01 ENCOUNTER — Encounter: Payer: Self-pay | Admitting: Dermatology

## 2025-01-01 DIAGNOSIS — M5030 Other cervical disc degeneration, unspecified cervical region: Secondary | ICD-10-CM

## 2025-01-01 DIAGNOSIS — Z5111 Encounter for antineoplastic chemotherapy: Secondary | ICD-10-CM

## 2025-01-01 DIAGNOSIS — L570 Actinic keratosis: Secondary | ICD-10-CM | POA: Diagnosis not present

## 2025-01-01 DIAGNOSIS — M25512 Pain in left shoulder: Secondary | ICD-10-CM

## 2025-01-01 DIAGNOSIS — L578 Other skin changes due to chronic exposure to nonionizing radiation: Secondary | ICD-10-CM

## 2025-01-01 DIAGNOSIS — Z79899 Other long term (current) drug therapy: Secondary | ICD-10-CM

## 2025-01-01 DIAGNOSIS — W908XXA Exposure to other nonionizing radiation, initial encounter: Secondary | ICD-10-CM | POA: Diagnosis not present

## 2025-01-01 DIAGNOSIS — Z7189 Other specified counseling: Secondary | ICD-10-CM

## 2025-01-01 DIAGNOSIS — M19012 Primary osteoarthritis, left shoulder: Secondary | ICD-10-CM

## 2025-01-01 DIAGNOSIS — R519 Headache, unspecified: Secondary | ICD-10-CM

## 2025-01-01 DIAGNOSIS — M542 Cervicalgia: Secondary | ICD-10-CM

## 2025-01-01 NOTE — Patient Instructions (Addendum)
 Restart 5FU/Calcipotriene cream twice a day for 10 days on February 17, 2024  5-fluorouracil /calcipotriene cream is is a type of field treatment used to treat precancers, thin skin cancers, and areas of sun damage. Expected reaction includes irritation and mild inflammation potentially progressing to more severe inflammation including redness, scaling, crusting and open sores/erosions.  If too much irritation occurs, ensure application of only a thin layer and decrease frequency of use to achieve a tolerable level of inflammation. Recommend applying Vaseline ointment to open sores as needed.  Minimize sun exposure while under treatment. Recommend daily broad spectrum sunscreen SPF 30+ to sun-exposed areas, reapply every 2 hours as needed.         Due to recent changes in healthcare laws, you may see results of your pathology and/or laboratory studies on MyChart before the doctors have had a chance to review them. We understand that in some cases there may be results that are confusing or concerning to you. Please understand that not all results are received at the same time and often the doctors may need to interpret multiple results in order to provide you with the best plan of care or course of treatment. Therefore, we ask that you please give us  2 business days to thoroughly review all your results before contacting the office for clarification. Should we see a critical lab result, you will be contacted sooner.   If You Need Anything After Your Visit  If you have any questions or concerns for your doctor, please call our main line at [phone] and press option 4 to reach your doctor's medical assistant. If no one answers, please leave a voicemail as directed and we will return your call as soon as possible. Messages left after 4 pm will be answered the following business day.   You may also send us  a message via MyChart. We typically respond to MyChart messages within 1-2 business days.  For  prescription refills, please ask your pharmacy to contact our office. Our fax number is [phone].  If you have an urgent issue when the clinic is closed that cannot wait until the next business day, you can page your doctor at the number below.    Please note that while we do our best to be available for urgent issues outside of office hours, we are not available 24/7.   If you have an urgent issue and are unable to reach us , you may choose to seek medical care at your doctor's office, retail clinic, urgent care center, or emergency room.  If you have a medical emergency, please immediately call 911 or go to the emergency department.  Pager Numbers  - Dr. Hester: [phone]  - Dr. Jackquline: [phone]  - Dr. Claudene: [phone]   - Dr. Raymund: [phone]  In the event of inclement weather, please call our main line at [phone] for an update on the status of any delays or closures.  Dermatology Medication Tips: Please keep the boxes that topical medications come in in order to help keep track of the instructions about where and how to use these. Pharmacies typically print the medication instructions only on the boxes and not directly on the medication tubes.   If your medication is too expensive, please contact our office at [phone] option 4 or send us  a message through MyChart.   We are unable to tell what your co-pay for medications will be in advance as this is different depending on your insurance coverage. However, we may be able to  find a substitute medication at lower cost or fill out paperwork to get insurance to cover a needed medication.   If a prior authorization is required to get your medication covered by your insurance company, please allow us  1-2 business days to complete this process.  Drug prices often vary depending on where the prescription is filled and some pharmacies may offer cheaper prices.  The website www.goodrx.com contains coupons for  medications through different pharmacies. The prices here do not account for what the cost may be with help from insurance (it may be cheaper with your insurance), but the website can give you the price if you did not use any insurance.  - You can print the associated coupon and take it with your prescription to the pharmacy.  - You may also stop by our office during regular business hours and pick up a GoodRx coupon card.  - If you need your prescription sent electronically to a different pharmacy, notify our office through [facility] or by phone at [phone] option 4.     Si Usted Necesita Algo Despus de Su Visita  Tambin puede enviarnos un mensaje a travs de Clinical Cytogeneticist. Por lo general respondemos a los mensajes de MyChart en el transcurso de 1 a 2 das hbiles.  Para renovar recetas, por favor pida a su farmacia que se ponga en contacto con nuestra oficina. Randi lakes de fax es [place] [phone].  Si tiene un asunto urgente cuando la clnica est cerrada y que no puede esperar hasta el siguiente da hbil, puede llamar/localizar a su doctor(a) al nmero que aparece a continuacin.   Por favor, tenga en cuenta que aunque hacemos todo lo posible para estar disponibles para asuntos urgentes fuera del horario de [place], no estamos disponibles las 24 horas del da, los 7 [address]  [address] de la [place].   Si tiene un problema urgente y no puede comunicarse con nosotros, puede optar por buscar atencin mdica  en el consultorio de su doctor(a), en una clnica privada, en un centro de atencin urgente o en una sala de emergencias.  Si tiene engineer, drilling, por favor llame inmediatamente al 911 o vaya a la sala de emergencias.  Nmeros de bper  - Dr. Hester: [phone]  - Dra. Jackquline: [phone]  - Dr. Claudene: [phone]  - Dra. Kitts: [phone]  En caso de inclemencias del [place], por favor llame a nuestra lnea principal al [phone] para una actualizacin sobre el  estado de cualquier retraso o cierre.  Consejos para la medicacin en dermatologa: Por favor, guarde las cajas en las que vienen los medicamentos de uso tpico para ayudarle a seguir las instrucciones sobre dnde y cmo usarlos. Las farmacias generalmente imprimen las instrucciones del medicamento slo en las cajas y no directamente en los tubos del [place].   Si su medicamento es muy caro, por favor, pngase en contacto con landry rieger llamando al [phone] y presione la opcin 4 o envenos un mensaje a travs de Clinical Cytogeneticist.   No podemos decirle cul ser su copago por los medicamentos por adelantado ya que esto es diferente dependiendo de la cobertura de su seguro. Sin embargo, es posible que podamos encontrar un medicamento sustituto a audiological scientist un formulario para que el seguro cubra el medicamento que se considera necesario.   Si se requiere una autorizacin previa para que su compaa de seguros cubra su medicamento, por favor permtanos de 1 a 2 das hbiles para completar este proceso.  Los precios de los  medicamentos varan con frecuencia dependiendo del lugar de dnde se surte la receta y alguna farmacias pueden ofrecer precios ms baratos.  El sitio web www.goodrx.com tiene cupones para medicamentos de health and safety inspector. Los precios aqu no tienen en cuenta lo que podra costar con la ayuda del seguro (puede ser ms barato con su seguro), pero el sitio web puede darle el precio si no utiliz tourist information centre manager.  - Puede imprimir el cupn correspondiente y llevarlo con su receta a la farmacia.  - Tambin puede pasar por nuestra oficina durante el horario de atencin regular y education officer, museum una tarjeta de cupones de GoodRx.  - Si necesita que su receta se enve electrnicamente a una farmacia diferente, informe a nuestra oficina a travs de MyChart de [facility] o por telfono llamando al [phone] y presione la opcin 4.

## 2025-01-01 NOTE — Progress Notes (Unsigned)
" ° °  Follow-Up Visit   Subjective  Brian Atkins is a 69 y.o. male who presents for the following: AK 39m f/u, L temple bx proven 07/11/2015, LN2 05/10/24, 09/04/2024 pt used 5FU/Calcipotriene cr bid for 10days, pt had good reactions  The following portions of the chart were reviewed this encounter and updated as appropriate: medications, allergies, medical history  Review of Systems:  No other skin or systemic complaints except as noted in HPI or Assessment and Plan.  Objective  Well appearing patient in no apparent distress; mood and affect are within normal limits.   A focused examination was performed of the following areas: face  Relevant exam findings are noted in the Assessment and Plan.  L temple x 1 Residual scale around edge     Assessment & Plan  AK (ACTINIC KERATOSIS) L temple x 1 Bx proven 07/11/2015 at [facility] by Adela Eleanora Hal Charter, MD,  LN2 05/10/24, 09/04/24  Restart 5FU/Calcipotriene bid for 10 days on February 17, 2024  Actinic keratoses are precancerous spots that appear secondary to cumulative UV radiation exposure/sun exposure over time. They are chronic with expected duration over 1 year. A portion of actinic keratoses will progress to squamous cell carcinoma of the skin. It is not possible to reliably predict which spots will progress to skin cancer and so treatment is recommended to prevent development of skin cancer.  ACTINIC DAMAGE - chronic, secondary to cumulative UV radiation exposure/sun exposure over time - diffuse scaly erythematous macules with underlying dyspigmentation - Recommend daily broad spectrum sunscreen SPF 30+ to sun-exposed areas, reapply every 2 hours as needed.  - Recommend staying in the shade or wearing long sleeves, sun glasses (UVA+UVB protection) and wide brim hats (4-inch brim around the entire circumference of the hat). - Call for new or changing lesions.  Recommend daily broad spectrum sunscreen SPF 30+ to sun-exposed  areas, reapply every 2 hours as needed.  Recommend staying in the shade or wearing long sleeves, sun glasses (UVA+UVB protection) and wide brim hats (4-inch brim around the entire circumference of the hat). Call for new or changing lesions. - Destruction of lesion - L temple x 1 Complexity: simple   Destruction method: cryotherapy   Informed consent: discussed and consent obtained   Timeout:  patient name, date of birth, surgical site, and procedure verified Lesion destroyed using liquid nitrogen: Yes   Region frozen until ice ball extended beyond lesion: Yes   Outcome: patient tolerated procedure well with no complications   Post-procedure details: wound care instructions given    Existing Treatments - fluorouracil  (EFUDEX ) 5 % cream - Apply topically 2 (two) times daily. Bid to left temple for 10 days CHEMOTHERAPY MANAGEMENT, ENCOUNTER FOR   COUNSELING AND COORDINATION OF CARE   MEDICATION MANAGEMENT   ACTINIC SKIN DAMAGE    Return in about 4 months (around 05/01/2025) for recheck Ak L temple.  I, Grayce Saunas, RMA, am acting as scribe for Alm Rhyme, MD .   Documentation: I have reviewed the above documentation for accuracy and completeness, and I agree with the above.  Alm Rhyme, MD    "

## 2025-01-02 ENCOUNTER — Ambulatory Visit

## 2025-01-03 ENCOUNTER — Inpatient Hospital Stay
Admission: RE | Admit: 2025-01-03 | Discharge: 2025-01-03 | Disposition: A | Payer: Self-pay | Source: Ambulatory Visit | Attending: Neurosurgery | Admitting: Neurosurgery

## 2025-01-03 ENCOUNTER — Other Ambulatory Visit: Payer: Self-pay

## 2025-01-03 DIAGNOSIS — Z049 Encounter for examination and observation for unspecified reason: Secondary | ICD-10-CM

## 2025-01-11 NOTE — Progress Notes (Signed)
 "   Referring Physician:  Marylynn Verneita CROME, MD [address] [place],  [place] [address]  Primary Physician:  Marylynn Verneita CROME, MD  History of Present Illness: 01/17/2025 Brian Atkins is here today with a chief complaint of lumbar radiculopathy, degenerative disc disease, and neuropathy who presents for neurosurgical evaluation of chronic back and leg pain.  Over the past 6-9 months he has had gradually worsening low back pain with radiation down both legs. About 2 months ago he developed severe acute back pain that awoke him from sleep, was intense enough to limit driving, and required multiple doses of oxycodone  and acetaminophen  for partial relief. He did not seek emergency care. Since then he has had persistent back pain and new headaches.  He has had two injection series at [facility] and physical therapy. The second injection provided partial relief and allowed him to stop oxycodone . He continues acetaminophen  for pain but cannot use NSAIDs due to cancer history, anticoagulation, and prior thromboembolic events. Core exercises and PT have not adequately controlled symptoms. A pain management referral was made, but he had an acute exacerbation before being seen.  Pain worsens with standing, movement, and prolonged sitting, especially in uncomfortable work archivist. He often lies down at lunch for relief. After standing 30-45 minutes he develops increased leg symptoms and must sit. He notes numbness and paresthesia, worst in the right foot, with slick, tingling toes and plantar surface. Leg symptoms have been intermittent for 6-9 months. He recently fell on ice, which he attributes to the environment rather than weakness.  He was told an EMG showed peripheral neuropathy. Prior trials of gabapentin and pregabalin were stopped because of dizziness and altered mental status. He has not had similar back or leg pain before this 6-9 month period.   Discussed the use of AI scribe software  for clinical note transcription with the patient, who gave verbal consent to proceed.  Duration: 2 months  Timing: constant Bowel/Bladder Dysfunction: none  Conservative measures:  Physical therapy:  has participated in at [facility] from 02/07/24 to 04/09/24 (8 visits total), not helpful Multimodal medical therapy including regular antiinflammatories:   tizanidine , lyrica, prednisone , oxycodone   Injections:   Lumbar ESI at [facility]  Past Surgery: no spinal surgeries   Brian Atkins has no symptoms of cervical myelopathy.  The symptoms are causing a significant impact on the patient's life.   I have utilized the care everywhere function in epic to review the outside records available from external health systems.   Review of Systems:  A 10 point review of systems is negative, except for the pertinent positives and negatives detailed in the HPI.  Past Medical History: Past Medical History:  Diagnosis Date   Complication of anesthesia    a.) delayed emergence   Coronary artery disease, non-occlusive    a. 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)    a.) stage IV with mets to orbits and spleen; Tx'd with splenectomy + XRT + chemotherapy  with associated angioedema reaction requiring intubation (06/27/2014 - 07/02/2014)   Family history  of adverse reaction to anesthesia    a.) delayed emergence in 1st degree relative (mother)   GERD (gastroesophageal reflux disease)    Hemorrhoids    Hiatal hernia    History of 2019 novel coronavirus disease (COVID-19) 12/24/2019   History of 2019 novel coronavirus  disease (COVID-19) 12/24/2019   History of kidney stones    Hx of splenectomy    a.) secondary MALT lymphoma   Idiopathic angioedema    a.) etiology felt to be related to XRT; two hours after receiving bilateral orbital radiation patient was noted to have increased swelling of the orbital area which progressed to include the cheek and lips, an intense itching of the eyes bilaterally;  reaction resulted in intubation (06/27/2014 - 07/02/2014)   Insomnia    a.) on prescribed hypnotics (zolpidem  + eszopiclone )   Labile hypertension    Left rotator cuff tear    Long term current use of anticoagulant    a.) apixaban    Medial epicondylitis of right elbow    Meralgia paresthetica of right side    Microscopic hematuria    Myalgia due to statin    Ocular migraine    OSA on CPAP    Pneumonia    Prediabetes    PSVT (paroxysmal supraventricular tachycardia)    Pulmonary emboli (HCC) 03/28/2022   a. assoicated (+) RIGHT heart strain; required mechanical thrombectomy   RLS (restless legs syndrome)    a.) on ropinirole    Sleep apnea    Splenic vein thrombosis    a.) treated with enoxaparin  BID x 6 months    Past Surgical History: Past Surgical History:  Procedure Laterality Date   BRONCHIAL WASHINGS N/A 08/21/2021   Procedure: BRONCHIAL WASHINGS;  Surgeon: Parris Manna, MD;  Location: [facility];  Service: Thoracic;  Laterality: N/A;   COLONOSCOPY     CORONARY PRESSURE/FFR STUDY N/A 12/07/2017   Procedure: INTRAVASCULAR PRESSURE WIRE/FFR STUDY;  Surgeon: Mady Bruckner, MD;  Location: [facility];  Service: Cardiovascular;  Laterality: N/A;   FLEXIBLE BRONCHOSCOPY N/A 08/21/2021   Procedure: FLEXIBLE BRONCHOSCOPY;  Surgeon: Parris Manna, MD;  Location: [facility];  Service: Thoracic;  Laterality: N/A;   HERNIA REPAIR     LEFT HEART CATH AND CORONARY ANGIOGRAPHY Left 12/07/2017   Procedure: LEFT HEART CATH AND CORONARY ANGIOGRAPHY;  Surgeon: Mady Bruckner, MD;  Location:  [facility];  Service: Cardiovascular;  Laterality: Left;   nissen funduplication  2009   [name]   PULMONARY THROMBECTOMY N/A 03/29/2022   Procedure: PULMONARY THROMBECTOMY;  Surgeon: Marea Selinda RAMAN, MD;  Location: [facility];  Service: Cardiovascular;  Laterality: N/A;   SEPTOPLASTY Bilateral 08/23/2019   Procedure: SEPTOPLASTY;  Surgeon: Edda Mt, MD;  Location: [facility];  Service: ENT;  Laterality: Bilateral;   SHOULDER ARTHROSCOPY Left 02/22/2023   Procedure: ARTHROSCOPY SHOULDER;  Surgeon: Marchia Drivers, MD;  Location: [facility];  Service: Orthopedics;  Laterality: Left;   SHOULDER ARTHROSCOPY WITH OPEN ROTATOR CUFF REPAIR AND DISTAL CLAVICLE ACROMINECTOMY Left 02/22/2023   Procedure: SHOULDER ARTHROSCOPY WITH OPEN ROTATOR CUFF REPAIR, DISTAL CLAVICLE ACROMINECTOMY, SUBACROMIAL DECOMPRESSION, DISTAL CLAVICLE EXCISION, AND BICEPS TENODESIS;  Surgeon: Marchia Drivers, MD;  Location: [facility];  Service: Orthopedics;  Laterality: Left;   SMALL INTESTINE SURGERY  03/20/2022   SPLENECTOMY     TURBINATE REDUCTION Bilateral 08/23/2019   Procedure: TURBINATE REDUCTION;  Surgeon: Edda Mt, MD;  Location: [facility];  Service: ENT;  Laterality: Bilateral;  Latex sensitivity sleep apnea  Allergies: Allergies as of 01/17/2025 - Review Complete 01/17/2025  Allergen Reaction Noted   Avelox [moxifloxacin] Hives and Swelling 04/30/2014   Gadolinium derivatives Swelling 11/30/2017   Contrast media [iodinated contrast media] Swelling 11/30/2017   Crestor  [rosuvastatin  calcium ]  10/25/2011   Morphine and codeine Itching 10/25/2011   Niacin  10/25/2011   Niacin-simvastatin er  10/25/2011   Latex Rash 08/20/2019   Tape Dermatitis 09/03/2016   Tapentadol Dermatitis 09/03/2016   Wound dressing adhesive Dermatitis 09/03/2016    Medications: Current Medications[1]  Social History: Social History[2]  Family Medical History: Family History   Problem Relation Age of Onset   Coronary artery disease Father 29       CABG   Aortic aneurysm Father 25       repaired   Hyperlipidemia Father    Lung cancer Mother    Hypertension Mother    Cancer Mother        bladder   COPD Mother    Stomach cancer Maternal Grandfather     Physical Examination: Vitals:   01/17/25 0903  BP: 122/78    General: Patient is in no apparent distress. Attention to examination is appropriate.  Neck:   Supple.  Full range of motion.  Respiratory: Patient is breathing without any difficulty.   NEUROLOGICAL:     Awake, alert, oriented to person, place, and time.  Speech is clear and fluent.   Cranial Nerves: Pupils equal round and reactive to light.  Facial tone is symmetric.  Facial sensation is symmetric. Shoulder shrug is symmetric. Tongue protrusion is midline.  There is no pronator drift.  Strength: Side Biceps Triceps Deltoid Interossei Grip Wrist Ext. Wrist Flex.  R 5 5 5 5 5 5 5   L 5 5 5 5 5 5 5    Side Iliopsoas Quads Hamstring PF DF EHL  R 5 5 5 5 5 5   L 5 5 5 5 5 5    Reflexes are 1+ and symmetric at the biceps, triceps, brachioradialis, patella and achilles.   Hoffman's is absent.   Bilateral upper and lower extremity sensation is intact to light touch.    No evidence of dysmetria noted.  Gait is normal.     Medical Decision Making  Imaging: MRI L spine 11/20/2024 L4-5 biforaminal protrusions more prominent on the left with mild left greater than right foraminal narrowing.  Mild biforaminal narrowing at L5-S1.  S1-2 broad-based bulging disc abutting the descending S2 nerve roots bilaterally.  Disc osteophyte complex encroaches into a mildly narrows the right foramen and abuts the right S1 nerve root.  I have personally reviewed the images.  I disagree with the radiologist assessment.  He has no significant impingement of any of his nerve roots.   Assessment and Plan: Mr. Laduca is a pleasant 69 y.o. male with low back pain  with bilateral lower extremity pain.  He also has peripheral neuropathy.  He has significant spondylosis that may cause back pain.  He has no evidence of instability on his flexion-extension x-rays.  He was evaluated by an outside physician who felt that he had spondylolisthesis.  He has no significant movement on flexion-extension x-rays, and has no significant spondylolisthesis at any level.  I think he may be a good candidate for spinal cord stimulator evaluation.  This could help treat his back pain and his peripheral neuropathy which was previously confirmed on nerve conduction study testing.  He has tried gabapentin without effect.  He will review information about spinal cord stimulator  evaluation.  He will let me know if he would like to pursue this.  If he does decide to move forward, he will need thoracic spine MRI, psychology evaluation, and referral to pain management for stimulator evaluation.  I spent a total of 30 minutes in this patient's care today. This time was spent reviewing pertinent records including imaging studies, obtaining and confirming history, performing a directed evaluation, formulating and discussing my recommendations, and documenting the visit within the medical record.    Thank you for involving me in the care of this patient.      Jazzma Neidhardt K. Clois MD, [facility] Neurosurgery     [1]  Current Outpatient Medications:    Alirocumab  (PRALUENT ) 150 MG/ML SOAJ, Inject 1 mL (150 mg total) into the skin every 14 (fourteen) days., Disp: 2 mL, Rfl: 6   carboxymethylcellulose (REFRESH PLUS) 0.5 % SOLN, Apply to eye as needed (dry eyes)., Disp: , Rfl:    carvedilol  (COREG ) 12.5 MG tablet, Take 1 tablet (12.5 mg total) by mouth daily AND 1.5 tablets (18.75 mg total) every evening. - with meals., Disp: 225 tablet, Rfl: 3   cyanocobalamin  (VITAMIN B12) 1000 MCG/ML injection, INJECT 1ML INTO THE MUSCLE EVERY 30 DAYS, Disp: 10 mL, Rfl: 0   ELIQUIS  5 MG TABS tablet, TAKE  ONE TABLET BY MOUTH TWICE A DAY, Disp: 60 tablet, Rfl: 5   Eszopiclone  3 MG TABS, TAKE ONE TABLET BY MOUTH AT BEDTIME AS NEEDED, Disp: 15 tablet, Rfl: 5   ezetimibe  (ZETIA ) 10 MG tablet, Take 1 tablet (10 mg total) by mouth every morning., Disp: 90 tablet, Rfl: 3   famotidine  (PEPCID ) 20 MG tablet, Take 20 mg by mouth 2 (two) times daily., Disp: , Rfl:    fluorouracil  (EFUDEX ) 5 % cream, Apply topically 2 (two) times daily. Bid to left temple for 10 days, Disp: 30 g, Rfl: 0   hydrochlorothiazide  (MICROZIDE ) 12.5 MG capsule, Take 1 capsule (12.5 mg total) by mouth daily., Disp: 90 capsule, Rfl: 3   irbesartan  (AVAPRO ) 150 MG tablet, Take 1 tablet (150 mg total) by mouth daily., Disp: 90 tablet, Rfl: 3   Multiple Vitamins-Minerals (MULTIVITAMIN WITH MINERALS) tablet, Take 1 tablet by mouth daily., Disp: , Rfl:    nitroGLYCERIN  (NITROSTAT ) 0.4 MG SL tablet, Place 1 tablet (0.4 mg total) under the tongue every 5 (five) minutes as needed for chest pain. Maximum of 3 doses., Disp: 25 tablet, Rfl: 10   ondansetron  (ZOFRAN ) 4 MG tablet, Take 1 tablet (4 mg total) by mouth every 8 (eight) hours as needed., Disp: 20 tablet, Rfl: 5   ONETOUCH VERIO test strip, USE TO CHECK BLOOD SUGAR AS NEEDED, Disp: 100 strip, Rfl: 2   Prucalopride Succinate (MOTEGRITY) 2 MG TABS, Take 2 mg by mouth daily as needed (constipation)., Disp: , Rfl:    pyridostigmine (MESTINON) 60 MG tablet, Take 60 mg by mouth 3 (three) times daily., Disp: , Rfl:    rOPINIRole  (REQUIP ) 0.25 MG tablet, Take 0.25 mg by mouth in the morning and at bedtime. , Disp: , Rfl:    Syringe/Needle, Disp, (SYRINGE 3CC/25GX1) 25G X 1 3 ML MISC, Use for b12 injections, Disp: 50 each, Rfl: 0   tiZANidine  (ZANAFLEX ) 4 MG tablet, TAKE ONE-HALF TO ONE TABLET BY MOUTH AT BEDTIME AS NEEDED FOR MUSCLE SPASM, Disp: 30 tablet, Rfl: 2   triamcinolone  (NASACORT ) 55 MCG/ACT AERO nasal inhaler, Place 2 sprays into the nose every 8 (eight) hours as needed., Disp: , Rfl:  zolpidem  (AMBIEN  CR) 6.25 MG CR tablet, TAKE TWO TABLETS BY MOUTH AT BEDTIME AS NEEDED FOR SLEEP, Disp: 60 tablet, Rfl: 5 [2]  Social History Tobacco Use   Smoking status: Never    Passive exposure: Past   Smokeless tobacco: Never  Vaping Use   Vaping status: Never Used  Substance Use Topics   Alcohol use: No   Drug use: No   "

## 2025-01-17 ENCOUNTER — Ambulatory Visit: Admitting: Neurosurgery

## 2025-01-17 VITALS — BP 122/78 | Ht 74.0 in | Wt 206.0 lb

## 2025-01-17 DIAGNOSIS — G629 Polyneuropathy, unspecified: Secondary | ICD-10-CM | POA: Diagnosis not present

## 2025-01-17 DIAGNOSIS — G8929 Other chronic pain: Secondary | ICD-10-CM | POA: Diagnosis not present

## 2025-01-17 DIAGNOSIS — M4726 Other spondylosis with radiculopathy, lumbar region: Secondary | ICD-10-CM | POA: Diagnosis not present

## 2025-01-17 DIAGNOSIS — G609 Hereditary and idiopathic neuropathy, unspecified: Secondary | ICD-10-CM

## 2025-02-05 ENCOUNTER — Ambulatory Visit

## 2025-02-20 ENCOUNTER — Ambulatory Visit

## 2025-04-30 ENCOUNTER — Ambulatory Visit: Admitting: Physician Assistant

## 2025-05-15 ENCOUNTER — Ambulatory Visit: Admitting: Dermatology
